# Patient Record
Sex: Male | Born: 1955 | Race: Black or African American | Hispanic: No | Marital: Single | Smoking: Current every day smoker
Health system: Southern US, Community
[De-identification: ages and names within clinical notes are randomized; demographics above are authoritative.]

## PROBLEM LIST (undated history)

## (undated) DIAGNOSIS — F39 Unspecified mood [affective] disorder: Secondary | ICD-10-CM

## (undated) DIAGNOSIS — K219 Gastro-esophageal reflux disease without esophagitis: Secondary | ICD-10-CM

## (undated) DIAGNOSIS — G039 Meningitis, unspecified: Secondary | ICD-10-CM

## (undated) DIAGNOSIS — F4321 Adjustment disorder with depressed mood: Secondary | ICD-10-CM

## (undated) DIAGNOSIS — R195 Other fecal abnormalities: Principal | ICD-10-CM

## (undated) DIAGNOSIS — F1010 Alcohol abuse, uncomplicated: Secondary | ICD-10-CM

## (undated) DIAGNOSIS — B459 Cryptococcosis, unspecified: Secondary | ICD-10-CM

## (undated) DIAGNOSIS — I10 Essential (primary) hypertension: Secondary | ICD-10-CM

## (undated) DIAGNOSIS — R64 Cachexia: Secondary | ICD-10-CM

## (undated) DIAGNOSIS — K649 Unspecified hemorrhoids: Secondary | ICD-10-CM

## (undated) DIAGNOSIS — B20 Human immunodeficiency virus [HIV] disease: Secondary | ICD-10-CM

## (undated) DIAGNOSIS — Z21 Asymptomatic human immunodeficiency virus [HIV] infection status: Secondary | ICD-10-CM

## (undated) DIAGNOSIS — R911 Solitary pulmonary nodule: Secondary | ICD-10-CM

## (undated) DIAGNOSIS — R52 Pain, unspecified: Secondary | ICD-10-CM

## (undated) DIAGNOSIS — B1920 Unspecified viral hepatitis C without hepatic coma: Secondary | ICD-10-CM

## (undated) DIAGNOSIS — K6289 Other specified diseases of anus and rectum: Secondary | ICD-10-CM

## (undated) DIAGNOSIS — F1910 Other psychoactive substance abuse, uncomplicated: Secondary | ICD-10-CM

## (undated) DIAGNOSIS — IMO0002 Reserved for concepts with insufficient information to code with codable children: Secondary | ICD-10-CM

## (undated) DIAGNOSIS — R21 Rash and other nonspecific skin eruption: Secondary | ICD-10-CM

## (undated) DIAGNOSIS — E291 Testicular hypofunction: Secondary | ICD-10-CM

## (undated) DIAGNOSIS — Z923 Personal history of irradiation: Secondary | ICD-10-CM

## (undated) DIAGNOSIS — C801 Malignant (primary) neoplasm, unspecified: Secondary | ICD-10-CM

## (undated) HISTORY — DX: Personal history of irradiation: Z92.3

## (undated) HISTORY — DX: Cryptococcosis, unspecified: B45.9

## (undated) HISTORY — DX: Unspecified viral hepatitis C without hepatic coma: B19.20

## (undated) HISTORY — DX: Unspecified hemorrhoids: K64.9

## (undated) HISTORY — DX: Other specified diseases of anus and rectum: K62.89

## (undated) HISTORY — DX: Human immunodeficiency virus (HIV) disease: B20

## (undated) HISTORY — DX: Unspecified mood (affective) disorder: F39

## (undated) HISTORY — DX: Solitary pulmonary nodule: R91.1

## (undated) HISTORY — DX: Testicular hypofunction: E29.1

## (undated) HISTORY — DX: Other psychoactive substance abuse, uncomplicated: F19.10

## (undated) HISTORY — DX: Alcohol abuse, uncomplicated: F10.10

## (undated) HISTORY — DX: Rash and other nonspecific skin eruption: R21

## (undated) HISTORY — DX: Adjustment disorder with depressed mood: F43.21

## (undated) HISTORY — DX: Meningitis, unspecified: G03.9

## (undated) HISTORY — DX: Reserved for concepts with insufficient information to code with codable children: IMO0002

## (undated) HISTORY — DX: Pain, unspecified: R52

## (undated) HISTORY — DX: Gastro-esophageal reflux disease without esophagitis: K21.9

## (undated) HISTORY — DX: Other fecal abnormalities: R19.5

## (undated) HISTORY — DX: Cachexia: R64

---

## 1999-08-24 ENCOUNTER — Other Ambulatory Visit: Admission: RE | Admit: 1999-08-24 | Discharge: 1999-08-24 | Payer: Self-pay | Admitting: *Deleted

## 2000-12-09 ENCOUNTER — Emergency Department (HOSPITAL_COMMUNITY): Admission: EM | Admit: 2000-12-09 | Discharge: 2000-12-09 | Payer: Self-pay | Admitting: Emergency Medicine

## 2000-12-09 ENCOUNTER — Encounter: Payer: Self-pay | Admitting: Emergency Medicine

## 2003-10-13 ENCOUNTER — Encounter (INDEPENDENT_AMBULATORY_CARE_PROVIDER_SITE_OTHER): Payer: Self-pay | Admitting: *Deleted

## 2003-10-13 LAB — CONVERTED CEMR LAB: CD4 Count: 210 microliters

## 2004-10-12 ENCOUNTER — Ambulatory Visit (HOSPITAL_COMMUNITY): Admission: RE | Admit: 2004-10-12 | Discharge: 2004-10-12 | Payer: Self-pay | Admitting: Infectious Diseases

## 2004-10-12 ENCOUNTER — Ambulatory Visit: Payer: Self-pay | Admitting: Infectious Diseases

## 2004-10-12 ENCOUNTER — Encounter (INDEPENDENT_AMBULATORY_CARE_PROVIDER_SITE_OTHER): Payer: Self-pay | Admitting: *Deleted

## 2004-10-12 LAB — CONVERTED CEMR LAB: HIV 1 RNA Quant: 97100 copies/mL

## 2004-10-25 ENCOUNTER — Ambulatory Visit: Payer: Self-pay | Admitting: Infectious Diseases

## 2004-11-17 ENCOUNTER — Ambulatory Visit: Payer: Self-pay | Admitting: Infectious Diseases

## 2005-04-06 ENCOUNTER — Ambulatory Visit: Payer: Self-pay | Admitting: Infectious Diseases

## 2005-04-06 ENCOUNTER — Encounter (INDEPENDENT_AMBULATORY_CARE_PROVIDER_SITE_OTHER): Payer: Self-pay | Admitting: *Deleted

## 2005-04-06 ENCOUNTER — Ambulatory Visit (HOSPITAL_COMMUNITY): Admission: RE | Admit: 2005-04-06 | Discharge: 2005-04-06 | Payer: Self-pay | Admitting: Infectious Diseases

## 2005-04-06 LAB — CONVERTED CEMR LAB
CD4 Count: 350 microliters
HIV 1 RNA Quant: 399 copies/mL

## 2005-04-07 ENCOUNTER — Encounter: Payer: Self-pay | Admitting: Infectious Diseases

## 2006-07-11 ENCOUNTER — Encounter (INDEPENDENT_AMBULATORY_CARE_PROVIDER_SITE_OTHER): Payer: Self-pay | Admitting: *Deleted

## 2006-07-11 ENCOUNTER — Ambulatory Visit: Payer: Self-pay | Admitting: Infectious Diseases

## 2006-07-11 ENCOUNTER — Encounter: Admission: RE | Admit: 2006-07-11 | Discharge: 2006-07-11 | Payer: Self-pay | Admitting: Infectious Diseases

## 2006-07-11 LAB — CONVERTED CEMR LAB
ALT: 47 units/L (ref 0–53)
AST: 76 units/L — ABNORMAL HIGH (ref 0–37)
Albumin: 3.7 g/dL (ref 3.5–5.2)
Alkaline Phosphatase: 62 units/L (ref 39–117)
BUN: 12 mg/dL (ref 6–23)
Basophils Absolute: 0 10*3/uL (ref 0.0–0.1)
Basophils Relative: 1 % (ref 0–1)
CD4 Count: 210 microliters
CO2: 26 meq/L (ref 19–32)
Calcium: 9.2 mg/dL (ref 8.4–10.5)
Chloride: 106 meq/L (ref 96–112)
Cholesterol: 133 mg/dL (ref 0–200)
Creatinine, Ser: 0.83 mg/dL (ref 0.40–1.50)
Eosinophils Relative: 2 % (ref 0–4)
Glucose, Bld: 94 mg/dL (ref 70–99)
HCT: 47.3 % (ref 41.0–49.0)
HDL: 49 mg/dL (ref >39)
HIV 1 RNA Quant: 29700 copies/mL
HIV 1 RNA Quant: 29700 copies/mL — ABNORMAL HIGH (ref <50)
HIV-1 RNA Quant, Log: 4.47 — ABNORMAL HIGH (ref <1.70)
Hemoglobin: 15.9 g/dL (ref 13.9–16.8)
LDL Cholesterol: 67 mg/dL (ref 0–99)
Lymphocytes Relative: 50 % — ABNORMAL HIGH (ref 15–43)
Lymphs Abs: 1.6 10*3/uL (ref 0.8–3.1)
MCHC: 33.6 g/dL (ref 33.1–35.4)
MCV: 87.4 fL (ref 78.8–100.0)
Monocytes Absolute: 0.5 10*3/uL (ref 0.2–0.7)
Monocytes Relative: 14 % — ABNORMAL HIGH (ref 3–11)
Neutro Abs: 1.1 10*3/uL — ABNORMAL LOW (ref 1.8–6.8)
Neutrophils Relative %: 34 % — ABNORMAL LOW (ref 47–77)
Platelets: 262 10*3/uL (ref 152–374)
Potassium: 4.4 meq/L (ref 3.5–5.3)
RBC: 5.41 M/uL (ref 4.20–5.50)
RDW: 14.4 % (ref 11.5–15.3)
Sodium: 138 meq/L (ref 135–145)
Total Bilirubin: 0.4 mg/dL (ref 0.3–1.2)
Total CHOL/HDL Ratio: 2.7
Total Protein: 8.7 g/dL — ABNORMAL HIGH (ref 6.0–8.3)
Triglycerides: 85 mg/dL (ref <150)
VLDL: 17 mg/dL (ref 0–40)
WBC: 3.3 10*3/uL — ABNORMAL LOW (ref 3.7–10.0)

## 2006-08-18 DIAGNOSIS — B20 Human immunodeficiency virus [HIV] disease: Secondary | ICD-10-CM | POA: Insufficient documentation

## 2006-08-18 DIAGNOSIS — B1710 Acute hepatitis C without hepatic coma: Secondary | ICD-10-CM | POA: Insufficient documentation

## 2006-08-18 DIAGNOSIS — F1910 Other psychoactive substance abuse, uncomplicated: Secondary | ICD-10-CM | POA: Insufficient documentation

## 2006-08-30 ENCOUNTER — Ambulatory Visit: Payer: Self-pay | Admitting: Infectious Diseases

## 2006-10-01 ENCOUNTER — Encounter (INDEPENDENT_AMBULATORY_CARE_PROVIDER_SITE_OTHER): Payer: Self-pay | Admitting: *Deleted

## 2006-10-01 LAB — CONVERTED CEMR LAB: HCV Quantitative: 4750000 intl units/mL

## 2007-08-06 ENCOUNTER — Encounter (INDEPENDENT_AMBULATORY_CARE_PROVIDER_SITE_OTHER): Payer: Self-pay | Admitting: *Deleted

## 2008-08-11 ENCOUNTER — Emergency Department (HOSPITAL_COMMUNITY): Admission: EM | Admit: 2008-08-11 | Discharge: 2008-08-11 | Payer: Self-pay | Admitting: Emergency Medicine

## 2008-08-11 IMAGING — CR DG CHEST 2V
2 series · 2 of 2 positions shown · non-contrast
Comparison: None

CLINICAL DATA: Short of breath, cough, vomiting, history hepatitis
C and H of the

CHEST - 2 VIEW

[w chest pa]
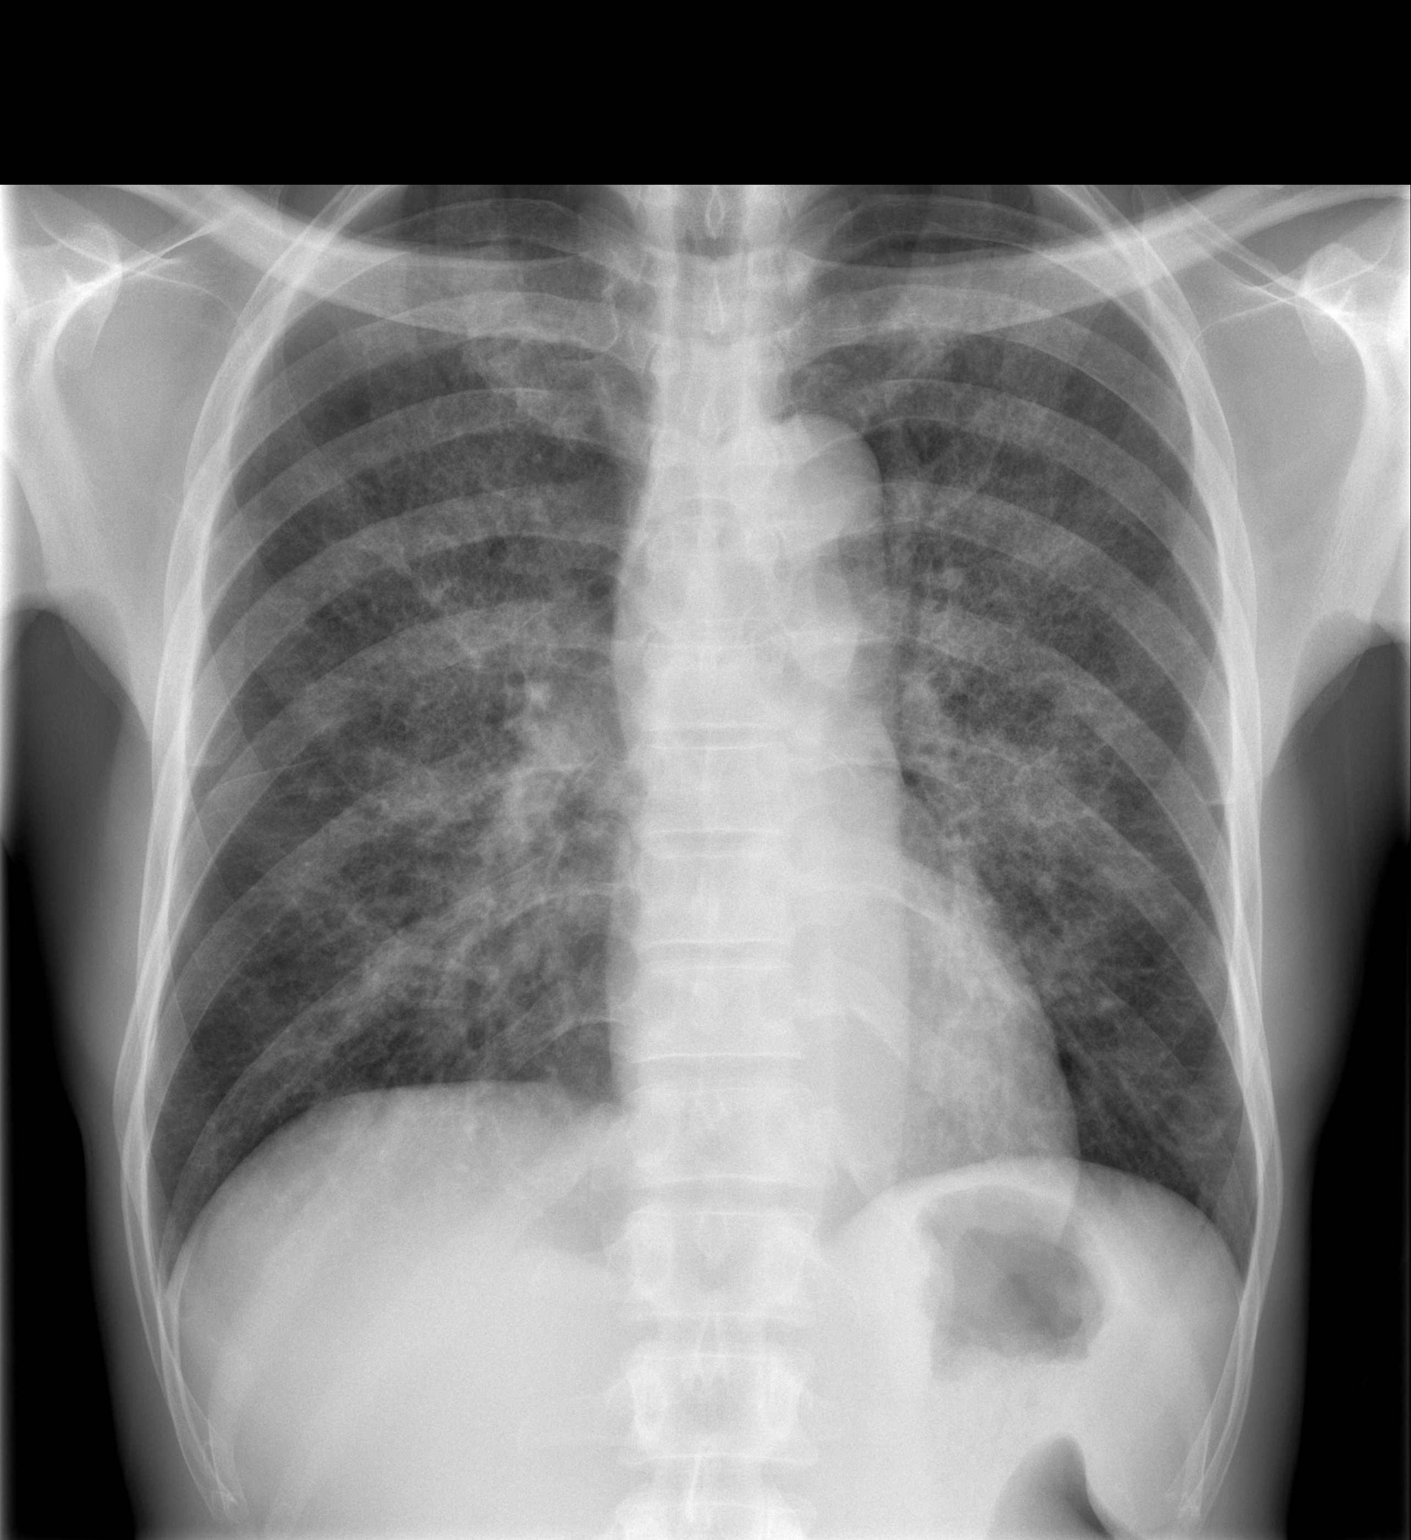

[w chest lat]
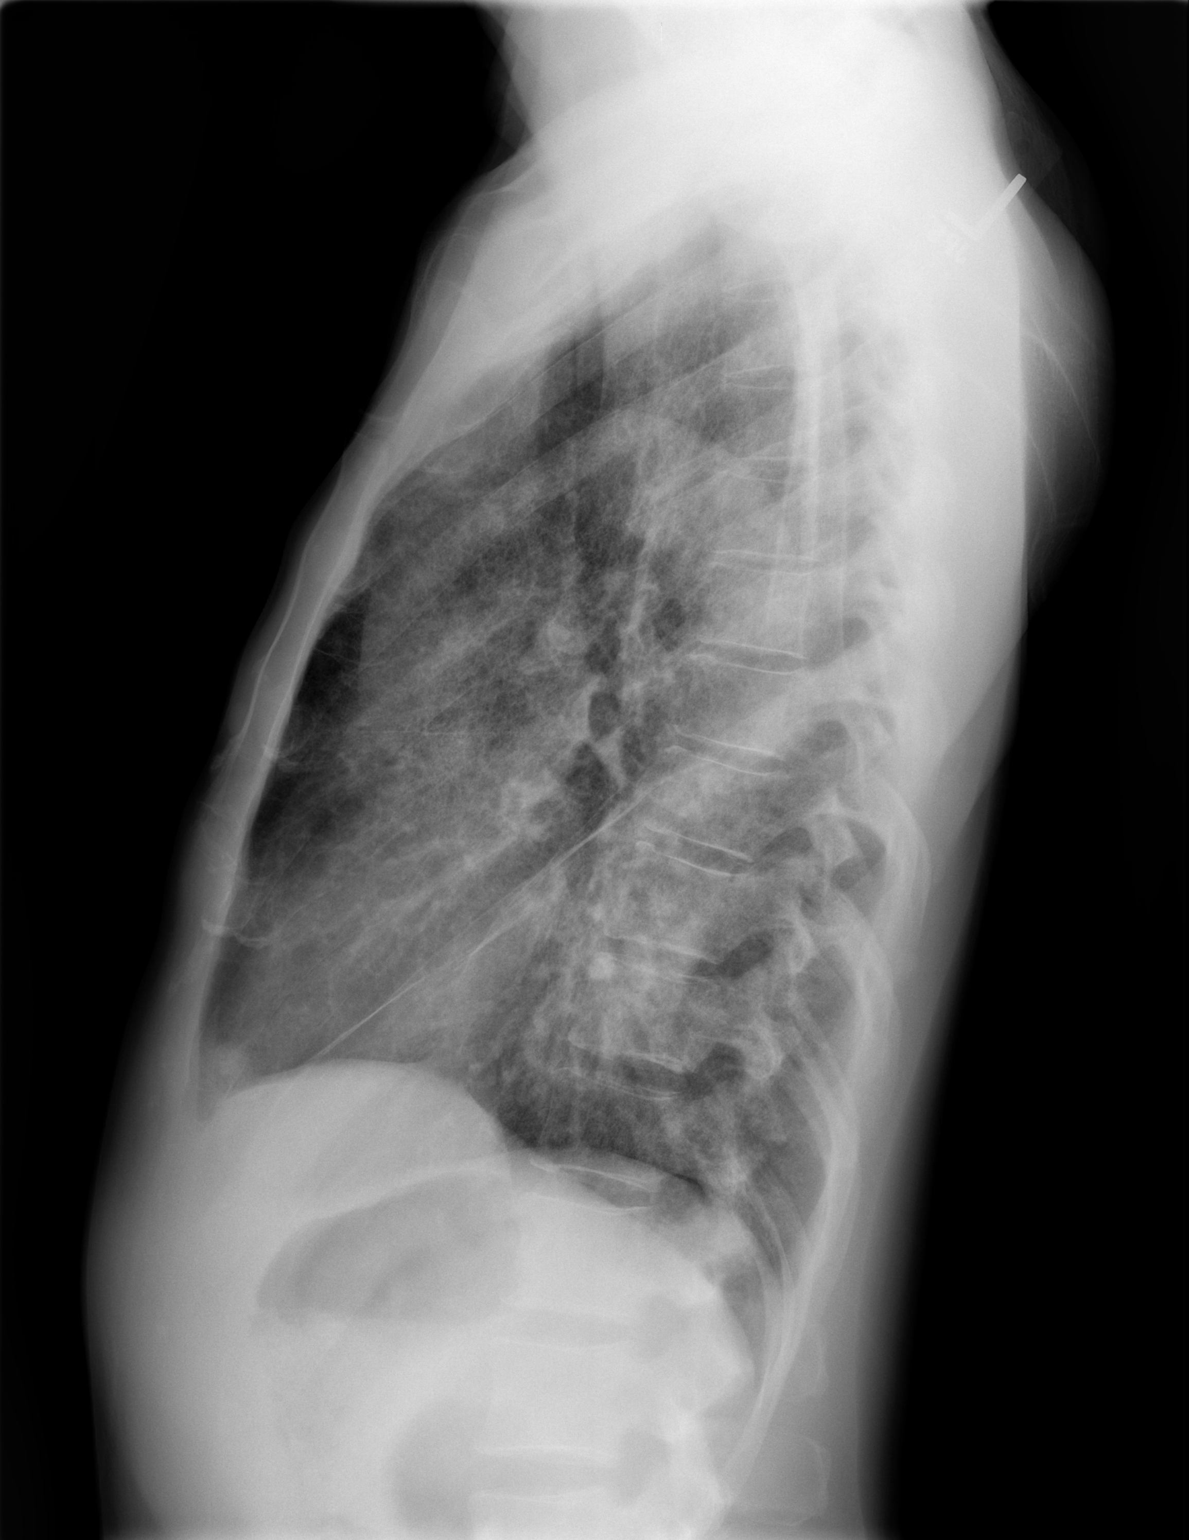

[2 of 2 positions shown; findings below may reference images not displayed]

FINDINGS: There are prominent interstitial markings primarily
perihilar in location.  This could represent atypical and possibly
opportunistic pneumonia, such as Pneumocystis carinii. No effusion
is seen.  Heart is within normal limits in size.  No bony
abnormality is noted.
IMPRESSION: Prominent primarily perihilar interstitial markings suspicious for
atypical or opportunistic pneumonia such as Pneumocystis carinii.

## 2008-08-26 ENCOUNTER — Ambulatory Visit: Payer: Self-pay | Admitting: Internal Medicine

## 2008-08-26 LAB — CONVERTED CEMR LAB
ALT: 19 units/L (ref 0–53)
AST: 28 units/L (ref 0–37)
Albumin: 3 g/dL — ABNORMAL LOW (ref 3.5–5.2)
Alkaline Phosphatase: 77 units/L (ref 39–117)
BUN: 9 mg/dL (ref 6–23)
Bacteria, UA: NONE SEEN
Basophils Absolute: 0 10*3/uL (ref 0.0–0.1)
Basophils Relative: 1 % (ref 0–1)
Bilirubin Urine: NEGATIVE
CO2: 21 meq/L (ref 19–32)
Calcium: 8.2 mg/dL — ABNORMAL LOW (ref 8.4–10.5)
Chlamydia, Swab/Urine, PCR: NEGATIVE
Chloride: 102 meq/L (ref 96–112)
Cholesterol: 106 mg/dL (ref 0–200)
Creatinine, Ser: 0.87 mg/dL (ref 0.40–1.50)
Eosinophils Absolute: 0 10*3/uL (ref 0.0–0.7)
Eosinophils Relative: 0 % (ref 0–5)
GC Probe Amp, Urine: NEGATIVE
Glucose, Bld: 101 mg/dL — ABNORMAL HIGH (ref 70–99)
HCT: 41.2 % (ref 39.0–52.0)
HDL: 40 mg/dL (ref >39)
HIV 1 RNA Quant: 200000 copies/mL — ABNORMAL HIGH (ref <48)
HIV-1 RNA Quant, Log: 5.3 — ABNORMAL HIGH (ref <1.68)
Hemoglobin, Urine: NEGATIVE
Hemoglobin: 13.5 g/dL (ref 13.0–17.0)
Ketones, ur: NEGATIVE mg/dL
LDL Cholesterol: 47 mg/dL (ref 0–99)
Leukocytes, UA: NEGATIVE
Lymphocytes Relative: 25 % (ref 12–46)
Lymphs Abs: 1 10*3/uL (ref 0.7–4.0)
MCHC: 32.8 g/dL (ref 30.0–36.0)
MCV: 86.2 fL (ref 78.0–100.0)
Monocytes Absolute: 0.9 10*3/uL (ref 0.1–1.0)
Monocytes Relative: 21 % — ABNORMAL HIGH (ref 3–12)
Neutro Abs: 2.1 10*3/uL (ref 1.7–7.7)
Neutrophils Relative %: 53 % (ref 43–77)
Nitrite: NEGATIVE
Platelets: 398 10*3/uL (ref 150–400)
Potassium: 4 meq/L (ref 3.5–5.3)
Protein, ur: 30 mg/dL — AB
RBC / HPF: NONE SEEN (ref <3)
RBC: 4.78 M/uL (ref 4.22–5.81)
RDW: 13.3 % (ref 11.5–15.5)
Sodium: 134 meq/L — ABNORMAL LOW (ref 135–145)
Specific Gravity, Urine: 1.019 (ref 1.005–1.03)
Total Bilirubin: 0.4 mg/dL (ref 0.3–1.2)
Total CHOL/HDL Ratio: 2.7
Total Protein: 8 g/dL (ref 6.0–8.3)
Triglycerides: 97 mg/dL (ref <150)
Urine Glucose: NEGATIVE mg/dL
Urobilinogen, UA: 1 (ref 0.0–1.0)
VLDL: 19 mg/dL (ref 0–40)
WBC: 4 10*3/uL (ref 4.0–10.5)
pH: 6 (ref 5.0–8.0)

## 2008-08-27 ENCOUNTER — Ambulatory Visit: Payer: Self-pay | Admitting: Cardiology

## 2008-08-27 ENCOUNTER — Encounter: Payer: Self-pay | Admitting: Emergency Medicine

## 2008-08-27 ENCOUNTER — Ambulatory Visit: Payer: Self-pay | Admitting: Infectious Disease

## 2008-08-27 ENCOUNTER — Inpatient Hospital Stay (HOSPITAL_COMMUNITY): Admission: AD | Admit: 2008-08-27 | Discharge: 2008-09-12 | Payer: Self-pay | Admitting: *Deleted

## 2008-08-27 ENCOUNTER — Ambulatory Visit: Payer: Self-pay | Admitting: *Deleted

## 2008-08-27 ENCOUNTER — Ambulatory Visit: Payer: Self-pay | Admitting: Cardiovascular Disease

## 2008-08-27 IMAGING — CR DG ABDOMEN 2V
2 series · 2 of 2 positions shown · non-contrast
Comparison: None

CLINICAL DATA: Abdominal pain, nausea, diarrhea

ABDOMEN - 2 VIEW

[w abdomen upright]
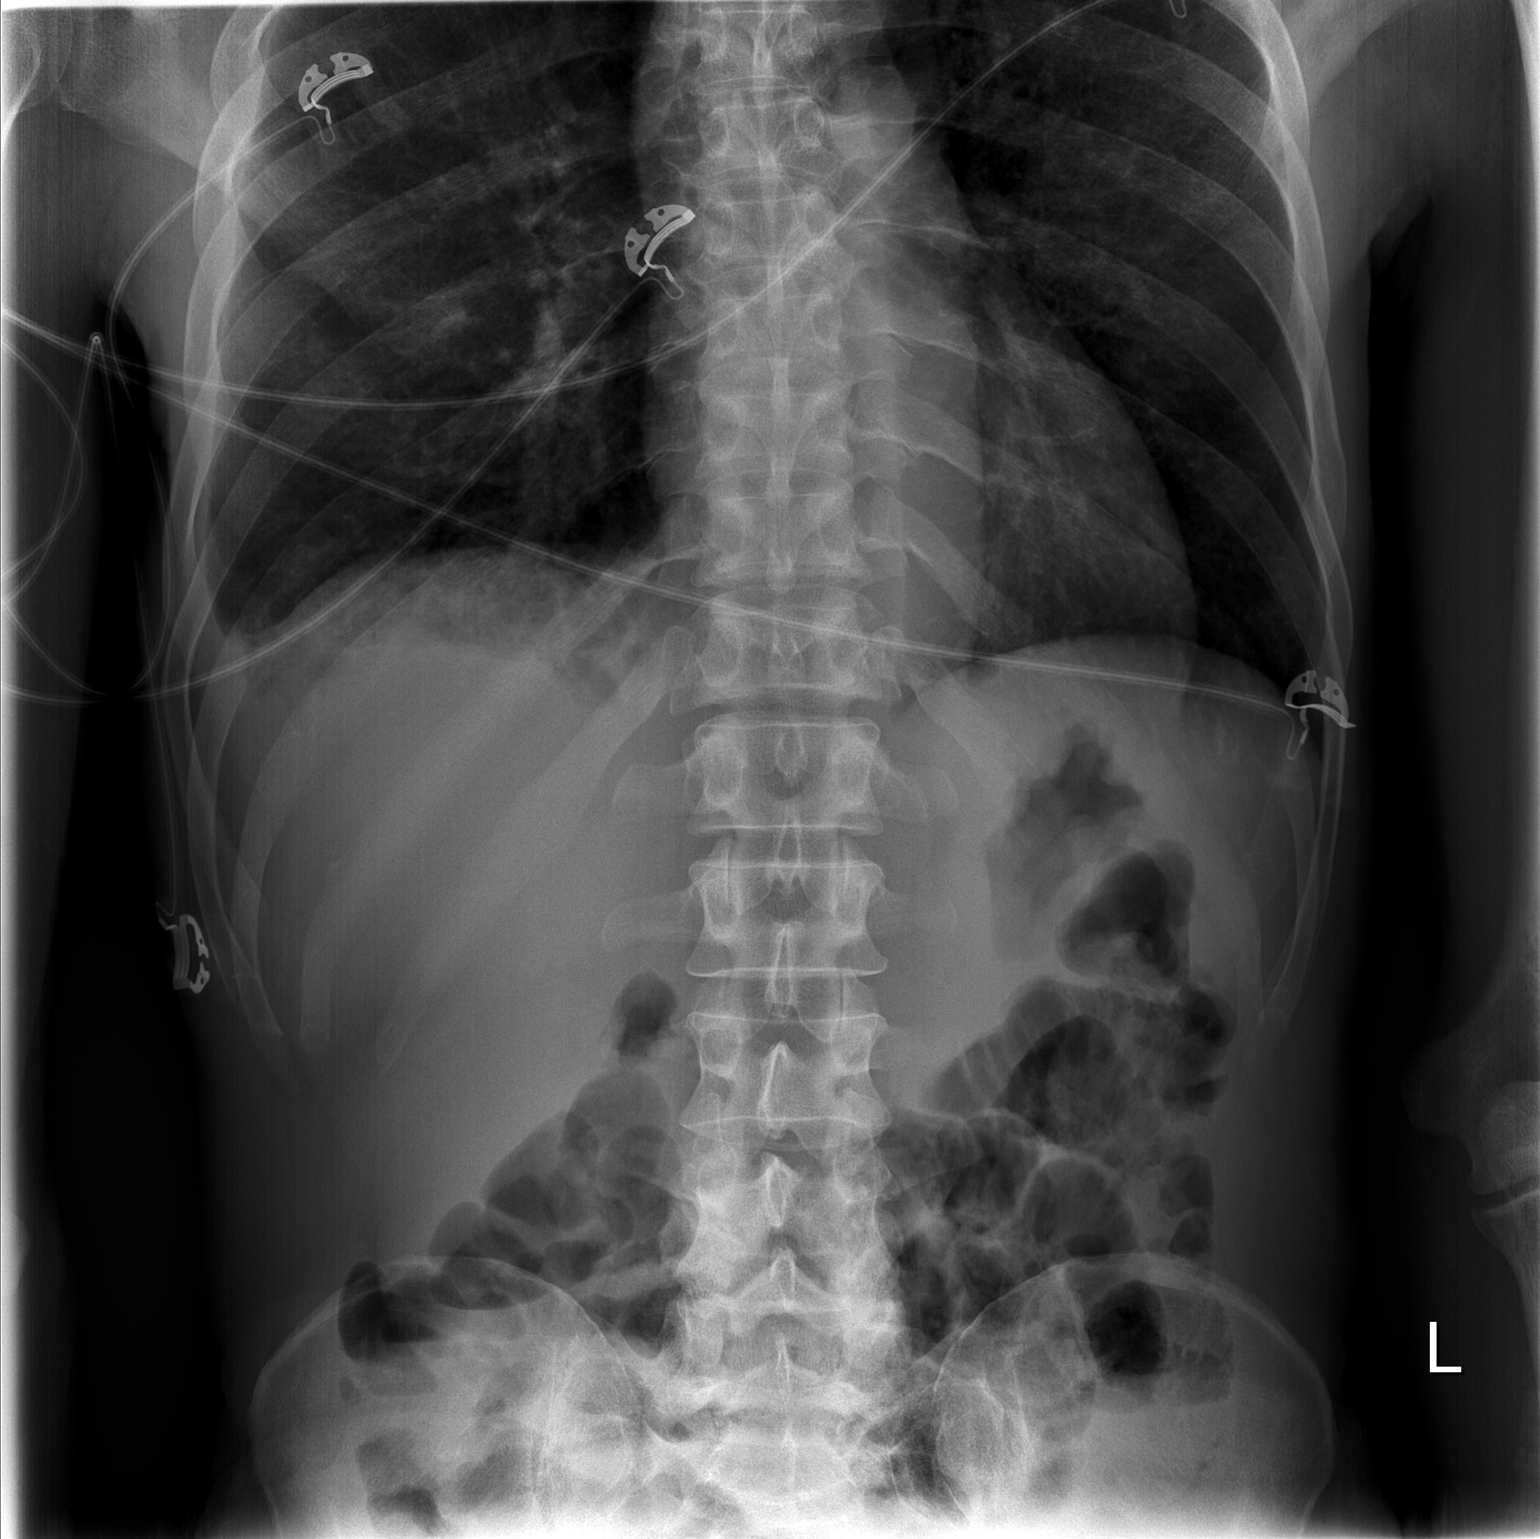

[t abdomen supine]
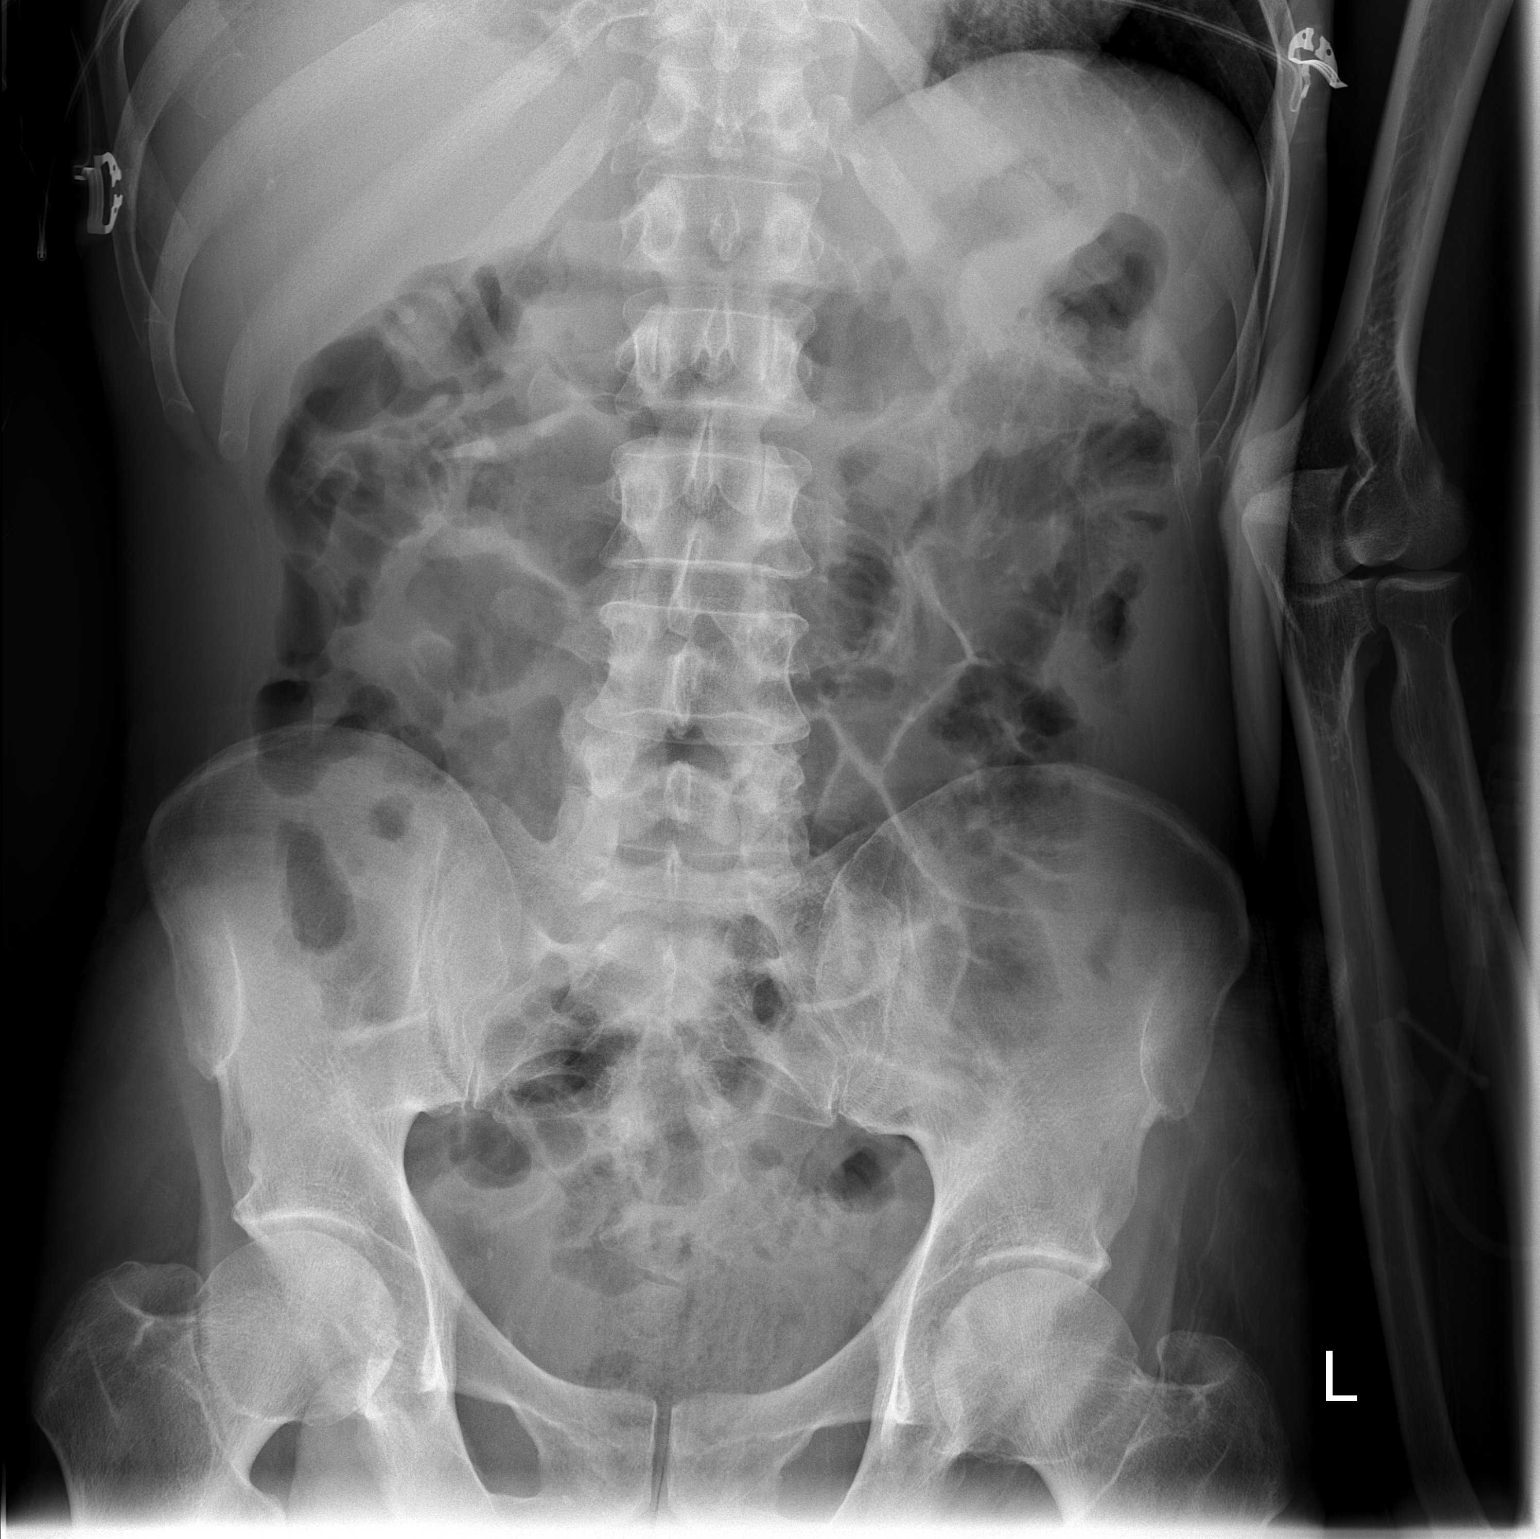

[2 of 2 positions shown; findings below may reference images not displayed]

FINDINGS: Air filled loops of nondistended large and small bowel throughout
abdomen.
No bowel dilatation, bowel wall thickening, or evidence of
obstruction.
Lung bases clear.
No free air.
Bones unremarkable.
No urinary tract calcification.
IMPRESSION: Nonspecific bowel gas pattern.

## 2008-08-27 IMAGING — CR DG CHEST 2V
2 series · 2 of 2 positions shown · non-contrast
Comparison: Chest radiograph from [DATE]

CLINICAL DATA: 52-year-old with cough and pneumonia

CHEST - 2 VIEW

[w chest pa]
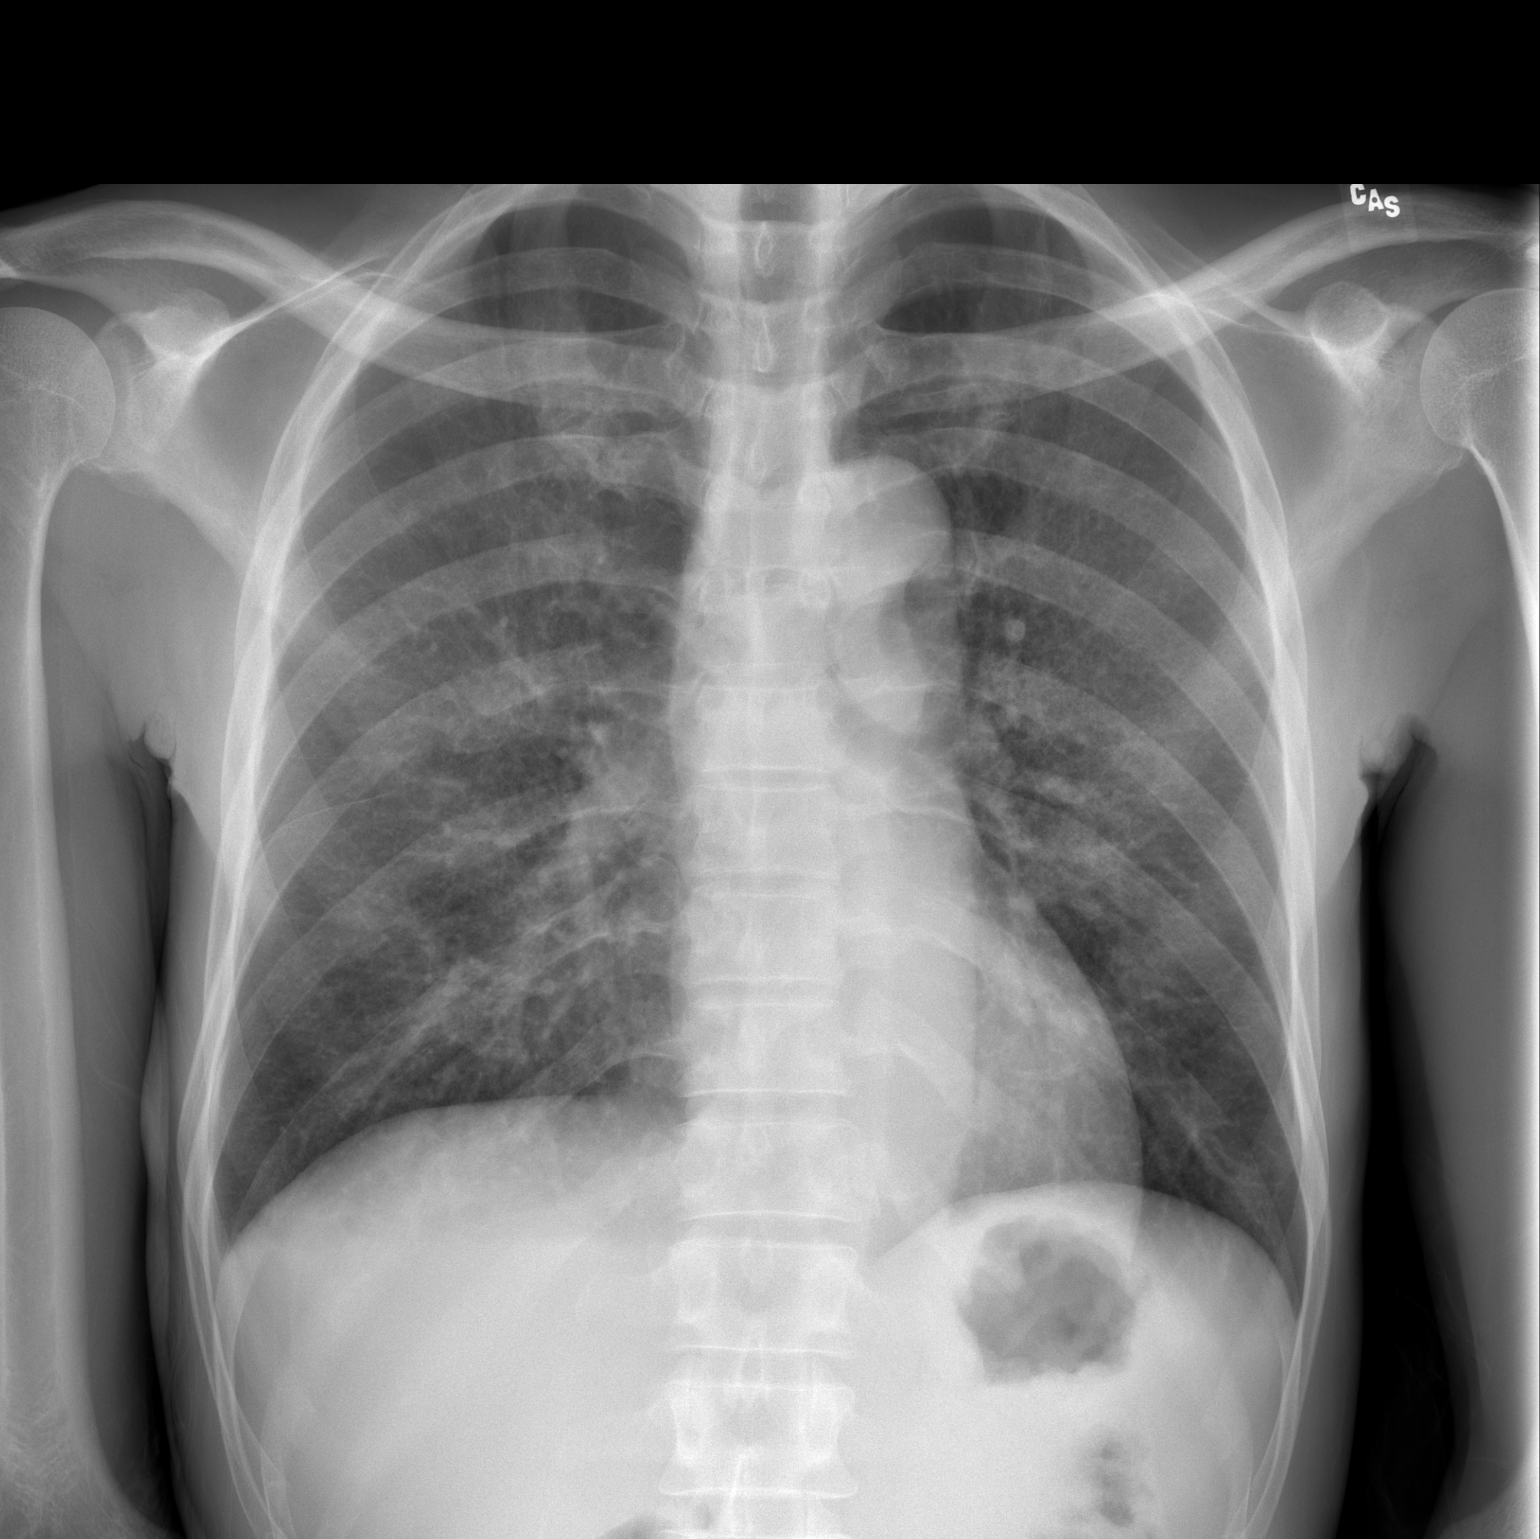

[w chest lat]
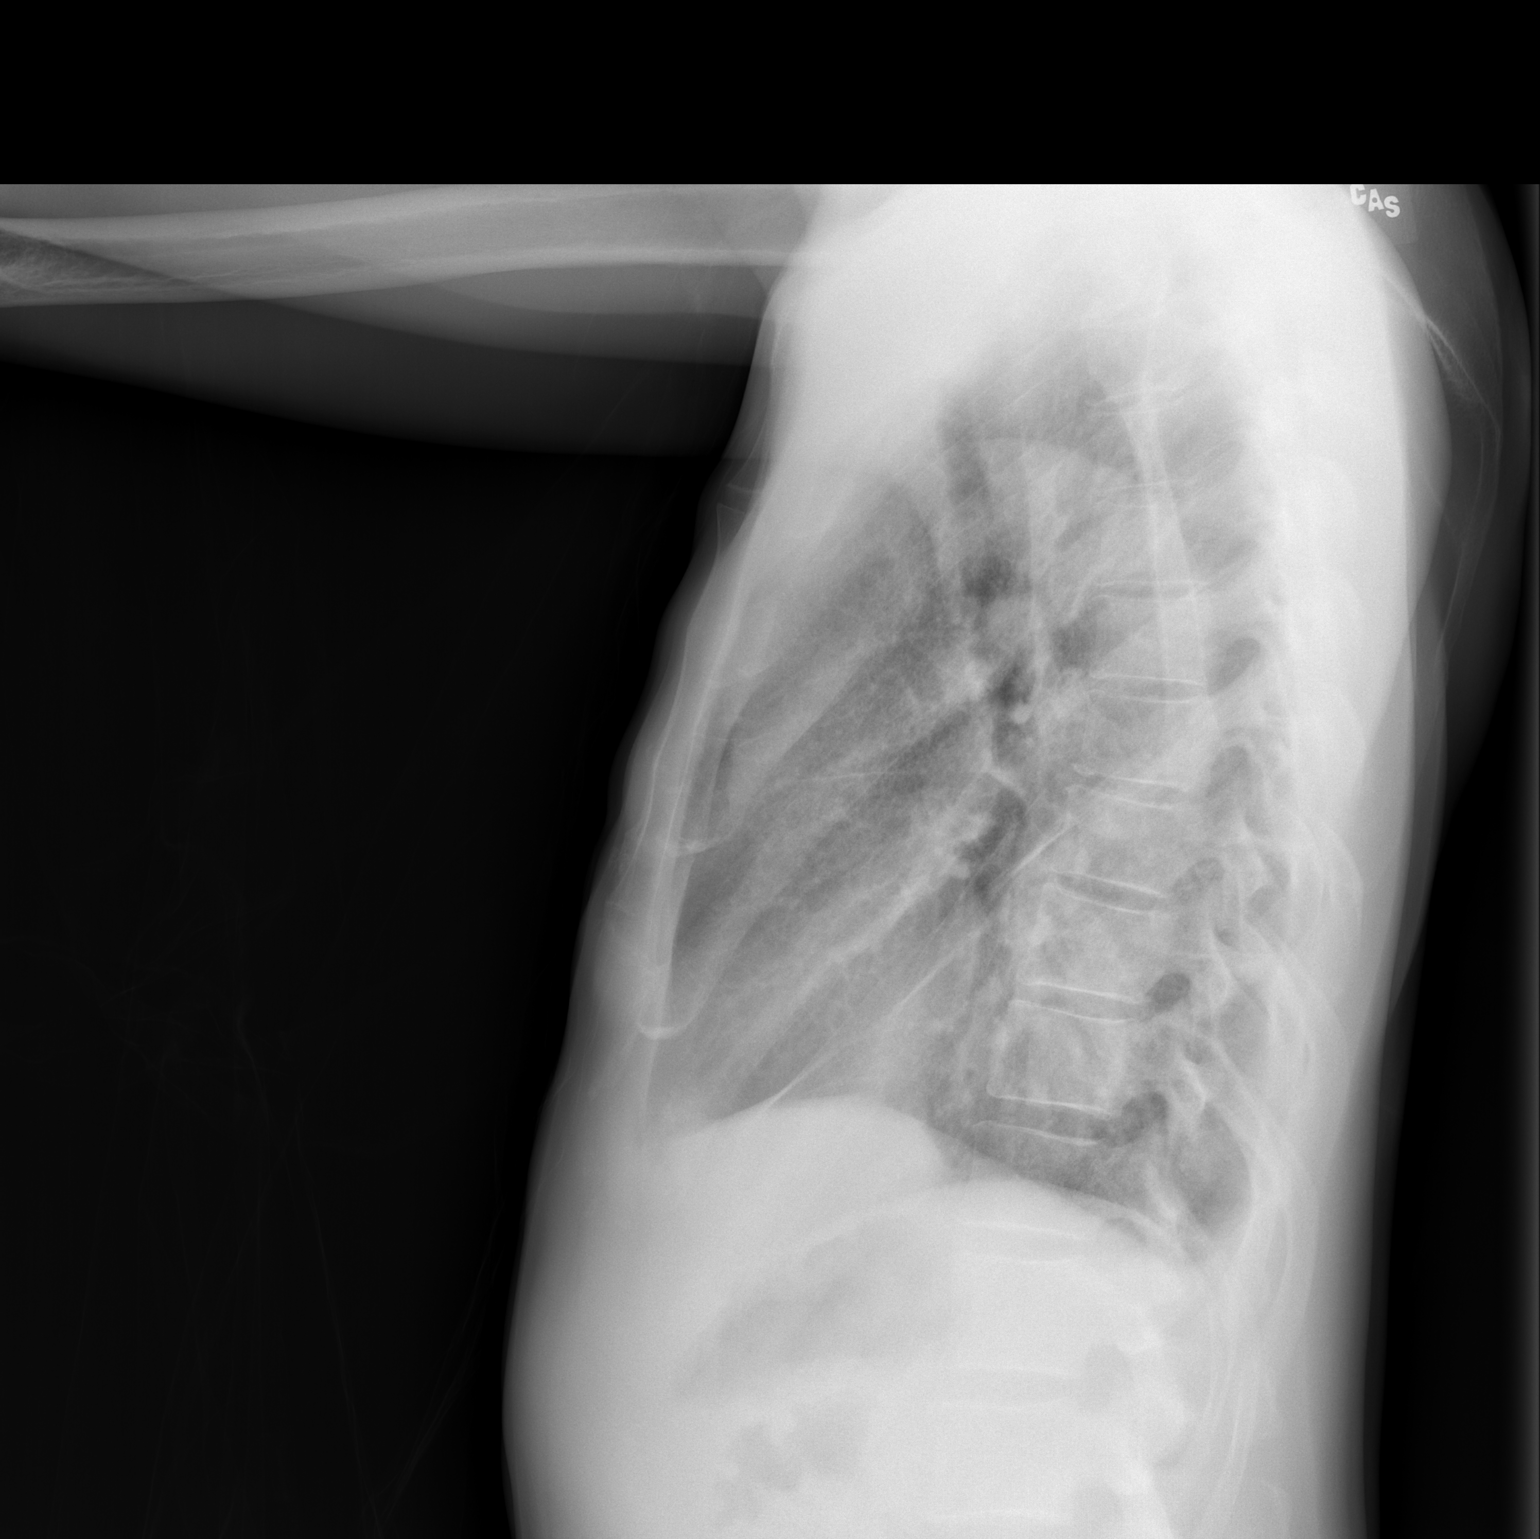

[2 of 2 positions shown; findings below may reference images not displayed]

FINDINGS: Two views of the chest were obtained.  The patient
continues to have a hazy interstitial lung pattern.  There is
slightly improved aeration of the lungs compared to the prior
examination.  There is no focal area of consolidation.  The heart
and mediastinum are stable.  No evidence to suggest pleural
effusions.
IMPRESSION: Persistent interstitial opacities concerning for atypical
infection. There is slightly improved aeration of the lungs
compared to [DATE].

## 2008-08-28 ENCOUNTER — Telehealth: Payer: Self-pay | Admitting: Internal Medicine

## 2008-08-28 ENCOUNTER — Encounter: Payer: Self-pay | Admitting: Internal Medicine

## 2008-08-28 ENCOUNTER — Encounter (INDEPENDENT_AMBULATORY_CARE_PROVIDER_SITE_OTHER): Payer: Self-pay | Admitting: *Deleted

## 2008-09-04 IMAGING — CT CT HEAD W/O CM
1 of 2 series · 13 of 30 positions shown, 17 images · non-contrast
Comparison: None

CLINICAL DATA: HIV positive.  Possible meningitis.  Evaluate for
intracranial mass or hydrocephalus prior to lumbar puncture.

CT HEAD WITHOUT CONTRAST
TECHNIQUE: Contiguous axial images were obtained from the base of
the skull through the vertex without contrast.

[Series 2: brain · axial · 0.47mm/px · z∈[+148,+278]mm · 13 of 32 slices shown, 17 images]
[im 3/32  brain]
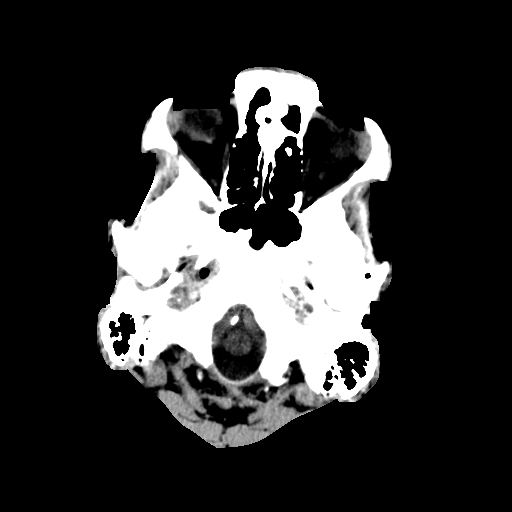
[im 3/32  bone]
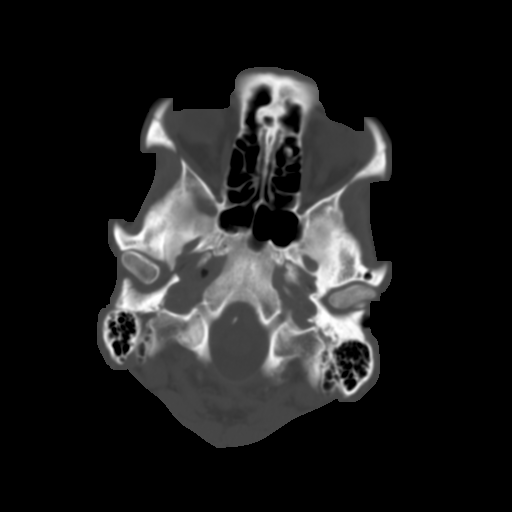
[im 5/32  brain]
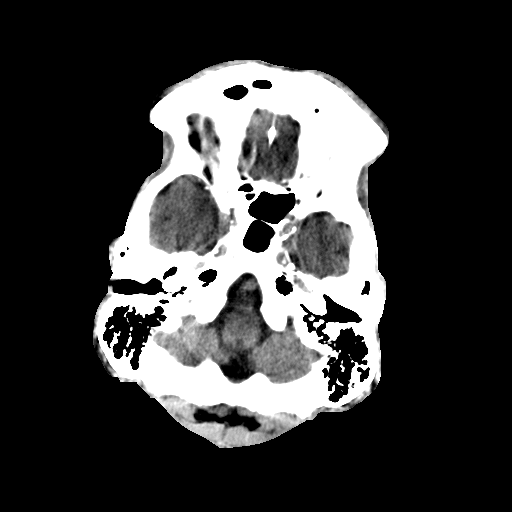
[im 7/32  brain]
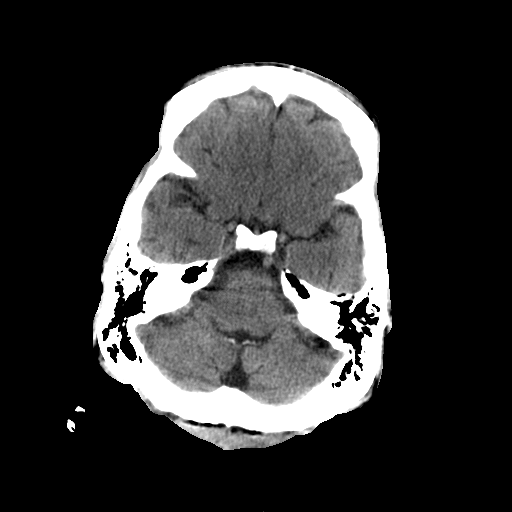
[im 9/32  brain]
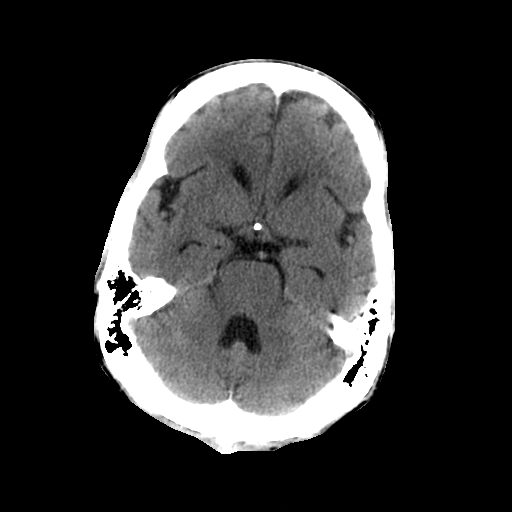
[im 12/32  brain]
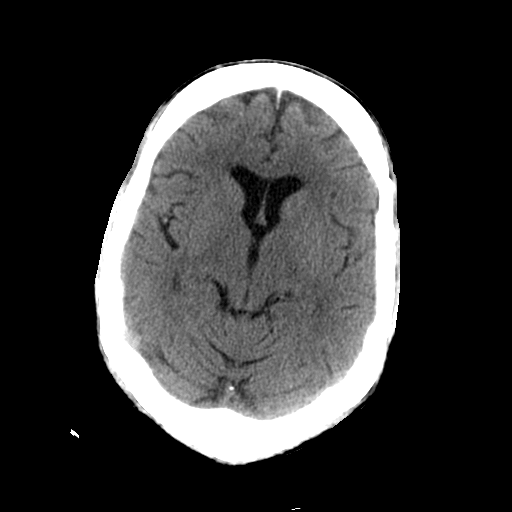
[im 12/32  bone]
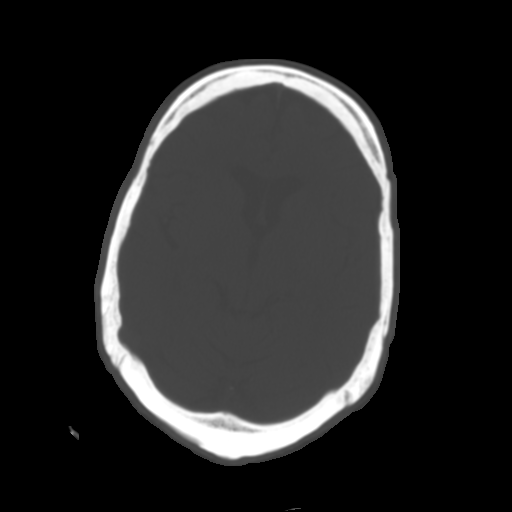
[im 14/32  brain]
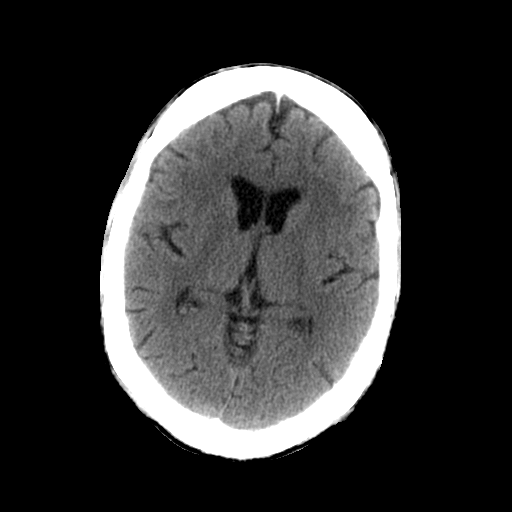
[im 16/32  brain]
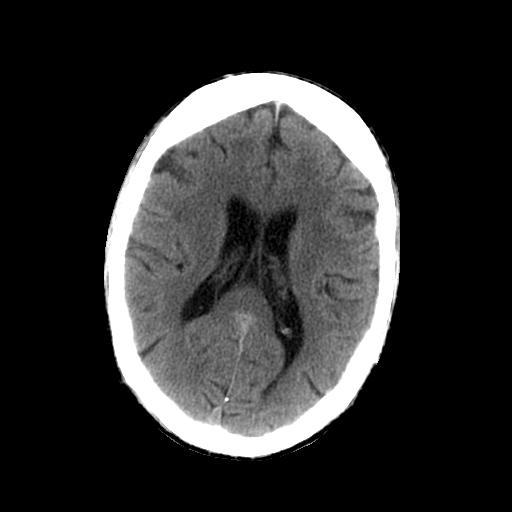
[im 18/32  brain]
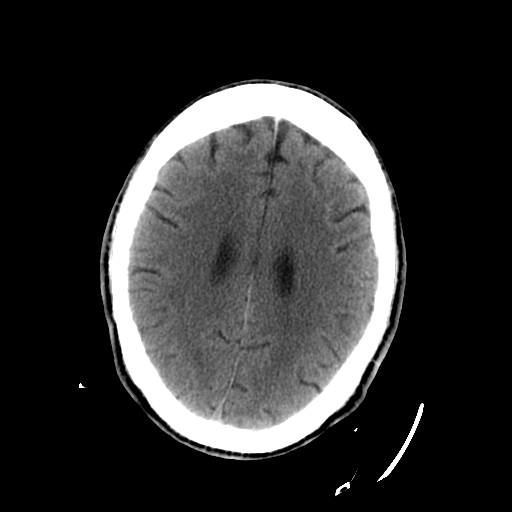
[im 20/32  brain]
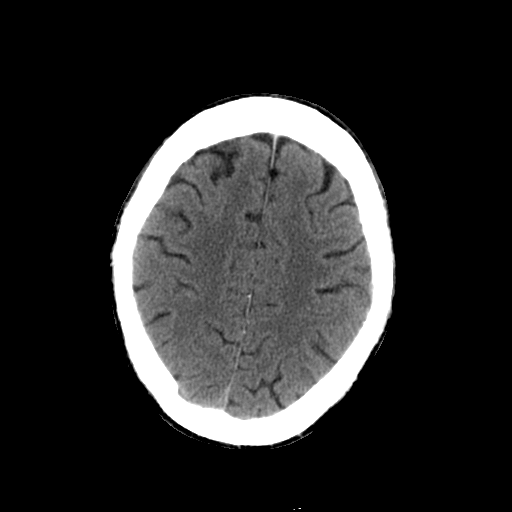
[im 20/32  bone]
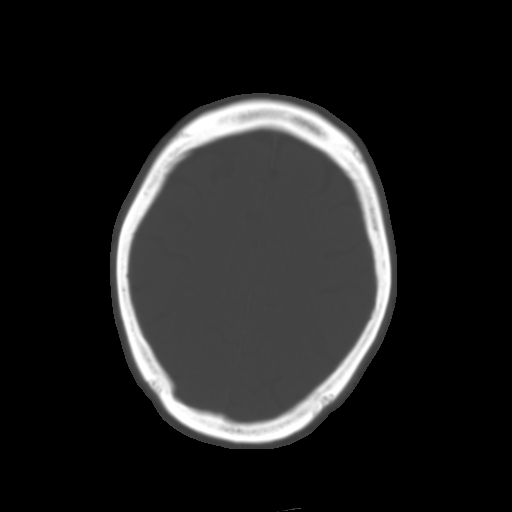
[im 23/32  brain]
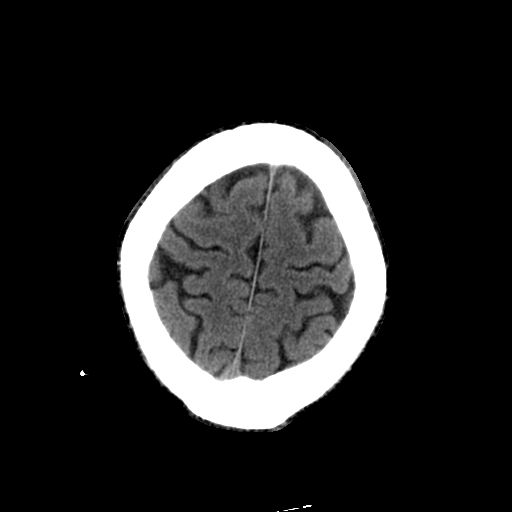
[im 25/32  brain]
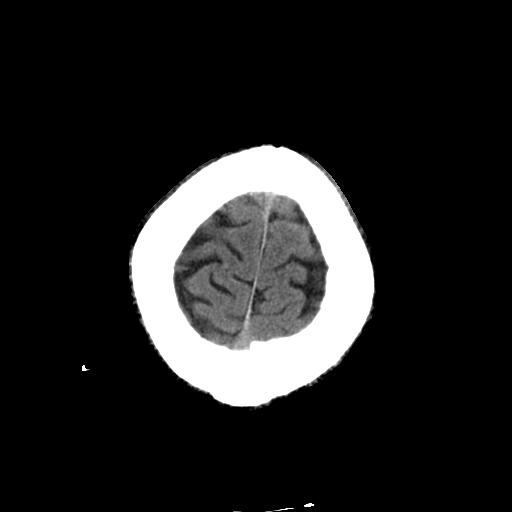
[im 27/32  brain]
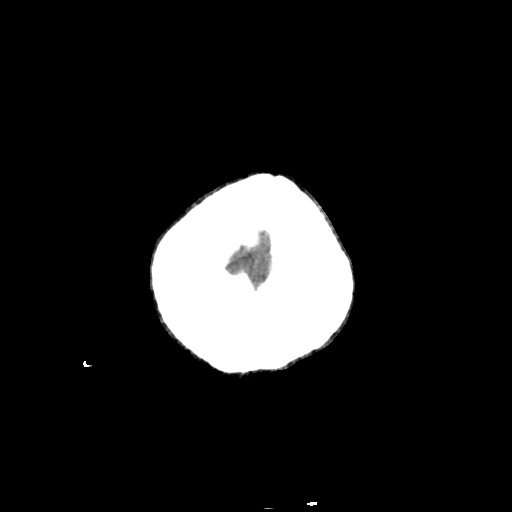
[im 29/32  brain]
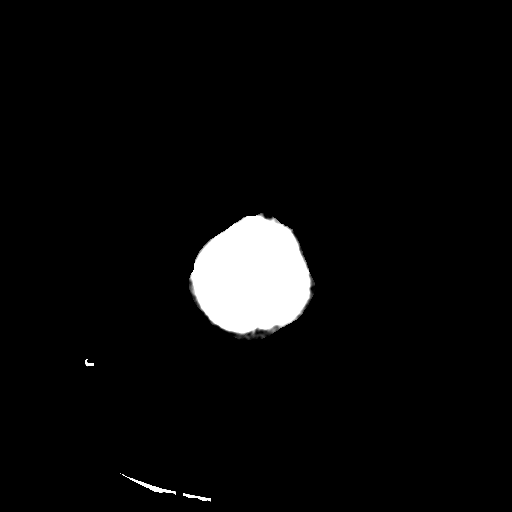
[im 29/32  bone]
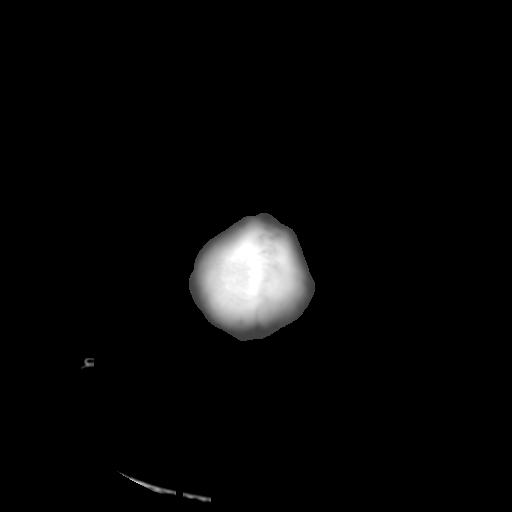

[13 of 30 positions shown; findings below may reference images not displayed]

FINDINGS: There is no evidence of intracranial hemorrhage, brain
edema, other signs of acute infarct.  There is no evidence of
intracranial mass lesions or mass effect.  No abnormal extra-axial
fluid collections are seen.  There is no evidence of hydrocephalus.
No skull abnormality identified.
IMPRESSION: No acute intracranial findings.  No evidence of hydrocephalus or
intracranial mass.

## 2008-09-05 ENCOUNTER — Other Ambulatory Visit: Payer: Self-pay | Admitting: Internal Medicine

## 2008-09-05 IMAGING — RF DG FLUORO GUIDE NDL PLC/BX
1 series · 1 of 1 positions shown · non-contrast
Comparison: none

CLINICAL DATA: Cryptococcal antigen is positive

[Series 1: run · 1 of 1 slices shown]
[im 1/1]
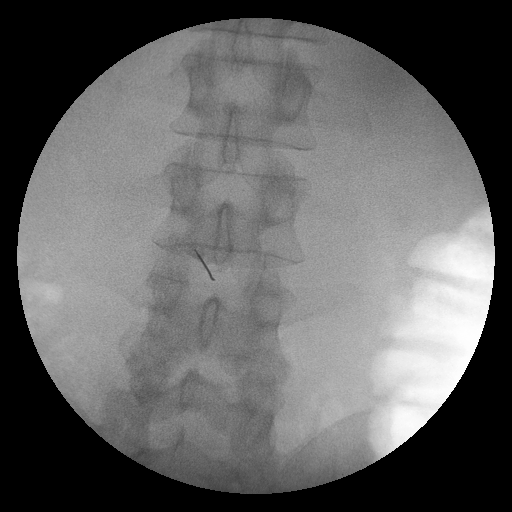

[1 of 1 positions shown; findings below may reference images not displayed]

FLUORO GUIDED NEEDLE PLACEMENT

Procedure: The low back was prepped and draped in a sterile
fashion.  Lidocaine was utilized for local anesthesia.  Under
fluoroscopic guidance, a 20 gauge spinal needle was inserted into
the CSF space via left L3-4 approach.  Opening pressure is measured
at 12 cm water.  6 ml clear CSF was obtained.
FINDINGS: Needle placement in the CSF space on the left at L3-4 is
documented.
IMPRESSION: Successful lumbar puncture as described.

## 2008-09-06 ENCOUNTER — Ambulatory Visit: Payer: Self-pay | Admitting: Infectious Diseases

## 2008-09-07 ENCOUNTER — Other Ambulatory Visit: Payer: Self-pay | Admitting: Internal Medicine

## 2008-09-09 ENCOUNTER — Telehealth: Payer: Self-pay | Admitting: Infectious Disease

## 2008-09-09 IMAGING — CR DG CHEST 1V PORT
1 series · 1 of 1 positions shown · non-contrast
Comparison: [DATE]

CLINICAL DATA: PICC line repositioned.

PORTABLE CHEST - 1 VIEW

[view not recorded]
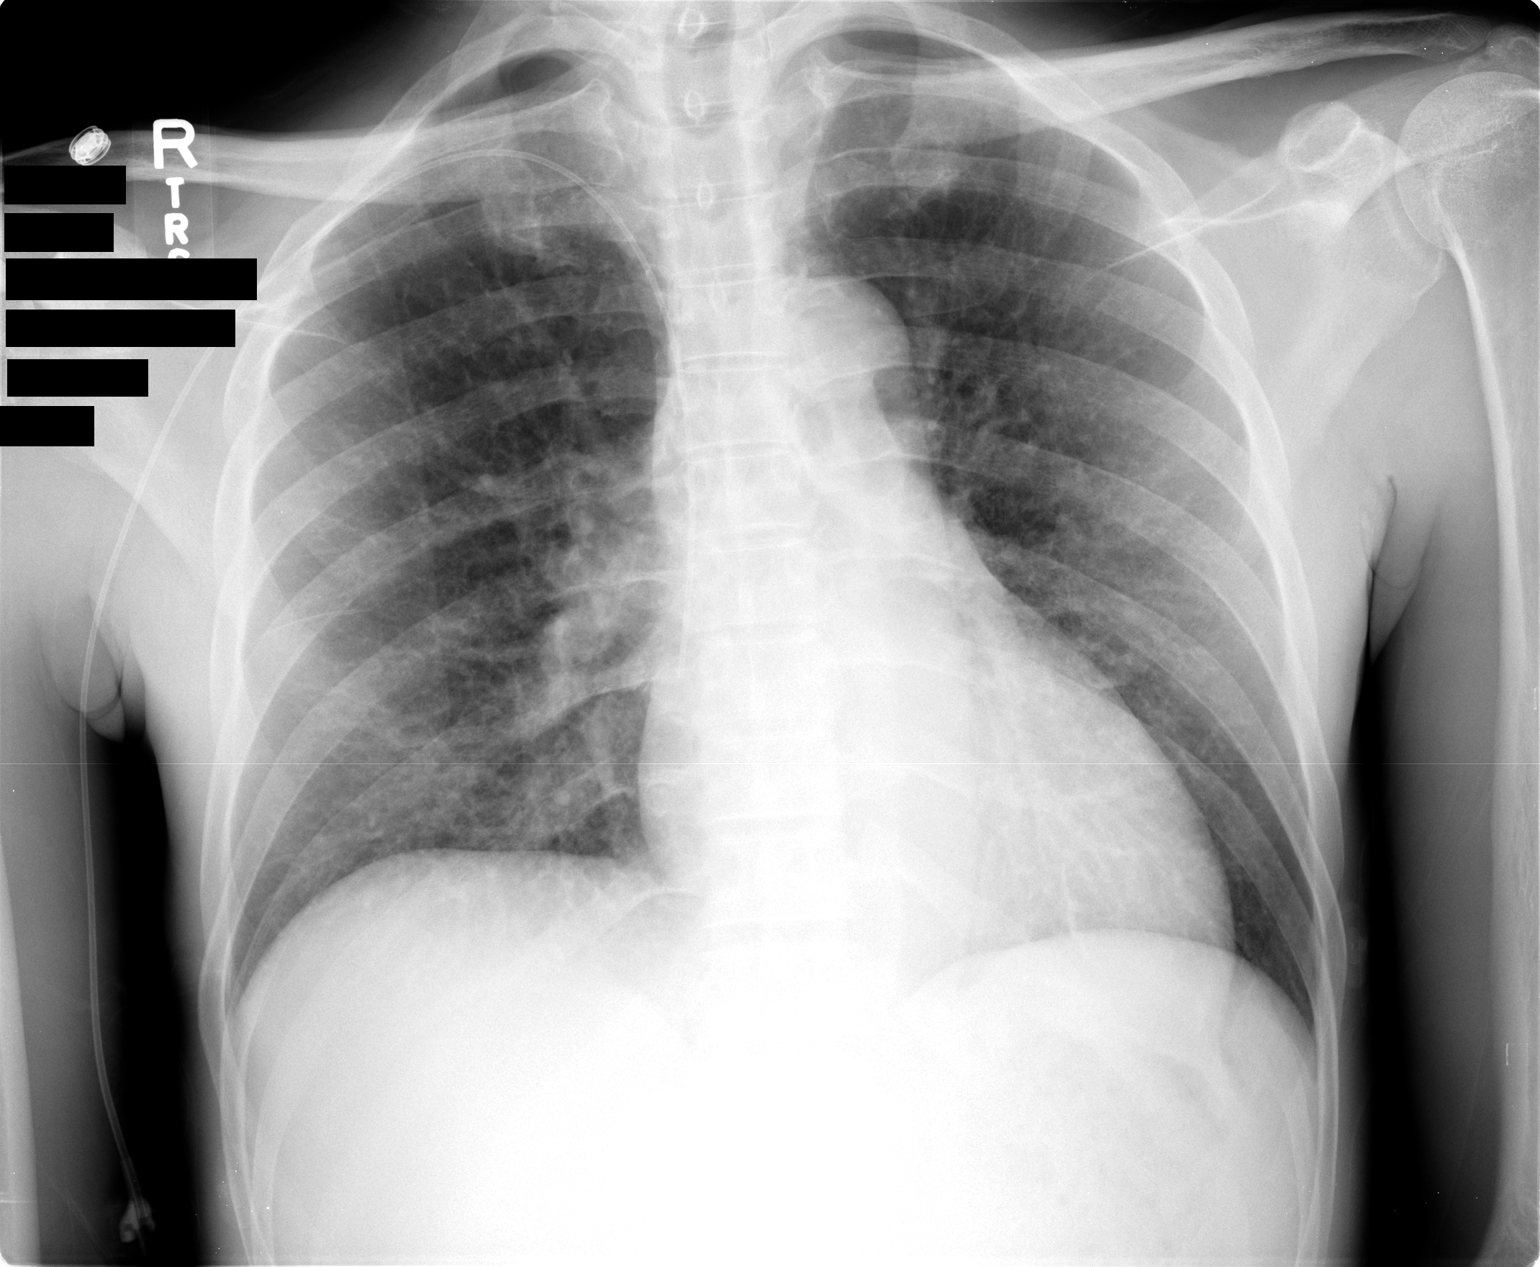

[1 of 1 positions shown; findings below may reference images not displayed]

FINDINGS: Right PICC line is in place.  The tip is at the
cavoatrial junction.  Otherwise no significant change since prior
study.
IMPRESSION: Right PICC line tip cavoatrial junction.

REF:W2 DICTATED: [DATE] [DATE]

## 2008-09-09 IMAGING — CR DG CHEST 1V PORT
1 series · 1 of 1 positions shown · non-contrast
Comparison: [DATE]

CLINICAL DATA: PICC line

PORTABLE CHEST - 1 VIEW

[view not recorded]
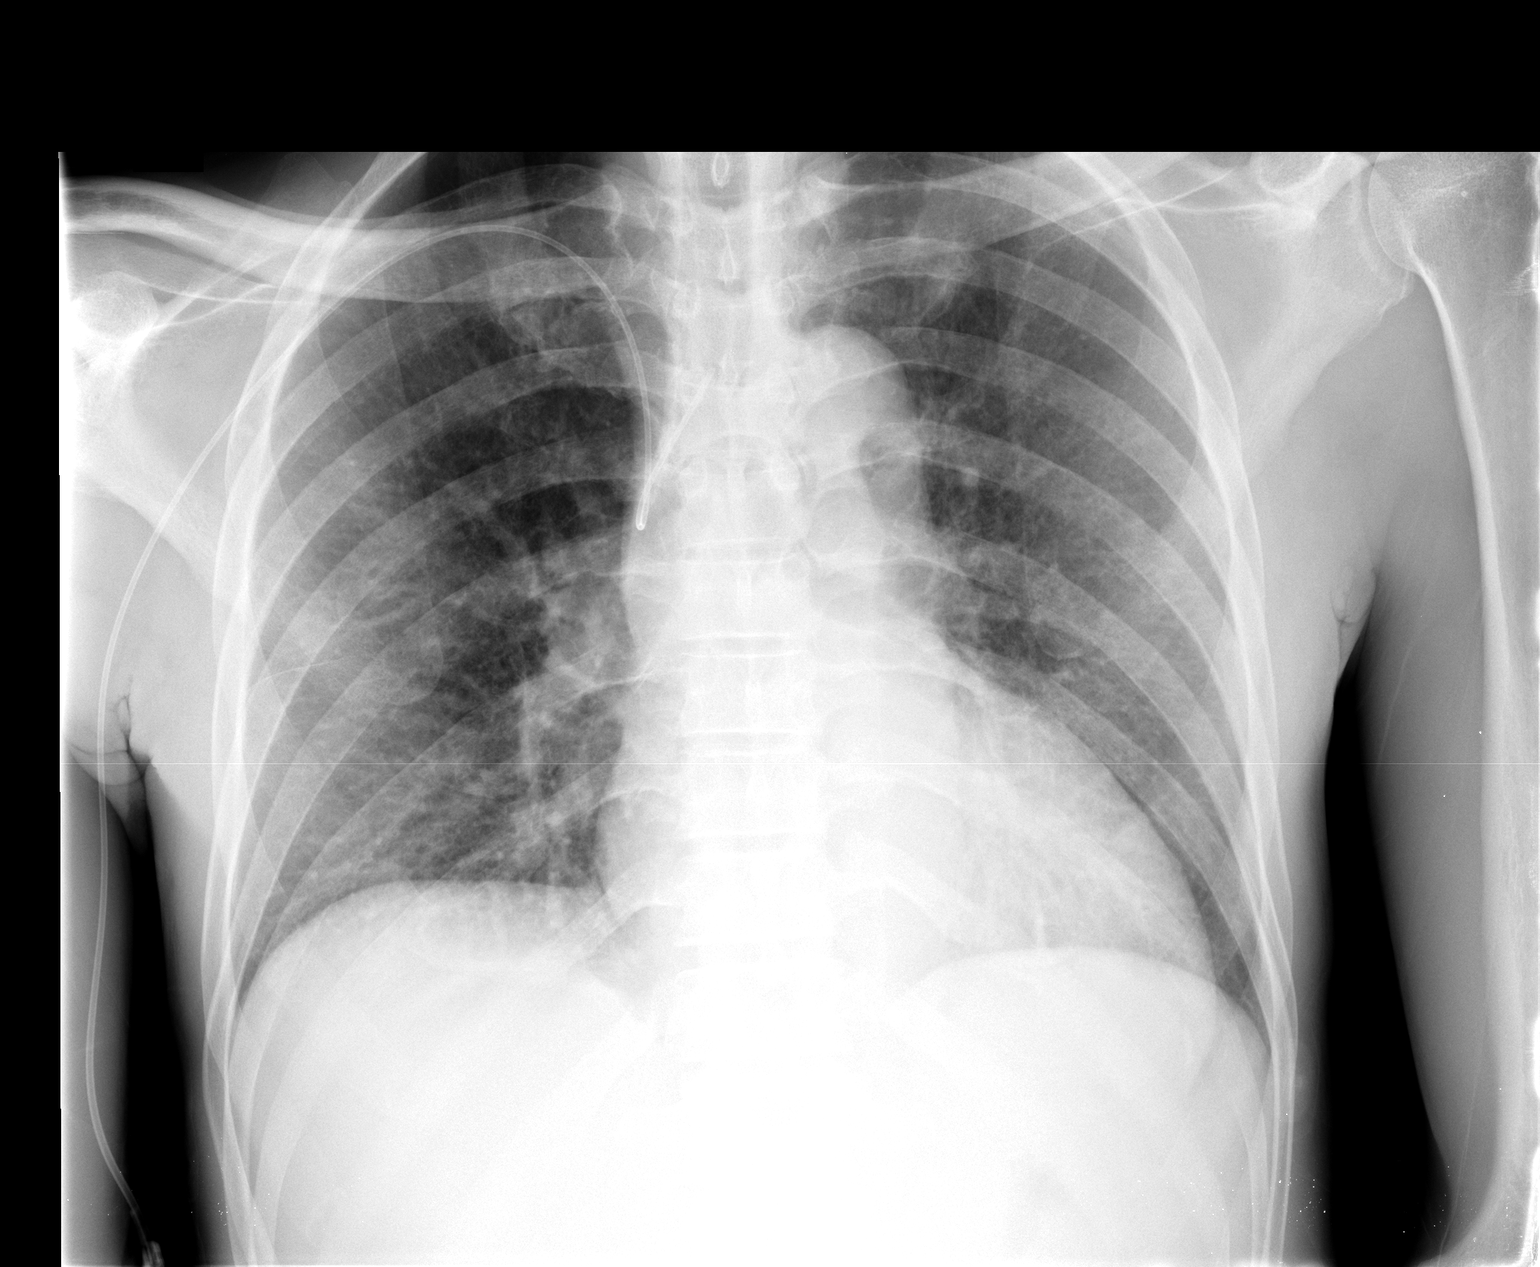

[1 of 1 positions shown; findings below may reference images not displayed]

FINDINGS: A right upper extremity PICC has been placed.  It
traverses from the right upper extremity into the left innominate
vein, then the tip is either in the left innominate vein or azygos
vein. Lungs are stable in appearance.  Interstitial prominence
worrisome for edema is noted.
IMPRESSION: The tip of the right upper extremity PICC is either in the left
innominate vein or azygos vein.

REF:W2 DICTATED: [DATE] [DATE]

## 2008-09-11 ENCOUNTER — Telehealth: Payer: Self-pay | Admitting: Infectious Disease

## 2008-09-12 ENCOUNTER — Encounter (INDEPENDENT_AMBULATORY_CARE_PROVIDER_SITE_OTHER): Payer: Self-pay | Admitting: Internal Medicine

## 2008-09-14 ENCOUNTER — Encounter: Payer: Self-pay | Admitting: Infectious Disease

## 2008-09-17 IMAGING — CR DG CHEST 2V
2 series · 2 of 2 positions shown · non-contrast
Comparison: Chest x-ray of [DATE]

CLINICAL DATA: Headache, nausea

CHEST - 2 VIEW

[w chest pa]
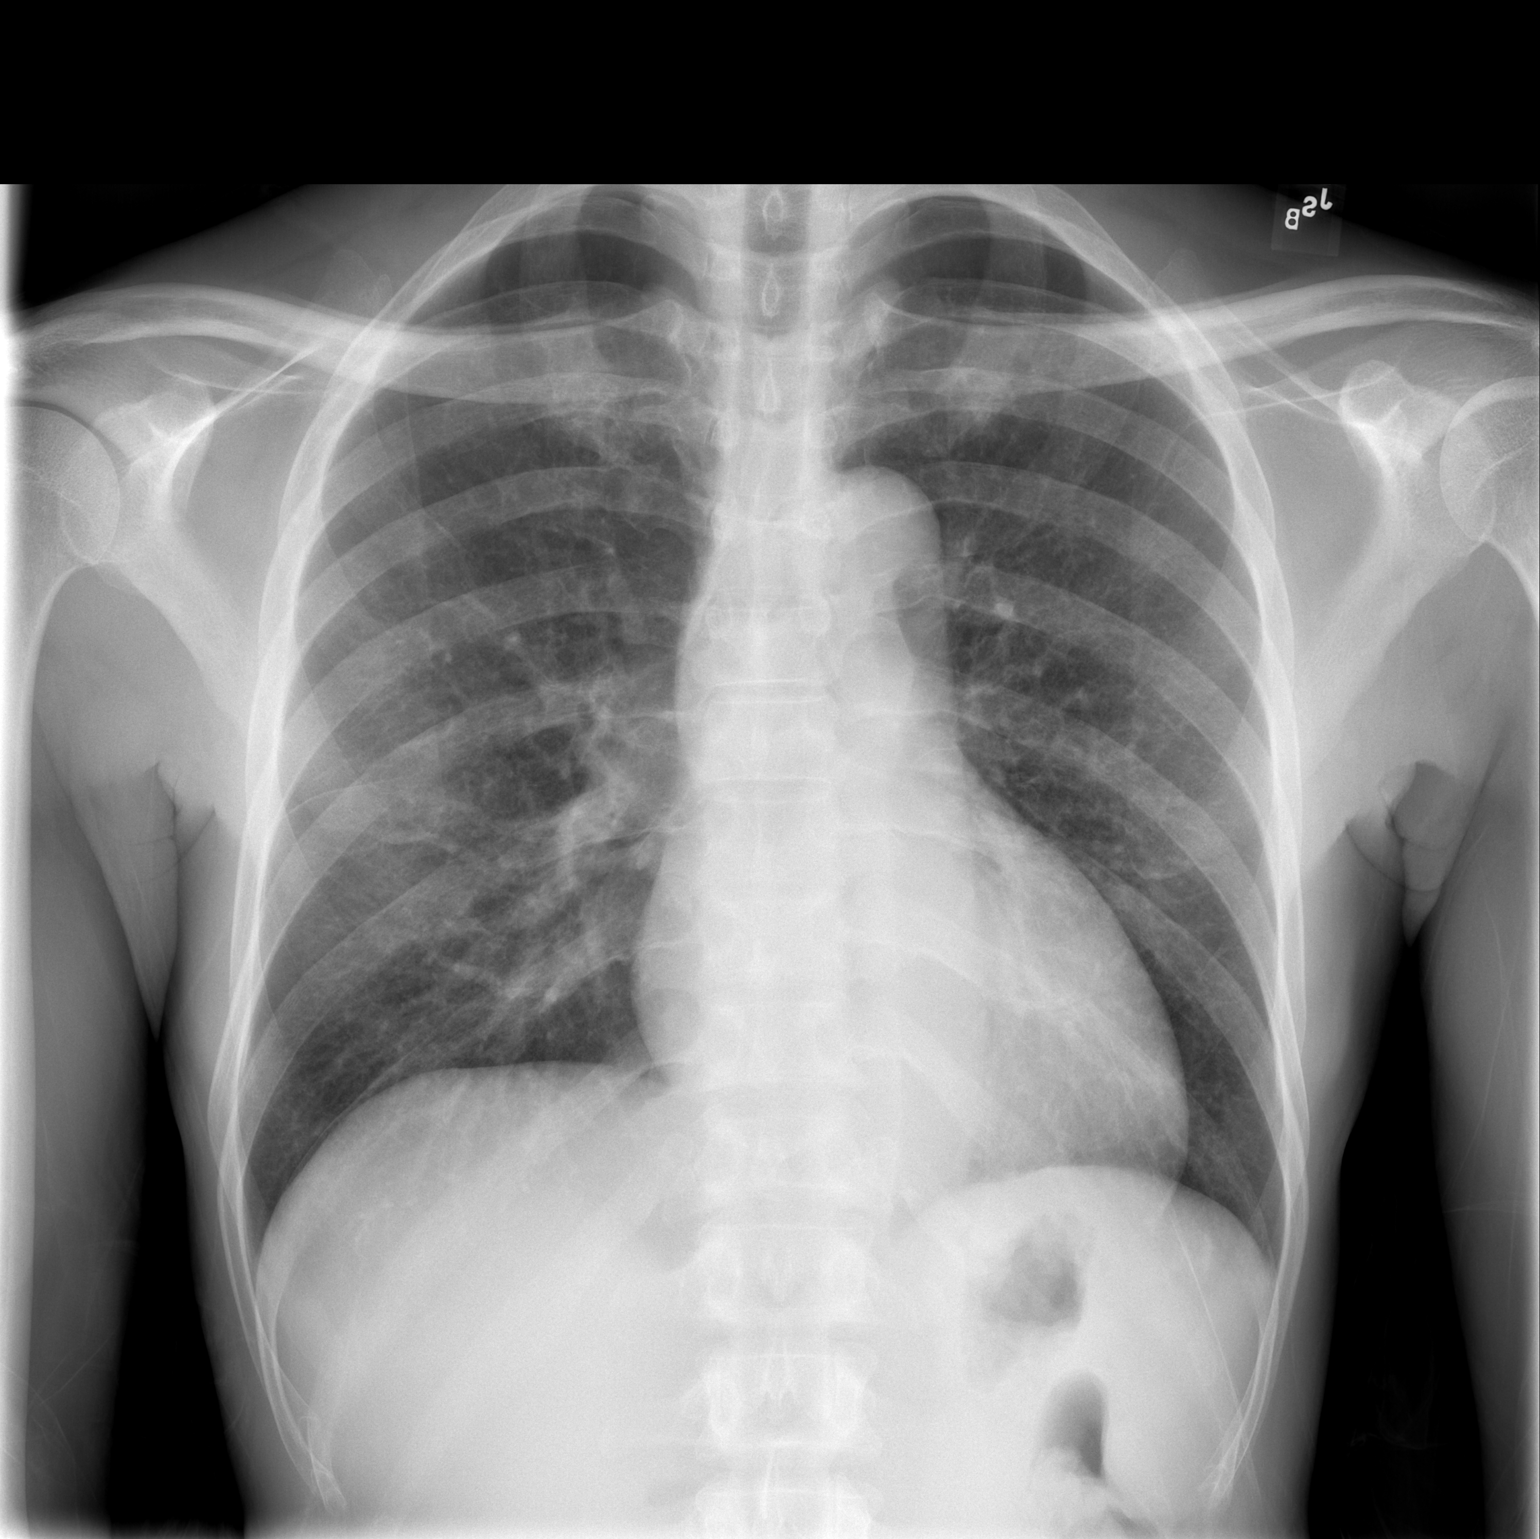

[w chest lat]
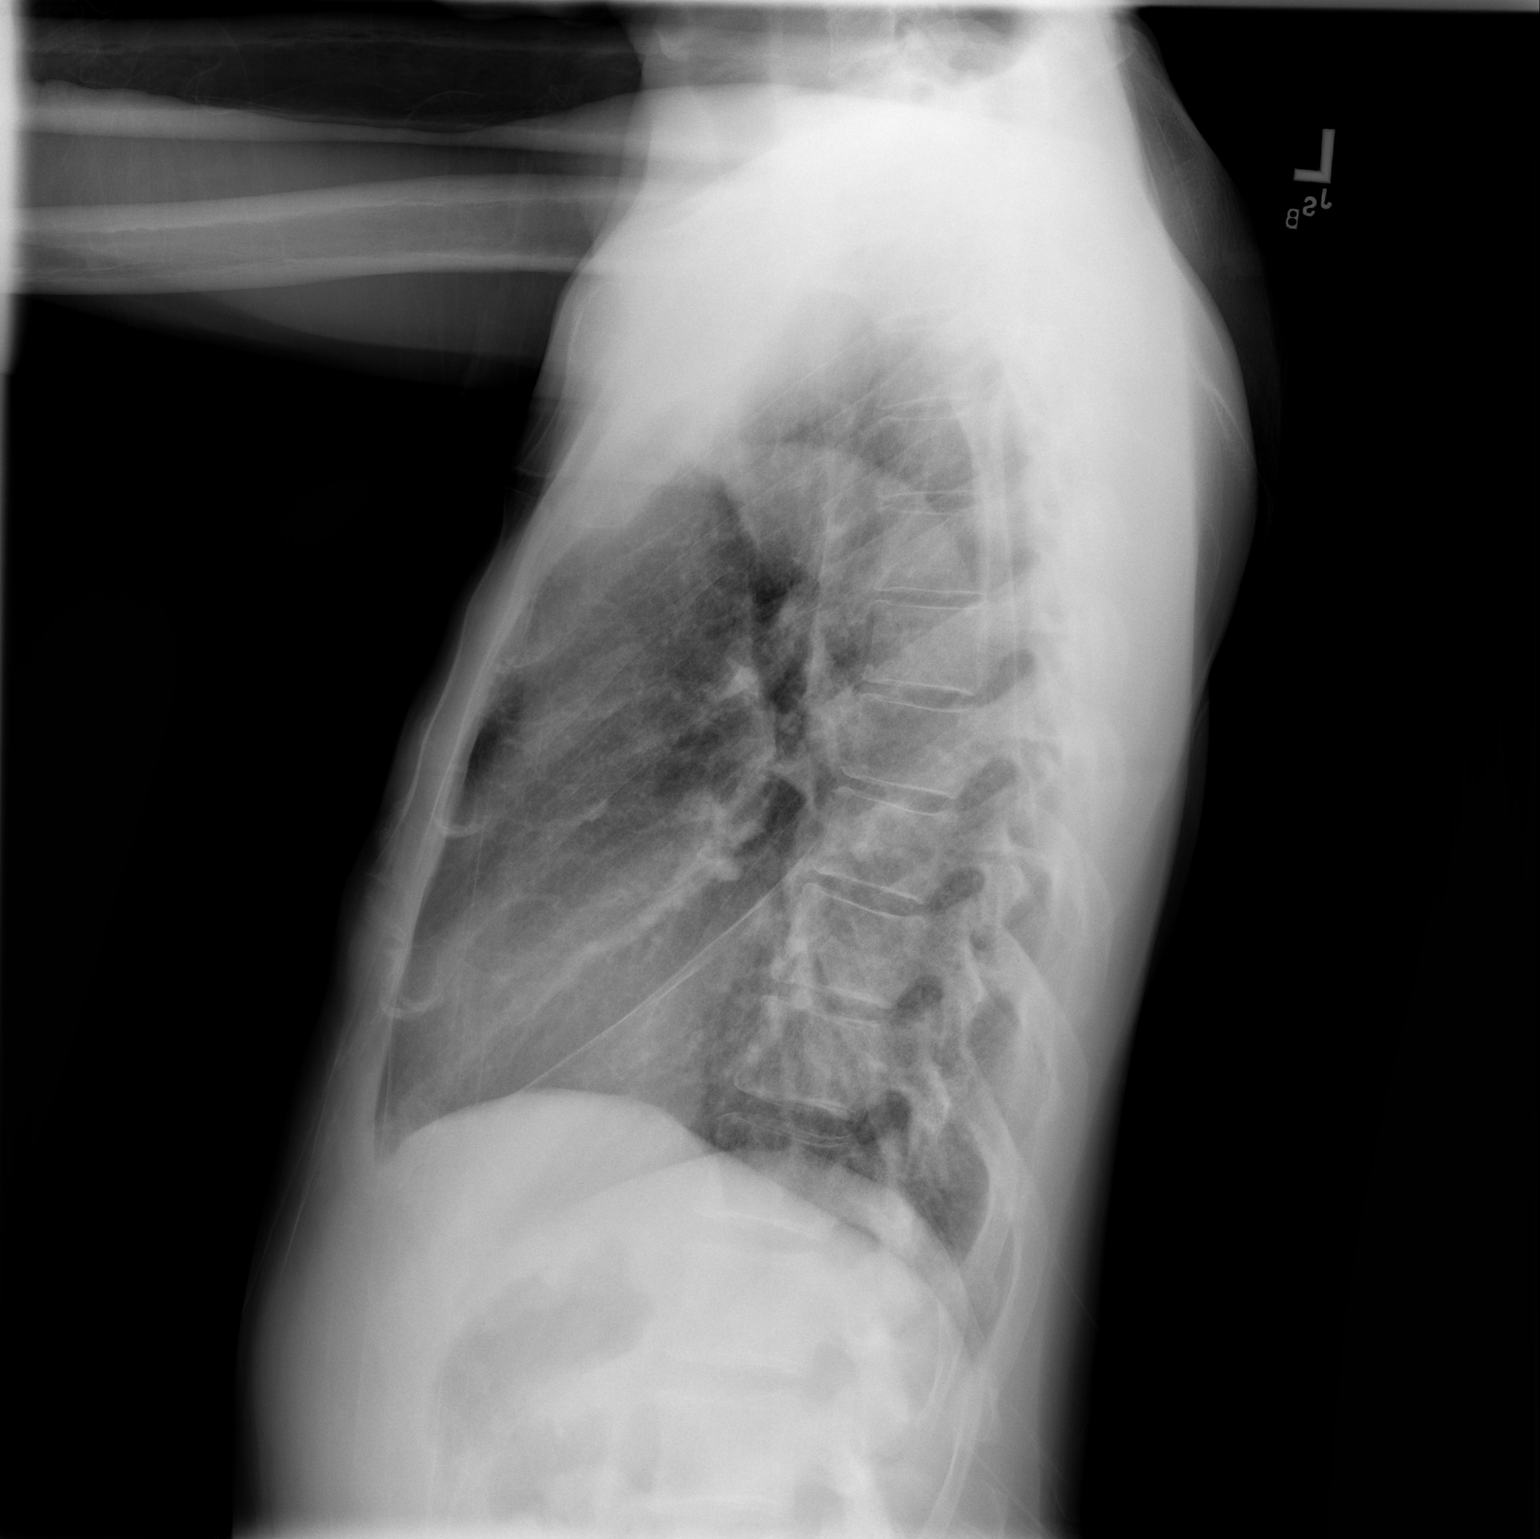

[2 of 2 positions shown; findings below may reference images not displayed]

FINDINGS: The lungs are clear.  The heart is within upper limits of
normal.  No bony abnormality is seen.
IMPRESSION: No active lung disease.

REF:G1 DICTATED: [DATE] [DATE]

## 2008-09-18 ENCOUNTER — Ambulatory Visit: Payer: Self-pay | Admitting: Infectious Disease

## 2008-09-18 ENCOUNTER — Inpatient Hospital Stay (HOSPITAL_COMMUNITY): Admission: EM | Admit: 2008-09-18 | Discharge: 2008-09-25 | Payer: Self-pay | Admitting: Emergency Medicine

## 2008-09-18 IMAGING — CR DG ABDOMEN ACUTE W/ 1V CHEST
3 series · 3 of 3 positions shown · non-contrast
Comparison: [DATE]

CLINICAL DATA: Abdominal pain, headache

ACUTE ABDOMEN SERIES (ABDOMEN 2 VIEW & CHEST 1 VIEW)

[w chest pa]
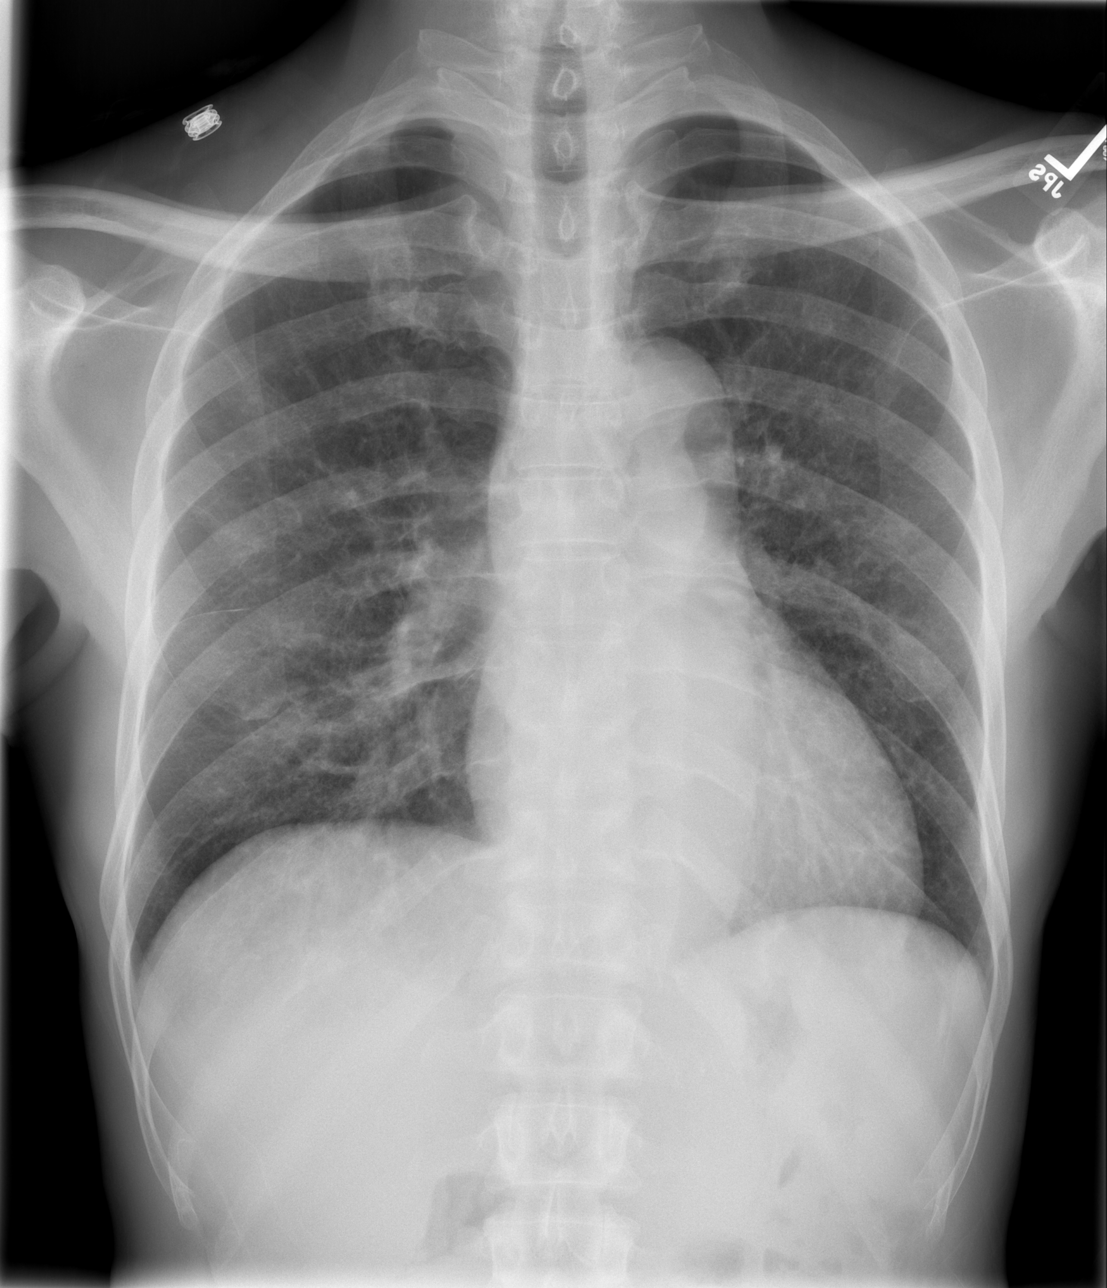

[w abdomen upright]
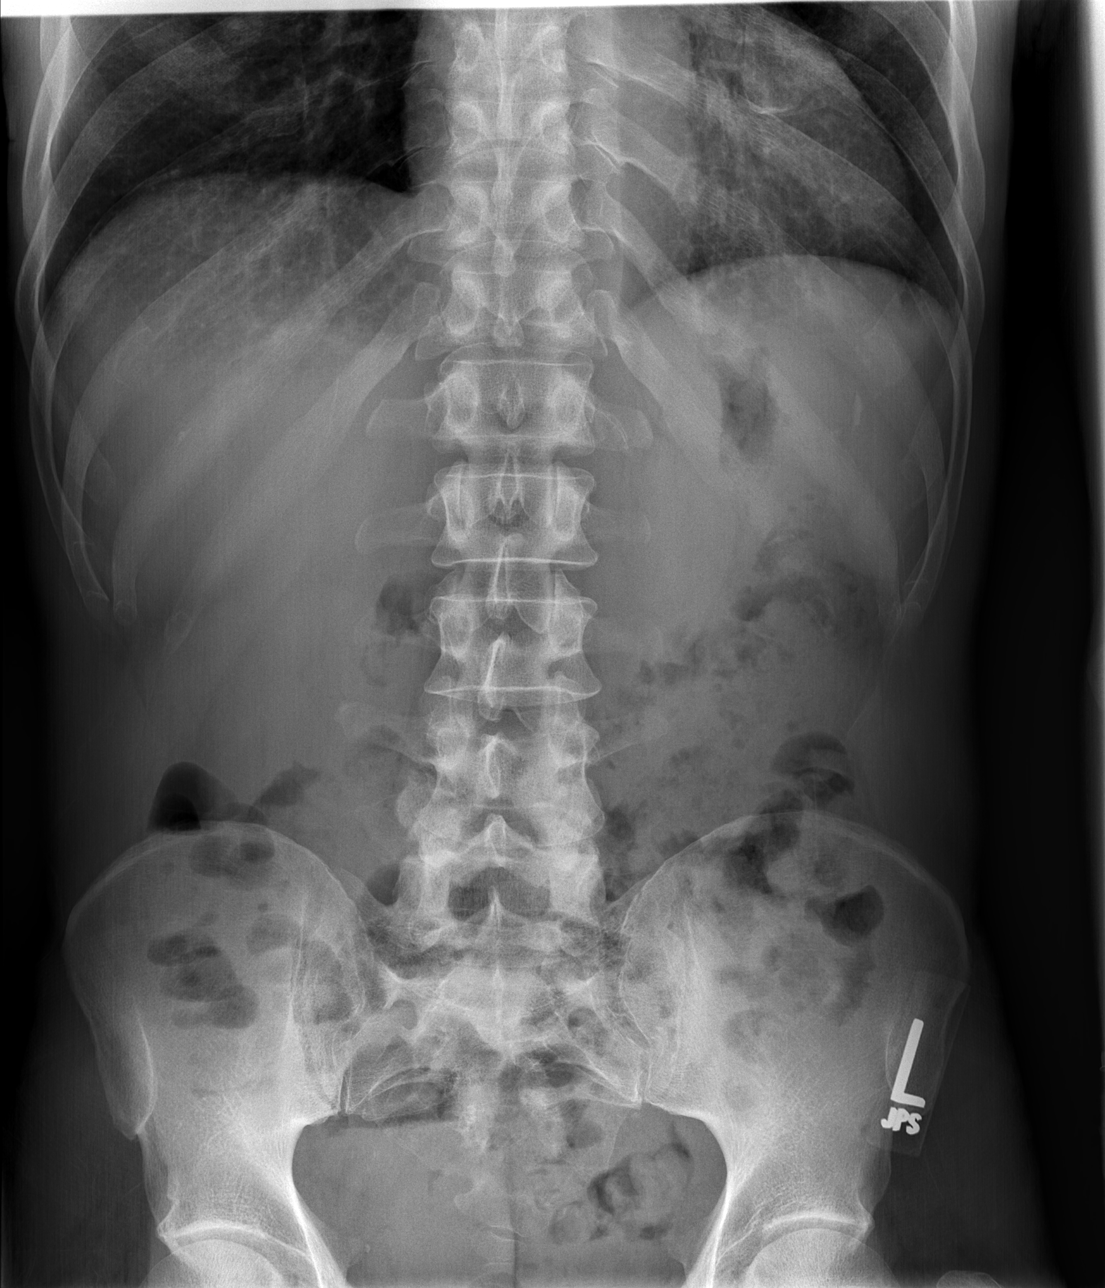

[t abdomen supine]
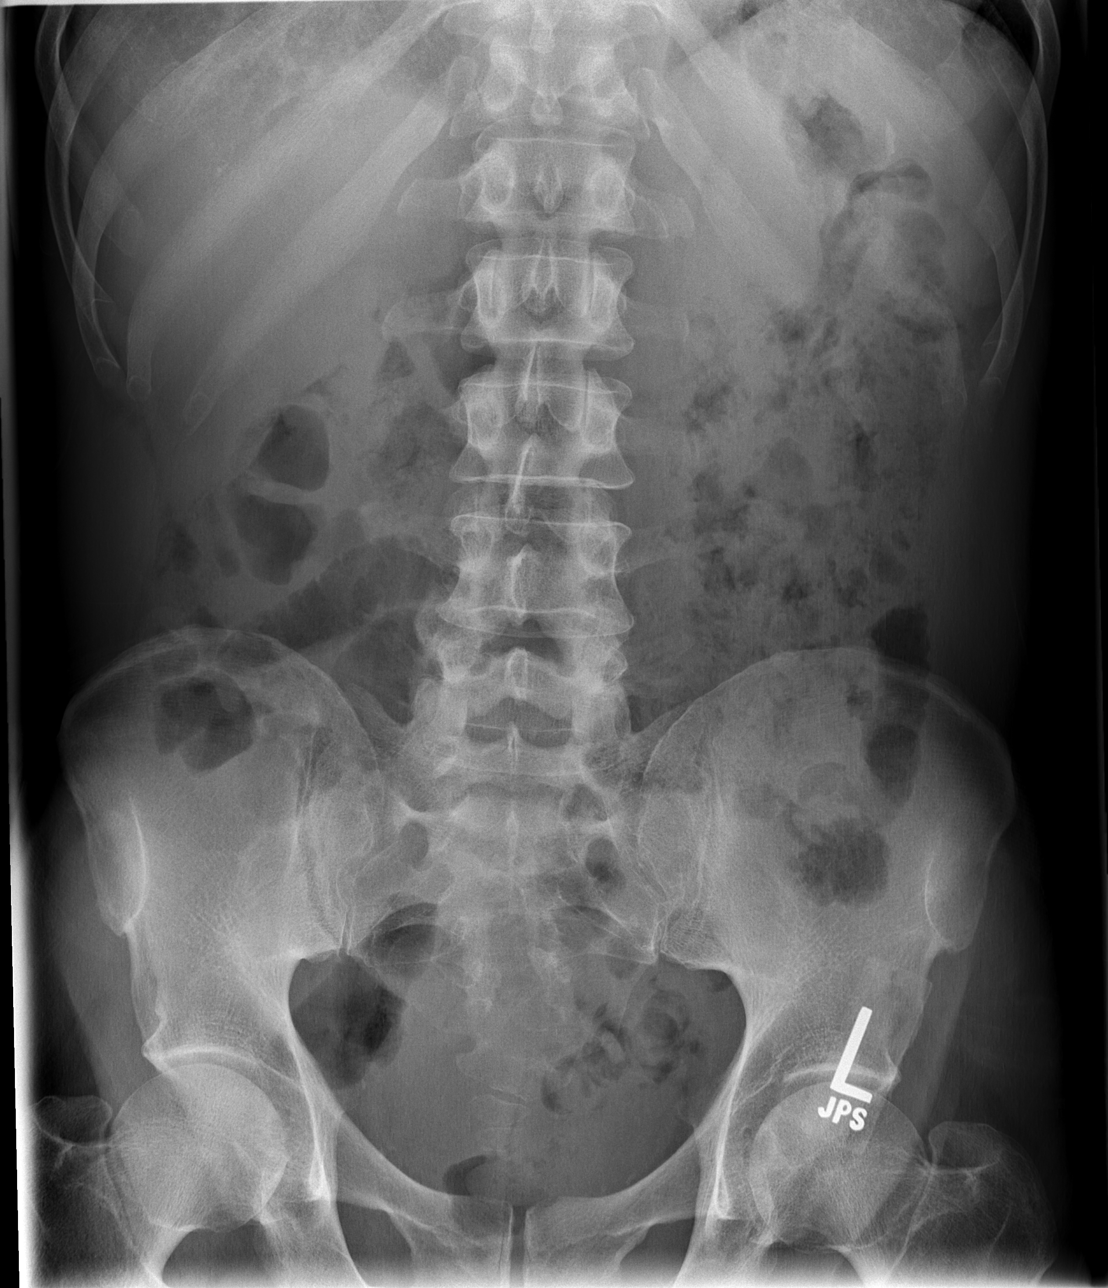

[3 of 3 positions shown; findings below may reference images not displayed]

FINDINGS: Coarse perihilar and bibasilar interstitial markings as
before.  No confluent airspace infiltrates or overt edema.  Heart
size upper limits normal.  No free air on the erect film.  Normal
bowel gas pattern.  No abnormal abdominal calcifications.
Visualized bones unremarkable.
IMPRESSION: 1.  Normal bowel gas pattern.
2.  No free air.
3.  No acute cardiopulmonary disease.

REF:W2 DICTATED: [DATE] [DATE]

## 2008-09-18 IMAGING — CT CT HEAD W/O CM
1 of 2 series · 13 of 30 positions shown, 17 images · non-contrast
Comparison: CT brain scan of [DATE]

CLINICAL DATA: Headaches

CT HEAD WITHOUT CONTRAST
TECHNIQUE: Contiguous axial images were obtained from the base of
the skull through the vertex without contrast.

[Series 2: brain · axial · 0.47mm/px · z∈[+138,+258]mm · 13 of 28 slices shown, 17 images]
[im 2/28  brain]
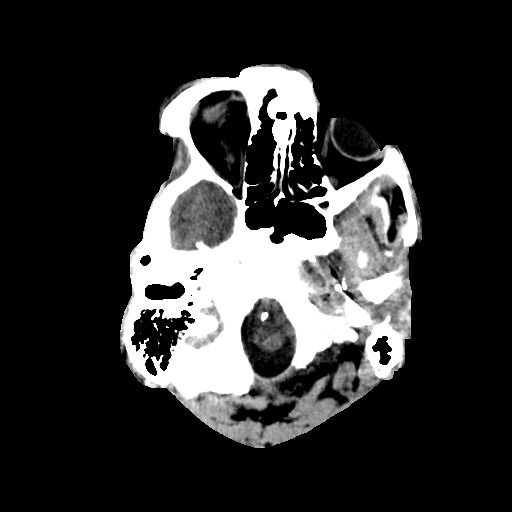
[im 2/28  bone]
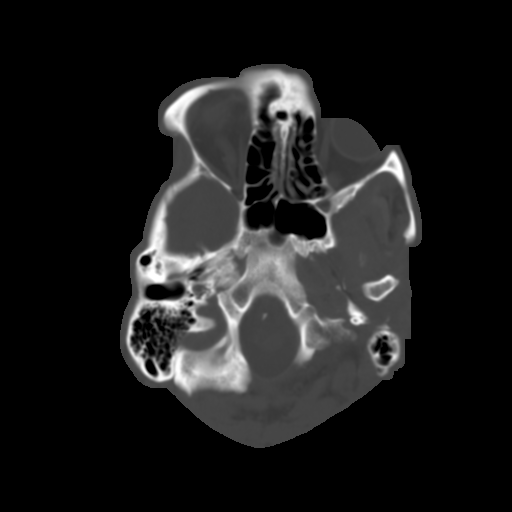
[im 4/28  brain]
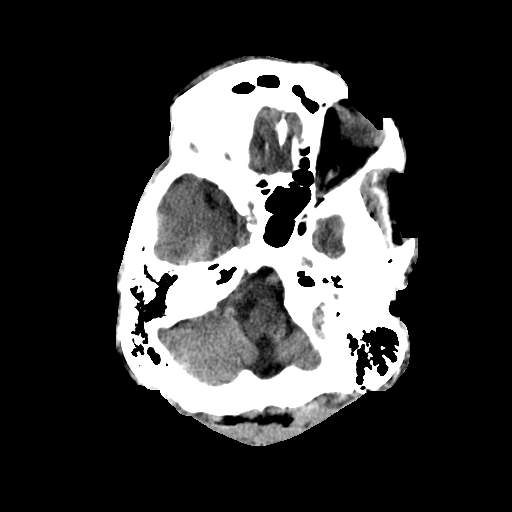
[im 6/28  brain]
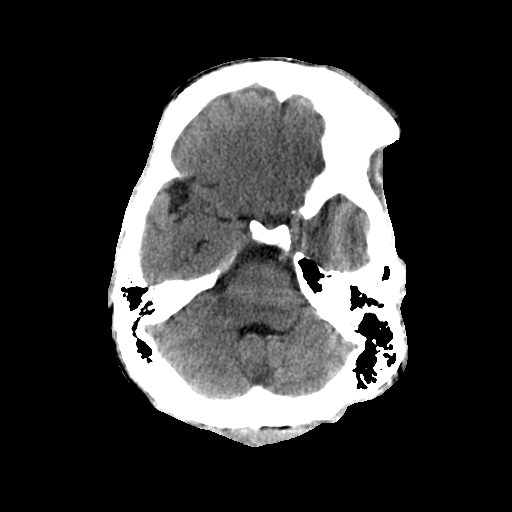
[im 8/28  brain]
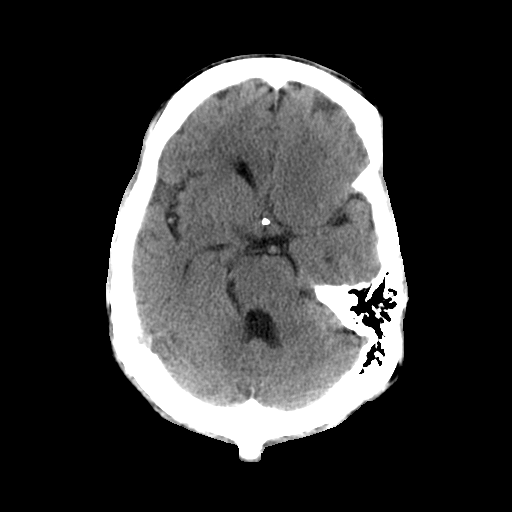
[im 10/28  brain]
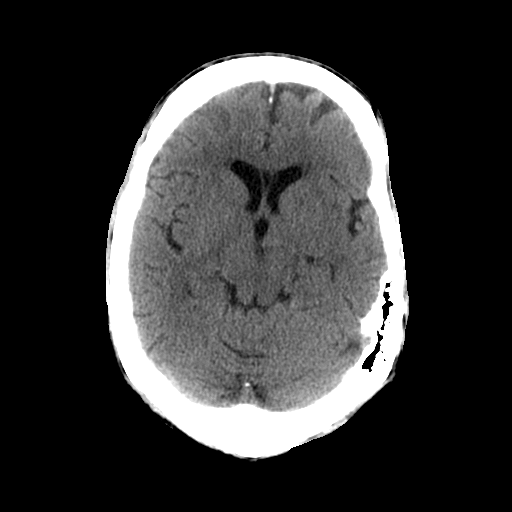
[im 10/28  bone]
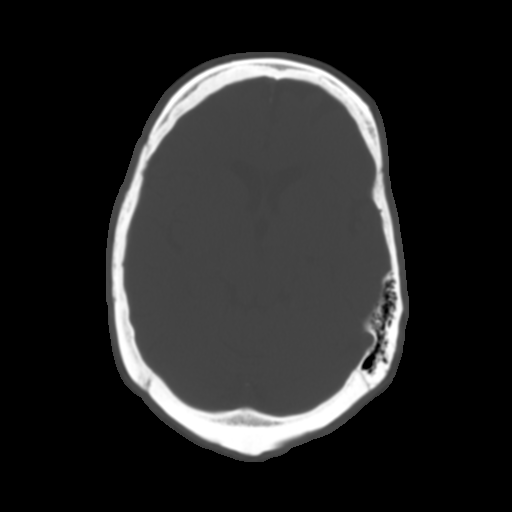
[im 12/28  brain]
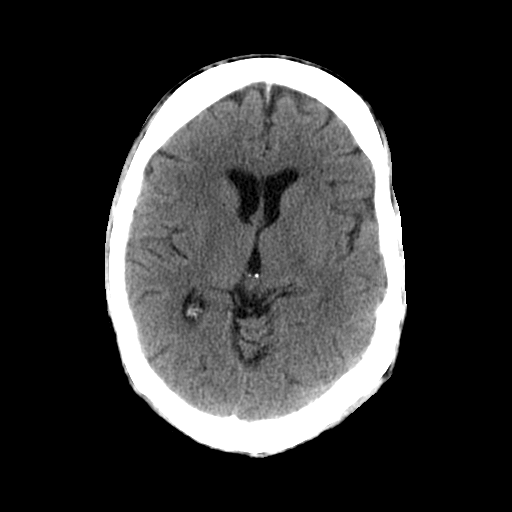
[im 14/28  brain]
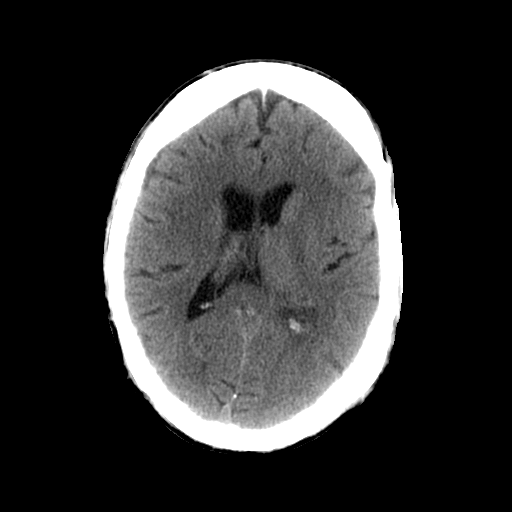
[im 16/28  brain]
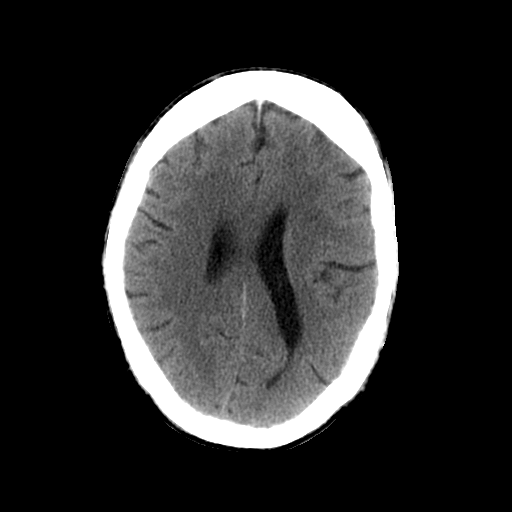
[im 18/28  brain]
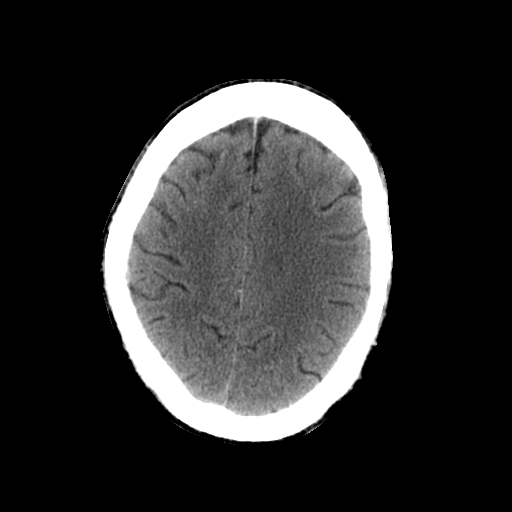
[im 18/28  bone]
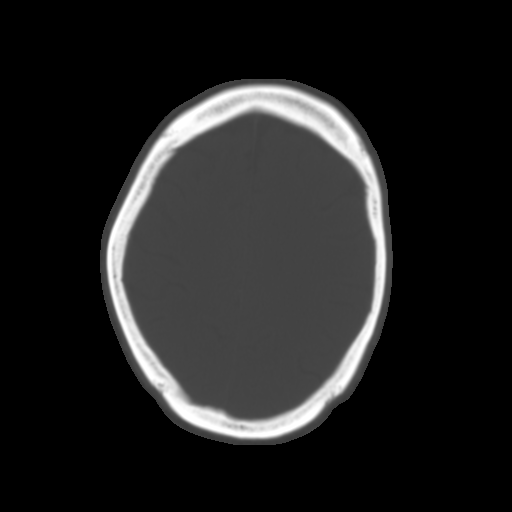
[im 20/28  brain]
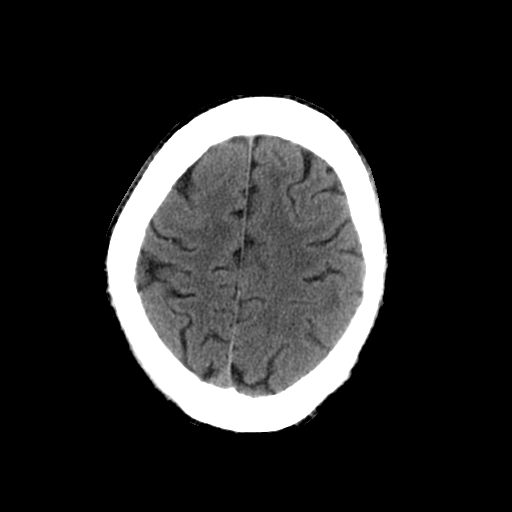
[im 22/28  brain]
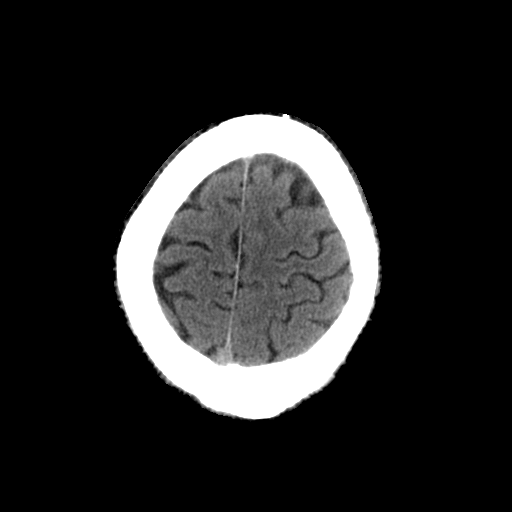
[im 24/28  brain]
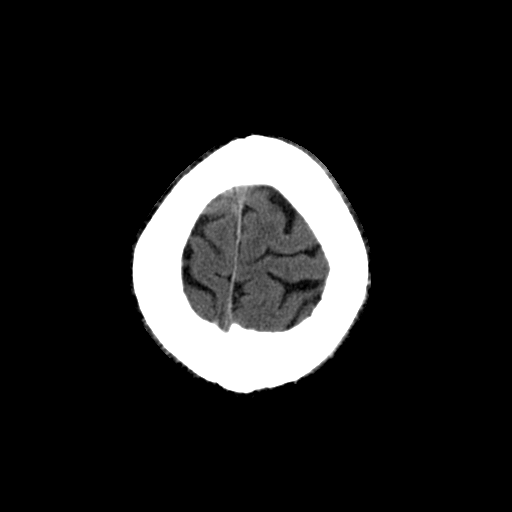
[im 26/28  brain]
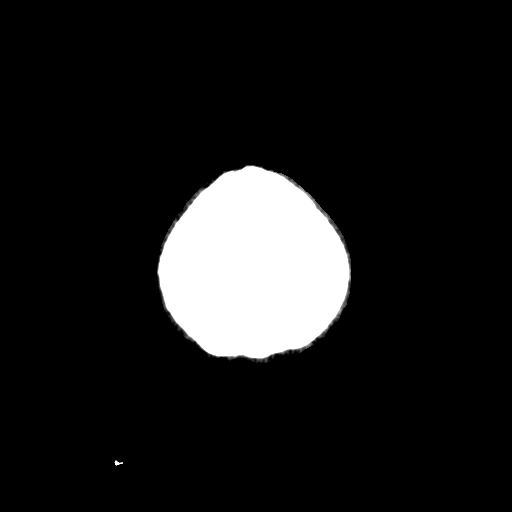
[im 26/28  bone]
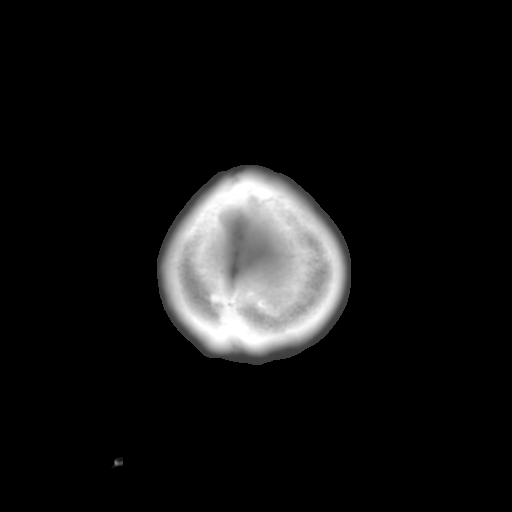

[13 of 30 positions shown; findings below may reference images not displayed]

FINDINGS: The ventricular system is stable in size and
configuration, and the septum is in a normal midline position.  The
fourth ventricle and basilar cisterns appear normal.  No blood,
edema, or mass effect is seen.  No bony abnormality is noted.
IMPRESSION: No acute intracranial abnormality.

## 2008-09-19 IMAGING — CT CT HEAD W/ CM
1 of 4 series · 9 of 30 positions shown, 12 images · IV contrast (agent unspecified)
Comparison: [DATE]

CLINICAL DATA: Altered mental status.

CT HEAD WITH CONTRAST
TECHNIQUE: Contiguous axial images were obtained from the base of
the skull through the vertex with intravenous contrast

[Series 2: brain · axial · 0.49mm/px · z∈[+147,+257]mm · 9 of 28 slices shown, 12 images]
[im 3/28  brain]
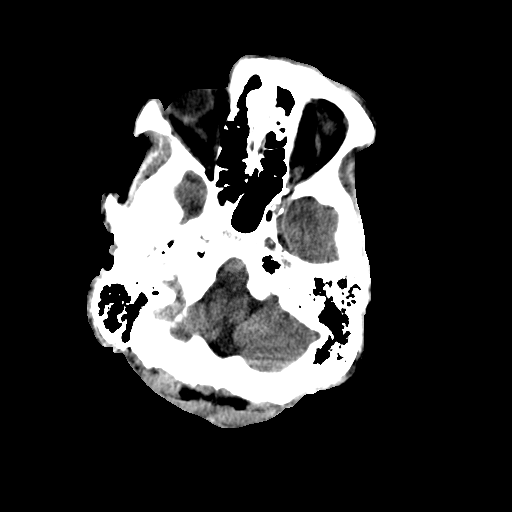
[im 3/28  bone]
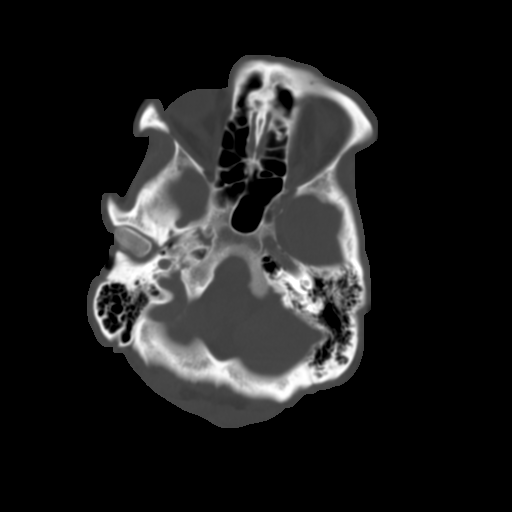
[im 6/28  brain]
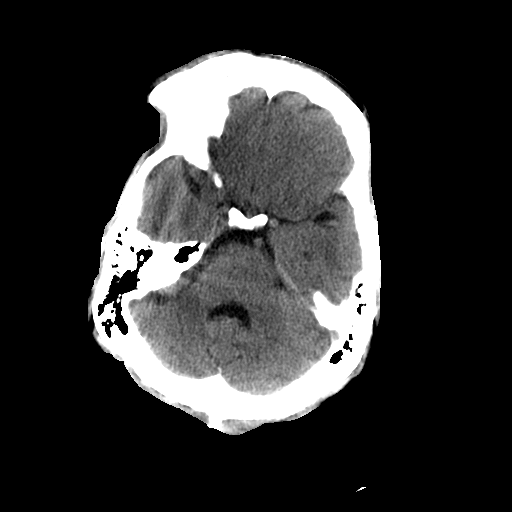
[im 9/28  brain]
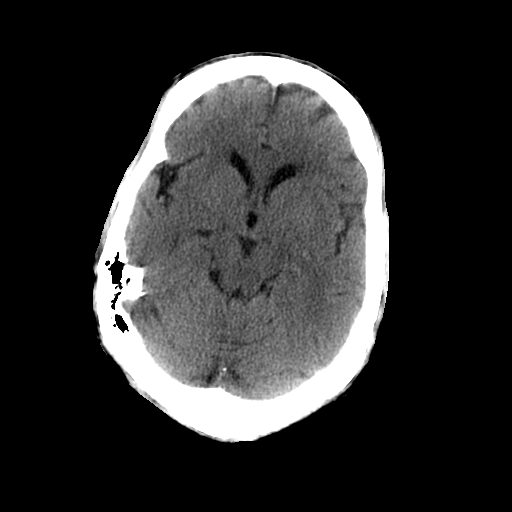
[im 11/28  brain]
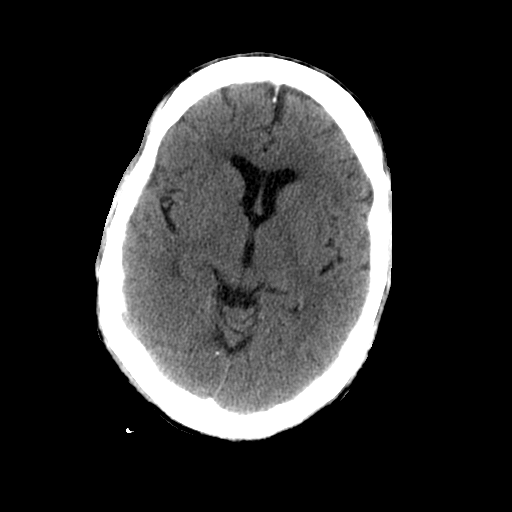
[im 14/28  brain]
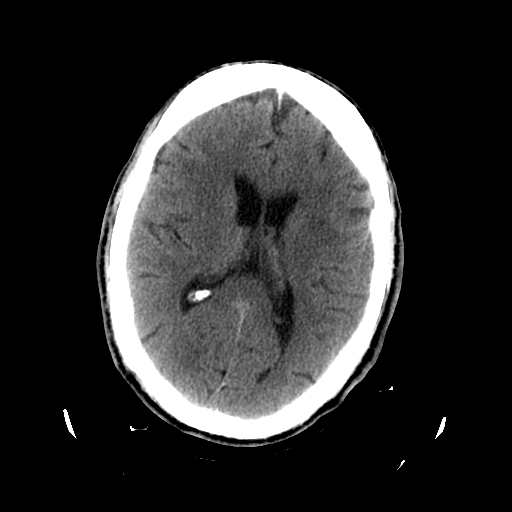
[im 14/28  bone]
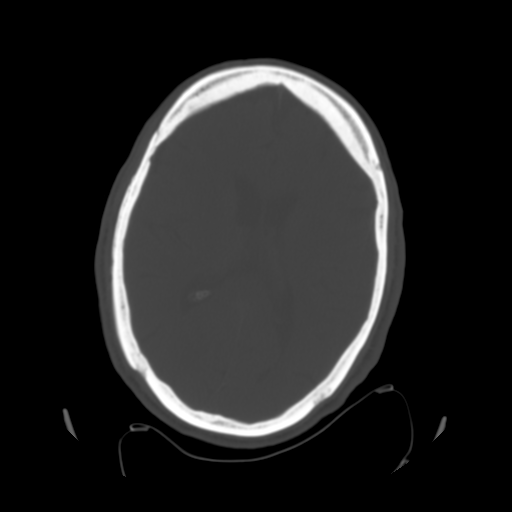
[im 17/28  brain]
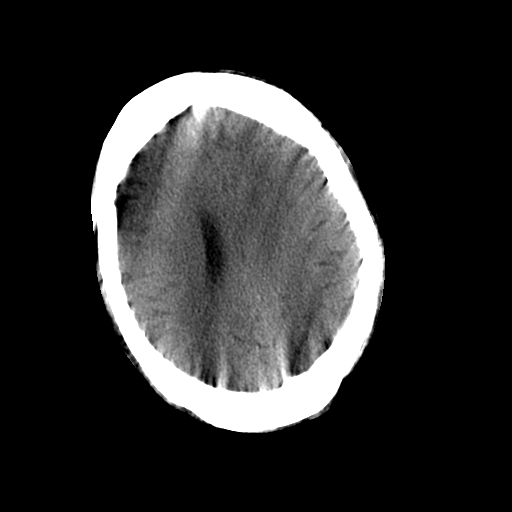
[im 19/28  brain]
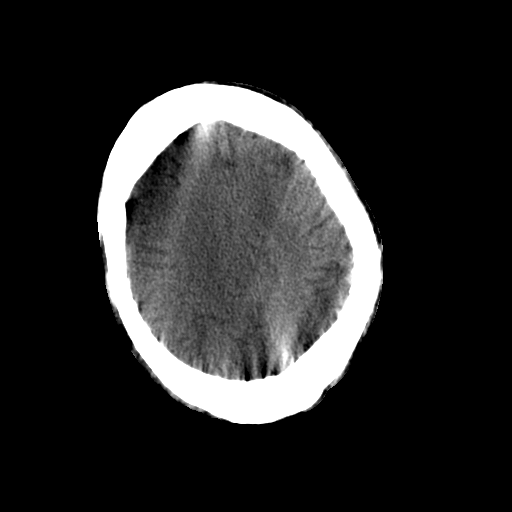
[im 22/28  brain]
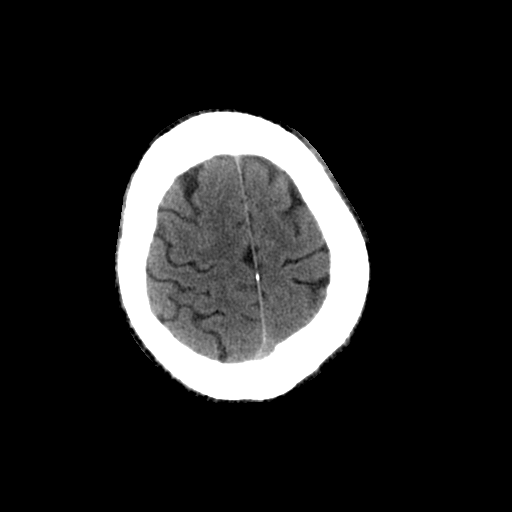
[im 25/28  brain]
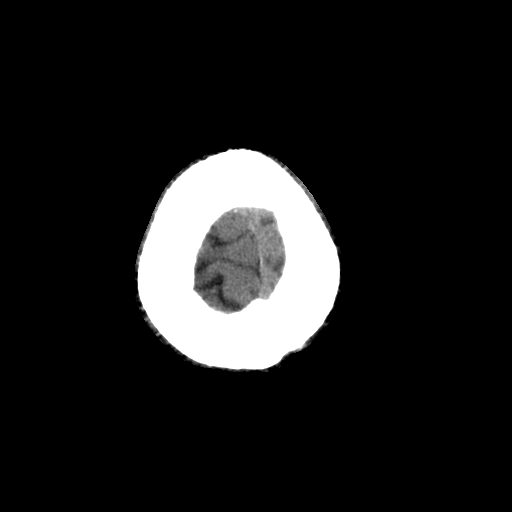
[im 25/28  bone]
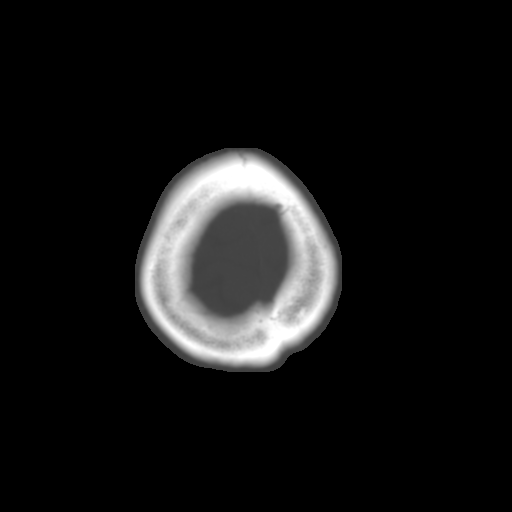

[9 of 30 positions shown; findings below may reference images not displayed]

FINDINGS: No acute intracranial abnormality.  Specifically, no
hemorrhage, hydrocephalus, mass lesion, acute infarction, or
significant intracranial injury.  No acute calvarial abnormality.
IMPRESSION: No acute intracranial abnormality.

## 2008-09-22 IMAGING — RF DG FLUORO GUIDE NDL PLC/BX
1 series · 1 of 1 positions shown · non-contrast
Comparison: none

CLINICAL DATA: 52-year-old male with HIV, suspected cryptococcal
meningitis, and history of hepatitis C.

DIAGNOSTIC LUMBAR PUNCTURE UNDER FLUOROSCOPIC GUIDANCE
TECHNIQUE: Informed consent was obtained from the patient prior to
the procedure, including potential complications of headache,
allergy, and pain.   With the patient prone, the lower back was
prepped and draped in the usual sterile fashion with Betadine.  1%
Lidocaine was used for local anesthesia.  Using fluoroscopic
guidance, lumbar puncture was performed at the L2-L3 level using
right sublaminar technique.  A 20 gauge spinal needle was advanced
into the thecal sac with return of yellow tinged, slightly cloudy
CSF.   Opening pressure was measured to be 19 cm of water.  Closing
pressure was measured and found to be 13 cm of water.   14 mL of
CSF were collected into 4 tubes for laboratory studies.  The
patient tolerated the procedure well and there were no apparent
complications.  The patient was given appropriate post procedural
instructions and returned to the [facility] for further care.

[Series 1: run · 1 of 1 slices shown]
[im 1/1]
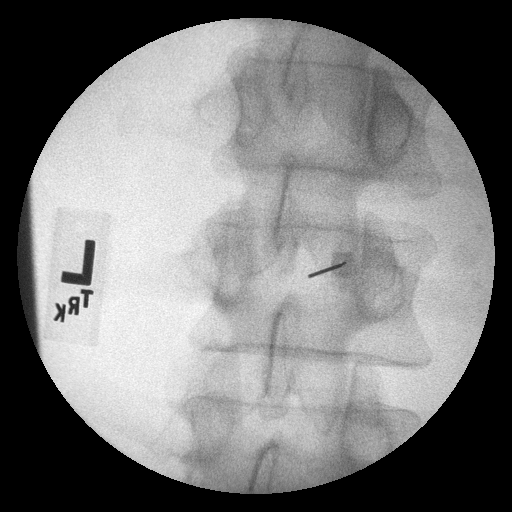

[1 of 1 positions shown; findings below may reference images not displayed]

IMPRESSION: Fluoroscopic-guided lumbar puncture at L2-L3 with collection of CSF
(yellow tinged and slightly cloudy) which was transported to the
lab for analysis.
Opening pressure was measured to be 19 cm of water.  Closing
pressure was measured and found to be 13 cm of water.

## 2008-09-25 ENCOUNTER — Telehealth (INDEPENDENT_AMBULATORY_CARE_PROVIDER_SITE_OTHER): Payer: Self-pay | Admitting: *Deleted

## 2008-10-16 ENCOUNTER — Telehealth (INDEPENDENT_AMBULATORY_CARE_PROVIDER_SITE_OTHER): Payer: Self-pay | Admitting: *Deleted

## 2008-10-19 ENCOUNTER — Encounter: Payer: Self-pay | Admitting: Internal Medicine

## 2008-10-19 IMAGING — CT CT HEAD WO/W CM
3 of 4 series · 17 of 30 positions shown, 19 images · IV contrast (100 ML OMNI 300)
Comparison: [DATE]

CLINICAL DATA: Headache, weakness and bilateral leg pain.

CT HEAD WITHOUT AND WITH CONTRAST
TECHNIQUE: Contiguous axial images were obtained from the base of
the skull through the vertex without and with intravenous contrast.
Contrast: 100 ml [ZH]

[Series 2: brain · axial · 0.47mm/px · z∈[+165,+255]mm · 5 of 28 slices shown]
[im 5/28  brain]
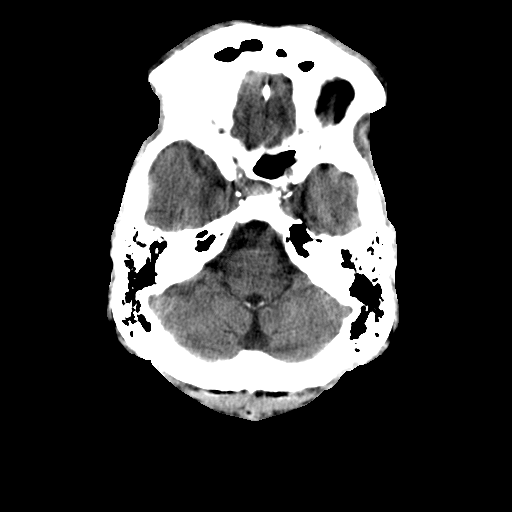
[im 10/28  brain]
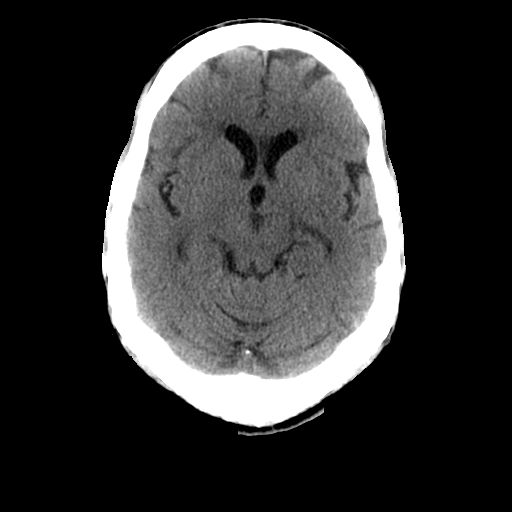
[im 14/28  brain]
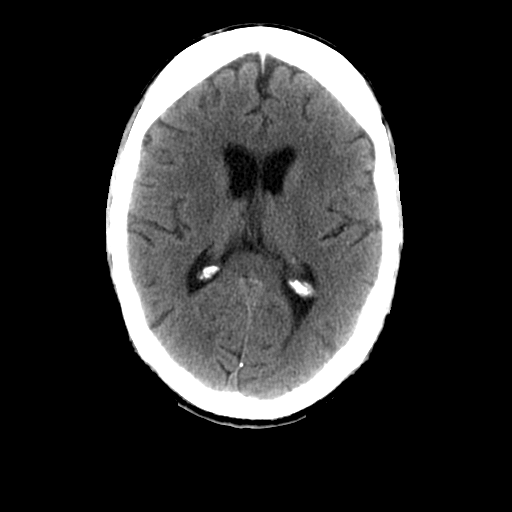
[im 19/28  brain]
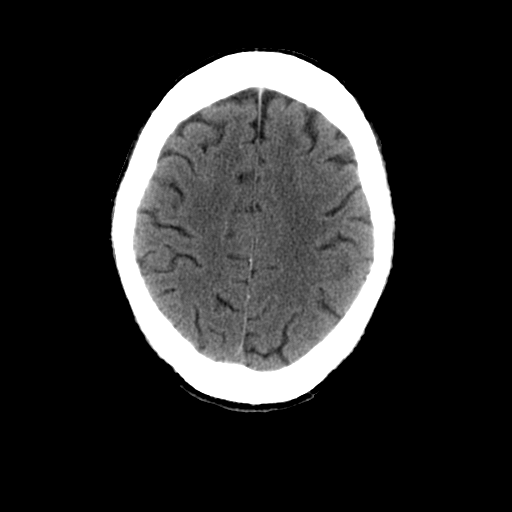
[im 23/28  brain]
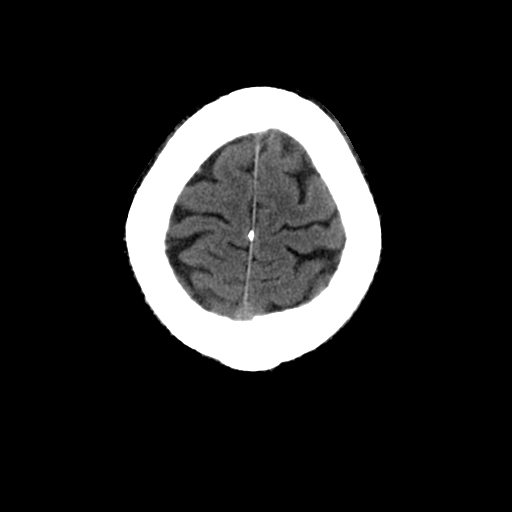

[Series 4: — · axial · 0.47mm/px · z∈[+160,+260]mm · 6 of 28 slices shown, 8 images]
[im 4/28  brain]
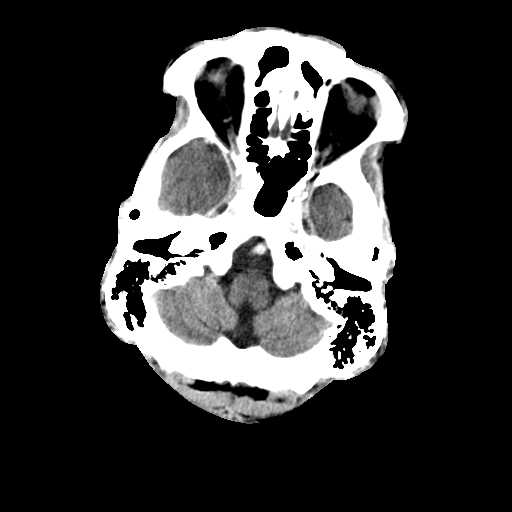
[im 4/28  bone]
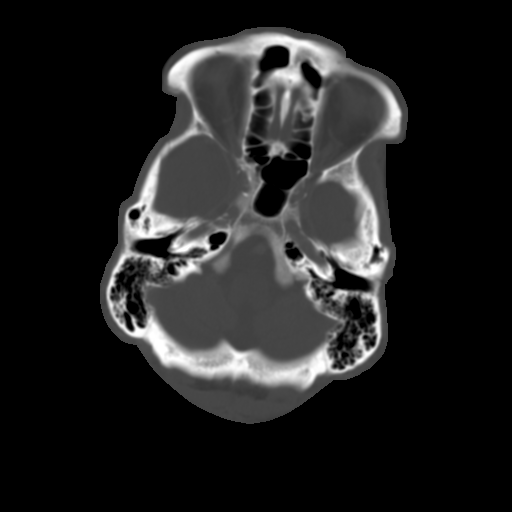
[im 8/28  brain]
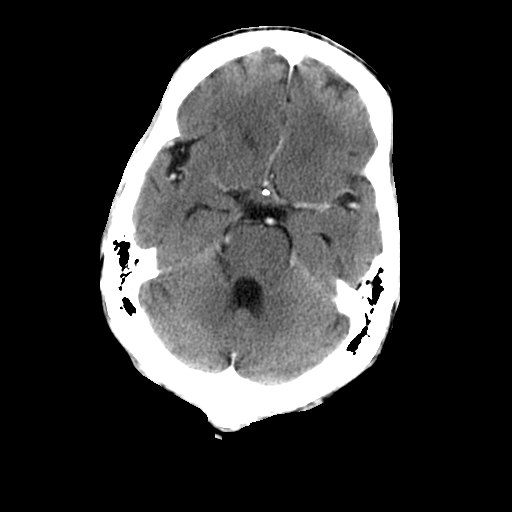
[im 12/28  brain]
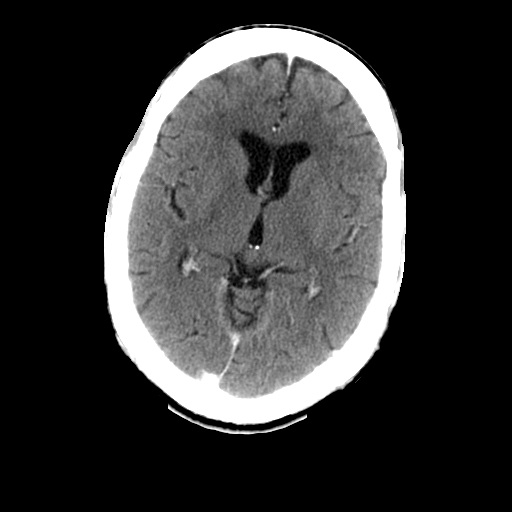
[im 16/28  brain]
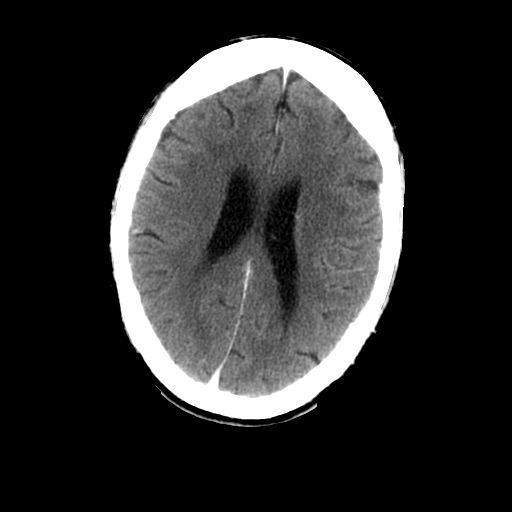
[im 20/28  brain]
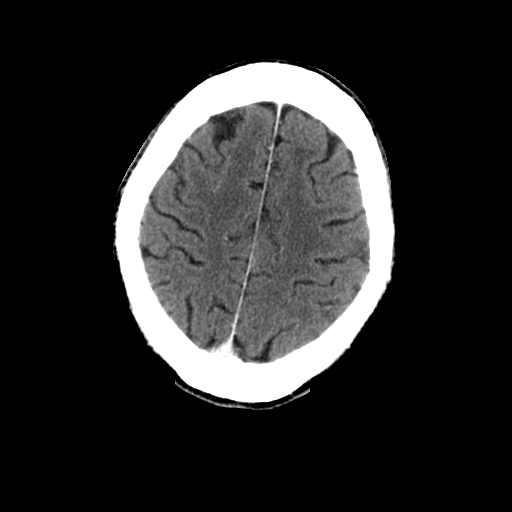
[im 20/28  bone]
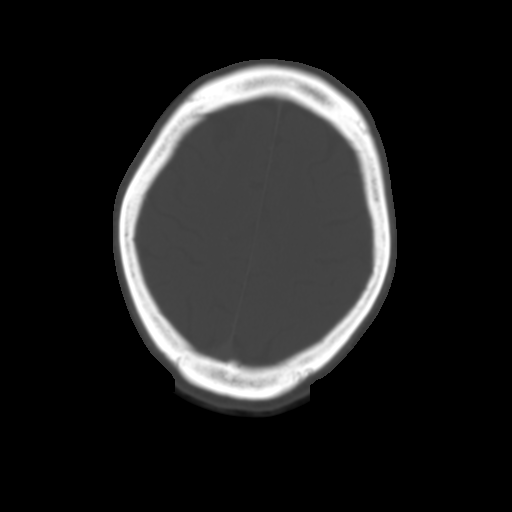
[im 24/28  brain]
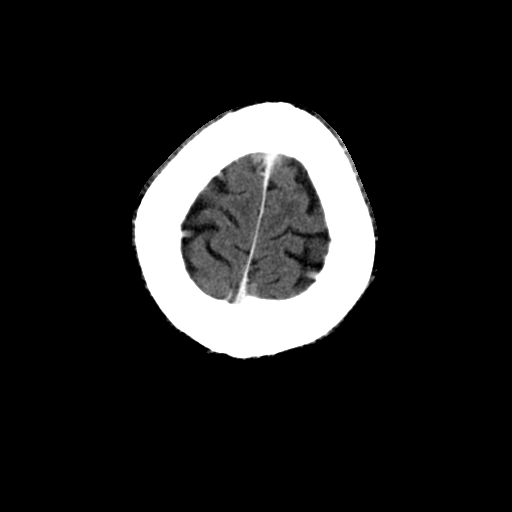

[Series 5: recon 2: · axial · 0.47mm/px · z∈[+160,+260]mm · 6 of 28 slices shown]
[im 4/28  brain]
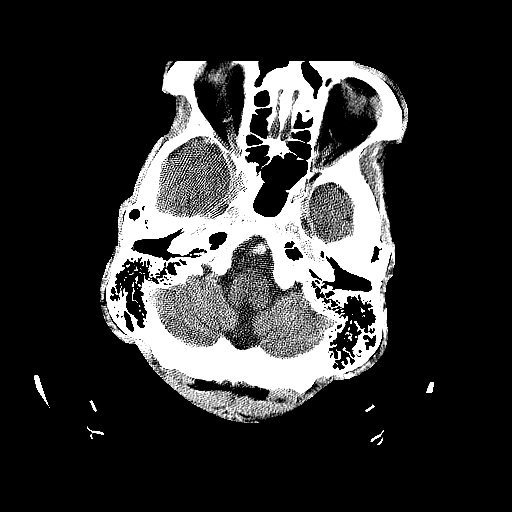
[im 8/28  brain]
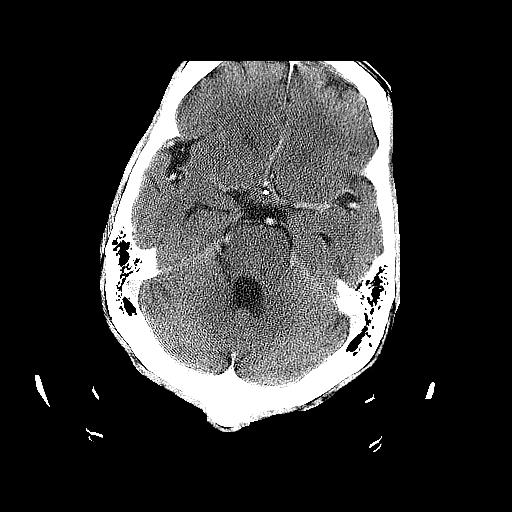
[im 12/28  brain]
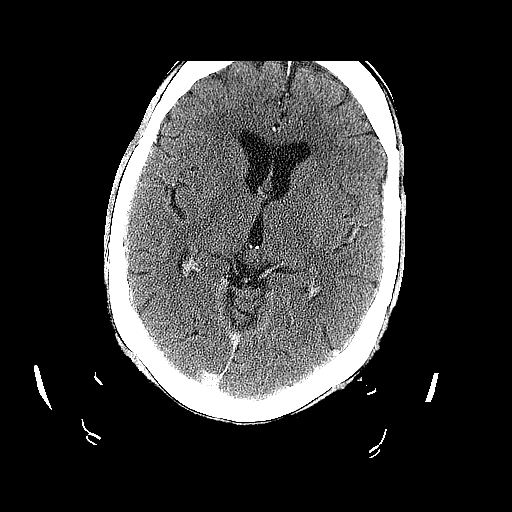
[im 16/28  brain]
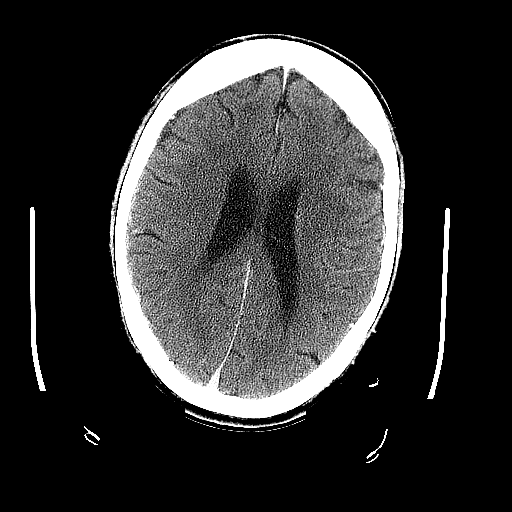
[im 20/28  brain]
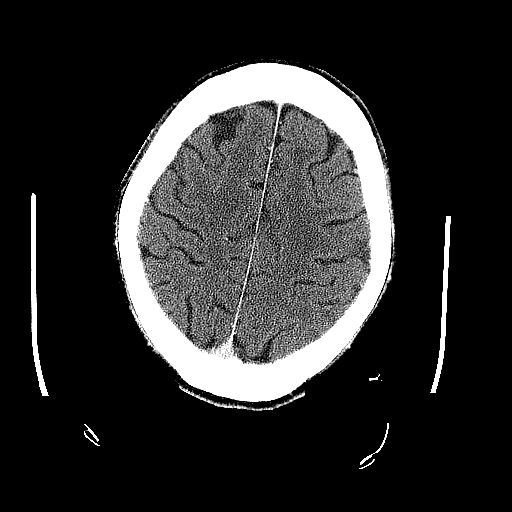
[im 24/28  brain]
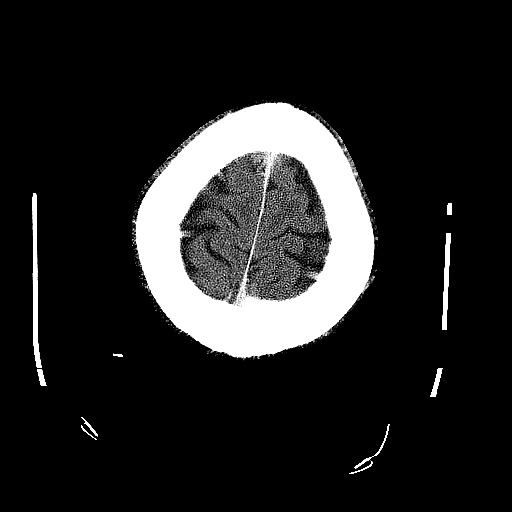

[17 of 30 positions shown; findings below may reference images not displayed]

FINDINGS: The ventricles are normal and stable.  No extra-axial
fluid collections are seen.  Stable benign appearing basal ganglia
calcifications on the left side.   The brainstem and cerebellum
appear normal and stable.  No CT findings for hemispheric
infarction, intracranial mass lesion or hemorrhage. No abnormal
areas of contrast enhancement are seen.  The major vascular
structures appear normal including the dural venous sinuses.

The bony calvarium is intact.  The visualized paranasal sinuses and
mastoid air cells are clear.  The globes are intact.
IMPRESSION: No acute intracranial findings.  No change since recent prior head
CT.

## 2008-10-19 IMAGING — RF DG FLUORO GUIDE NDL PLC/BX
1 series · 1 of 1 positions shown · non-contrast
Comparison: None

CLINICAL DATA: Headache, fever and elevated white count.

FLUORO GUIDED NEEDLE PLACEMENT

[Series 1: run · 1 of 1 slices shown]
[im 1/1]
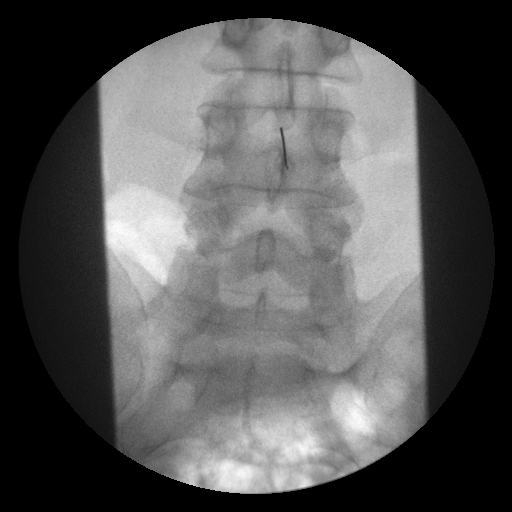

[1 of 1 positions shown; findings below may reference images not displayed]

FINDINGS: Written informed consent was obtained.  I explained
procedure to the patient in detail.  Prior attempts were made of a
lumbar puncture in the emergency department.

An appropriate site for lumbar puncture was marked on the patient's
skin at L4.  The patient was prepped and draped in the usual
sterile fashion and local anesthesia was achieved with 1%
Xylocaine.  A 20 gauge spinal needle was then inserted down into
the thecal sac under fluoroscopic guidance with subsequent free
flow of clear CSF.  Approximately 9 ml was obtained for appropriate
laboratory evaluation.

The the patient tolerated the procedure well without immediate
complications.
IMPRESSION: Fluoroscopic guided lumbar puncture with 9 ml of clear CSF obtained
for appropriate laboratory evaluation.

## 2008-10-20 ENCOUNTER — Ambulatory Visit: Payer: Self-pay | Admitting: Internal Medicine

## 2008-10-20 ENCOUNTER — Inpatient Hospital Stay (HOSPITAL_COMMUNITY): Admission: EM | Admit: 2008-10-20 | Discharge: 2008-10-25 | Payer: Self-pay | Admitting: Emergency Medicine

## 2008-10-20 IMAGING — CR DG CHEST 1V PORT
1 series · 1 of 1 positions shown · non-contrast
Comparison: [DATE] study

CLINICAL DATA: History given of PICC placement with history of
pneumonia.

PORTABLE CHEST - 1 VIEW

[view not recorded]
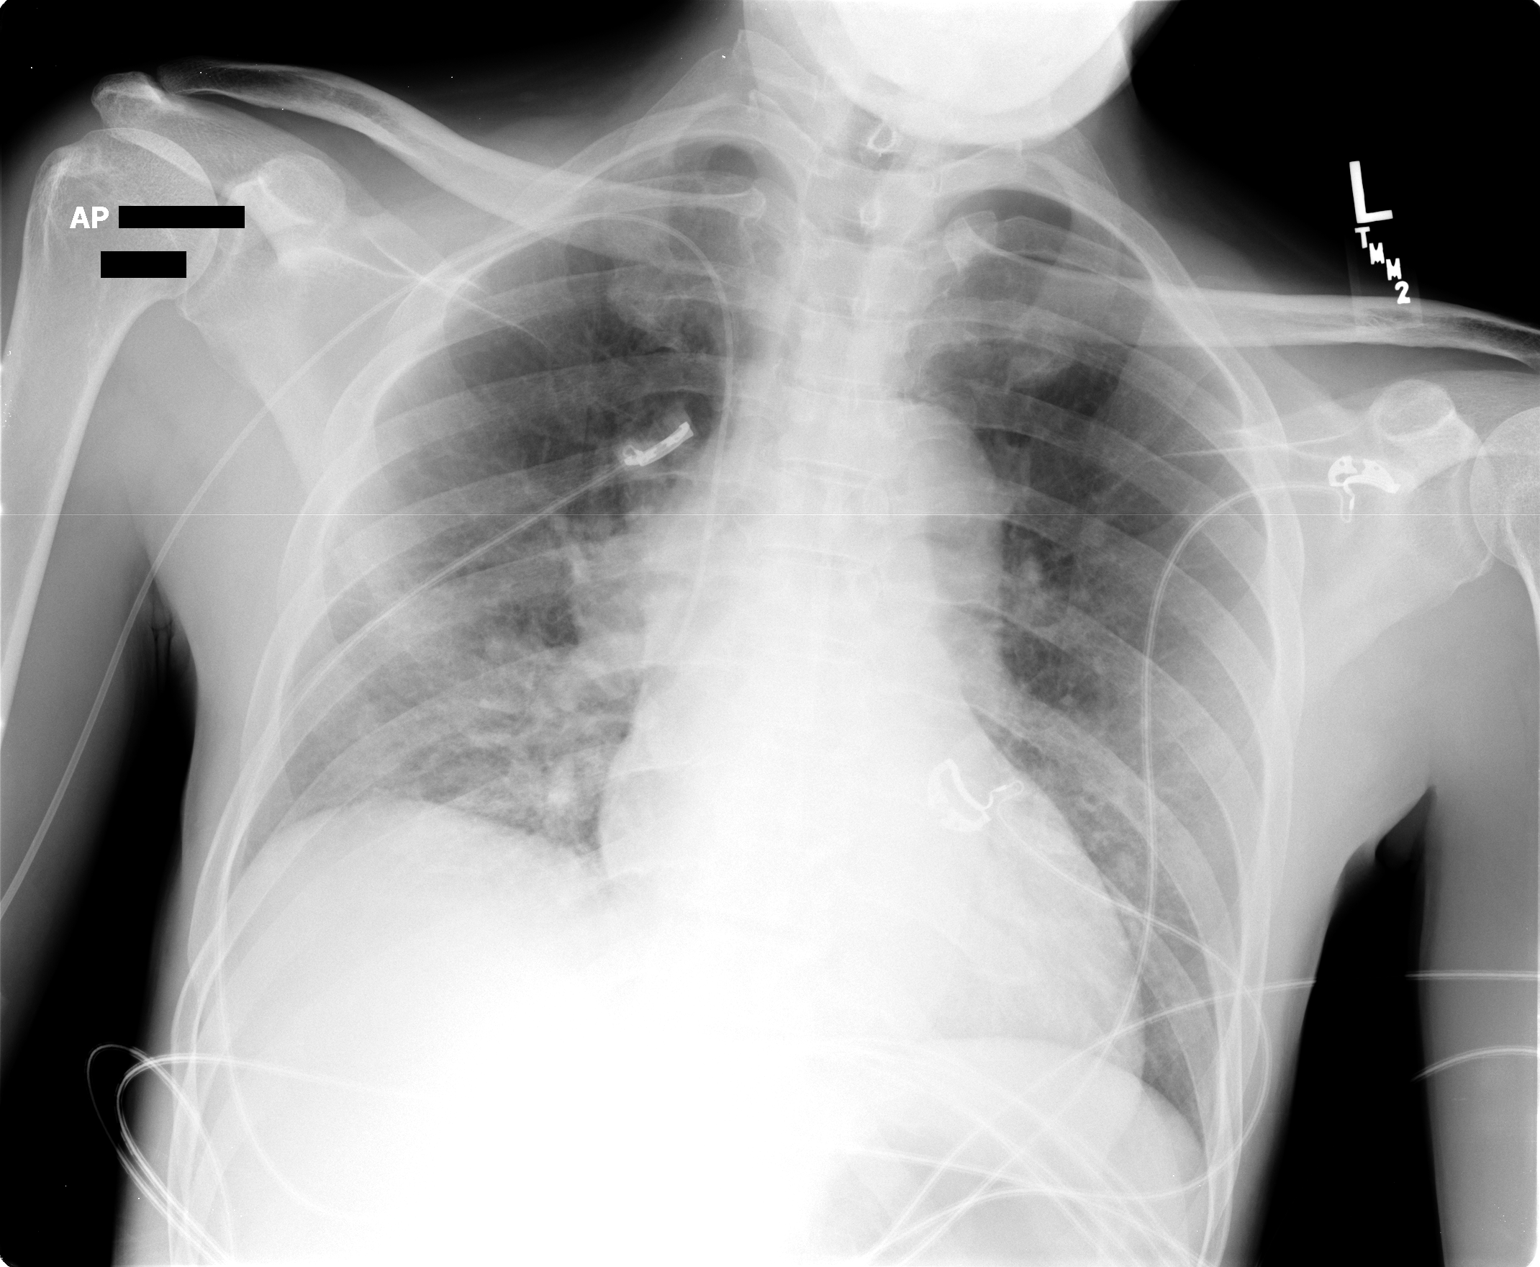

[1 of 1 positions shown; findings below may reference images not displayed]

FINDINGS: PICC has been inserted from the right.  The tip is in the
proximal superior vena cava.  No pneumothorax is evident.  Cardiac
silhouette is upper normal in size.  There is elevation of the
right hemidiaphragm with atelectasis and patchy infiltrative
density within the right
lower lung.  Left lung is free of infiltrates.  No pleural
effusions are seen.
IMPRESSION: PICC has been inserted from the right.  The tip is in the proximal
superior vena cava.  No pneumothorax is evident.  There is
elevation of the right hemidiaphragm with atelectasis and patchy
infiltrative density seen within the right lower lung.

## 2008-10-21 ENCOUNTER — Encounter: Payer: Self-pay | Admitting: Infectious Disease

## 2008-10-22 IMAGING — CR DG CHEST 2V
2 series · 2 of 2 positions shown · non-contrast
Comparison: [DATE]

CLINICAL DATA: PCP pneumonia

CHEST - 2 VIEW

[w chest pa]
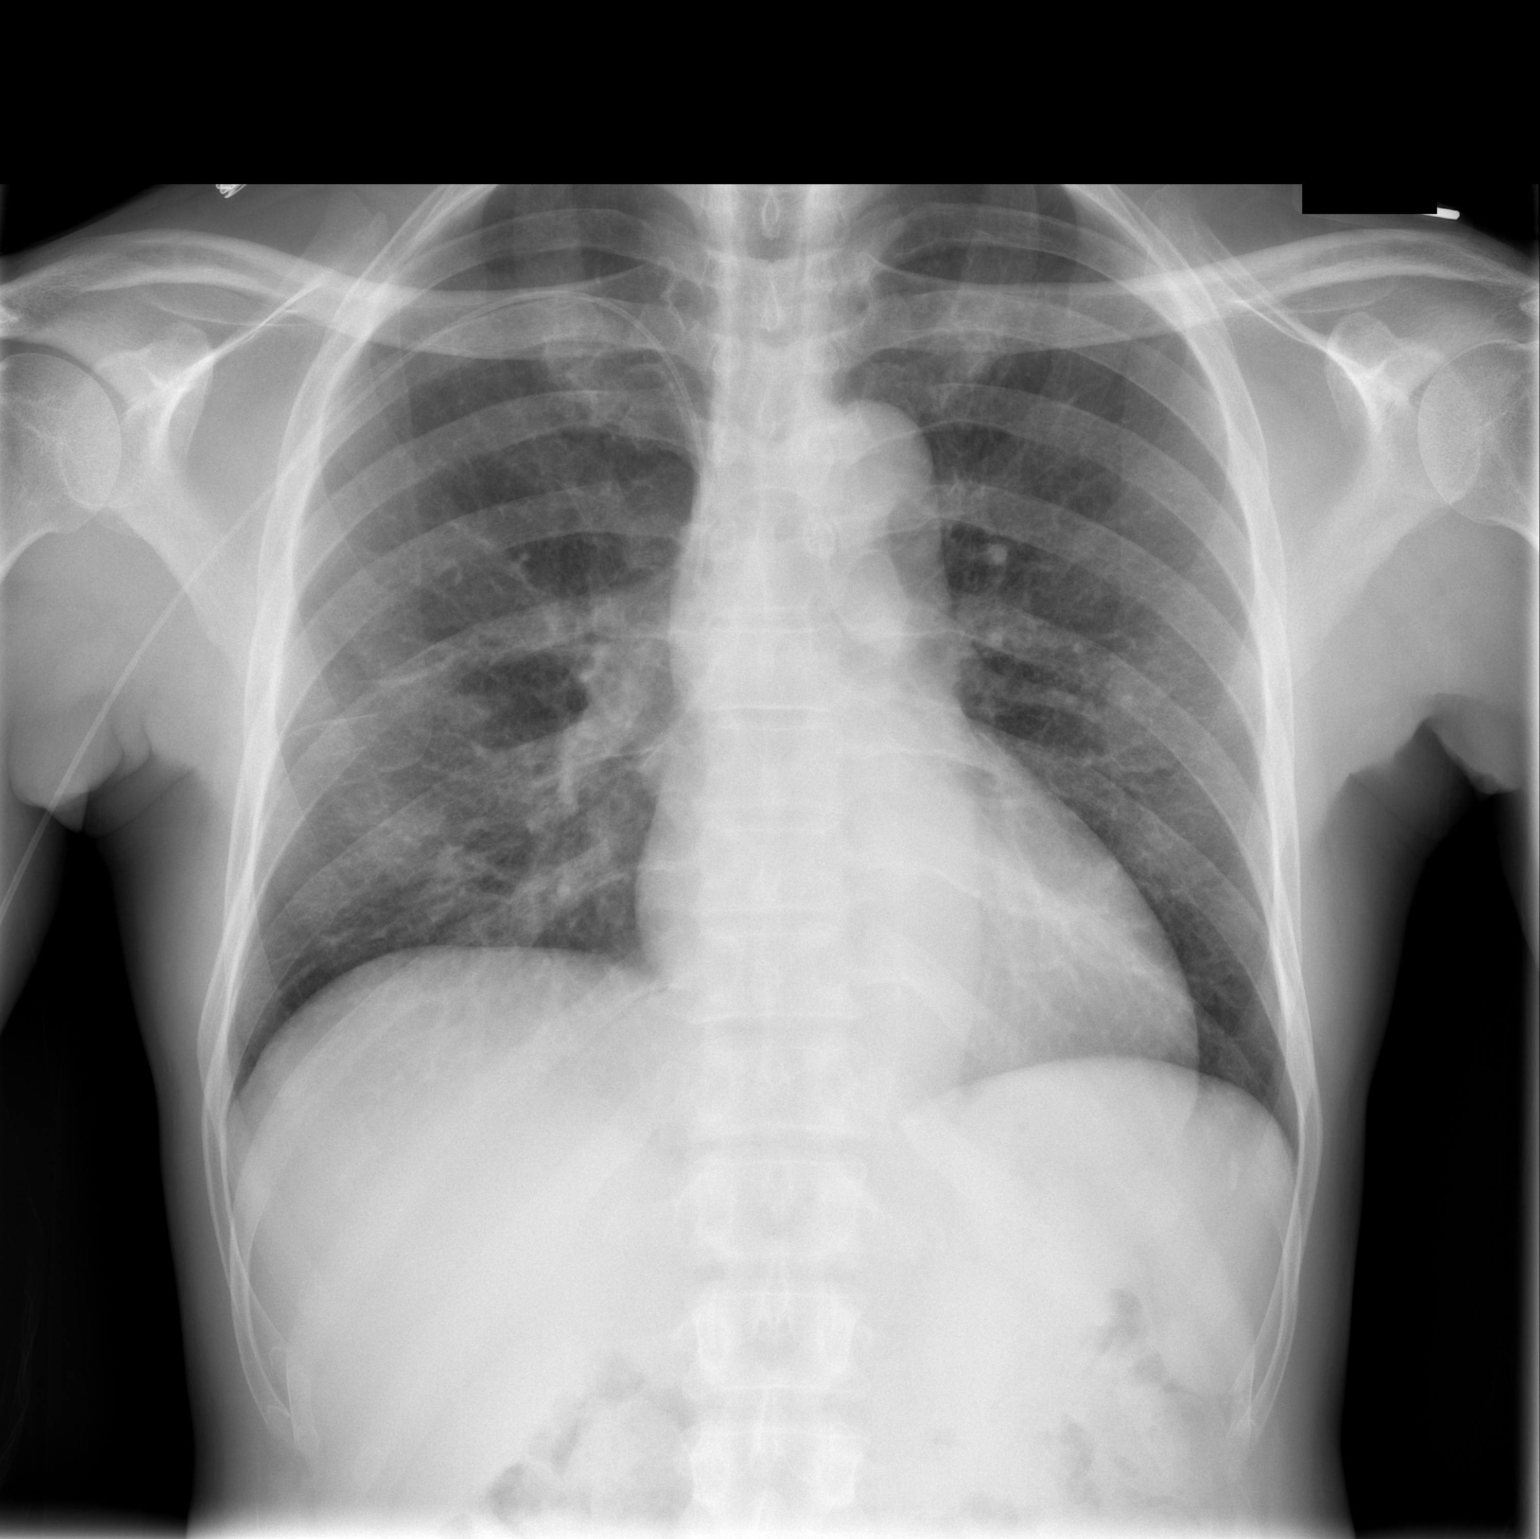

[w chest lat]
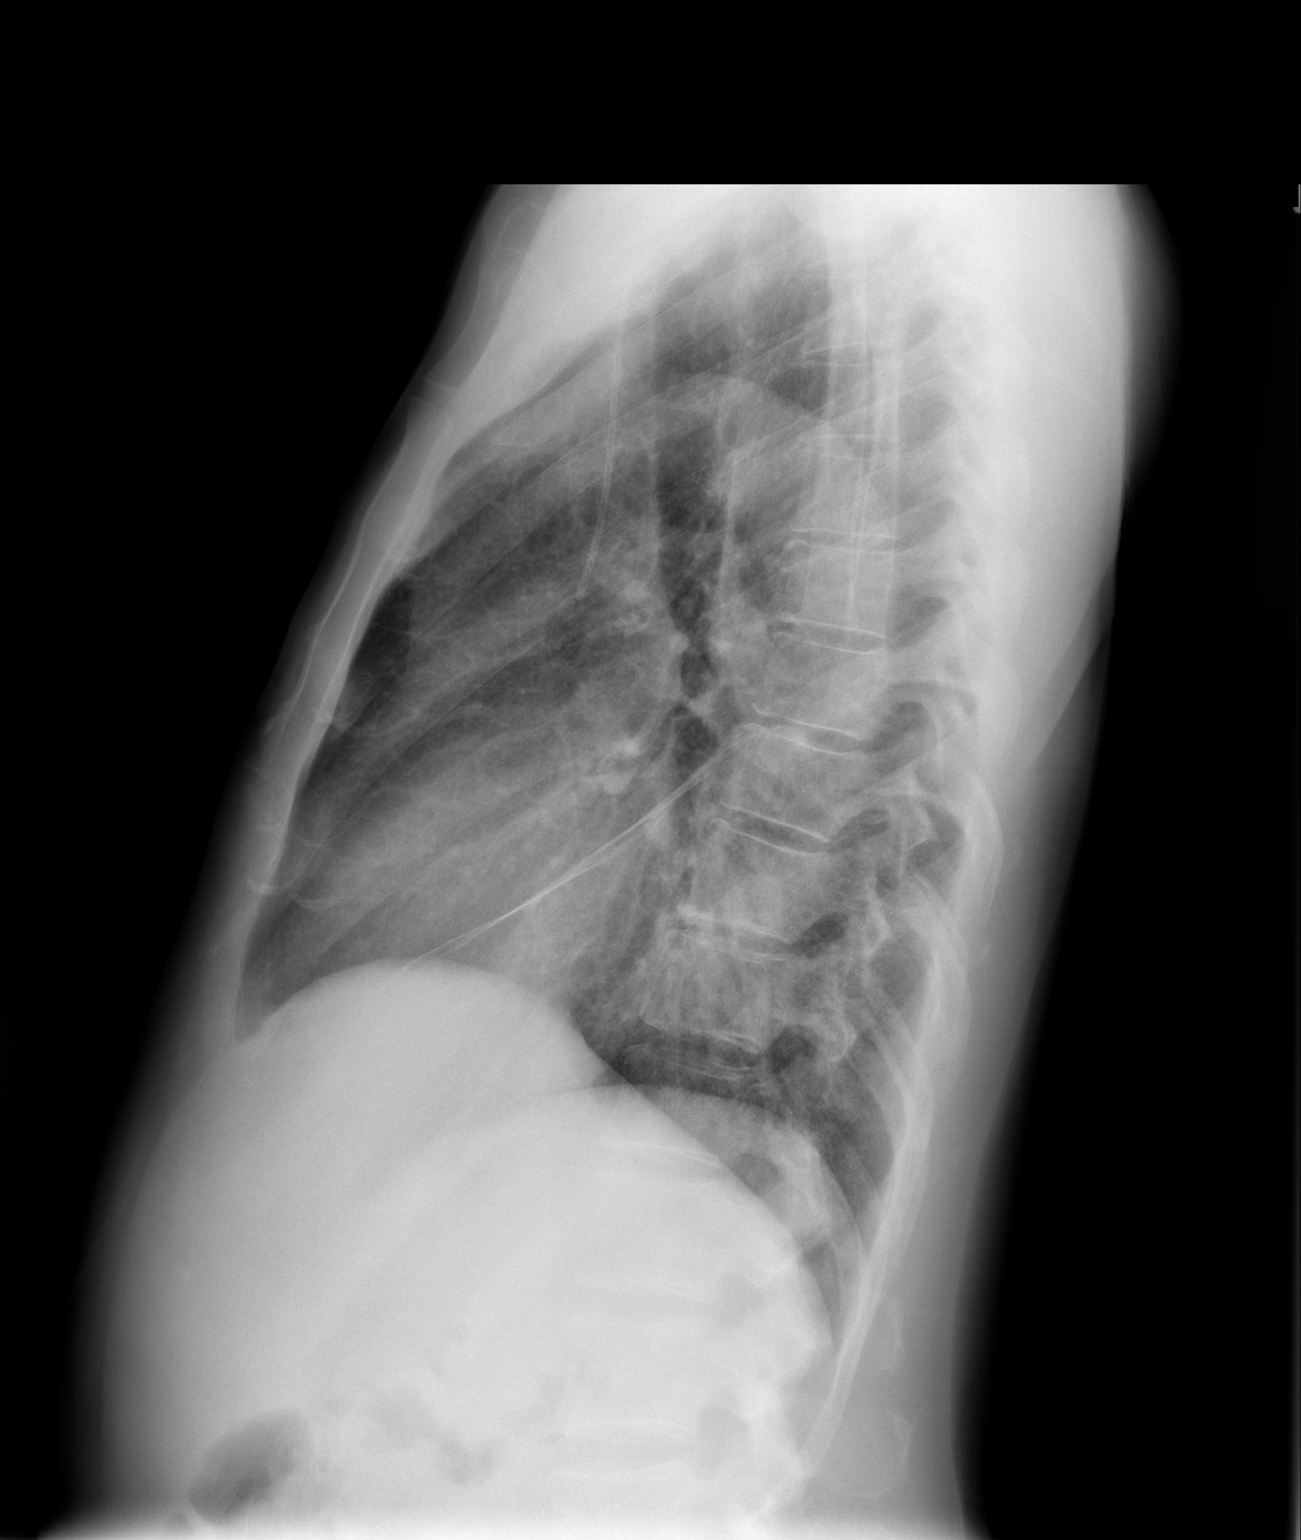

[2 of 2 positions shown; findings below may reference images not displayed]

FINDINGS: There is a prominent markings in the right lower lobe but
no distinct infiltrate.  This may represent residua of prior active
disease.  PICC line stable in position.
IMPRESSION: Prominent right lower lobe markings without distinct infiltrate.

## 2008-10-25 ENCOUNTER — Encounter (INDEPENDENT_AMBULATORY_CARE_PROVIDER_SITE_OTHER): Payer: Self-pay | Admitting: Internal Medicine

## 2008-11-09 ENCOUNTER — Encounter: Payer: Self-pay | Admitting: Internal Medicine

## 2008-11-20 ENCOUNTER — Telehealth (INDEPENDENT_AMBULATORY_CARE_PROVIDER_SITE_OTHER): Payer: Self-pay | Admitting: *Deleted

## 2008-12-09 ENCOUNTER — Encounter: Payer: Self-pay | Admitting: Infectious Disease

## 2008-12-10 ENCOUNTER — Encounter: Payer: Self-pay | Admitting: Infectious Disease

## 2008-12-15 ENCOUNTER — Telehealth (INDEPENDENT_AMBULATORY_CARE_PROVIDER_SITE_OTHER): Payer: Self-pay | Admitting: *Deleted

## 2008-12-17 ENCOUNTER — Ambulatory Visit: Payer: Self-pay | Admitting: Infectious Disease

## 2008-12-17 ENCOUNTER — Ambulatory Visit (HOSPITAL_COMMUNITY): Admission: RE | Admit: 2008-12-17 | Discharge: 2008-12-17 | Payer: Self-pay | Admitting: Infectious Disease

## 2008-12-17 DIAGNOSIS — B459 Cryptococcosis, unspecified: Secondary | ICD-10-CM | POA: Insufficient documentation

## 2008-12-17 DIAGNOSIS — R059 Cough, unspecified: Secondary | ICD-10-CM | POA: Insufficient documentation

## 2008-12-17 DIAGNOSIS — R05 Cough: Secondary | ICD-10-CM

## 2008-12-17 DIAGNOSIS — R64 Cachexia: Secondary | ICD-10-CM | POA: Insufficient documentation

## 2008-12-17 DIAGNOSIS — B59 Pneumocystosis: Secondary | ICD-10-CM | POA: Insufficient documentation

## 2008-12-17 LAB — CONVERTED CEMR LAB
ALT: 33 units/L (ref 0–53)
AST: 54 units/L — ABNORMAL HIGH (ref 0–37)
Albumin: 3.7 g/dL (ref 3.5–5.2)
Alkaline Phosphatase: 117 units/L (ref 39–117)
BUN: 7 mg/dL (ref 6–23)
Basophils Absolute: 0 10*3/uL (ref 0.0–0.1)
Basophils Relative: 0 % (ref 0–1)
CO2: 23 meq/L (ref 19–32)
Calcium: 9.4 mg/dL (ref 8.4–10.5)
Chloride: 102 meq/L (ref 96–112)
Creatinine, Ser: 0.91 mg/dL (ref 0.40–1.50)
Eosinophils Absolute: 0.1 10*3/uL (ref 0.0–0.7)
Eosinophils Relative: 1 % (ref 0–5)
GFR calc Af Amer: >60 mL/min (ref >60)
GFR calc non Af Amer: >60 mL/min (ref >60)
Glucose, Bld: 90 mg/dL (ref 70–99)
HCT: 50.8 % (ref 39.0–52.0)
HIV 1 RNA Quant: 7710 copies/mL — ABNORMAL HIGH (ref <48)
HIV-1 RNA Quant, Log: 3.89 — ABNORMAL HIGH (ref <1.68)
Hemoglobin: 16.7 g/dL (ref 13.0–17.0)
Lymphocytes Relative: 38 % (ref 12–46)
Lymphs Abs: 2.1 10*3/uL (ref 0.7–4.0)
MCHC: 32.9 g/dL (ref 30.0–36.0)
MCV: 93.6 fL (ref 78.0–100.0)
Monocytes Absolute: 0.9 10*3/uL (ref 0.1–1.0)
Monocytes Relative: 17 % — ABNORMAL HIGH (ref 3–12)
Neutro Abs: 2.4 10*3/uL (ref 1.7–7.7)
Neutrophils Relative %: 44 % (ref 43–77)
Platelets: 285 10*3/uL (ref 150–400)
Potassium: 4.2 meq/L (ref 3.5–5.3)
RBC: 5.43 M/uL (ref 4.22–5.81)
RDW: 14.7 % (ref 11.5–15.5)
Sodium: 138 meq/L (ref 135–145)
Total Bilirubin: 0.5 mg/dL (ref 0.3–1.2)
Total Protein: 8.5 g/dL — ABNORMAL HIGH (ref 6.0–8.3)
WBC: 5.4 10*3/uL (ref 4.0–10.5)

## 2008-12-17 IMAGING — CR DG CHEST 2V
2 series · 2 of 2 positions shown · non-contrast
Comparison: Chest x-ray of [DATE]

CLINICAL DATA: Cough, fever for a month history

CHEST - 2 VIEW

[w chest pa]
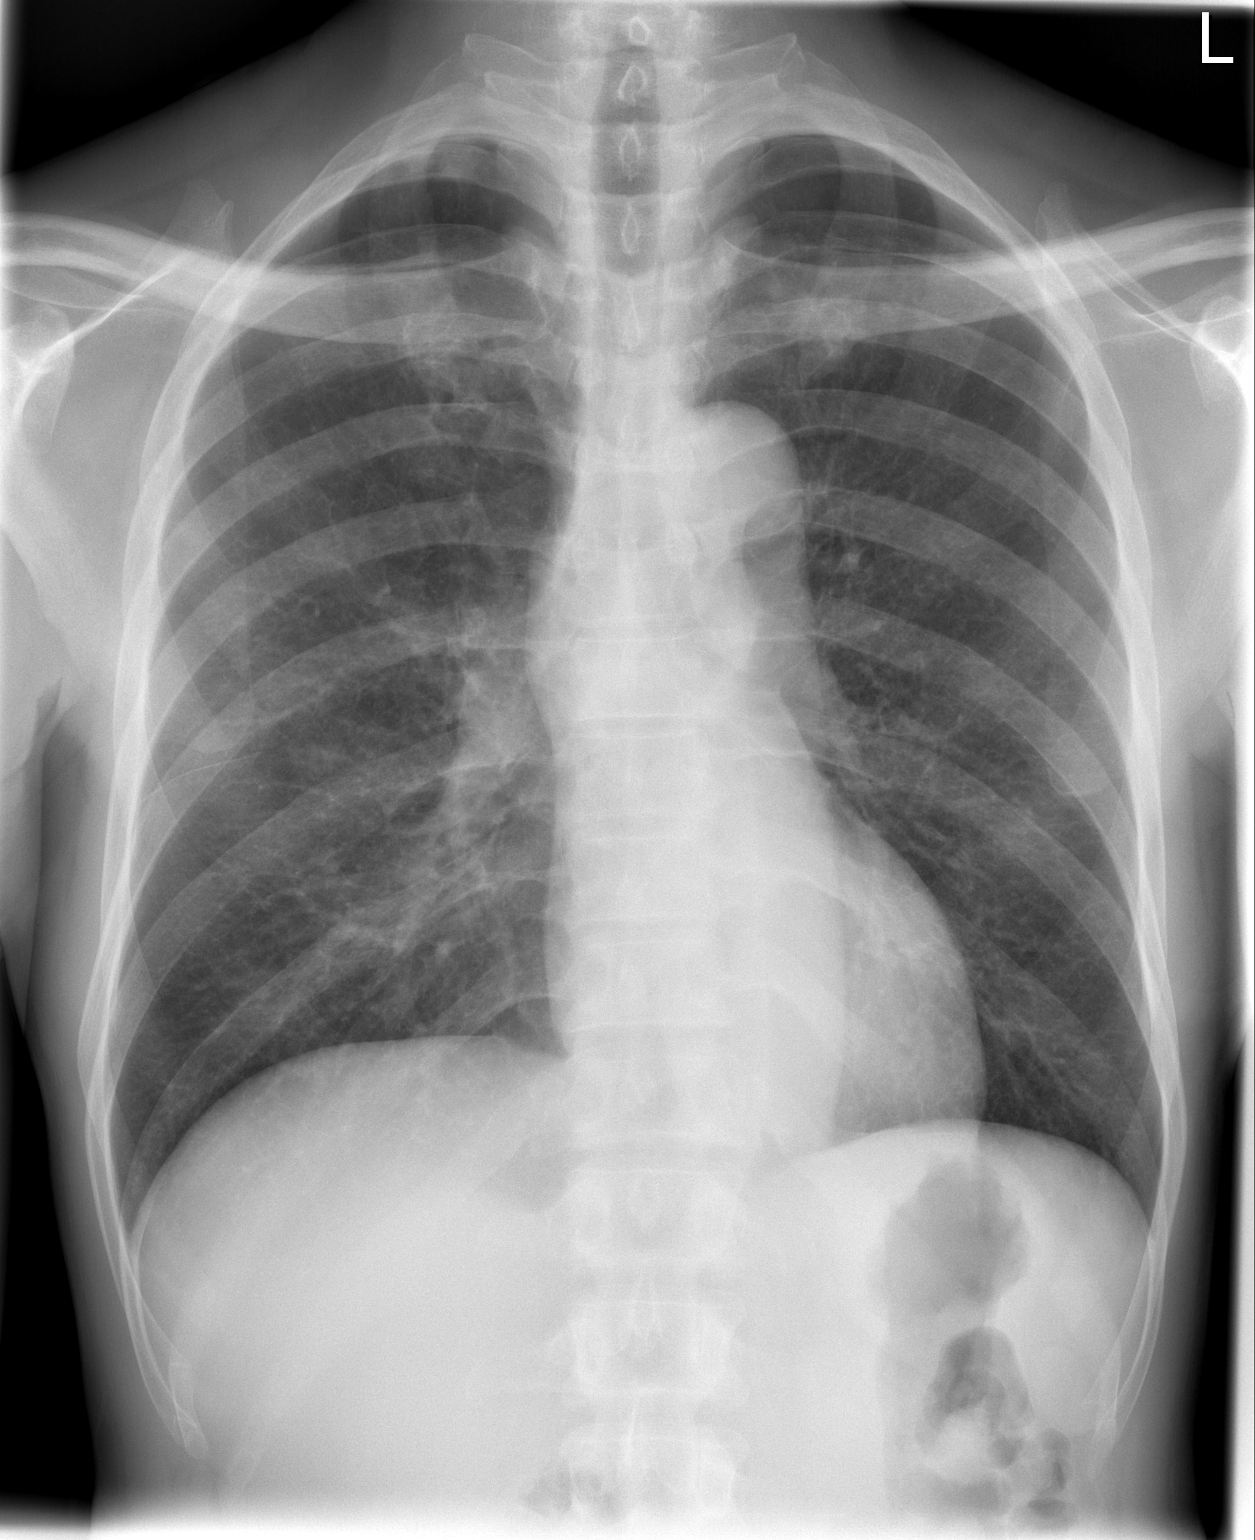

[w chest lat]
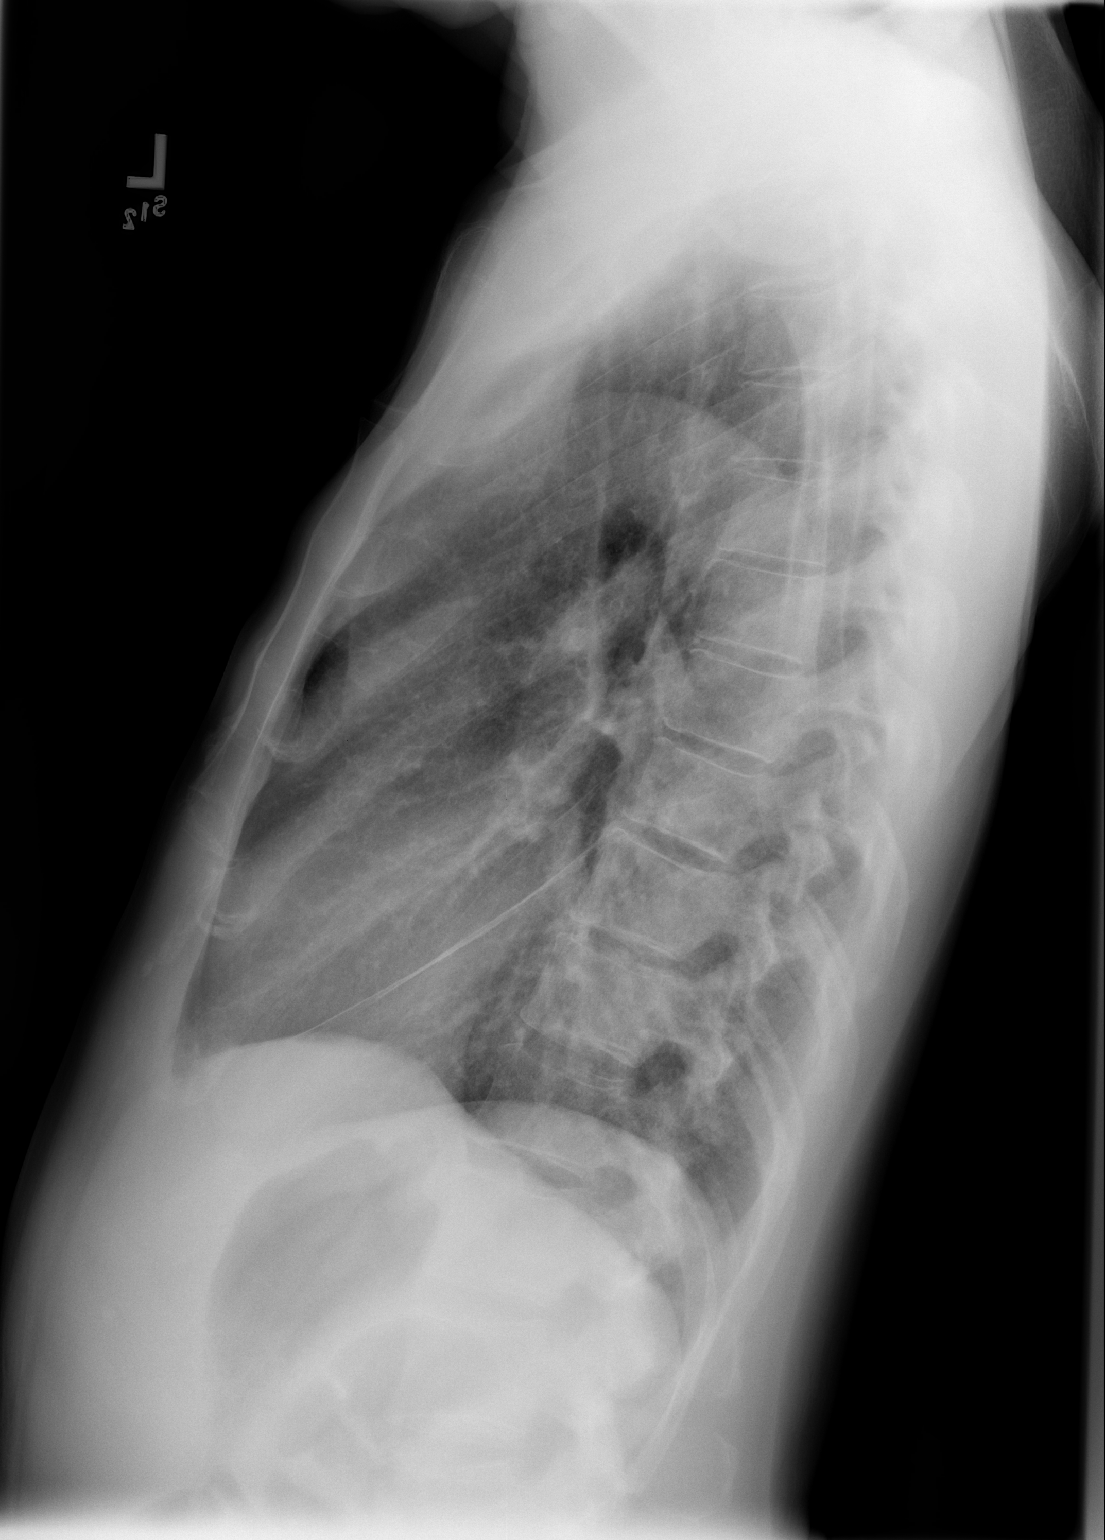

[2 of 2 positions shown; findings below may reference images not displayed]

FINDINGS: No focal infiltrate or effusion is seen.  There are
slightly prominent interstitial markings throughout the lungs and
an early pneumonia, viral or possibly PCP, cannot be excluded.  No
effusion is seen. The heart is within normal limits in size. No
bony abnormality is seen.
IMPRESSION: No definite pneumonia but there are slightly prominent interstitial
markings as - early pneumonia such as viral or possibly PCP cannot
be excluded.  Recommend follow-up.

## 2008-12-18 ENCOUNTER — Telehealth (INDEPENDENT_AMBULATORY_CARE_PROVIDER_SITE_OTHER): Payer: Self-pay | Admitting: *Deleted

## 2009-01-18 ENCOUNTER — Ambulatory Visit: Payer: Self-pay | Admitting: Infectious Disease

## 2009-01-18 ENCOUNTER — Ambulatory Visit (HOSPITAL_COMMUNITY): Admission: RE | Admit: 2009-01-18 | Discharge: 2009-01-18 | Payer: Self-pay | Admitting: Infectious Disease

## 2009-01-18 DIAGNOSIS — E291 Testicular hypofunction: Secondary | ICD-10-CM | POA: Insufficient documentation

## 2009-01-18 LAB — CONVERTED CEMR LAB
ALT: 32 units/L (ref 0–53)
AST: 46 units/L — ABNORMAL HIGH (ref 0–37)
Albumin: 3.7 g/dL (ref 3.5–5.2)
Alkaline Phosphatase: 103 units/L (ref 39–117)
BUN: 10 mg/dL (ref 6–23)
Basophils Absolute: 0 10*3/uL (ref 0.0–0.1)
Basophils Relative: 1 % (ref 0–1)
CO2: 24 meq/L (ref 19–32)
Calcium: 10.1 mg/dL (ref 8.4–10.5)
Chloride: 97 meq/L (ref 96–112)
Cholesterol: 109 mg/dL (ref 0–200)
Creatinine, Ser: 0.93 mg/dL (ref 0.40–1.50)
Eosinophils Absolute: 0.2 10*3/uL (ref 0.0–0.7)
Eosinophils Relative: 2 % (ref 0–5)
GFR calc Af Amer: >60 mL/min (ref >60)
GFR calc non Af Amer: >60 mL/min (ref >60)
Glucose, Bld: 97 mg/dL (ref 70–99)
HCT: 49 % (ref 39.0–52.0)
HDL: 46 mg/dL (ref >39)
HIV 1 RNA Quant: 75 copies/mL — ABNORMAL HIGH (ref <48)
HIV-1 RNA Quant, Log: 1.88 — ABNORMAL HIGH (ref <1.68)
Hemoglobin: 16.7 g/dL (ref 13.0–17.0)
LDL Cholesterol: 37 mg/dL (ref 0–99)
Lymphocytes Relative: 23 % (ref 12–46)
Lymphs Abs: 1.8 10*3/uL (ref 0.7–4.0)
MCHC: 34.1 g/dL (ref 30.0–36.0)
MCV: 87.7 fL (ref 78.0–100.0)
Monocytes Absolute: 1.1 10*3/uL — ABNORMAL HIGH (ref 0.1–1.0)
Monocytes Relative: 14 % — ABNORMAL HIGH (ref 3–12)
Neutro Abs: 4.8 10*3/uL (ref 1.7–7.7)
Neutrophils Relative %: 61 % (ref 43–77)
Platelets: 319 10*3/uL (ref 150–400)
Potassium: 4.2 meq/L (ref 3.5–5.3)
RBC: 5.59 M/uL (ref 4.22–5.81)
RDW: 13.7 % (ref 11.5–15.5)
Sodium: 135 meq/L (ref 135–145)
Total Bilirubin: 0.7 mg/dL (ref 0.3–1.2)
Total CHOL/HDL Ratio: 2.4
Total Protein: 8.6 g/dL — ABNORMAL HIGH (ref 6.0–8.3)
Triglycerides: 130 mg/dL (ref <150)
VLDL: 26 mg/dL (ref 0–40)
WBC: 7.9 10*3/uL (ref 4.0–10.5)

## 2009-01-18 IMAGING — CR DG CHEST 2V
2 series · 2 of 2 positions shown · non-contrast
Comparison: Chest [DATE].

CLINICAL DATA: Cough.  Shortness of breath.

CHEST - 2 VIEW

[w chest pa]
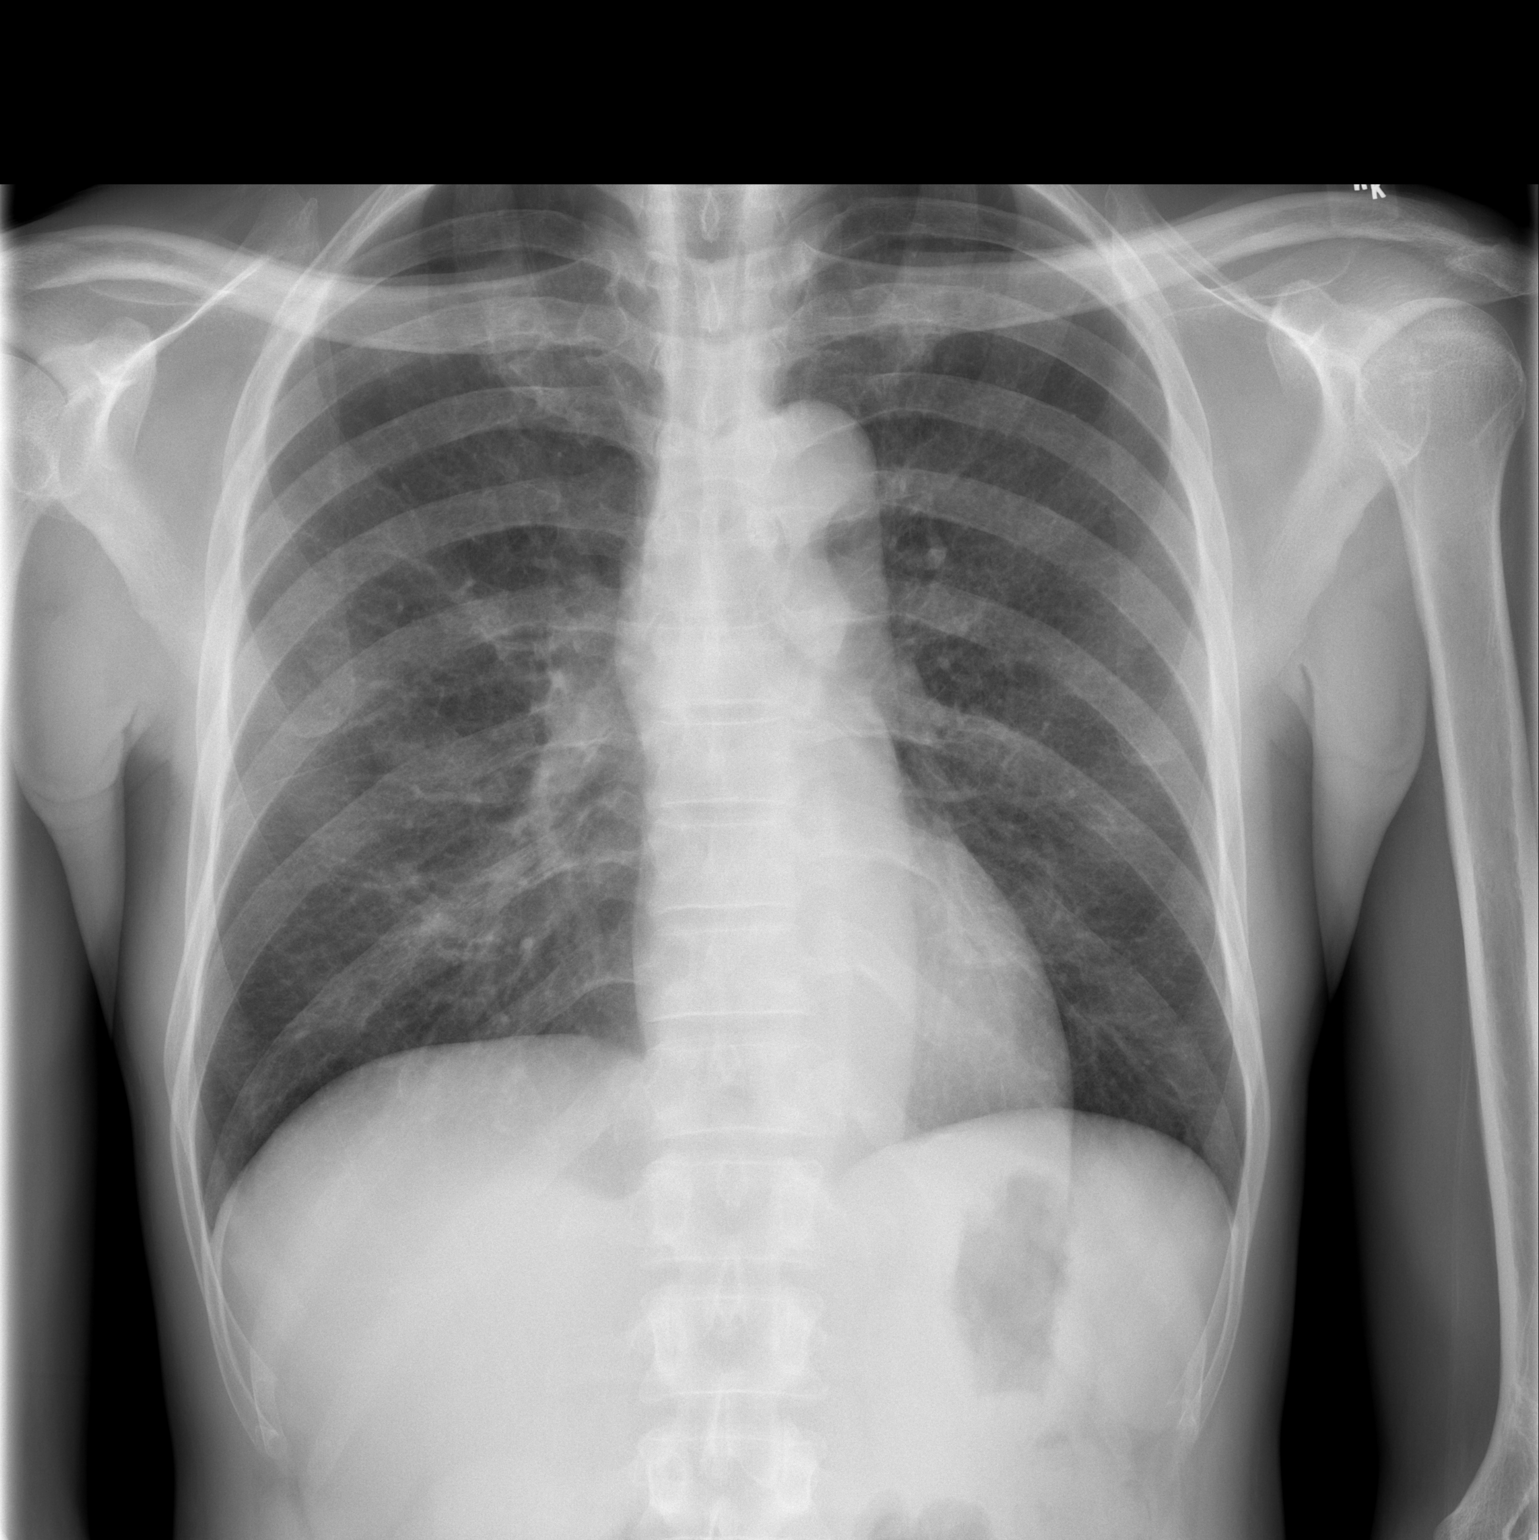

[w chest lat]
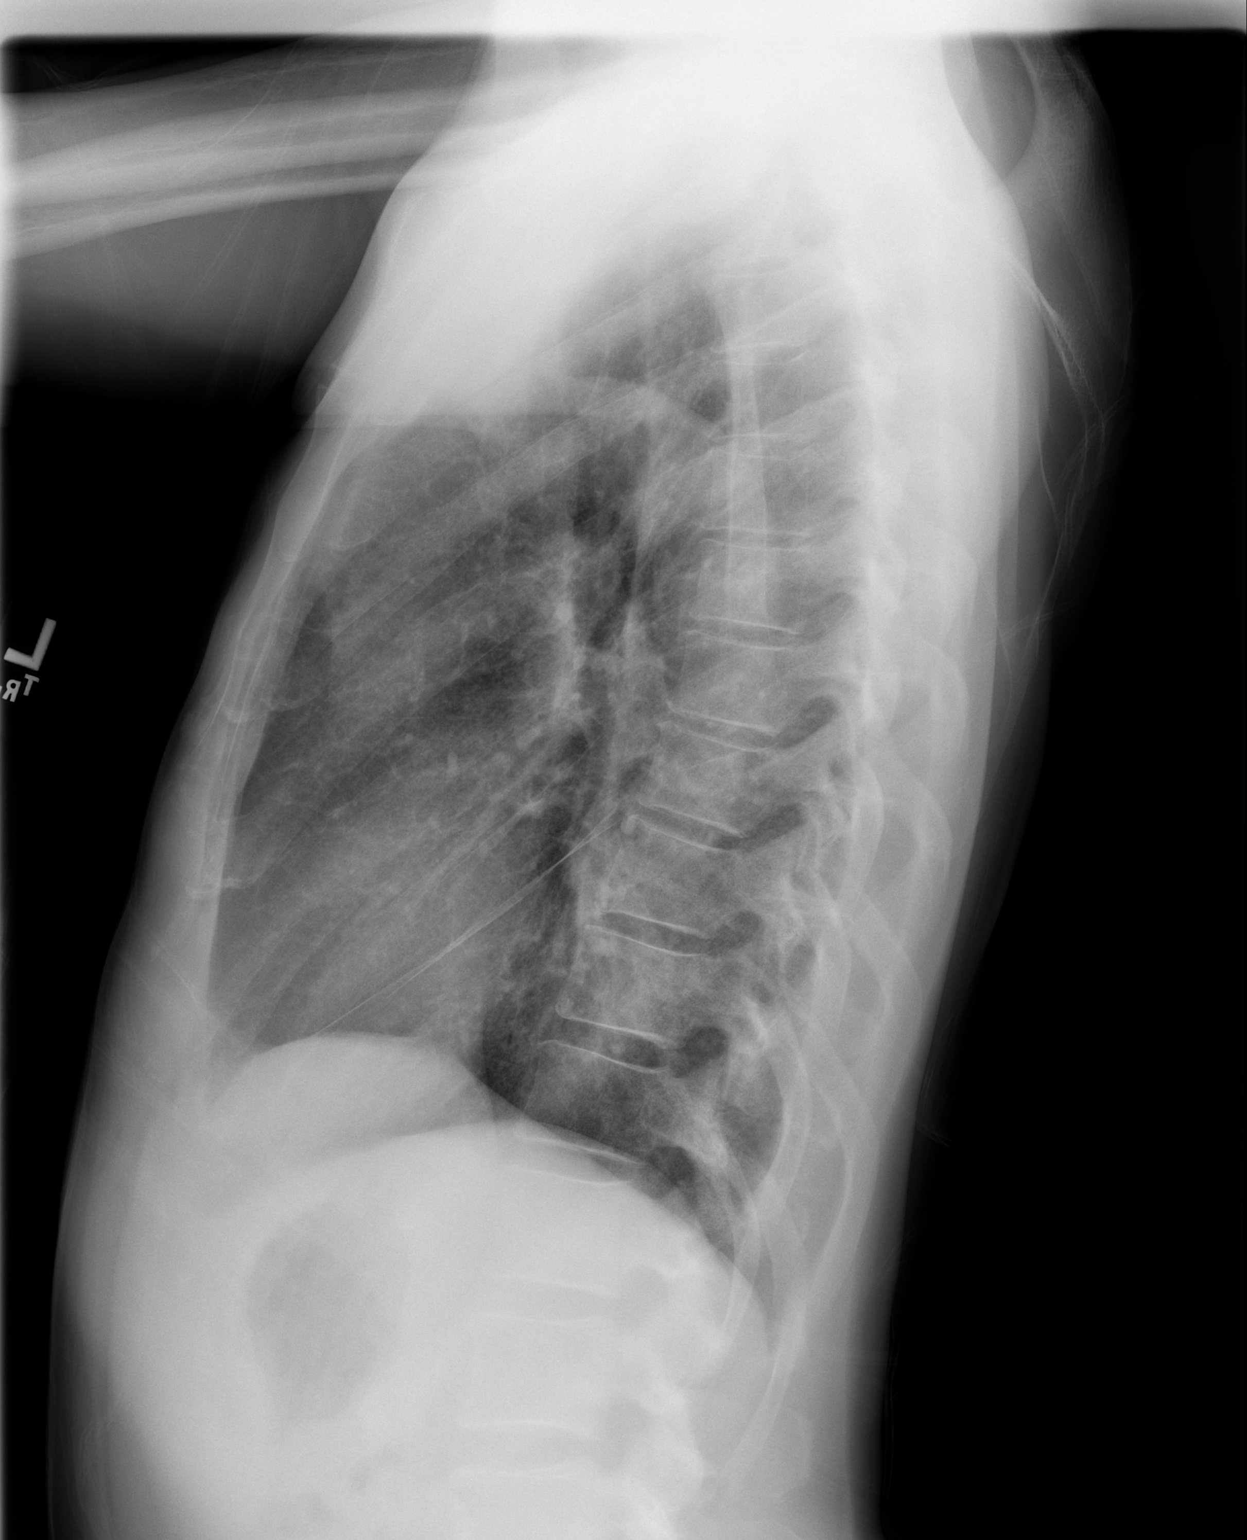

[2 of 2 positions shown; findings below may reference images not displayed]

FINDINGS: Mild prominence of the pulmonary interstitium appears
unchanged.  There is no focal airspace disease or effusion.  Heart
size normal.  No focal bony abnormality.
IMPRESSION: Unchanged mild prominence the pulmonary interstitium.  No focal
process.

## 2009-01-21 ENCOUNTER — Telehealth: Payer: Self-pay | Admitting: Infectious Disease

## 2009-01-27 ENCOUNTER — Telehealth: Payer: Self-pay | Admitting: Infectious Disease

## 2009-02-18 ENCOUNTER — Ambulatory Visit: Payer: Self-pay | Admitting: Infectious Disease

## 2009-02-18 LAB — CONVERTED CEMR LAB
ALT: 24 units/L (ref 0–53)
AST: 36 units/L (ref 0–37)
Albumin: 3.5 g/dL (ref 3.5–5.2)
Alkaline Phosphatase: 94 units/L (ref 39–117)
BUN: 8 mg/dL (ref 6–23)
Basophils Absolute: 0 10*3/uL (ref 0.0–0.1)
Basophils Relative: 1 % (ref 0–1)
CO2: 25 meq/L (ref 19–32)
Calcium: 9.3 mg/dL (ref 8.4–10.5)
Chloride: 99 meq/L (ref 96–112)
Creatinine, Ser: 1.13 mg/dL (ref 0.40–1.50)
Eosinophils Absolute: 0.1 10*3/uL (ref 0.0–0.7)
Eosinophils Relative: 3 % (ref 0–5)
Glucose, Bld: 83 mg/dL (ref 70–99)
HCT: 46.6 % (ref 39.0–52.0)
HIV 1 RNA Quant: <48 copies/mL (ref <48)
HIV-1 RNA Quant, Log: <1.68 (ref <1.68)
Hemoglobin: 15.4 g/dL (ref 13.0–17.0)
Lymphocytes Relative: 29 % (ref 12–46)
Lymphs Abs: 1.2 10*3/uL (ref 0.7–4.0)
MCHC: 33 g/dL (ref 30.0–36.0)
MCV: 82.6 fL (ref 78.0–100.0)
Monocytes Absolute: 1 10*3/uL (ref 0.1–1.0)
Monocytes Relative: 23 % — ABNORMAL HIGH (ref 3–12)
Neutro Abs: 1.9 10*3/uL (ref 1.7–7.7)
Neutrophils Relative %: 45 % (ref 43–77)
Platelets: 309 10*3/uL (ref 150–400)
Potassium: 4.3 meq/L (ref 3.5–5.3)
RBC: 5.64 M/uL (ref 4.22–5.81)
RDW: 13.8 % (ref 11.5–15.5)
Sodium: 135 meq/L (ref 135–145)
Total Bilirubin: 0.7 mg/dL (ref 0.3–1.2)
Total Protein: 7.7 g/dL (ref 6.0–8.3)
WBC: 4.3 10*3/uL (ref 4.0–10.5)

## 2009-03-02 ENCOUNTER — Telehealth: Payer: Self-pay | Admitting: Infectious Disease

## 2009-03-08 ENCOUNTER — Ambulatory Visit: Payer: Self-pay | Admitting: Infectious Disease

## 2009-03-08 DIAGNOSIS — R197 Diarrhea, unspecified: Secondary | ICD-10-CM | POA: Insufficient documentation

## 2009-03-08 LAB — CONVERTED CEMR LAB
BUN: 4 mg/dL — ABNORMAL LOW (ref 6–23)
CO2: 28 meq/L (ref 19–32)
Calcium: 9.5 mg/dL (ref 8.4–10.5)
Chloride: 101 meq/L (ref 96–112)
Creatinine, Ser: 0.9 mg/dL (ref 0.40–1.50)
Glucose, Bld: 52 mg/dL — ABNORMAL LOW (ref 70–99)
HCT: 49.6 % (ref 39.0–52.0)
Hemoglobin: 16.5 g/dL (ref 13.0–17.0)
MCHC: 33.3 g/dL (ref 30.0–36.0)
MCV: 85 fL (ref >=78.0)
Platelets: 331 10*3/uL (ref 150–400)
Potassium: 4.4 meq/L (ref 3.5–5.3)
RBC Folate: 470 ng/mL (ref 180–600)
RBC: 5.84 M/uL — ABNORMAL HIGH (ref 4.22–5.81)
RDW: 14.8 % (ref 11.5–15.5)
Sodium: 135 meq/L (ref 135–145)
Vitamin B-12: 981 pg/mL — ABNORMAL HIGH (ref 211–911)
WBC: 4.1 10*3/uL (ref 4.0–10.5)

## 2009-03-15 ENCOUNTER — Encounter: Payer: Self-pay | Admitting: Infectious Disease

## 2009-03-16 ENCOUNTER — Telehealth: Payer: Self-pay | Admitting: Infectious Disease

## 2009-03-16 ENCOUNTER — Encounter: Payer: Self-pay | Admitting: Internal Medicine

## 2009-04-05 ENCOUNTER — Encounter: Payer: Self-pay | Admitting: Infectious Disease

## 2009-04-20 ENCOUNTER — Telehealth: Payer: Self-pay | Admitting: Infectious Disease

## 2009-04-20 ENCOUNTER — Encounter: Payer: Self-pay | Admitting: Infectious Disease

## 2009-04-21 ENCOUNTER — Ambulatory Visit: Payer: Self-pay | Admitting: Infectious Disease

## 2009-04-21 LAB — CONVERTED CEMR LAB
ALT: 39 units/L (ref 0–53)
AST: 41 units/L — ABNORMAL HIGH (ref 0–37)
Albumin: 3.7 g/dL (ref 3.5–5.2)
Alkaline Phosphatase: 97 units/L (ref 39–117)
BUN: 13 mg/dL (ref 6–23)
CO2: 19 meq/L (ref 19–32)
Calcium: 9.1 mg/dL (ref 8.4–10.5)
Chloride: 104 meq/L (ref 96–112)
Cholesterol: 102 mg/dL (ref 0–200)
Creatinine, Ser: 0.85 mg/dL (ref 0.40–1.50)
Glucose, Bld: 93 mg/dL (ref 70–99)
HDL: 43 mg/dL (ref >39)
HIV 1 RNA Quant: 159 copies/mL — ABNORMAL HIGH (ref <48)
HIV-1 RNA Quant, Log: 2.2 — ABNORMAL HIGH (ref <1.68)
LDL Cholesterol: 39 mg/dL (ref 0–99)
Potassium: 3.8 meq/L (ref 3.5–5.3)
Sodium: 137 meq/L (ref 135–145)
Total Bilirubin: 0.7 mg/dL (ref 0.3–1.2)
Total CHOL/HDL Ratio: 2.4
Total Protein: 8.1 g/dL (ref 6.0–8.3)
Triglycerides: 99 mg/dL (ref <150)
VLDL: 20 mg/dL (ref 0–40)

## 2009-05-03 ENCOUNTER — Encounter: Payer: Self-pay | Admitting: Infectious Disease

## 2009-05-05 ENCOUNTER — Encounter: Payer: Self-pay | Admitting: Infectious Disease

## 2009-05-05 ENCOUNTER — Telehealth: Payer: Self-pay | Admitting: Infectious Disease

## 2009-05-10 ENCOUNTER — Ambulatory Visit: Payer: Self-pay | Admitting: Infectious Disease

## 2009-05-10 DIAGNOSIS — R000 Tachycardia, unspecified: Secondary | ICD-10-CM | POA: Insufficient documentation

## 2009-05-10 DIAGNOSIS — R0789 Other chest pain: Secondary | ICD-10-CM | POA: Insufficient documentation

## 2009-05-13 ENCOUNTER — Encounter: Payer: Self-pay | Admitting: Infectious Disease

## 2009-05-14 ENCOUNTER — Ambulatory Visit (HOSPITAL_COMMUNITY): Admission: RE | Admit: 2009-05-14 | Discharge: 2009-05-14 | Payer: Self-pay | Admitting: Infectious Disease

## 2009-05-14 IMAGING — CT CT CHEST W/ CM
2 of 3 series · 15 of 36 positions shown, 18 images · IV contrast (agent unspecified)
Comparison: None

CLINICAL DATA: Chronic cough.  Smoker.

CT CHEST WITH CONTRAST
TECHNIQUE: Multidetector CT imaging of the chest was performed
following the standard protocol during bolus administration of
intravenous contrast.
Contrast: 100 ml of omni 300

[Series 3: routine chest 5.0 st · axial · 0.62mm/px · z∈[+1222,+1506]mm · 12 of 67 slices shown, 15 images]
[im 5/67  mediastinal]
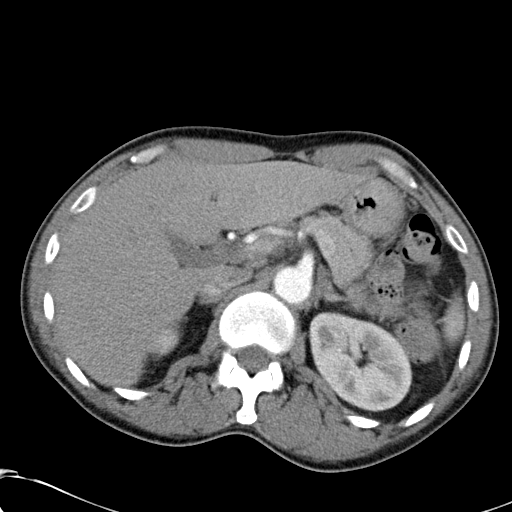
[im 5/67  lung]
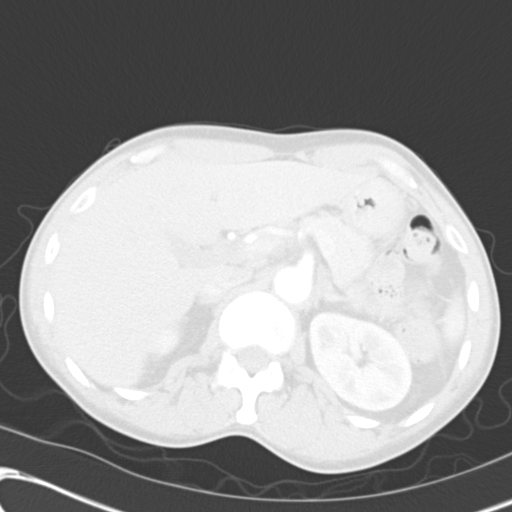
[im 10/67  lung]
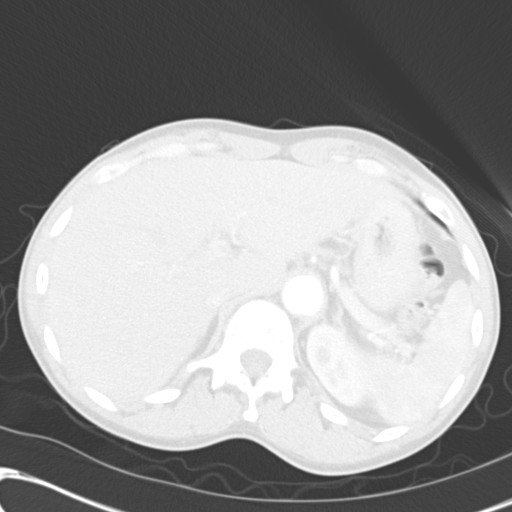
[im 15/67  lung]
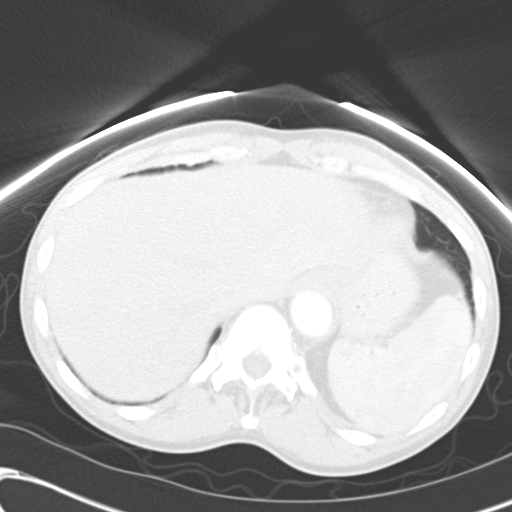
[im 20/67  lung]
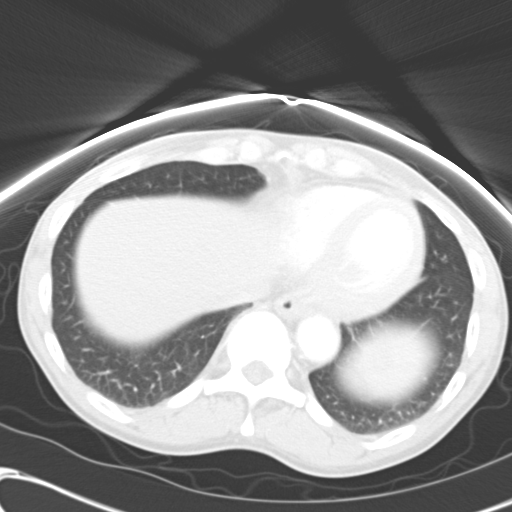
[im 25/67  mediastinal]
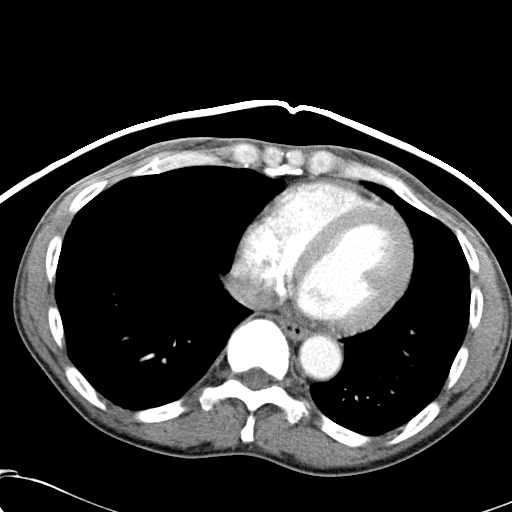
[im 25/67  lung]
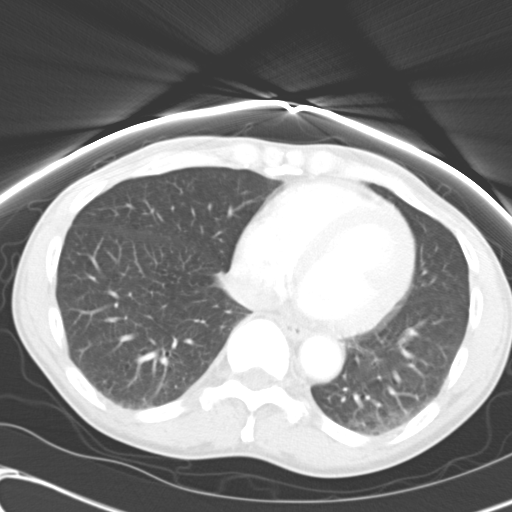
[im 30/67  lung]
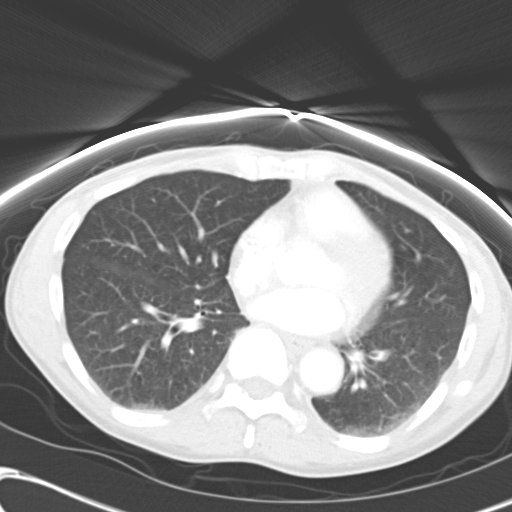
[im 37/67  lung]
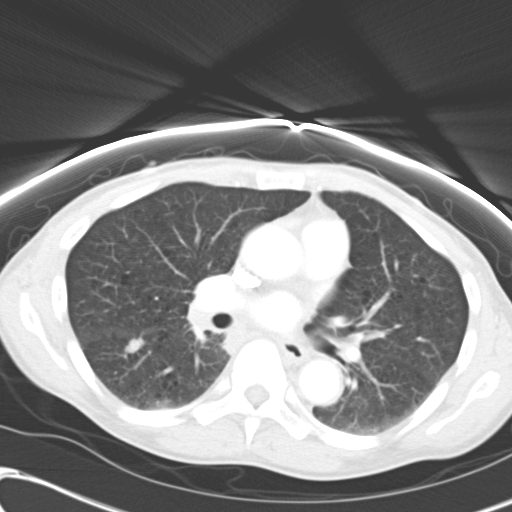
[im 42/67  lung]
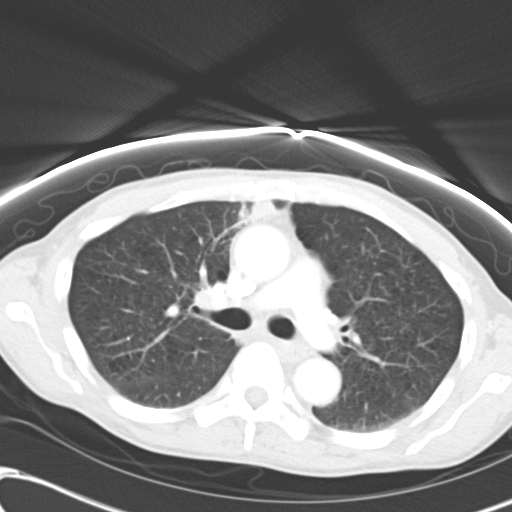
[im 47/67  mediastinal]
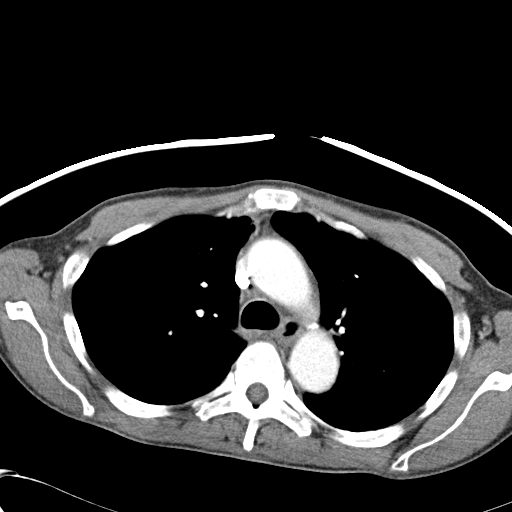
[im 47/67  lung]
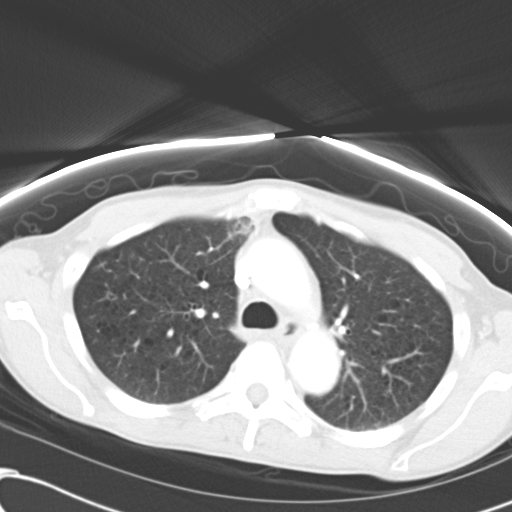
[im 52/67  lung]
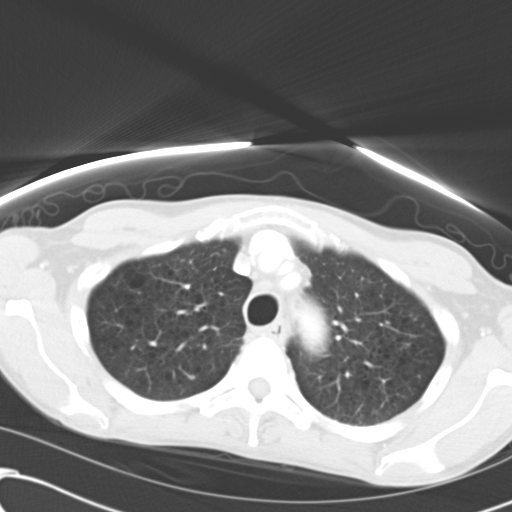
[im 57/67  lung]
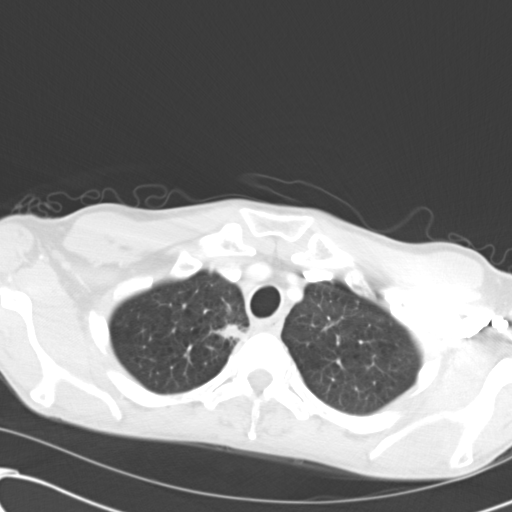
[im 62/67  lung]
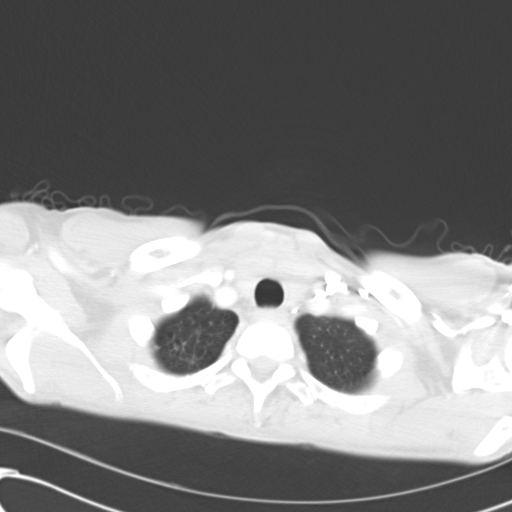

[Series 602: cor chest · coronal · 0.65mm/px · 3 of 90 slices shown]
[im 18/90  lung]
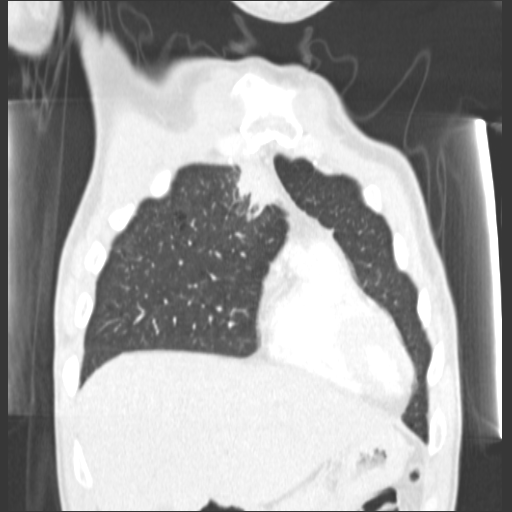
[im 36/90  lung]
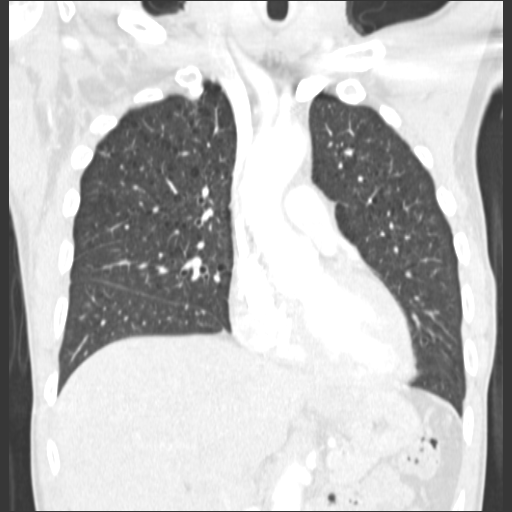
[im 54/90  lung]
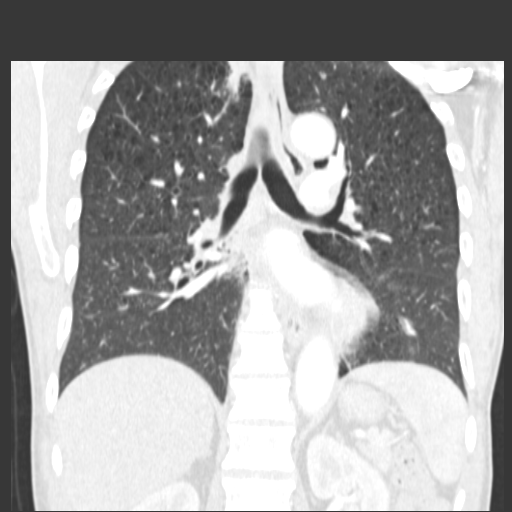

[15 of 36 positions shown; findings below may reference images not displayed]

FINDINGS: There are no enlarged supraclavicular or axillary lymph
nodes.

Enlarged right hilar lymph nodes are identified.  On image 29 lymph
node measures 2.4 x 1.4 cm.  Right infrahilar lymph node measures
1.38 x 1.7 cm.  Lymph node posterior and medial to the right
mainstem bronchus measures 1.2 cm, image 31.

There is no pericardial or pleural effusion.

The lungs are diffusely emphysematous.

Right upper lobe spiculated subpleural mass measures 1.2 x 1.5 cm,
image 9.

Within the medial aspect of the right upper lobe there is a soft
tissue attenuating density measuring 1.7 x 1.5 cm.

Within the superior segment of the right lower lobe there is a
nodule which measures 0.7 x 1.5 cm.

Review of the visualized osseous structures is negative for a
suspicious lytic or sclerotic lesion.
IMPRESSION: 1.  Pulmonary parenchymal densities within the right lung are
suspicious for multifocal areas of bronchogenic carcinoma.  PET CT
and biopsy is recommended for more definitive evaluation.

2.  Suspect right lymph node metastasis.

## 2009-05-16 DIAGNOSIS — R930 Abnormal findings on diagnostic imaging of skull and head, not elsewhere classified: Secondary | ICD-10-CM | POA: Insufficient documentation

## 2009-05-19 ENCOUNTER — Telehealth: Payer: Self-pay | Admitting: Infectious Disease

## 2009-05-26 ENCOUNTER — Telehealth (INDEPENDENT_AMBULATORY_CARE_PROVIDER_SITE_OTHER): Payer: Self-pay | Admitting: Licensed Clinical Social Worker

## 2009-05-26 ENCOUNTER — Encounter: Payer: Self-pay | Admitting: Infectious Disease

## 2009-05-27 ENCOUNTER — Ambulatory Visit: Payer: Self-pay | Admitting: Pulmonary Disease

## 2009-06-01 ENCOUNTER — Ambulatory Visit (HOSPITAL_COMMUNITY): Admission: RE | Admit: 2009-06-01 | Discharge: 2009-06-01 | Payer: Self-pay | Admitting: Infectious Disease

## 2009-06-01 IMAGING — PT NM PET TUM IMG INITIAL (PI) SKULL BASE T - THIGH
1 of 6 series · 1 of 25 positions shown · non-contrast
Comparison: [DATE]

CLINICAL DATA: Initial treatment strategy for lung cancer.

NUCLEAR MEDICINE PET CT INITIAL (PI) SKULL BASE TO THIGH
TECHNIQUE: 15.9. mCi F-18 FDG was injected intravenously via the
right antecubital fossa.  Full-ring PET imaging was performed from
the skull base through the mid-thighs 60  minutes after injection.
CT data was obtained and used for attenuation correction and
anatomic localization only.  (This was not acquired as a diagnostic
CT examination.)
Fasting Blood Glucose:  83

[Series 1: pet ac · axial · 3.3mm · 4.69mm/px · 1 of 267 slices shown]
[im 134/267]
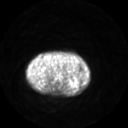

[1 of 25 positions shown; findings below may reference images not displayed]

FINDINGS: There are no hypermetabolic lymph nodes within the soft
tissues of the neck.

There is no hypermetabolic supraclavicular or axillary lymph nodes.

Within the right upper lobe there is a subpleural nodule which
measures 1.4 cm, image 51.  This has an SUV max equal to 7.4.

Within the superior segment of the right lower lobe there is a
pulmonary nodule which measures 8 mm.  This has an SUV max equal to
1.3.

There is a subpleural nodule within the paramediastinal right lower
lobe.  This is directly posterior to the left atrium.  This
measures 1.2 cm and has an SUV max equal to 8.4.

There is abnormal FDG uptake within the right hilar region.  This
has an SUV max equal to 3.7.  Within the anterior mediastinum there
is a lymph node/mass which measures 1.1 cm and has an SUV max equal
to 11.0.

There is no abnormal FDG uptake identified within the liver or
spleen.

The spleen is negative.

The adrenal glands are negative.

The pancreas appears normal.

There is no abnormal FDG uptake within the retroperitoneal or small
bowel mesenteric region.

No hypermetabolic inguinal or pelvic lymph nodes.

Review of the visualized osseous structures shows no specific
features to suggest bone metastases.
IMPRESSION: 1.  There are three foci of abnormal FDG uptake within the right
lung which are worrisome for pulmonary malignancy.
2.  Hypermetabolic right hilar and anterior mediastinal lymph node
metastasis.
3.  No evidence for metastatic disease within the abdomen or
pelvis.

## 2009-06-02 ENCOUNTER — Telehealth: Payer: Self-pay | Admitting: Pulmonary Disease

## 2009-06-03 ENCOUNTER — Telehealth: Payer: Self-pay | Admitting: Pulmonary Disease

## 2009-06-04 ENCOUNTER — Telehealth: Payer: Self-pay | Admitting: Pulmonary Disease

## 2009-06-08 ENCOUNTER — Telehealth: Payer: Self-pay | Admitting: Pulmonary Disease

## 2009-06-09 ENCOUNTER — Encounter: Payer: Self-pay | Admitting: Infectious Disease

## 2009-06-09 ENCOUNTER — Encounter: Payer: Self-pay | Admitting: Pulmonary Disease

## 2009-06-14 ENCOUNTER — Ambulatory Visit: Payer: Self-pay | Admitting: Pulmonary Disease

## 2009-06-15 ENCOUNTER — Ambulatory Visit: Payer: Self-pay | Admitting: Infectious Disease

## 2009-06-15 LAB — CONVERTED CEMR LAB
ALT: 32 units/L (ref 0–53)
AST: 42 units/L — ABNORMAL HIGH (ref 0–37)
Albumin: 4 g/dL (ref 3.5–5.2)
Alkaline Phosphatase: 77 units/L (ref 39–117)
BUN: 11 mg/dL (ref 6–23)
Basophils Absolute: 0.1 10*3/uL (ref 0.0–0.1)
Basophils Relative: 1 % (ref 0–1)
CO2: 22 meq/L (ref 19–32)
Calcium: 9.7 mg/dL (ref 8.4–10.5)
Chloride: 104 meq/L (ref 96–112)
Creatinine, Ser: 0.99 mg/dL (ref 0.40–1.50)
Eosinophils Absolute: 0.2 10*3/uL (ref 0.0–0.7)
Eosinophils Relative: 3 % (ref 0–5)
Glucose, Bld: 88 mg/dL (ref 70–99)
HCT: 42.2 % (ref 39.0–52.0)
HIV 1 RNA Quant: 292 copies/mL — ABNORMAL HIGH (ref <48)
HIV-1 RNA Quant, Log: 2.47 — ABNORMAL HIGH (ref <1.68)
Hemoglobin: 13.6 g/dL (ref 13.0–17.0)
Lymphocytes Relative: 24 % (ref 12–46)
Lymphs Abs: 1.5 10*3/uL (ref 0.7–4.0)
MCHC: 32.2 g/dL (ref 30.0–36.0)
MCV: 90.4 fL (ref >=78.0)
Monocytes Absolute: 0.7 10*3/uL (ref 0.1–1.0)
Monocytes Relative: 12 % (ref 3–12)
Neutro Abs: 3.8 10*3/uL (ref 1.7–7.7)
Neutrophils Relative %: 61 % (ref 43–77)
Platelets: 365 10*3/uL (ref 150–400)
Potassium: 4.5 meq/L (ref 3.5–5.3)
RBC: 4.67 M/uL (ref 4.22–5.81)
RDW: 15.3 % (ref 11.5–15.5)
Sodium: 137 meq/L (ref 135–145)
Total Bilirubin: 0.6 mg/dL (ref 0.3–1.2)
Total Protein: 8 g/dL (ref 6.0–8.3)
WBC: 6.2 10*3/uL (ref 4.0–10.5)

## 2009-06-21 ENCOUNTER — Encounter (INDEPENDENT_AMBULATORY_CARE_PROVIDER_SITE_OTHER): Payer: Self-pay | Admitting: Interventional Radiology

## 2009-06-21 ENCOUNTER — Encounter: Payer: Self-pay | Admitting: Pulmonary Disease

## 2009-06-21 ENCOUNTER — Ambulatory Visit (HOSPITAL_COMMUNITY): Admission: RE | Admit: 2009-06-21 | Discharge: 2009-06-21 | Payer: Self-pay | Admitting: Pulmonary Disease

## 2009-06-21 IMAGING — CT CT BIOPSY
1 series · 15 of 33 positions shown, 19 images · non-contrast
Comparison: none

CLINICAL DATA: Right upper lobe parasternal lung nodule, PET
positive activity worsened for malignancy.  History of HIV.

[Series 2: routine chest · axial · 0.77mm/px · z∈[-119,-9]mm · 15 of 96 slices shown, 19 images]
[im 8/96  mediastinal]
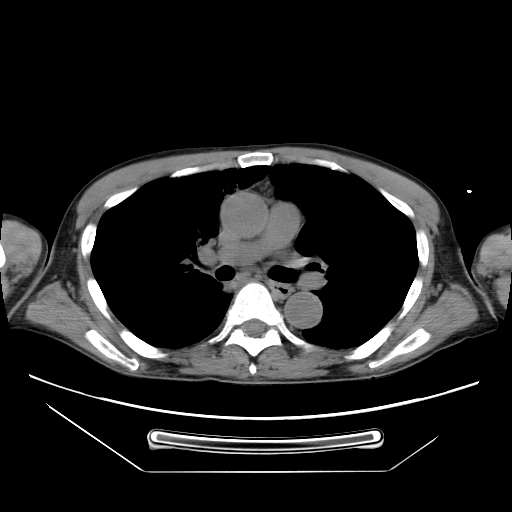
[im 8/96  lung]
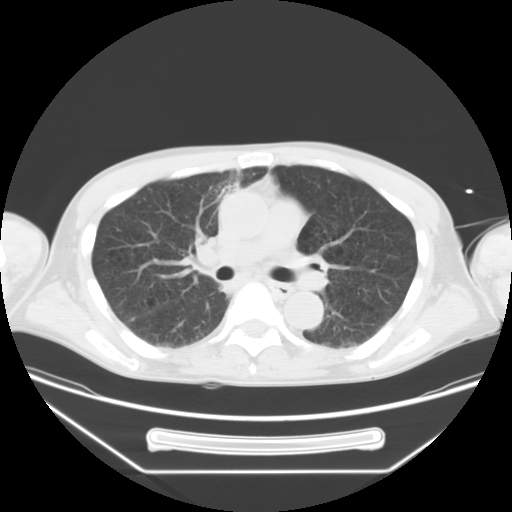
[im 15/96  lung]
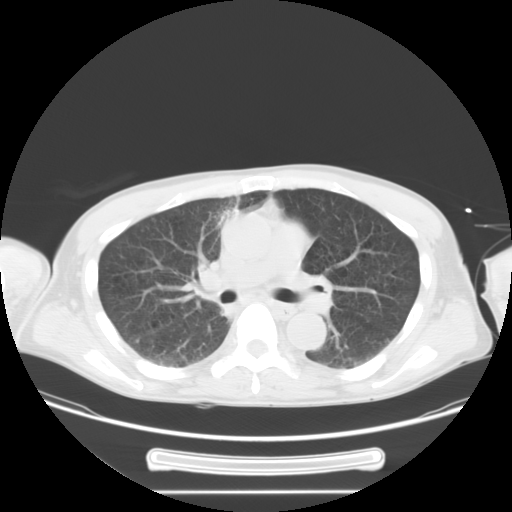
[im 20/96  lung]
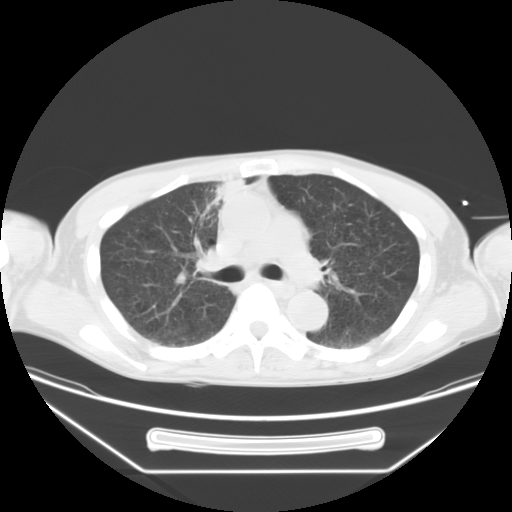
[im 25/96  lung]
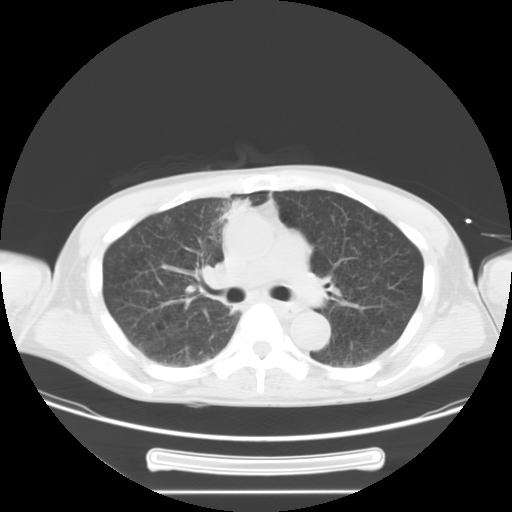
[im 32/96  mediastinal]
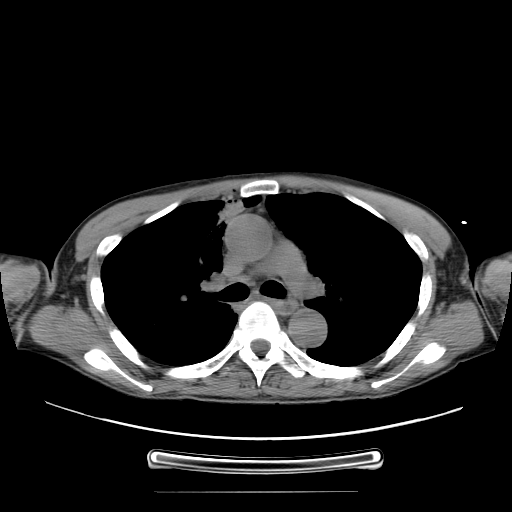
[im 32/96  lung]
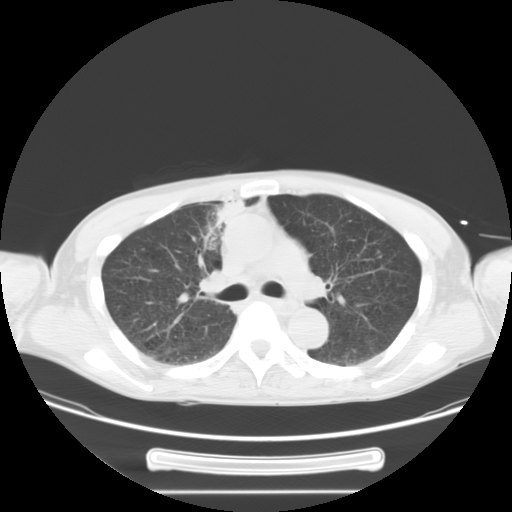
[im 39/96  lung]
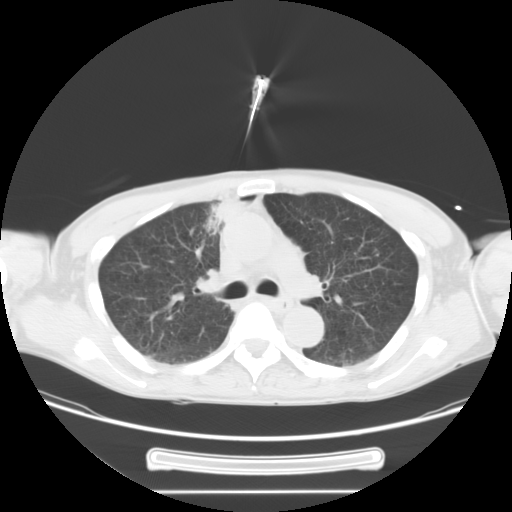
[im 46/96  lung]
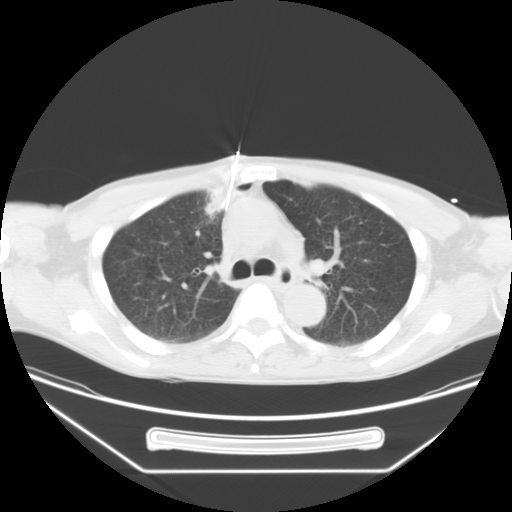
[im 53/96  lung]
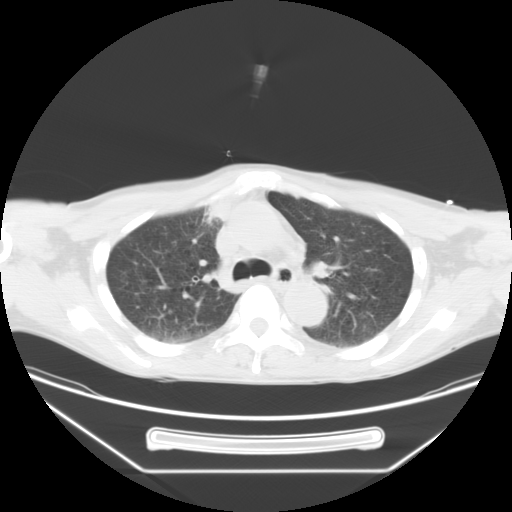
[im 58/96  mediastinal]
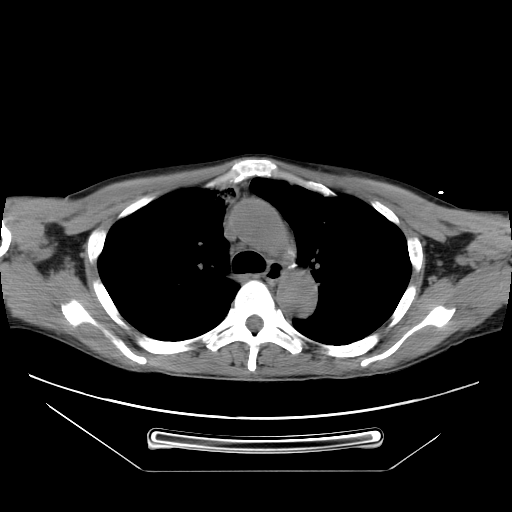
[im 58/96  lung]
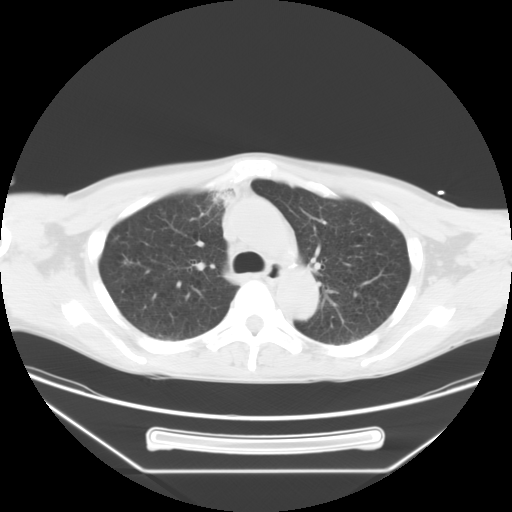
[im 64/96  lung]
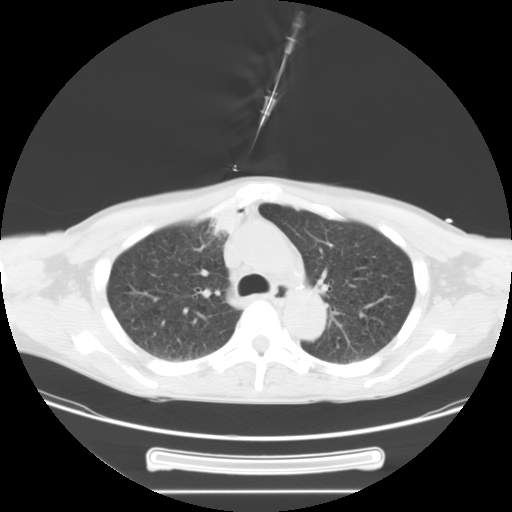
[im 71/96  lung]
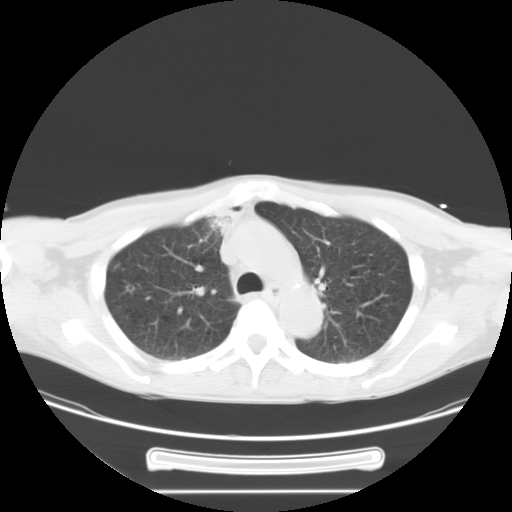
[im 76/96  lung]
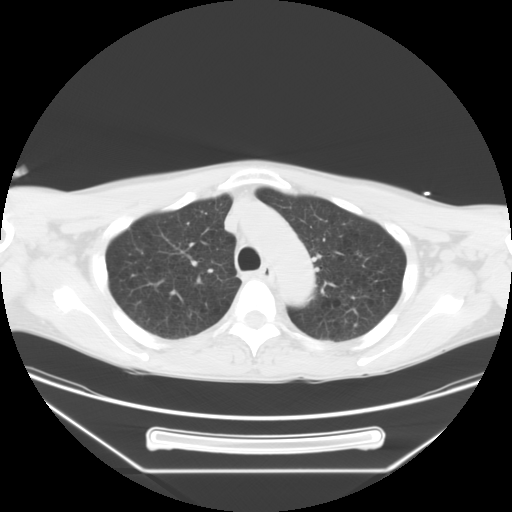
[im 78/96  mediastinal]
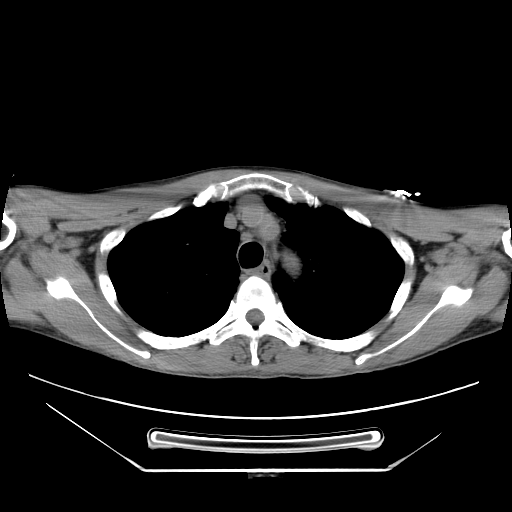
[im 78/96  lung]
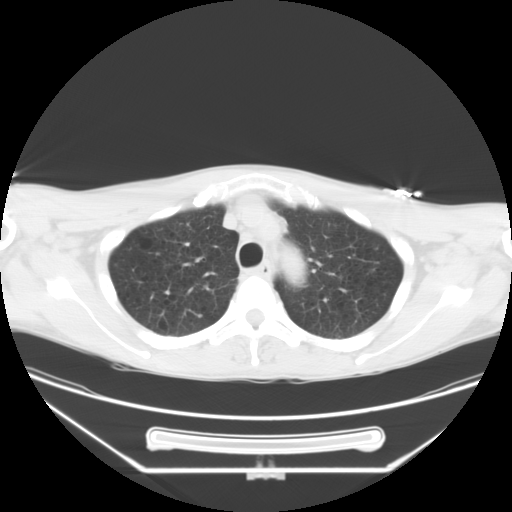
[im 81/96  lung]
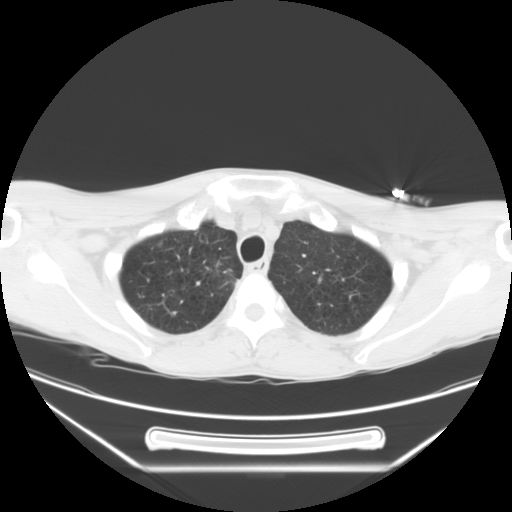
[im 88/96  lung]
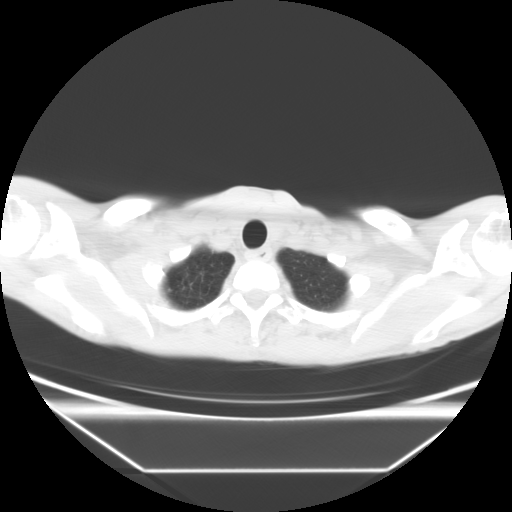

[15 of 33 positions shown; findings below may reference images not displayed]

CT RIGHT UPPER LOBE PARASTERNAL NODULE FNA BIOPSY AND CULTURE.

Date:  [DATE] [DATE]

Radiologist:  MOATSHE, M.D.

Medications:  4 mg Versed, 200 mcg Fentanyl

Guidance:  CT

Fluoroscopy time:  None.

Sedation time:  30 minutes

Contrast volume:  None.

Complications:  No immediate

PROCEDURE/FINDINGS:

Informed consent was obtained from the patient following
explanation of the procedure, risks, benefits and alternatives.
The patient understands, agrees and consents for the procedure.
All questions were addressed.  A time out was performed.

Maximal barrier sterile technique utilized including caps, mask,
sterile gowns, sterile gloves, large sterile drape, hand hygiene,
and betadine

Previous PET CT was reviewed.  The right upper lobe anterior
parasternal nodular abnormality was localized.  The patient was
positioned supine.  Noncontrast localization CT was performed.
Under sterile conditions and local anesthesia, a 17 gauge 6.8 cm
access needle was advanced from a right anterior parasternal
approach.  Needle position was confirmed with CT.  Through the
access, a 20 gauge MOATSHE needle was advanced into the nodule.
Needle position was reconfirmed by CT.  FNA biopsy was performed
three times.  Third sample sent for Gram stain, aerobe, anaerobe,
AFB and fungal cultures.  Final pathology and cultures pending.  No
immediate complication.  The patient tolerated procedure well.
Needles were removed.  Post procedure imaging demonstrates a small
adjacent pneumothorax.  No pleural fluid.
IMPRESSION: CT guided right upper lobe parasternal nodule FNA biopsy and
culture.

## 2009-06-21 IMAGING — CR DG CHEST 1V PORT
1 series · 1 of 1 positions shown · non-contrast
Comparison: [DATE]

CLINICAL DATA: Status post parasternal lung nodule biopsy

PORTABLE CHEST - 1 VIEW

[view not recorded]
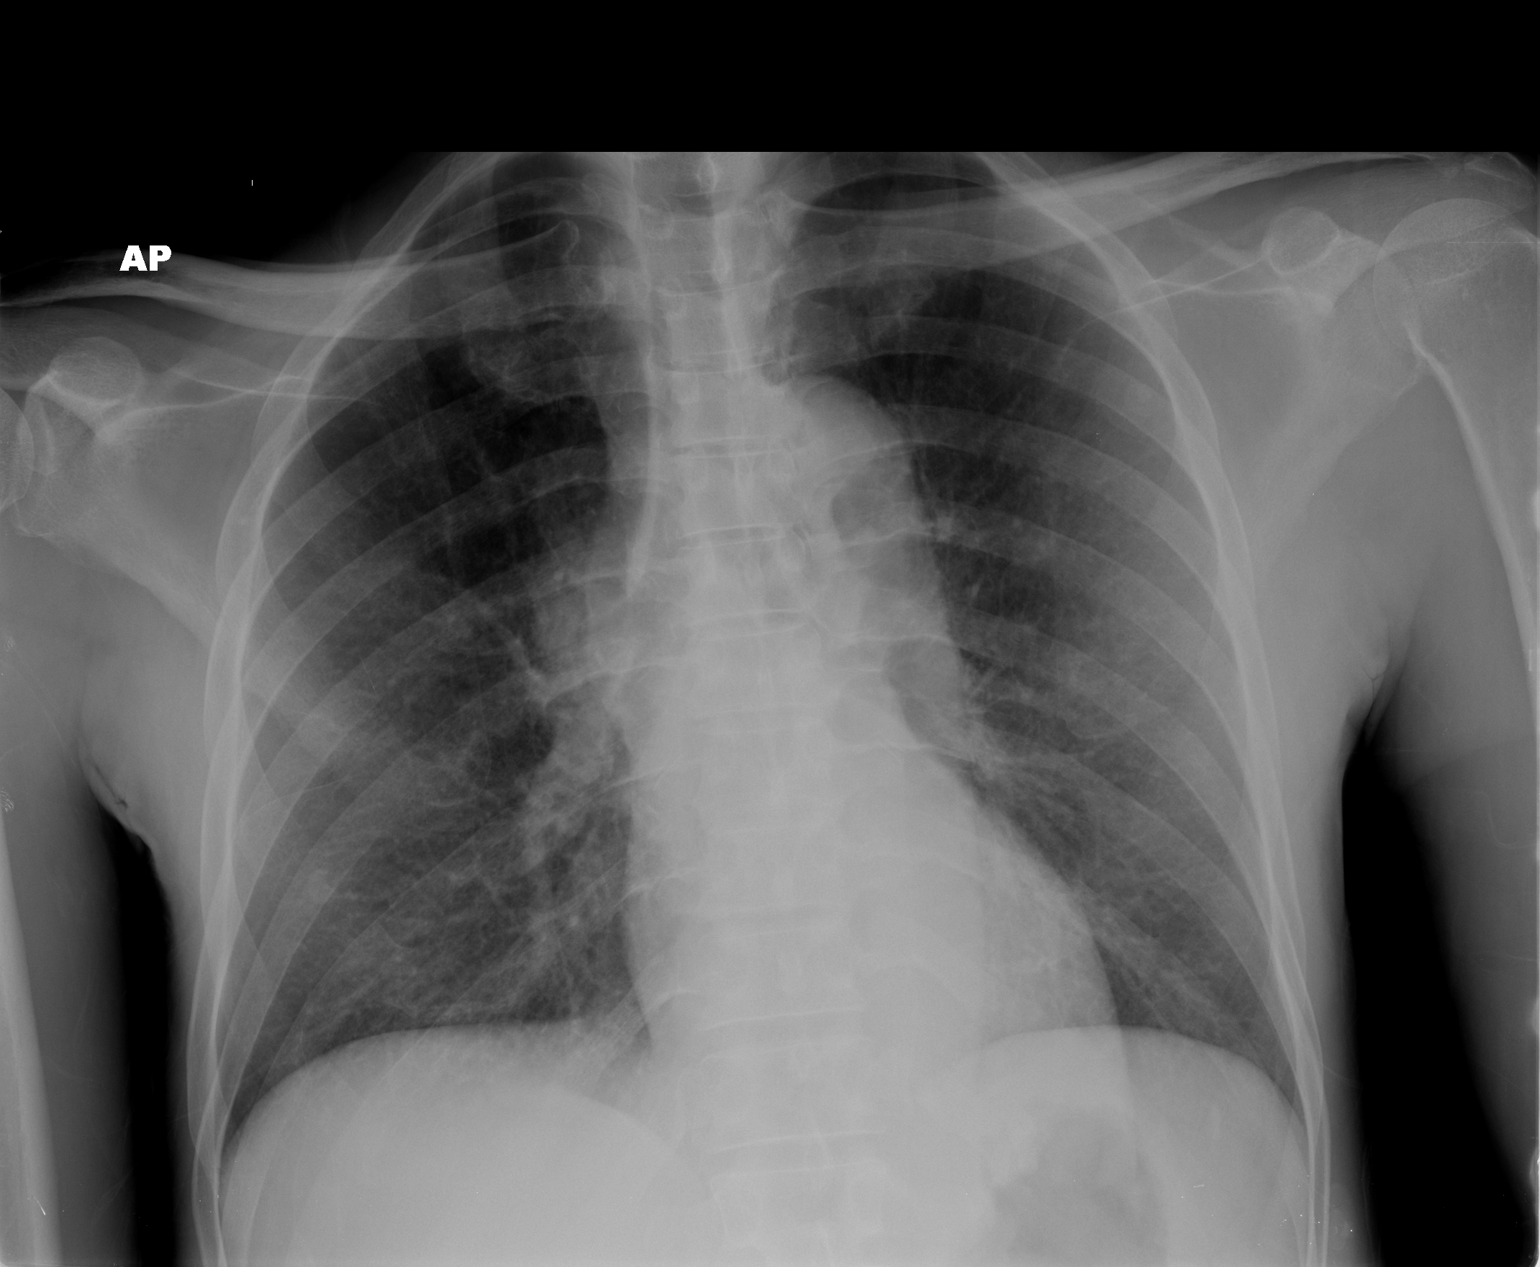

[1 of 1 positions shown; findings below may reference images not displayed]

FINDINGS: Cardiomediastinal silhouette is stable.  No acute
infiltrate or pleural effusion.  No pulmonary edema.  No
pneumothorax.
IMPRESSION: No active disease.  No pneumothorax.

## 2009-06-23 ENCOUNTER — Telehealth: Payer: Self-pay | Admitting: Pulmonary Disease

## 2009-06-28 ENCOUNTER — Ambulatory Visit: Payer: Self-pay | Admitting: Infectious Disease

## 2009-06-28 DIAGNOSIS — A4902 Methicillin resistant Staphylococcus aureus infection, unspecified site: Secondary | ICD-10-CM | POA: Insufficient documentation

## 2009-06-28 DIAGNOSIS — F0630 Mood disorder due to known physiological condition, unspecified: Secondary | ICD-10-CM | POA: Insufficient documentation

## 2009-06-28 DIAGNOSIS — J984 Other disorders of lung: Secondary | ICD-10-CM | POA: Insufficient documentation

## 2009-06-28 LAB — CONVERTED CEMR LAB: Testosterone: 233.95 ng/dL — ABNORMAL LOW (ref 350–890)

## 2009-07-05 ENCOUNTER — Ambulatory Visit: Payer: Self-pay | Admitting: Pulmonary Disease

## 2009-07-14 ENCOUNTER — Encounter: Payer: Self-pay | Admitting: Infectious Disease

## 2009-07-15 ENCOUNTER — Encounter: Payer: Self-pay | Admitting: Infectious Disease

## 2009-08-07 ENCOUNTER — Ambulatory Visit: Payer: Self-pay | Admitting: Infectious Disease

## 2009-09-09 ENCOUNTER — Ambulatory Visit: Payer: Self-pay | Admitting: Infectious Disease

## 2009-09-09 DIAGNOSIS — L84 Corns and callosities: Secondary | ICD-10-CM | POA: Insufficient documentation

## 2009-09-09 LAB — CONVERTED CEMR LAB
ALT: 35 units/L (ref 0–53)
AST: 44 units/L — ABNORMAL HIGH (ref 0–37)
Albumin: 4.2 g/dL (ref 3.5–5.2)
Alkaline Phosphatase: 123 units/L — ABNORMAL HIGH (ref 39–117)
BUN: 11 mg/dL (ref 6–23)
Basophils Absolute: 0 10*3/uL (ref 0.0–0.1)
Basophils Relative: 1 % (ref 0–1)
CO2: 24 meq/L (ref 19–32)
Calcium: 9.3 mg/dL (ref 8.4–10.5)
Chlamydia, Swab/Urine, PCR: NEGATIVE
Chloride: 105 meq/L (ref 96–112)
Creatinine, Ser: 1.03 mg/dL (ref 0.40–1.50)
Eosinophils Absolute: 0.2 10*3/uL (ref 0.0–0.7)
Eosinophils Relative: 3 % (ref 0–5)
GC Probe Amp, Urine: NEGATIVE
Glucose, Bld: 74 mg/dL (ref 70–99)
HCT: 42.1 % (ref 39.0–52.0)
HIV 1 RNA Quant: <48 copies/mL (ref <48)
HIV-1 RNA Quant, Log: <1.68 (ref <1.68)
Hemoglobin: 13.9 g/dL (ref 13.0–17.0)
Lymphocytes Relative: 29 % (ref 12–46)
Lymphs Abs: 1.3 10*3/uL (ref 0.7–4.0)
MCHC: 33 g/dL (ref 30.0–36.0)
MCV: 90.3 fL (ref >=78.0)
Monocytes Absolute: 0.6 10*3/uL (ref 0.1–1.0)
Monocytes Relative: 13 % — ABNORMAL HIGH (ref 3–12)
Neutro Abs: 2.4 10*3/uL (ref 1.7–7.7)
Neutrophils Relative %: 54 % (ref 43–77)
Platelets: 297 10*3/uL (ref 150–400)
Potassium: 4.2 meq/L (ref 3.5–5.3)
RBC: 4.66 M/uL (ref 4.22–5.81)
RDW: 13.4 % (ref 11.5–15.5)
Sodium: 139 meq/L (ref 135–145)
Total Bilirubin: 0.3 mg/dL (ref 0.3–1.2)
Total Protein: 8.9 g/dL — ABNORMAL HIGH (ref 6.0–8.3)
WBC: 4.4 10*3/uL (ref 4.0–10.5)

## 2009-09-16 ENCOUNTER — Telehealth (INDEPENDENT_AMBULATORY_CARE_PROVIDER_SITE_OTHER): Payer: Self-pay | Admitting: *Deleted

## 2009-09-28 ENCOUNTER — Encounter: Payer: Self-pay | Admitting: Infectious Disease

## 2009-09-29 ENCOUNTER — Telehealth: Payer: Self-pay | Admitting: Infectious Disease

## 2009-09-30 ENCOUNTER — Ambulatory Visit: Payer: Self-pay | Admitting: Infectious Disease

## 2009-09-30 LAB — CONVERTED CEMR LAB
HIV 1 RNA Quant: <48 copies/mL (ref <48)
HIV-1 RNA Quant, Log: <1.68 (ref <1.68)

## 2009-10-20 ENCOUNTER — Encounter: Payer: Self-pay | Admitting: Infectious Disease

## 2009-10-20 ENCOUNTER — Telehealth: Payer: Self-pay | Admitting: Infectious Disease

## 2009-12-06 ENCOUNTER — Ambulatory Visit: Payer: Self-pay | Admitting: Infectious Disease

## 2009-12-06 LAB — CONVERTED CEMR LAB
ALT: 31 units/L (ref 0–53)
AST: 52 units/L — ABNORMAL HIGH (ref 0–37)
Albumin: 4 g/dL (ref 3.5–5.2)
Alkaline Phosphatase: 104 units/L (ref 39–117)
BUN: 13 mg/dL (ref 6–23)
Basophils Absolute: 0 10*3/uL (ref 0.0–0.1)
Basophils Relative: 1 % (ref 0–1)
CO2: 22 meq/L (ref 19–32)
Calcium: 9.4 mg/dL (ref 8.4–10.5)
Chloride: 101 meq/L (ref 96–112)
Cholesterol: 102 mg/dL (ref 0–200)
Creatinine, Ser: 1.01 mg/dL (ref 0.40–1.50)
Eosinophils Absolute: 0.2 10*3/uL (ref 0.0–0.7)
Eosinophils Relative: 3 % (ref 0–5)
Glucose, Bld: 94 mg/dL (ref 70–99)
HCT: 45.5 % (ref 39.0–52.0)
HDL: 42 mg/dL (ref >39)
HIV 1 RNA Quant: <48 copies/mL (ref <48)
HIV-1 RNA Quant, Log: <1.68 (ref <1.68)
Hemoglobin: 14.5 g/dL (ref 13.0–17.0)
LDL Cholesterol: 22 mg/dL (ref 0–99)
Lymphocytes Relative: 27 % (ref 12–46)
Lymphs Abs: 1.7 10*3/uL (ref 0.7–4.0)
MCHC: 31.9 g/dL (ref 30.0–36.0)
MCV: 95.4 fL (ref 78.0–100.0)
Monocytes Absolute: 1 10*3/uL (ref 0.1–1.0)
Monocytes Relative: 16 % — ABNORMAL HIGH (ref 3–12)
Neutro Abs: 3.3 10*3/uL (ref 1.7–7.7)
Neutrophils Relative %: 53 % (ref 43–77)
Platelets: 303 10*3/uL (ref 150–400)
Potassium: 4.3 meq/L (ref 3.5–5.3)
RBC: 4.77 M/uL (ref 4.22–5.81)
RDW: 13.6 % (ref 11.5–15.5)
Sodium: 135 meq/L (ref 135–145)
Total Bilirubin: 0.5 mg/dL (ref 0.3–1.2)
Total CHOL/HDL Ratio: 2.4
Total Protein: 8.1 g/dL (ref 6.0–8.3)
Triglycerides: 191 mg/dL — ABNORMAL HIGH (ref <150)
VLDL: 38 mg/dL (ref 0–40)
WBC: 6.1 10*3/uL (ref 4.0–10.5)

## 2009-12-13 ENCOUNTER — Ambulatory Visit: Payer: Self-pay | Admitting: Infectious Disease

## 2009-12-13 DIAGNOSIS — I10 Essential (primary) hypertension: Secondary | ICD-10-CM | POA: Insufficient documentation

## 2009-12-13 DIAGNOSIS — R252 Cramp and spasm: Secondary | ICD-10-CM | POA: Insufficient documentation

## 2010-01-13 ENCOUNTER — Ambulatory Visit: Payer: Self-pay | Admitting: Infectious Disease

## 2010-01-13 LAB — CONVERTED CEMR LAB
ALT: 34 U/L (ref 0–53)
AST: 44 U/L — ABNORMAL HIGH (ref 0–37)
Albumin: 4.2 g/dL (ref 3.5–5.2)
Alkaline Phosphatase: 98 U/L (ref 39–117)
BUN: 8 mg/dL (ref 6–23)
Basophils Absolute: 0 K/uL (ref 0.0–0.1)
Basophils Relative: 1 % (ref 0–1)
CO2: 18 meq/L — ABNORMAL LOW (ref 19–32)
Calcium: 9.5 mg/dL (ref 8.4–10.5)
Chloride: 106 meq/L (ref 96–112)
Creatinine, Ser: 1.08 mg/dL (ref 0.40–1.50)
Eosinophils Absolute: 0.2 K/uL (ref 0.0–0.7)
Eosinophils Relative: 4 % (ref 0–5)
Glucose, Bld: 90 mg/dL (ref 70–99)
HCT: 44.2 % (ref 39.0–52.0)
HIV 1 RNA Quant: <48 copies/mL (ref <48)
HIV-1 RNA Quant, Log: <1.68 (ref <1.68)
Hemoglobin: 14.5 g/dL (ref 13.0–17.0)
Lymphocytes Relative: 49 % — ABNORMAL HIGH (ref 12–46)
Lymphs Abs: 2.5 K/uL (ref 0.7–4.0)
MCHC: 32.8 g/dL (ref 30.0–36.0)
MCV: 93.2 fL (ref 78.0–100.0)
Monocytes Absolute: 0.6 K/uL (ref 0.1–1.0)
Monocytes Relative: 12 % (ref 3–12)
Neutro Abs: 1.7 K/uL (ref 1.7–7.7)
Neutrophils Relative %: 34 % — ABNORMAL LOW (ref 43–77)
Platelets: 318 K/uL (ref 150–400)
Potassium: 4.2 meq/L (ref 3.5–5.3)
RBC: 4.74 M/uL (ref 4.22–5.81)
RDW: 13.5 % (ref 11.5–15.5)
Sodium: 138 meq/L (ref 135–145)
Total Bilirubin: 0.5 mg/dL (ref 0.3–1.2)
Total Protein: 8.4 g/dL — ABNORMAL HIGH (ref 6.0–8.3)
WBC: 5.1 10*3/microliter (ref 4.0–10.5)

## 2010-01-31 ENCOUNTER — Encounter: Payer: Self-pay | Admitting: Infectious Disease

## 2010-02-17 ENCOUNTER — Telehealth (INDEPENDENT_AMBULATORY_CARE_PROVIDER_SITE_OTHER): Payer: Self-pay | Admitting: *Deleted

## 2010-02-18 ENCOUNTER — Encounter (INDEPENDENT_AMBULATORY_CARE_PROVIDER_SITE_OTHER): Payer: Self-pay | Admitting: *Deleted

## 2010-02-21 ENCOUNTER — Encounter: Payer: Self-pay | Admitting: Infectious Disease

## 2010-02-21 ENCOUNTER — Ambulatory Visit: Payer: Self-pay | Admitting: Internal Medicine

## 2010-02-21 DIAGNOSIS — R109 Unspecified abdominal pain: Secondary | ICD-10-CM | POA: Insufficient documentation

## 2010-02-21 LAB — CONVERTED CEMR LAB
Bacteria, UA: NONE SEEN
Blood in Urine, dipstick: NEGATIVE
Crystals: NONE SEEN
Glucose, Urine, Semiquant: NEGATIVE
Hemoglobin, Urine: NEGATIVE
Ketones, ur: NEGATIVE mg/dL
Ketones, urine, test strip: NEGATIVE
Leukocytes, UA: NEGATIVE
Nitrite: NEGATIVE
Nitrite: NEGATIVE
Protein, U semiquant: 100
Protein, ur: 30 mg/dL — AB
RBC / HPF: NONE SEEN (ref <3)
Specific Gravity, Urine: >=1.03
Specific Gravity, Urine: 1.026 (ref 1.005–1.030)
Squamous Epithelial / LPF: NONE SEEN /lpf
Urine Glucose: NEGATIVE mg/dL
Urobilinogen, UA: 0.2
Urobilinogen, UA: 0.2 (ref 0.0–1.0)
WBC Urine, dipstick: NEGATIVE
WBC, UA: NONE SEEN cells/hpf (ref <3)
pH: 5.5
pH: 5.5 (ref 5.0–8.0)

## 2010-02-23 ENCOUNTER — Encounter: Payer: Self-pay | Admitting: Infectious Disease

## 2010-02-28 ENCOUNTER — Ambulatory Visit (HOSPITAL_COMMUNITY): Admission: RE | Admit: 2010-02-28 | Discharge: 2010-02-28 | Payer: Self-pay | Admitting: Internal Medicine

## 2010-02-28 IMAGING — CT CT ABDOMEN WO/W CM
2 of 9 series · 14 of 46 positions shown, 19 images · IV contrast (APPLIED)
Comparison: [HOSPITAL] acute abdominal radiographs
[DATE] and [DATE].

CLINICAL DATA: Left flank pain 3 weeks. Productive cough,
hypertension

CT ABDOMEN WITHOUT AND WITH CONTRAST
TECHNIQUE: Multidetector CT imaging of the abdomen was performed
following the standard protocol before and during bolus
administration of intravenous contrast.
Contrast: Pre and post intravenous 100 ml [S5].

[Series 2: renal w/o 2.0 b31f st · axial · non-contrast · 0.65mm/px · z∈[-308,-101]mm · 12 of 233 slices shown, 16 images]
[im 13/233  soft-tissue]
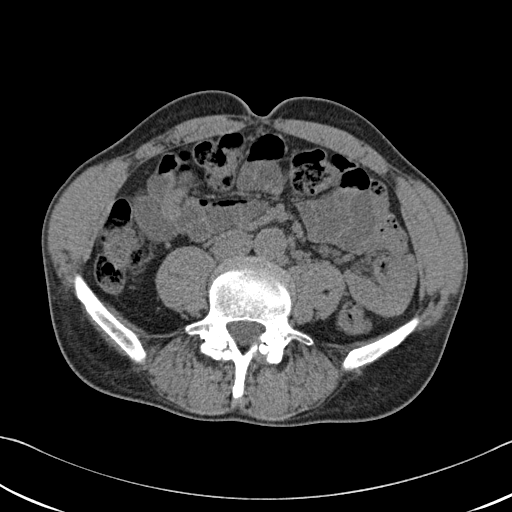
[im 13/233  bone]
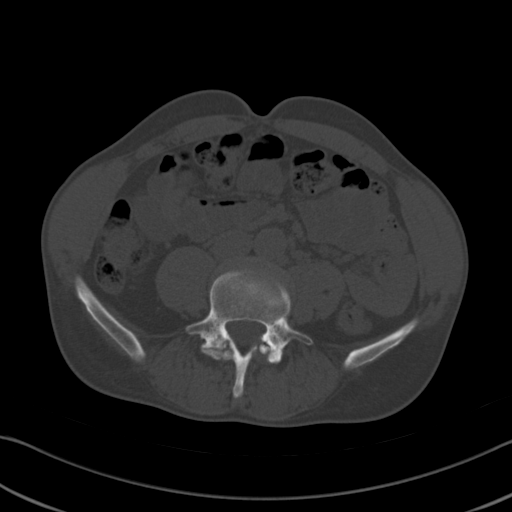
[im 37/233  soft-tissue]
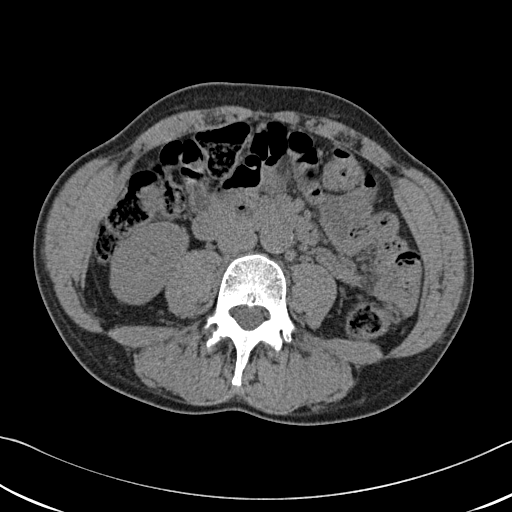
[im 62/233  soft-tissue]
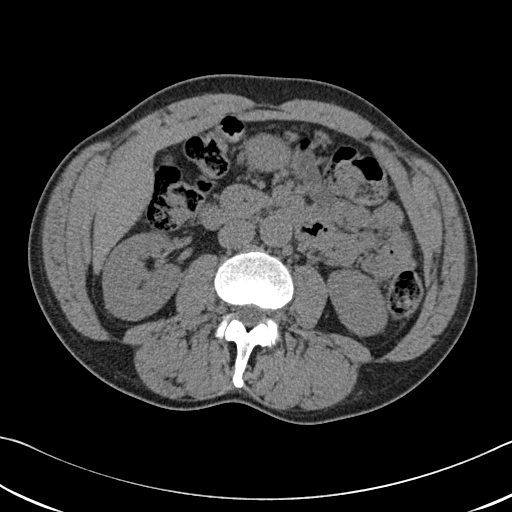
[im 86/233  soft-tissue]
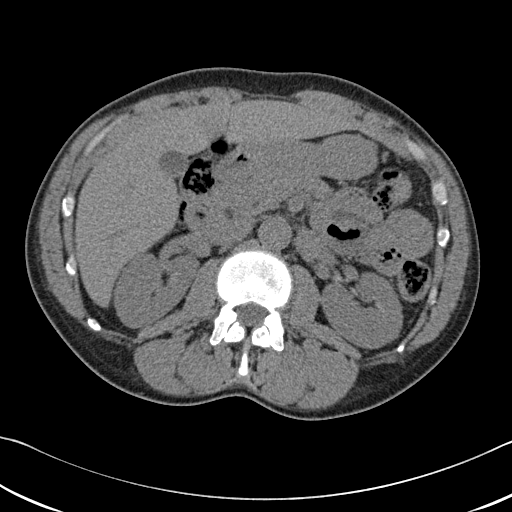
[im 110/233  soft-tissue]
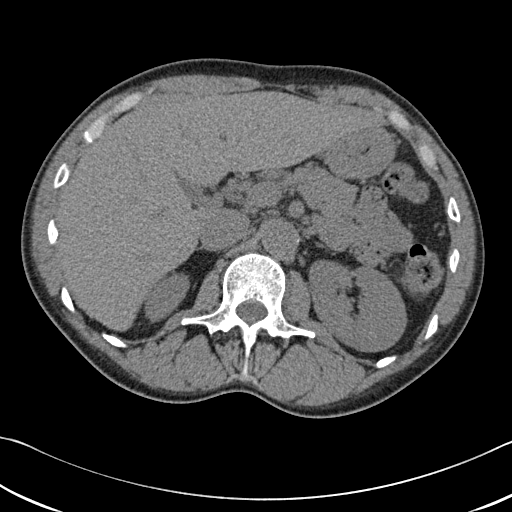
[im 123/233  soft-tissue]
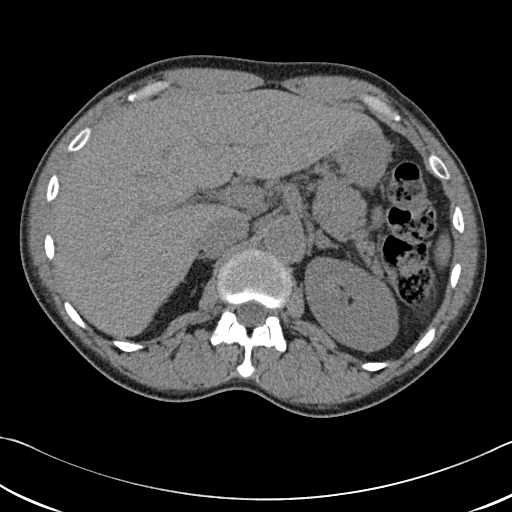
[im 147/233  soft-tissue]
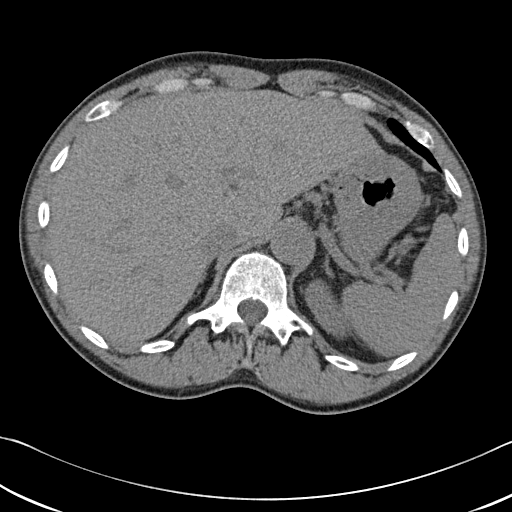
[im 171/233  soft-tissue]
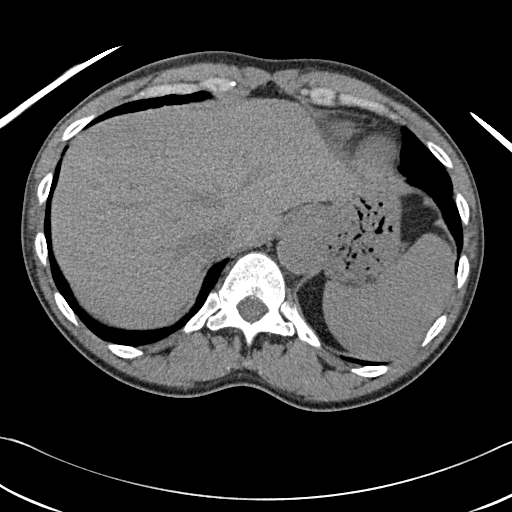
[im 184/233  lung]
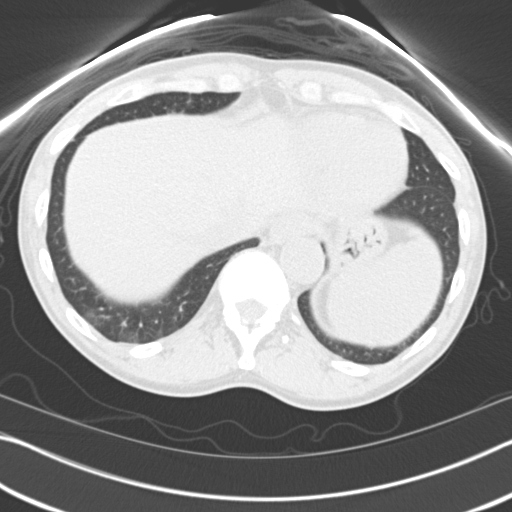
[im 196/233  soft-tissue]
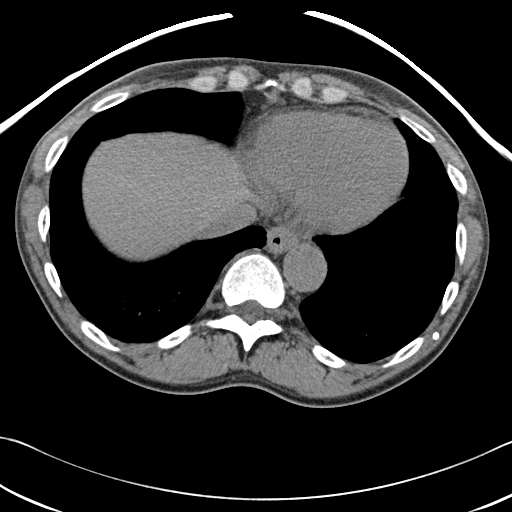
[im 196/233  lung]
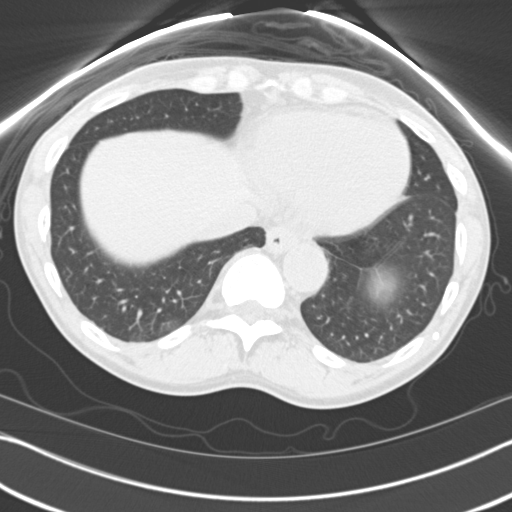
[im 196/233  bone]
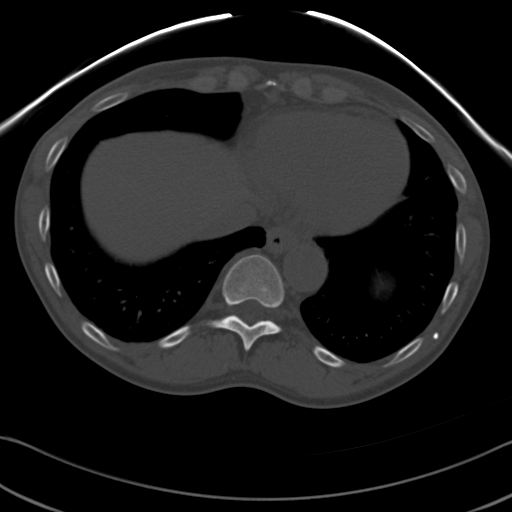
[im 208/233  lung]
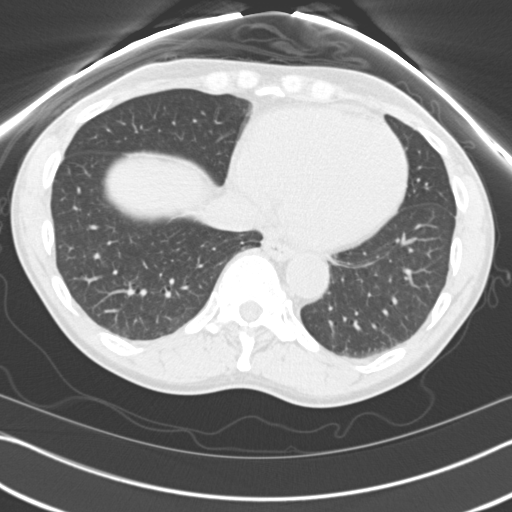
[im 220/233  soft-tissue]
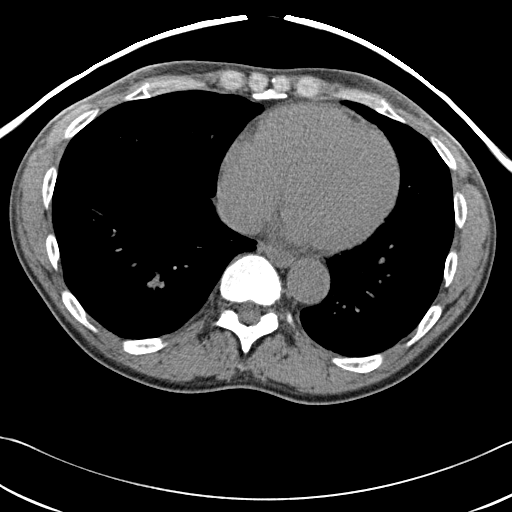
[im 220/233  lung]
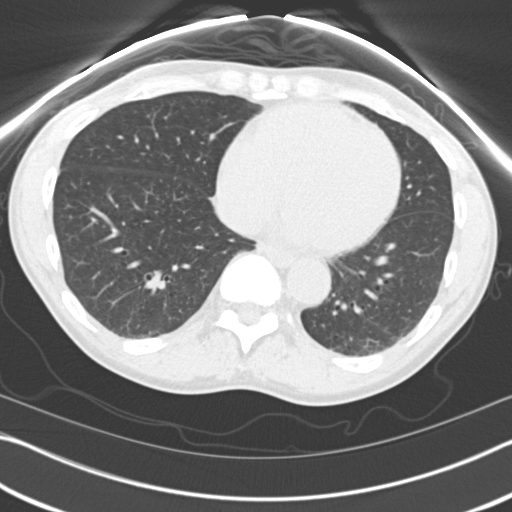

[Series 602: w/o coronals · coronal · non-contrast · 0.65mm/px · 2 of 105 slices shown, 3 images]
[im 35/105  soft-tissue]
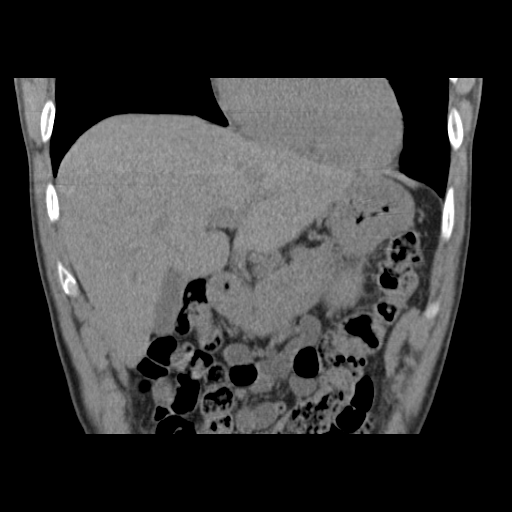
[im 35/105  bone]
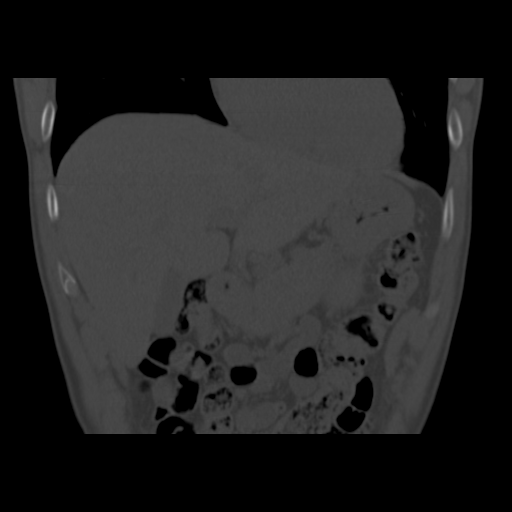
[im 70/105  soft-tissue]
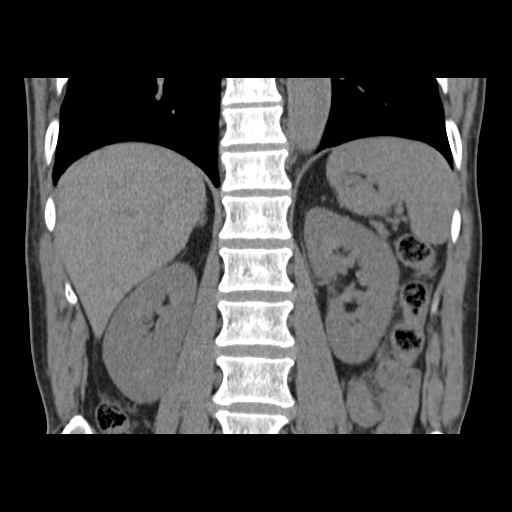

[14 of 46 positions shown; findings below may reference images not displayed]

FINDINGS: Slight amorphous tiny hyperdensities seen at upper to mid
right and throughout left kidney medullary region of consistent
with tiny nonobstructing renal calculi or medullary sponge kidney.
Tiny 4 mm anterolateral lower pole left renal cyst seen.  Upper
urinary tracts otherwise normal appearing.  Bases of the lungs are
clear.  Remaining abdominal organs appear normal without
inflammation, free fluid or adenopathy.  Left flank musculature
appears normal without abdominal hernia.  (Right greater than left)
moderately severe L4-5 facet degenerative joint disease noted.  No
other significant osseous lesions seen.
IMPRESSION: 1.  Slight bilateral amorphous tiny hyperdensities at the medullary
portion of the kidneys consistent with tiny nonobstructing renal
calculi and/or medullary sponge kidney.
2.  Tiny 4 mm anterolateral lower pole left renal cyst - otherwise
normal-appearing upper urinary tract.
3.  (Right greater left) moderately severe L4-5 facet degenerative
joint disease.
4.  Otherwise, normal.

## 2010-03-29 ENCOUNTER — Encounter: Payer: Self-pay | Admitting: Infectious Disease

## 2010-04-25 ENCOUNTER — Encounter: Payer: Self-pay | Admitting: Infectious Disease

## 2010-04-26 ENCOUNTER — Encounter: Payer: Self-pay | Admitting: Infectious Disease

## 2010-05-16 ENCOUNTER — Ambulatory Visit: Payer: Self-pay | Admitting: Infectious Disease

## 2010-05-16 DIAGNOSIS — R109 Unspecified abdominal pain: Secondary | ICD-10-CM | POA: Insufficient documentation

## 2010-05-16 DIAGNOSIS — Z9189 Other specified personal risk factors, not elsewhere classified: Secondary | ICD-10-CM | POA: Insufficient documentation

## 2010-05-16 LAB — CONVERTED CEMR LAB
ALT: 27 units/L (ref 0–53)
AST: 50 units/L — ABNORMAL HIGH (ref 0–37)
Albumin: 4.2 g/dL (ref 3.5–5.2)
Alkaline Phosphatase: 92 units/L (ref 39–117)
BUN: 13 mg/dL (ref 6–23)
Basophils Absolute: 0.1 10*3/uL (ref 0.0–0.1)
Basophils Relative: 1 % (ref 0–1)
Blood in Urine, dipstick: NEGATIVE
CO2: 25 meq/L (ref 19–32)
Calcium: 9.6 mg/dL (ref 8.4–10.5)
Chloride: 103 meq/L (ref 96–112)
Creatinine, Ser: 1.02 mg/dL (ref 0.40–1.50)
Eosinophils Absolute: 0.3 10*3/uL (ref 0.0–0.7)
Eosinophils Relative: 5 % (ref 0–5)
Glucose, Bld: 85 mg/dL (ref 70–99)
Glucose, Urine, Semiquant: NEGATIVE
HCT: 44.6 % (ref 39.0–52.0)
HIV 1 RNA Quant: <20 copies/mL (ref <20)
HIV-1 RNA Quant, Log: <1.3 (ref <1.30)
Hemoglobin: 14.7 g/dL (ref 13.0–17.0)
Ketones, urine, test strip: NEGATIVE
Lymphocytes Relative: 32 % (ref 12–46)
Lymphs Abs: 1.8 10*3/uL (ref 0.7–4.0)
MCHC: 33 g/dL (ref 30.0–36.0)
MCV: 94.1 fL (ref 78.0–100.0)
Monocytes Absolute: 0.7 10*3/uL (ref 0.1–1.0)
Monocytes Relative: 13 % — ABNORMAL HIGH (ref 3–12)
Neutro Abs: 2.8 10*3/uL (ref 1.7–7.7)
Neutrophils Relative %: 50 % (ref 43–77)
Nitrite: NEGATIVE
Platelets: 318 10*3/uL (ref 150–400)
Potassium: 4.2 meq/L (ref 3.5–5.3)
Protein, U semiquant: 100
RBC: 4.74 M/uL (ref 4.22–5.81)
RDW: 13.6 % (ref 11.5–15.5)
Sodium: 139 meq/L (ref 135–145)
Specific Gravity, Urine: >=1.03
Total Bilirubin: 0.5 mg/dL (ref 0.3–1.2)
Total Protein: 8 g/dL (ref 6.0–8.3)
Urobilinogen, UA: 1
WBC Urine, dipstick: NEGATIVE
WBC: 5.7 10*3/uL (ref 4.0–10.5)
pH: 6

## 2010-06-06 ENCOUNTER — Ambulatory Visit: Payer: Self-pay | Admitting: Infectious Disease

## 2010-06-07 ENCOUNTER — Encounter: Payer: Self-pay | Admitting: Infectious Disease

## 2010-06-22 ENCOUNTER — Encounter: Payer: Self-pay | Admitting: Infectious Disease

## 2010-07-05 ENCOUNTER — Encounter (INDEPENDENT_AMBULATORY_CARE_PROVIDER_SITE_OTHER): Payer: Self-pay | Admitting: *Deleted

## 2010-08-27 ENCOUNTER — Encounter: Payer: Self-pay | Admitting: Infectious Disease

## 2010-08-28 ENCOUNTER — Encounter: Payer: Self-pay | Admitting: Infectious Disease

## 2010-09-06 NOTE — Miscellaneous (Signed)
Summary: [facility]   Imported By: Florinda Marker 09/14/2008 14:05:15  _____________________________________________________________________  External Attachment:    Type:   Image     Comment:   External Document

## 2010-09-06 NOTE — Letter (Signed)
Summary: [facility] : Old Visit Note  [facility] : Old Visit Note   Imported By: Florinda Marker 04/20/2008 14:23:01  _____________________________________________________________________  External Attachment:    Type:   Image     Comment:   External Document

## 2010-09-06 NOTE — Assessment & Plan Note (Signed)
Summary: F/U/PER DR Zenaida Niece [facility]/VS   CC:  dry cough and non productive.  History of Present Illness: 55 year old man with HIV, AIDS, hep C, cocaine abuse whom I met in February while he was being treated for crytpococcal meningitis. He was placed on prezista, norvir, truvad with dramatic fall in his VLoad and readmission for ssx consistent with meningitis--but with sterile CSF and picture c/w IRIS to crypto and started on prednisone. He has been to clinic intermittently since then.  He claims he has been "taing all of my medicines." HOwever he could not describe the names of any. In talking to him it appears that he has been without the "blue one"--truvada for sometime but appears to have been taking prezista and norvir. He states that the nagging cough and postnasal drip he had last month have never gone. HE denies fevers chills or facial pain. He is living in a house with a friend.  Preventive Screening-Counseling & Management  Alcohol-Tobacco     Alcohol drinks/day: 2     Alcohol type: beer     Smoking Status: current     Packs/Day: 0.75      Sexual History:  none.        Drug Use:  crack cocaine.        Blood Transfusions:  no.        Travel History:  no.     Current Allergies (reviewed today): No known allergies  Past History:  Family History: Last updated: 2008/10/20 Mother passed away in her 9's "internal bleeding" Father is in his 30s, healthy  Social History: Last updated: 12/17/2008 Lives with a friend in house. Drinks 2 beers on average One pack of cigarette lasts a week Past history of cocaine abuse Used to be a cook.   Risk Factors: Alcohol Use: 2 (01/18/2009) Caffeine Use: 3 (12/17/2008)  Risk Factors: Smoking Status: current (01/18/2009) Packs/Day: 0.75 (01/18/2009)  Past Medical History: Reviewed history from 12/17/2008 and no changes required. 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  The patient denies fever, vision loss, decreased hearing, chest pain, syncope, peripheral edema, headaches, hemoptysis, abdominal pain, melena, hematochezia, severe indigestion/heartburn, and hematuria.    Vital Signs:  Patient profile:   55 year old male Height:      57 inches (144.78 cm) Weight:      131 pounds (59.55 kg) BMI:     28.45 Temp:     98.9 degrees F (37.17 degrees C) oral Pulse rate:   87 / minute BP sitting:   119 / 85  (left arm)  Vitals Entered By: Starleen Arms CMA (January 18, 2009 10:49 AM) CC: dry cough, non productive Is Patient Diabetic? No Pain Assessment Patient in pain? no      Nutritional Status BMI of 25 - 29 = overweight Nutritional Status Detail off/on  Have you ever been in a relationship where you felt threatened, hurt or afraid?No   Does patient need assistance? Functional Status Self care Ambulation Normal   Physical Exam  General:  alert and cachetic.   Head:  wasting of facial muscles Eyes:  vision grossly intact, pupils equal, pupils round, and pupils reactive to light.   Ears:  no  external deformities.   Nose:  no external deformity.   Mouth:  no erythema and no exudates.  no thrush Lungs:  normal respiratory effort, no crackles, and no wheezes.   Heart:  normal rate, regular rhythm, no murmur, no gallop, and no rub.   Abdomen:  soft, non-tender, normal bowel sounds, no distention, no masses, and no guarding.   Msk:  normal ROM.   Neurologic:  alert & oriented X3.          Medication Adherence: 01/19/2009   Adherence to medications reviewed with patient. Counseling to provide adequate adherence provided    Prevention For Positives: 01/19/2009   Safe sex practices discussed with  patient. Condoms offered.                            Impression & Recommendations:  Problem # 1:  HIV DISEASE (ICD-042) Assessment Comment Only Hopefully mising truvada will have not resulted in significant PI resistance. Difficult to assess reliability but at least he is making it to [facility] two months in a row. His updated medication list for this problem includes:    Zithromax 600 Mg Tabs (Azithromycin) .Marland Kitchen... Take two tablets weekly    Fluconazole 200 Mg Tabs (Fluconazole) .Marland Kitchen... Take two pills  a day until 5/21st then one pill a day    Bactrim Ds 800-160 Mg Tabs (Sulfamethoxazole-trimethoprim) .Marland Kitchen... Take 1 tablet by mouth once a day    Augmentin 875-125 Mg Tabs (Amoxicillin-pot clavulanate) .Marland Kitchen... Take 1 tablet by mouth two times a day for 10 days  Orders: T-CBC w/Diff ([id number])  Problem # 2:  COUGH (ICD-786.2) I checked CXR--no pna. and will give him a coruse of augmentin His updated medication list for this problem includes:    Zithromax 600 Mg Tabs (Azithromycin) .Marland Kitchen... Take two tablets weekly    Protonix 40 Mg Pack (Pantoprazole sodium) .Marland Kitchen... Take 1 pill by mouth daily.    Bactrim Ds 800-160 Mg Tabs (Sulfamethoxazole-trimethoprim) .Marland Kitchen... Take 1 tablet by mouth once a day    Augmentin 875-125 Mg Tabs (Amoxicillin-pot clavulanate) .Marland Kitchen... Take 1 tablet by mouth two times a day for 10 days  Orders: Est. 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 he had no thruh so maybe sign he is actually taking this Orders: Est. Patient Level IV (14782)  Problem # 4:  CACHEXIA (ICD-799.4) Assessment: Comment Only He has known hypogonadism-low testosteron but refused replacment before--could benefit from this but one thin ga t a time.  Problem # 5:  TESTICULAR HYPOFUNCTION (ICD-257.2) Assessment: Comment Only see aboe discussion re  cachexia  Medications Added to Medication List This Visit: 1)  Augmentin 875-125 Mg Tabs (Amoxicillin-pot clavulanate) .... Take 1 tablet by mouth two times a day for 10 days  Other Orders: T-CD4 (95621-30865) T-HIV Viral Load [phone]) T-Comprehensive Metabolic Panel ([phone]) T-HIV Genotype (27253-66440) T-Lipid Profile (34742-59563) Future Orders: T-CD4 (87564-33295) ... 02/15/2009 T-HIV Viral Load ([id number]) ... 02/15/2009 T-CBC w/Diff (01601-09323) ... 02/15/2009 T-Comprehensive Metabolic Panel [phone]) ... 02/15/2009  Patient Instructions: 1)  Please schedule a follow-up appointment in 1 month 2)  We will check chest x ray please stay until we have a read on it 3)  Bring meds with you to next visit  Prescriptions: VICODIN 5-500 MG TABS (HYDROCODONE-ACETAMINOPHEN) take one to two tablets at bedntime for cough as needed  #30 x 2  Entered and Authorized by:   Acey Lav MD   Signed by:   Paulette Blanch Dam MD on 01/18/2009   Method used:   Print then Give to Patient   RxID:   [id number] AUGMENTIN 875-125 MG TABS (AMOXICILLIN-POT CLAVULANATE) Take 1 tablet by mouth two times a day for 10 days  #20 x 0   Entered and Authorized by:   Acey Lav MD   Signed by:   Paulette Blanch Dam MD on 01/18/2009   Method used:   Print then Give to Patient   RxID:   [id number] VICODIN 5-500 MG TABS (HYDROCODONE-ACETAMINOPHEN) take one to two tablets at bedntime for cough as needed  #30 x 2   Entered and Authorized by:   Acey Lav MD   Signed by:   Paulette Blanch Dam MD on 01/18/2009   Method used:   Print then Give to Patient   RxID:   [id number] PROTONIX 40 MG PACK (PANTOPRAZOLE SODIUM) take 1 pill by mouth daily.  #30 x 4   Entered and Authorized by:   Acey Lav MD   Signed by:   Paulette Blanch Dam MD on 01/18/2009   Method used:   Print then Give to Patient   RxID:   [id number] FLUCONAZOLE 200 MG TABS (FLUCONAZOLE) take  two pills  a day until 5/21st then one pill a day  #48 x 12   Entered and Authorized by:   Acey Lav MD   Signed by:   Paulette Blanch Dam MD on 01/18/2009   Method used:   Print then Give to Patient   RxID:   [id number] ZITHROMAX 600 MG TABS (AZITHROMYCIN) take two tablets weekly  #180 x 4   Entered and Authorized by:   Acey Lav MD   Signed by:   Paulette Blanch Dam MD on 01/18/2009   Method used:   Print then Give to Patient   RxID:   [id number] TRUVADA 200-300 MG TABS (EMTRICITABINE-TENOFOVIR) take one tablet with two tablerts of prezista and one capsule of norvir daily  #90 x 4   Entered and Authorized by:   Acey Lav MD   Signed by:   Paulette Blanch Dam MD on 01/18/2009   Method used:   Print then Give to Patient   RxID:   [id number] NORVIR 100 MG CAPS (RITONAVIR) take one capsule daily with two prezista tablets and one truvada tablet  #90 x 4   Entered and Authorized by:   Acey Lav MD   Signed by:   Paulette Blanch Dam MD on 01/18/2009   Method used:   Print then Give to Patient   RxID:   [id number] PREZISTA 400 MG TABS (DARUNAVIR ETHANOLATE) Take two tablets once daily with norvir and truvada  #180 x 4   Entered and Authorized by:   Acey Lav MD   Signed by:   Paulette Blanch Dam MD on 01/18/2009   Method used:   Print then Give to Patient   RxID:   [id number]

## 2010-09-06 NOTE — Progress Notes (Signed)
Summary: Patient assistance med arrived (3 mos supply)  Phone Note Refill Request        Prescriptions: PREZISTA 400 MG TABS (DARUNAVIR ETHANOLATE) Take two tablets once daily with norvir and truvada  #180 x 0   Entered by:   Paulo Fruit  BS,CPht II,MPH   Authorized by:   Acey Lav MD   Signed by:   Paulo Fruit  BS,CPht II,MPH on 12/18/2008   Method used:   Samples Given   RxID:   [id number]   Patient Assist Medication Verification: Medication: Prezista 400mg  Lot#[id number] Exp Date:12 2011 Tech approval:MLD Patient's [employer], Latina, was notified that all of his medication is ready for pick up. She stated she will pick it up on Monday. Paulo Fruit  BS,CPht II,MPH  Dec 18, 2008 2:39 PM

## 2010-09-06 NOTE — Miscellaneous (Signed)
Summary: [facility]  [facility]   Imported By: Florinda Marker 10/30/2008 11:16:49  _____________________________________________________________________  External Attachment:    Type:   Image     Comment:   External Document

## 2010-09-06 NOTE — Progress Notes (Signed)
Summary: Care Plan Oversight  Phone Note Outgoing Call   Call placed by: Acey Lav MD,  September 29, 2009 12:11 PM Details for Reason: Care Plan Oversight Summary of Call: [phone] (30 or more mins)  I have supervised home care and/or infusion therapy for this pt, including providing orders for care, review of labs and/or home health care plans, communicating with the home health care professionals and/or patient/caregivers to integrate current information into the medical treatment plan and/or adjust the medical therapy. This supervision has been provided for _32_minutes during the calendar month. Dates for this oversight 09/07/09 thru 10/06/09.   Initial call taken by: Acey Lav MD,  September 29, 2009 12:12 PM

## 2010-09-06 NOTE — Assessment & Plan Note (Signed)
Summary: left flank pain   Referring Provider:  Paulette Blanch Dam Primary Provider:  Paulette Blanch Dam MD  CC:  pt. c/o burning on urination and lower back pain x 3 weeks.  History of Present Illness: Pt c/o left flank pain for about 3 weeks.  Hurts mostly when he lays down.  It radiates into his left leg. The pain keeps him up at night.  His urine has had an odor and was dark.  He did not see any blood. No fever. he thinks he might have a kidney stone.  Preventive Screening-Counseling & Management  Alcohol-Tobacco     Alcohol drinks/day: 2 per week     Alcohol type: beer     Smoking Status: current     Smoking Cessation Counseling: 04/06/2005     Packs/Day: <0.25  Caffeine-Diet-Exercise     Caffeine use/day: 3     Does Patient Exercise: yes     Type of exercise: walking  Hep-HIV-STD-Contraception     HIV Risk Counseling: 04/06/2005  Safety-Violence-Falls     Seat Belt Use: yes      Sexual History:  none.        Drug Use:  crack cocaine.        Blood Transfusions:  no.        Travel History:  no.    Comments: pt. declined condoms   Updated Prior Medication List: PREZISTA 400 MG TABS (DARUNAVIR ETHANOLATE) Take two tablets once daily with norvir and truvada NORVIR 100 MG CAPS (RITONAVIR) take one capsule daily with two prezista tablets and one truvada tablet TRUVADA 200-300 MG TABS (EMTRICITABINE-TENOFOVIR) take one tablet with two tablerts of prezista and one capsule of norvir daily FLUCONAZOLE 200 MG TABS (FLUCONAZOLE) take two pills  a day until 5/21st then one pill a day BACTRIM DS 800-160 MG TABS (SULFAMETHOXAZOLE-TRIMETHOPRIM) Take 1 tablet by mouth once a day LOMOTIL 2.5-0.025 MG TABS (DIPHENOXYLATE-ATROPINE) Take 2 now and 1-2 every 6 hours as needed diarrhea OMEPRAZOLE 40 MG CPDR (OMEPRAZOLE) Take 1 tablet by mouth once a day CHERATUSSIN AC 100-10 MG/5ML SYRP (GUAIFENESIN-CODEINE) 5 to 10ml three times a day as needed cough, caution for sedating effect ENSURE  HIGH PROTEIN  LIQD (NUTRITIONAL SUPPLEMENTS) three times a day can BENADRYL 25 MG CAPS (DIPHENHYDRAMINE HCL) Take 1 tablet by mouth once a day HIBICLENS 4 % LIQD (CHLORHEXIDINE GLUCONATE) wash twice a day with this soap for 7 days, dispense one bottle BACTROBAN 2 % CREA (MUPIROCIN CALCIUM) apply to inside of nostrils twice a day for 7 days ANDRODERM 2.5 MG/24HR PT24 (TESTOSTERONE) apply to dry skin daily,dispense one month supply DILTIAZEM HCL CR 60 MG XR12H-CAP (DILTIAZEM HCL) e  Current Allergies (reviewed today): No known allergies  Past History:  Past Medical History: Last updated: 06/28/2009 Hepatitis C HIV disease Substance abuse Tobacco abuse 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 crypto + IRIS?  Review of Systems  The patient denies anorexia, fever, weight loss, and abdominal pain.    Vital Signs:  Patient profile:   55 year old male Height:      65 inches (165.10 cm) Weight:      144.4 pounds (65.64 kg) BMI:     24.12 Temp:     98.1 degrees F (36.72 degrees C) oral Pulse rate:   76 / minute BP sitting:   120 / 80  (right arm)  Vitals Entered By: Wendall Mola CMA Duncan Dull) (February 21, 2010 10:46 AM) CC: pt. c/o burning on urination and lower back pain x 3 weeks Is Patient Diabetic? No Pain Assessment Patient in pain? yes     Location: lower back Intensity: 10 Type: aching Onset of pain  Constant Nutritional Status BMI of 19 -24 = normal Nutritional Status Detail appetite "so-so"  Does patient need assistance? Functional Status Self care Ambulation Normal Comments no missed doses of HAART meds per patient   Physical Exam  General:  alert, well-developed, well-nourished, and well-hydrated.   Head:  normocephalic and atraumatic.   Mouth:  pharynx pink and moist.   Lungs:  normal breath sounds.   Additional Exam:  CVA pain on left  with palpation   Impression & Recommendations:  Problem # 1:  FLANK PAIN, LEFT (ICD-789.09) Urine dip negative ? muscular vs. mass on kidney - doubt kidney stone without hematuria CT of abdomen trat for pain His updated medication list for this problem includes:    Vicodin 5-500 Mg Tabs (Hydrocodone-acetaminophen) .Marland Kitchen... Take 1 tablet by mouth every 8 hours prn  Orders: T-Urinalysis Dipstick only (30160FU) Est. Patient Level III (93235) T-Urinalysis (57322-02542) T-Urine Culture (Spectrum Order) [id number]) CT with & without Contrast (CT w&w/o Contrast) CT without Contrast (CT w/o contrast)  Medications Added to Medication List This Visit: 1)  Vicodin 5-500 Mg Tabs (Hydrocodone-acetaminophen) .... Take 1 tablet by mouth every 8 hours prn Prescriptions: VICODIN 5-500 MG TABS (HYDROCODONE-ACETAMINOPHEN) Take 1 tablet by mouth every 8 hours prn  #30 x 0   Entered and Authorized by:   Yisroel Ramming MD   Signed by:   Yisroel Ramming MD on 02/21/2010   Method used:   Print then Give to Patient   RxID:   [id number]   Laboratory Results   Urine Tests  Date/Time Received: Mariea Clonts  February 21, 2010 12:14 PM  Date/Time Reported: Mariea Clonts  February 21, 2010 12:14 PM   Routine Urinalysis   Color: orange Appearance: Cloudy Glucose: negative   (Normal Range: Negative) Bilirubin: small   (Normal Range: Negative) Ketone: negative   (Normal Range: Negative) Spec. Gravity: >=1.030   (Normal Range: 1.003-1.035) Blood: negative   (Normal Range: Negative) pH: 5.5   (Normal Range: 5.0-8.0) Protein: 100   (Normal Range: Negative) Urobilinogen: 0.2   (Normal Range: 0-1) Nitrite: negative   (Normal Range: Negative) Leukocyte Esterace: negative   (Normal Range: Negative)

## 2010-09-06 NOTE — Miscellaneous (Signed)
Summary: [facility]: Home Health Cert. & Plan Of Care  [facility]: Home Health Cert. & Plan Of Care   Imported By: Florinda Marker 07/27/2009 12:02:10  _____________________________________________________________________  External Attachment:    Type:   Image     Comment:   External Document

## 2010-09-06 NOTE — Progress Notes (Signed)
Summary: phone note/ "boils"  Phone Note Other Incoming   Caller: Cherlyn Labella Summary of Call: patient c/o "boils" between buttocks that ruptures and reappear. Could you prescibe doxycycline per Rosanne Ashing? Also can you add Ensure to his medication list so we can fax it to [facility]? Initial call taken by: Starleen Arms CMA,  May 19, 2009 12:04 PM  Follow-up for Phone Call        I will add doxycycline. He needs also to apply warm compresses and if these do not go down he needs to come to cl[facility] vs ED for I and D Follow-up by: Acey Lav MD,  May 20, 2009 9:18 AM    New/Updated Medications: DOXYCYCLINE HYCLATE 100 MG CAPS (DOXYCYCLINE HYCLATE) Take 1 tablet by mouth two times a day for 14 days ENSURE HIGH PROTEIN  LIQD (NUTRITIONAL SUPPLEMENTS) three times a day can Prescriptions: ENSURE HIGH PROTEIN  LIQD (NUTRITIONAL SUPPLEMENTS) three times a day can  #90 x 12   Entered and Authorized by:   Acey Lav MD   Signed by:   Paulette Blanch Dam MD on 05/20/2009   Method used:   Print then Give to Patient   RxID:   [id number] DOXYCYCLINE HYCLATE 100 MG CAPS (DOXYCYCLINE HYCLATE) Take 1 tablet by mouth two times a day for 14 days  #28 x 4   Entered and Authorized by:   Acey Lav MD   Signed by:   Paulette Blanch Dam MD on 05/20/2009   Method used:   Print then Give to Patient   RxID:   [id number]

## 2010-09-06 NOTE — Miscellaneous (Signed)
Summary: [facility]  [facility]   Imported By: Florinda Marker 04/05/2009 16:34:16  _____________________________________________________________________  External Attachment:    Type:   Image     Comment:   External Document

## 2010-09-06 NOTE — Progress Notes (Signed)
Summary: Samples given to patient in house  Phone Note Refill Request    Follow-up for Phone Call        [name] to see [name] in house.  We completed applictions for all of his medication from the Pharmaceutical Patient Assistance Program and they will be signed by Dr. Daiva Eves on [place], weather permitting.  His samples will be located in the main pharmacy and patient is aware that he must pick them up from there before leaving. Follow-up by: Paulo Fruit  BS,CPht II,MPH,  September 11, 2008 12:46 PM      Prescriptions: ZITHROMAX 600 MG TABS (AZITHROMYCIN) take two tablets weekly  #10 x 0   Entered by:   Paulo Fruit  BS,CPht II,MPH   Authorized by:   Acey Lav MD   Signed by:   Paulo Fruit  BS,CPht II,MPH on 09/11/2008   Method used:   Samples Given   RxID:   [id number] BACTRIM DS 800-160 MG TABS (SULFAMETHOXAZOLE-TRIMETHOPRIM) Take 1 tablet by mouth once a day  #30 x 0   Entered by:   Paulo Fruit  BS,CPht II,MPH   Authorized by:   Acey Lav MD   Signed by:   Paulo Fruit  BS,CPht II,MPH on 09/11/2008   Method used:   Samples Given   RxID:   [id number] TRUVADA 200-300 MG TABS (EMTRICITABINE-TENOFOVIR) take one tablet with two tablerts of prezista and one capsule of norvir daily  #30 x 0   Entered by:   Paulo Fruit  BS,CPht II,MPH   Authorized by:   Acey Lav MD   Signed by:   Paulo Fruit  BS,CPht II,MPH on 09/11/2008   Method used:   Samples Given   RxID:   [id number] NORVIR 100 MG CAPS (RITONAVIR) take one capsule daily with two prezista tablets and one truvada tablet  #30 x 0   Entered by:   Paulo Fruit  BS,CPht II,MPH   Authorized by:   Acey Lav MD   Signed by:   Paulo Fruit  BS,CPht II,MPH on 09/11/2008   Method used:   Samples Given   RxID:   [id number] PREZISTA 400 MG TABS (DARUNAVIR ETHANOLATE) Take two tablets once daily with norvir and truvada  #60 x 0   Entered by:   Paulo Fruit  BS,CPht II,MPH   Authorized  by:   Acey Lav MD   Signed by:   Paulo Fruit  BS,CPht II,MPH on 09/11/2008   Method used:   Samples Given   RxID:   [id number]  Sample Given, Lot #:Prezista 400mg  [id number] Expiration Date:10 2011 Patient has been instructed regarding the correct time, dose and frequency of taking this med, including desired effects and most common side effects.  Sample Given, Lot #: Norvir 100mg  [id number] Expiration Date:22 Jun 2010 Patient has been instructed regarding the correct time, dose and frequency of taking this med, including desired effects and most common side effects.  Sample Given, Lot #:Truvada [id number] Expiration Date:06 2013 Patient has been instructed regarding the correct time, dose and frequency of taking this med, including desired effects and most common side effects.  Sample Given, Lot #: Azithromycin 600mg  [id number] Expiration Date:09/11/2009 Patient has been instructed regarding the correct time, dose and frequency of taking this med, including desired effects and most common side effects.  Sample Given, Lot #: SMZ-TMP DS 800/160mg  [id number] Expiration Date:09/11/09 Patient has been instructed regarding the correct time, dose and frequency of taking this med, including desired  effects and most common side effects.   **Dr. Marlana Salvage (Medical Student) who is working with Dr. Daiva Eves on this patient requested samples for patient.  Patient has asked to leave against medical advice.  His doctors who like for him to have medication since he has no other means of purchasing them.  Dr. Janee Morn also asked for Fluconazole 200mg  in which I do not have samples to give.  All other medications were given to patient for a 1 month supply.  He will not qualify for [place] at this time due to the waitlist stipilation that has been applied.  We will try to [facility] Patient Assistance Program. Paulo Fruit  BS,CPht II,MPH  September 11, 2008 12:02 PM

## 2010-09-06 NOTE — Letter (Signed)
Summary: [employer]: Pre-Approval  [employer]: Pre-Approval   Imported By: Florinda Marker 05/14/2009 16:16:59  _____________________________________________________________________  External Attachment:    Type:   Image     Comment:   External Document

## 2010-09-06 NOTE — Progress Notes (Signed)
Summary: contact # for pt  Phone Note From Other Clinic   Caller: jim mcgowan- pt's caseworker Call For: clance Summary of Call: called on behalf of pt to cancel appt for noon today. however, caseworker will see pt tomorrow and get pt to rsc this. also wanted to leave a ph # for pt. [phone] Initial call taken by: Tivis Ringer,  June 08, 2009 9:04 AM  Follow-up for Phone Call        FYI here is another phone number for this pt. Pt cancelled appt to see you today. Carron Curie CMA  June 08, 2009 10:21 AM   Additional Follow-up for Phone Call Additional follow up Details #1::        called and got message..."verizon customer is not available".  could not leave message.  Please send certified letter to pt's address stating he needs to make an office visit apptm with me . Additional Follow-up by: Barbaraann Share MD,  June 09, 2009 9:25 AM    Additional Follow-up for Phone Call Additional follow up Details #2::    letter sent. Carron Curie CMA  June 09, 2009 9:29 AM

## 2010-09-06 NOTE — Letter (Signed)
Summary: Generic Letter  [facility]  [address]   [place], [place] [address]   Phone: [phone]  Fax: [phone]    06/09/2009  Dear [name], [name]  Mr. Mccord is a patient of mine who has several chronic medical conditions that have severely incapicating him. He requires transportation to both my clinic at to Dr. Angelita Ingles in [facility]. He is not fit to ride in a public bus and would benefit from your providing him with transportation. If you have further questions please call my clinic or office at [phone] or [phone]           Sincerely,   Acey Lav MD

## 2010-09-06 NOTE — Progress Notes (Signed)
Summary: speak to nurse  Phone Note From Other Clinic   Caller: Cleophus Molt- nurse case manager Call For: clance Summary of Call: caller wants to know if a f/u appt needs to be made for pt or what needs to be done now that pt had CT. [phone] Initial call taken by: Tivis Ringer,  June 23, 2009 10:36 AM  Follow-up for Phone Call        Please advise if pt needs f/u appt to review CT results or if the nurse will call the pt.Michel Bickers [facility]  June 23, 2009 10:47 AM  Additional Follow-up for Phone Call Additional follow up Details #1::        megan, please see if biopsy results are back on this patient.  check afb, routine cultures, and path. let me know Additional Follow-up by: Barbaraann Share MD,  June 23, 2009 4:10 PM    Additional Follow-up for Phone Call Additional follow up Details #2::    printed off results and put on [facility] desk.  Aundra Millet Reynolds LPN  June 23, 2009 4:26 PM   Additional Follow-up for Phone Call Additional follow up Details #3:: Details for Additional Follow-up Action Taken: let pt know that we are getting some of the preliminary results back.  So far no cancer.  I need to discuss this with Dr Synthia Innocent.  I will call him soon to discuss.  called pt and got verizon message he was not available.  Also called number left and got case worker.  would like to speak with patient!!  He has an apptm with Dr Ainsley Spinner next week to go over biopsy results and further planning. Additional Follow-up by: Barbaraann Share MD,  June 23, 2009 5:14 PM  I spoke with Cleophus Molt and he is aware prelim results show no cancer. He wants KC to know the pt is scheduled to see Dr. Synthia Innocent on Monday, 06/28/09. Michel Bickers CMA  June 23, 2009 5:22 PM

## 2010-09-06 NOTE — Progress Notes (Signed)
Summary: phone note- trying to locate patient  Phone Note Outgoing Call Call back at [phone]   Call placed by: Starleen Arms CMA,  August 28, 2008 2:00 PM Call placed to: Elder Bair(patients brother)  Summary of Call: Patient needs to reenroll in ADAP, but there is no phone number I retrieved an alternate number from [place] [facility] and I will try to get a message to the patient. He needs to start bactrim and azithromycin asap. I left message with the Mr. Mont Jagoda the patients brother. Initial call taken by: Starleen Arms CMA,  August 28, 2008 2:00 PM  Follow-up for Phone Call        Patient is in the hospital [phone]

## 2010-09-06 NOTE — Progress Notes (Signed)
Summary: refill/mld  Phone Note Refill Request      Prescriptions: BACTRIM DS 800-160 MG TABS (SULFAMETHOXAZOLE-TRIMETHOPRIM) Take 1 tablet by mouth once a day  #30 x 6   Entered by:   Paulo Fruit  BS,CPht II,MPH   Authorized by:   Acey Lav MD   Signed by:   Paulo Fruit  BS,CPht II,MPH on 02/17/2010   Method used:   Telephoned to ...       [facility] (retail)       [address]       [place], [place]  [address]       Ph: [phone]       Fax: [phone]   RxID:   [id number] FLUCONAZOLE 200 MG TABS (FLUCONAZOLE) take two pills  a day until 5/21st then one pill a day  #30 x 6   Entered by:   Paulo Fruit  BS,CPht II,MPH   Authorized by:   Acey Lav MD   Signed by:   Paulo Fruit  BS,CPht II,MPH on 02/17/2010   Method used:   Telephoned to ...       [facility] (retail)       [address]       [place], [place]  [address]       Ph: [phone]       Fax: [phone]   RxID:   [id number]  Paulo Fruit  BS,CPht II,MPH  February 17, 2010 12:41 PM

## 2010-09-06 NOTE — Miscellaneous (Signed)
Summary: Orders Update - labs  Clinical Lists Changes  Orders: Added new Test order of T-CBC w/Diff [phone]) - Signed Added new Test order of T-CD4SP [facility]) (CD4SP) - Signed Added new Test order of T-Comprehensive Metabolic Panel [phone]) - Signed Added new Test order of T-HIV Viral Load [phone]) - Signed     Process Orders Check Orders Results:     [facility]: ABN not required for this insurance Order queued for requisitioning for [facility]: April 25, 2010 4:51 PM  Tests Sent for requisitioning (April 25, 2010 4:51 PM):     05/02/2010: [facility] -- T-CBC w/Diff [[id number]] (signed)     05/02/2010: [facility] -- T-Comprehensive Metabolic Panel [[id number]] (signed)     05/02/2010: [facility] -- T-HIV Viral Load [phone] (signed)

## 2010-09-06 NOTE — Assessment & Plan Note (Signed)
Summary: 10/19/2008 Hospital Admission  [facility] ADMISSION H&P  FIRST CONTACT: DR. YANG [phone] SECOND CONTACT: DR. Polly Cobia [phone] (WEEKENDS, OFF-HOURS: [phone], [phone])    History of Present Illness: 55 year old man with HIV disease and Hep C, cocaine abuse. Last CD4 count 40, viral load 200000. 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 Patient is admitted via ED with complaints of fever, headache, shortness of breath of two weeks duration.  Headache is bitemporal, off and on, lasting up to 10 minutes each time. He doesn't know of anything that started / triggered it, denies any trauma, fall or seizures. Goes away on its own, only to recur again. Has nausea but no vomiting. Denies any rash or photophobia. Headache feels similar to the ones he had on earlier admission. Denies any visual, speech, hearing, swallowing problem. Has generalized weakness but no focal weakness. Feels numb on left lower leg. Denies any loss of consciousness.   Shortness of breath is worse for the same duration. Esp with exertion, but he feels SOB even while speaking at rest. Mobility has greatly decreased becaues of this. Eversince going home from last month he has been using cane. Has dry cough and pleuritic chest pain on the anterior chest associated with it. Denies any hemoptysis or orthopnea, leg swelling, weight gain.   Patient also mentions of feeling feverish all the time, although he did not check his temperature. His appetite has gone down and feels he has lost some weight too. Denies any bowel or bladder complaints.   Medications Prior to Update: 1)  Prezista 400 Mg Tabs (Darunavir Ethanolate) .... Take Two Tablets Once Daily With Norvir and Truvada 2)  Norvir 100 Mg Caps (Ritonavir) .... Take One Capsule Daily With Two Prezista Tablets and One Truvada Tablet 3)  Truvada 200-300 Mg Tabs  (Emtricitabine-Tenofovir) .... Take One Tablet With Two Tablerts of Prezista and One Capsule of Norvir Daily 4)  Bactrim Ds 800-160 Mg Tabs (Sulfamethoxazole-Trimethoprim) .... Take 1 Tablet By Mouth Once A Day 5)  Zithromax 600 Mg Tabs (Azithromycin) .... Take Two Tablets Weekly 6)  Fluconazole 200 Mg Tabs (Fluconazole) .... Take 4 Pills Daily For 1 Wk, Then 2 Pills Daily For 2 Months and Then 1 Pill Daily Until Further Recommendation. 7)  Protonix 40 Mg Pack (Pantoprazole Sodium) .... Take 1 Pill By Mouth Daily. 8)  Prednisone 20 Mg Tabs (Prednisone) .... Take Three Tablets A Day Until Instructed By Dr. Daiva Eves To Reduce The Dose  Allergies: No Known Drug Allergies  Past History:  Risk Factors:    Alcohol Use: N/A    >5 drinks/d w/in last 3 months: N/A    Caffeine Use: N/A    Diet: N/A    Exercise: N/A  Risk Factors:    Smoking Status: CURRENT (12/05/2006)    Packs/Day: N/A    Cigars/wk: N/A    Pipe Use/wk: N/A    Cans of tobacco/wk: N/A    Passive Smoke Exposure: N/A  Past Medical History:    Hepatitis C    HIV disease    Substance abuse    Tobacco abuse  Family History:    Mother passed away in her 104's "internal bleeding"    Father is in his 38s, healthy  Social History:    Lives with a friend    Drinks 2 beers on average    One pack of cigarette lasts a week  Past history of cocaine abuse    Used to be a cook.   Review of Systems       Negative except per HPI.    VITAL SIGNS:  Patient Profile:   55 Years Old Male BP supine:   108 / 72 Temp:     100.7 degrees F. Pulse rate:   105 Resp:     22 per minute Sats: 99%/RA  Physical Exam  General:  alert and underweight appearing, temporal wasting, short of breath on speaking.  Head:  normocephalic and atraumatic.   Eyes:  vision grossly intact, pupils equal, pupils round, and pupils reactive to light.   Ears:  no external deformities.   Nose:  no external deformity.   Mouth:  pharynx pink and moist,  no erythema, and no exudates.  poor dentition.  Neck:  supple.   Lungs:  bilateral good air entry, few crackles on right base.  Heart:  normal rate, no murmur, no gallop, and no rub.   Abdomen:  soft and non-tender.   Pulses:  normal peripheral pulses Extremities:  no pedal edema, cyanosis or clubbing Neurologic:  alert & oriented X3 and cranial nerves II-XII intact. generalized reduced strength 4+/5. Diminished sensation on left lower extremity.   Cervical Nodes:  No lymphadenopathy.  Psych:  Normally interactive     Assessment & Plan  This is a 55 year old man with HIV/AIDS.  1. Headache: Concerning for CNS infection, bacterial infection versus, viral encephalitis and fungal eg. cryptococcal meningitis. Doesn't have signs of meningeal irritation but he may not be able to mount any reaction to the infections. CT head negative for any mass lesions ruling out toxo, primary CNS lymphoma. Will get LP to check for meningitis, viral encephalitis and fungal infection, will initiate treatment based on the findings.   2. SOB: Likely related to suspicious pna seen on CXR. Possibilities are broad: ranging from typical and atypical infections of the chest to opportunistic ones incl PCP. Given recent hospitalization he will require broad coverage to treat possible HAP.   3. HIV/AIDS: Will hold off on HAART pending evaluation of current deterioration. Will touch base with ID before reinstituting therapy.   4. Hep C: Will need to check HCV genotype, viral load +/- liver biopsy. However, this can be done as outpatient.   5. Substance abuse: Counselling 6. Tobacco abuse: Counselling plus nicotine patch as needed.

## 2010-09-06 NOTE — Progress Notes (Signed)
Summary: Patien's ARV regimen and other HIV related meds  Phone Note Outgoing Call   Call placed by: Acey Lav MD,  September 09, 2008 3:46 PM Details for Reason: Patient with AIDS, admitted with cryptococcal meningitis, starting him on DRV, RTV and truvada Summary of Call: Patient was admitted with cryptococcemia and meningitis. He is to finish 2 weeks of IV amphotericin and flucytosine followed by consolidation with fluconazole. I have started him on darunavir, ritonavir and truvada. He is at risk for IRIS. He will also be on bactrim and azithromycin for prophylaxis vs PCP and  MAI. He may have been on ADAP in past but did not renew his application. My understanding is that he will be assisted by pharmaceutical company assistance until ADAP is unfrozen to "new" patients. Initial call taken by: Acey Lav MD,  September 09, 2008 3:48 PM    New/Updated Medications: PREZISTA 400 MG TABS (DARUNAVIR ETHANOLATE) Take two tablets once daily with norvir and truvada NORVIR 100 MG CAPS (RITONAVIR) take one capsule daily with two prezista tablets and one truvada tablet TRUVADA 200-300 MG TABS (EMTRICITABINE-TENOFOVIR) take one tablet with two tablerts of prezista and one capsule of norvir daily BACTRIM DS 800-160 MG TABS (SULFAMETHOXAZOLE-TRIMETHOPRIM) Take 1 tablet by mouth once a day ZITHROMAX 600 MG TABS (AZITHROMYCIN) take two tablets weekly FLUCONAZOLE 200 MG TABS (FLUCONAZOLE) take two tablets once daily for 2 months, then one tablet daily until directed to stop by Dr. Daiva Eves   Prescriptions: FLUCONAZOLE 200 MG TABS (FLUCONAZOLE) take two tablets once daily for 2 months, then one tablet daily until directed to stop by Dr. Daiva Eves  #60 x 12   Entered and Authorized by:   Acey Lav MD   Signed by:   Paulette Blanch Dam MD on 09/09/2008   Method used:   Print then Give to Patient   RxID:   [id number] ZITHROMAX 600 MG TABS (AZITHROMYCIN) take two tablets weekly  #8 x  12   Entered and Authorized by:   Acey Lav MD   Signed by:   Paulette Blanch Dam MD on 09/09/2008   Method used:   Print then Give to Patient   RxID:   [id number] BACTRIM DS 800-160 MG TABS (SULFAMETHOXAZOLE-TRIMETHOPRIM) Take 1 tablet by mouth once a day  #31 x 12   Entered and Authorized by:   Acey Lav MD   Signed by:   Paulette Blanch Dam MD on 09/09/2008   Method used:   Print then Give to Patient   RxID:   [id number] TRUVADA 200-300 MG TABS (EMTRICITABINE-TENOFOVIR) take one tablet with two tablerts of prezista and one capsule of norvir daily  #31 x 12   Entered and Authorized by:   Acey Lav MD   Signed by:   Paulette Blanch Dam MD on 09/09/2008   Method used:   Print then Give to Patient   RxID:   [id number] NORVIR 100 MG CAPS (RITONAVIR) take one capsule daily with two prezista tablets and one truvada tablet  #31 x 12   Entered and Authorized by:   Acey Lav MD   Signed by:   Paulette Blanch Dam MD on 09/09/2008   Method used:   Print then Give to Patient   RxID:   [id number] PREZISTA 400 MG TABS (DARUNAVIR ETHANOLATE) Take two tablets once daily with norvir and truvada  #62 x 12   Entered and Authorized by:   Acey Lav MD  Signed by:   Acey Lav MD on 09/09/2008   Method used:   Print then Give to Patient   RxID:   [id number]

## 2010-09-06 NOTE — Miscellaneous (Signed)
Summary: [facility]: Cert.  [facility]: Cert.   Imported By: Florinda Marker 03/03/2010 15:49:15  _____________________________________________________________________  External Attachment:    Type:   Image     Comment:   External Document

## 2010-09-06 NOTE — Assessment & Plan Note (Signed)
Summary: consult for abnormal chest ct   Copy to:  Paulette Blanch Dam Primary Provider/Referring Provider:  Paulette Blanch Dam MD  CC:  Pulmonary Consult.  History of Present Illness: The pt is a 55y/o male who is referred for evaluation of abnormal chest ct.  His ct showed multiple densities in right lung with mild right hilar LN.  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  He gives a history of dry hacking cough for the last 4 mos that is worsening, and has noted a rare streak of blood at times.  He has seen a mild increase in doe, but not a significant difference.  He does have a long h/o tobacco abuse, and continues to smoke.  His appetite and weight have improved a little most recently.  Current Medications (verified): 1)  Prezista 400 Mg Tabs (Darunavir Ethanolate) .... Take Two Tablets Once Daily With Norvir and Truvada 2)  Norvir 100 Mg Caps (Ritonavir) .... Take One Capsule Daily With Two Prezista Tablets and One Truvada Tablet 3)  Truvada 200-300 Mg Tabs (Emtricitabine-Tenofovir) .... Take One Tablet With Two Tablerts of Prezista and One Capsule of Norvir Daily 4)  Zithromax 600 Mg Tabs (Azithromycin) .... Take Two Tablets Weekly 5)  Fluconazole 200 Mg Tabs (Fluconazole) .... Take Two Pills  A Day Until 5/21st Then One Pill A Day 6)  Vicodin 5-500 Mg Tabs (Hydrocodone-Acetaminophen) .... Take One To Two Tablets At Bedntime For Cough As Needed 7)  Bactrim Ds 800-160 Mg Tabs (Sulfamethoxazole-Trimethoprim) .... Take 1 Tablet By Mouth Once A Day 8)  Lomotil 2.5-0.025 Mg Tabs (Diphenoxylate-Atropine) .... Take 2 Now and 1-2 Every 6 Hours As Needed Diarrhea 9)  Omeprazole 40 Mg Cpdr (Omeprazole) .... Take 1 Tablet By Mouth Once A Day 10)  Cheratussin Ac 100-10 Mg/58ml Syrp (Guaifenesin-Codeine) .... 5 To 10ml Three Times A Day As Needed Cough, Caution For Sedating Effect 11)  Doxycycline Hyclate 100 Mg Caps (Doxycycline Hyclate) .... Take 1  Tablet By Mouth Two Times A Day For 14 Days 12)  Ensure High Protein  Liqd (Nutritional Supplements) .... Three Times A Day Can 13)  Benadryl 25 Mg Caps (Diphenhydramine Hcl) .... Take 1 Tablet By Mouth Once A Day  Allergies (verified): No Known Drug Allergies  Past History:  Past Medical History: Reviewed history from 03/08/2009 and no changes required. 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 Mother passed away in her 93's "internal bleeding" Father is in his 54s, healthy siblings are healthy  Social History: Reviewed history from 12/17/2008 and no changes required. Lives with a friend in house. Drinks 2 beers on average pt is a current smoker. 1 to 2 cigs per week.  started at age 68. Past history of cocaine abuse Used to be a cook. pt currently  unemployed. pt is single. pt does not have any children.  Review of Systems       The patient complains of shortness of breath with activity, shortness of breath at rest, non-productive cough, coughing up blood, chest pain, irregular heartbeats, acid heartburn, indigestion, loss of appetite, weight change, abdominal pain, difficulty swallowing, tooth/dental problems, headaches, nasal congestion/difficulty breathing through nose, itching, hand/feet swelling, joint stiffness or pain, and fever.  The patient denies productive cough, sore throat, sneezing, ear ache, anxiety, depression, rash, and change  in color of mucus.    Vital Signs:  Patient profile:   55 year old male Height:      67 inches Weight:      138 pounds O2 Sat:      95 % on Room air Temp:     98.6 degrees F oral Pulse rate:   110 / minute BP sitting:   108 / 80  (left arm) Cuff size:   regular  Vitals Entered By: Arman Filter LPN (May 27, 2009  11:02 AM)  O2 Flow:  Room air CC: Pulmonary Consult Comments Medications reviewed with patient Arman Filter LPN  May 27, 2009 11:16 AM    Physical Exam  General:  thin male in nad Eyes:  PERRLA and EOMI.   Nose:  patent without discharge Mouth:  clear Neck:  tender to palpation, no significant LN, jvd, tmg Lungs:  clear to auscultation except for a few mild rhonchi no wheezing Heart:  rrr, no mrg Abdomen:  soft and nontender, bs+ Extremities:  no edema currently, pulses intact distally Neurologic:  alert and oriented, moves all 4.   Impression & Recommendations:  Problem # 1:  CT, CHEST, ABNORMAL (ICD-793.1) The pt has mild mediastinal and right hilar LN with 2-3 abnormal densities in the right lung.  It is unclear whether these are inflammatory, due to ?opportunistic infection, or due to possible bronchogenic cancer related to his smoking.  I agree with checking a PET scan, but I have told the pt that inflammation and infection can also have a positive PET signal.  Possible interventions can include TTNA or EBUS for LN biopsy with BAL.  Medications Added to Medication List This Visit: 1)  Benadryl 25 Mg Caps (Diphenhydramine hcl) .... Take 1 tablet by mouth once a day  Other Orders: Consultation Level IV (14782)  Patient Instructions: 1)  will review PET scan once results are available.  Will arrange for f/u or discuss with you by phone depending upon results.

## 2010-09-06 NOTE — Miscellaneous (Signed)
Summary: Orders Update - referral for Medication Compliance  Clinical Lists Changes  Orders: Added new Referral order of Misc. Referral (Misc. Ref) - Signed

## 2010-09-06 NOTE — Progress Notes (Signed)
Summary: Need med change/Protonix not covered  Phone Note Other Incoming   Caller: J[facility] nurse Summary of Call: Rosanne Ashing spoke to [facility] and was told that Medicaid will not cover Protonix until patient tries and fails Omeprazole for at least 2 months. I was looking in patient's chart and did not see where he has tried Omeprazole. Can you change in the chart so I can notify [facility]?  Thanks Initial call taken by: Paulo Fruit  BS,CPht II,MPH,  April 20, 2009 10:52 AM  Follow-up for Phone Call        I changed the script to read omeprazole. I think he got protonix because he was inpatient and that is whats on our formulary Follow-up by: Acey Lav MD,  April 20, 2009 10:57 AM    New/Updated Medications: OMEPRAZOLE 40 MG CPDR (OMEPRAZOLE) Take 1 tablet by mouth once a day Prescriptions: OMEPRAZOLE 40 MG CPDR (OMEPRAZOLE) Take 1 tablet by mouth once a day  #31 x 5   Entered and Authorized by:   Acey Lav MD   Signed by:   Paulette Blanch Dam MD on 04/20/2009   Method used:   Print then Give to Patient   RxID:   [id number]   Appended Document: Need med change/Protonix not covered pharmacy notified as well as Cleophus Molt, Homehealthcare Nurse

## 2010-09-06 NOTE — Progress Notes (Signed)
Summary: Pt. Assist meds arrived (3 months supply)  Phone Note Refill Request        Prescriptions: NORVIR 100 MG CAPS (RITONAVIR) take one capsule daily with two prezista tablets and one truvada tablet  #90 x 0   Entered by:   Paulo Fruit  BS,CPht II,MPH   Authorized by:   Acey Lav MD   Signed by:   Paulo Fruit  BS,CPht II,MPH on 09/25/2008   Method used:   Samples Given   RxID:   [id number] PREZISTA 400 MG TABS (DARUNAVIR ETHANOLATE) Take two tablets once daily with norvir and truvada  #180 x 0   Entered by:   Paulo Fruit  BS,CPht II,MPH   Authorized by:   Acey Lav MD   Signed by:   Paulo Fruit  BS,CPht II,MPH on 09/25/2008   Method used:   Samples Given   RxID:   [id number]   Patient Assist Medication Verification: Medication: Norvir 100mg  GUY#[id number] Exp Date:19 Jun 2010 Tech approval:MLD                Patient Assist Medication Verification: Medication:Prezista 400mg  Lot# [id number] Exp Date:11 2011 Tech approval:MLD  Medications will be taken to patient in house. Paulo Fruit  BS,CPht II,MPH  September 25, 2008 11:37 AM

## 2010-09-06 NOTE — Progress Notes (Signed)
Summary: Referred patient to [facility]  Phone Note Other Incoming   Caller: Cleophus Molt [facility] nurse Details for Reason: Obtain info Summary of Call: Cleophus Molt called while with Jaclynn Guarneri to see if we could enroll him into the [facility].  Patient is having trouble affording his $3 copays for his many prescriptions.  Patient has all of his medication except the Truvada and is unable to utilize the Truvada card now from the patient assistance program because he now has Medicaid.    I sent a fax referral to [facility] and Orvis Brill our representative for this area will go out to meet with Artice this afternoon to get him enrolled.  Rosanne Ashing is aware and will still meet with patient. Initial call taken by: Paulo Fruit  BS,CPht II,MPH,  January 21, 2009 11:16 AM  Follow-up for Phone Call        Thanks Byrd Hesselbach, fortunately he has very well suppressed his virus for now while laking the truvada--He definitely needs it asap.. Follow-up by: Acey Lav MD,  January 22, 2009 4:23 PM      Appended Document: Referred patient to [facility] has taken care of everything. They have met with the patient.  We are waiting on the SRO to be faxed for everything to be complete.  PPA picked up his meds from [facility]

## 2010-09-06 NOTE — Medication Information (Signed)
Summary: Pharmacy Doctor: RX  Pharmacy Doctor: RX   Imported By: Florinda Marker 07/20/2009 14:28:45  _____________________________________________________________________  External Attachment:    Type:   Image     Comment:   External Document

## 2010-09-06 NOTE — Assessment & Plan Note (Signed)
Summary: 6 WEEKS RECHECK/CH   Referring Provider:  Paulette Blanch Garrison Primary Provider:  Paulette Blanch Dam MD   History of Present Illness: 55 year old man with HIV, AIDS, hep C, cocaine abuse whom I met when he was dx with crytpococcal meningiti in [place] of 2010.[name] He was placed on prezista, norvir, truvad with dramatic fall in his VLoad and readmission with picture c/w IRIS to crypto and started on prednisone. He was then  readmitted with actual crytpo meningitis later that Spring. He was having problems with persistent cough and I ordered a CT scan which showed multiple nodules that were also suspicious on PET scan for possible pulmonary malignancy. THe pt was evaluated by [name] with [facility] and he obtained a CT guided biopsy of lung nodule. Bx result came back negative for malignancy showing granulomatous inflammation. AFB and fungal cultures are still incubating. The patient continues to suffer from a cough, though he has cut down on his cigarette use. He is still using cheratussin at times but he using it more sparingly becasue he has to pay out of pocket for this cough suppressant. He states that he will occasional not take his ARVs if he has severr nausea but this happens only about once a month. Otherwise he has no specific complaints today. We reviewed his viral load, cd4 counts, medications. We discussed testosterone replacment and he is now open to idea of replacing it (it was 16 when checked in February). His boils that he had on his buttocks have completely resoved with doxycycline treatment and his is willing to try decontamination regimen.   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 Take Two Tablets Once Daily With Norvir and Truvada 2)  Norvir 100 Mg Caps (Ritonavir) .... Take One Capsule Daily With Two Prezista Tablets and One Truvada Tablet 3)  Truvada 200-300 Mg Tabs (Emtricitabine-Tenofovir) .... Take One Tablet With Two Tablerts of Prezista and One Capsule of Norvir Daily 4)  Zithromax 600 Mg Tabs (Azithromycin) .... Take Two Tablets Weekly 5)  Fluconazole 200 Mg Tabs (Fluconazole) .... Take Two Pills  A Day Until 5/21st Then One Pill A Day 6)  Bactrim Ds 800-160 Mg Tabs (Sulfamethoxazole-Trimethoprim) .... Take 1 Tablet By Mouth Once A Day 7)  Lomotil 2.5-0.025 Mg Tabs (Diphenoxylate-Atropine) .... Take 2 Now and 1-2 Every 6 Hours As Needed Diarrhea 8)  Omeprazole 40 Mg Cpdr (Omeprazole) .... Take 1 Tablet By Mouth Once A Day 9)  Cheratussin Ac 100-10 Mg/94ml Syrp (Guaifenesin-Codeine) .... 5 To 10ml Three Times A Day As Needed Cough, Caution For Sedating Effect 10)  Doxycycline Hyclate 100 Mg Caps (Doxycycline Hyclate) .... Take 1 Tablet By Mouth Two Times A Day For 14 Days 11)  Ensure High Protein  Liqd (Nutritional Supplements) .... Three Times A Day Can 12)  Benadryl 25 Mg Caps (Diphenhydramine Hcl) .... Take 1 Tablet By Mouth Once A Day  Current Medications (  verified): 1)  Prezista 400 Mg Tabs (Darunavir Ethanolate) .... Take Two Tablets Once Daily With Norvir and Truvada 2)  Norvir 100 Mg Caps (Ritonavir) .... Take One Capsule Daily With Two Prezista Tablets and One Truvada Tablet 3)  Truvada 200-300 Mg Tabs (Emtricitabine-Tenofovir) .... Take One Tablet With Two Tablerts of Prezista and One Capsule of Norvir Daily 4)  Fluconazole 200 Mg Tabs (Fluconazole) ....  Take Two Pills  A Day Until 5/21st Then One Pill A Day 5)  Bactrim Ds 800-160 Mg Tabs (Sulfamethoxazole-Trimethoprim) .... Take 1 Tablet By Mouth Once A Day 6)  Lomotil 2.5-0.025 Mg Tabs (Diphenoxylate-Atropine) .... Take 2 Now and 1-2 Every 6 Hours As Needed Diarrhea 7)  Omeprazole 40 Mg Cpdr (Omeprazole) .... Take 1 Tablet By Mouth Once A Day 8)  Cheratussin Ac 100-10 Mg/40ml Syrp (Guaifenesin-Codeine) .... 5 To 10ml Three Times A Day As Needed Cough, Caution For Sedating Effect 9)  Ensure High Protein  Liqd (Nutritional Supplements) .... Three Times A Day Can 10)  Benadryl 25 Mg Caps (Diphenhydramine Hcl) .... Take 1 Tablet By Mouth Once A Day 11)  Hibiclens 4 % Liqd (Chlorhexidine Gluconate) .... Wash Twice A Day With This Soap For 7 Days, Dispense One Bottle 12)  Bactroban 2 % Crea (Mupirocin Calcium) .... 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 crypto + IRIS?  Past Surgical History: CT guided Lung nodule biopsy October 2010  Family History: Reviewed history from 05/27/2009 and no changes required. Mother passed away in her 69's "internal bleeding" Father is in his 25s, healthy siblings are healthy  Social History: Reviewed history from 05/27/2009 and no changes required. Lives with a friend in house. Drinks 2 beers on average pt is a current smoker. 1 to 2 cigs per week.  started at age 58. Past history of cocaine abuse Used to be a cook. pt currently  unemployed. pt is single. pt does not have any children.  Review of Systems       The patient complains of prolonged cough and depression.  The patient denies anorexia, fever, weight  loss, chest pain, syncope, dyspnea on exertion, peripheral edema, headaches, hemoptysis, abdominal pain, melena, hematochezia, severe indigestion/heartburn, hematuria, incontinence, suspicious skin lesions, and difficulty walking.    Physical Exam  General:  alert and underweight.  Head:  wasting of facial muscles, atraumatic.   Eyes:  vision grossly intact, pupils equal, pupils round, and pupils reactive to light.   Ears:  no external deformities.  ear piercing(s) noted.   Nose:  no external deformity, no external erythema, and no nasal discharge.   Mouth:  no erythema and no exudates.  no thrush Neck:  supple.  full ROM.   Chest Wall:  no deformities.   Lungs:  normal respiratory effort, no crackles, and no wheezes.   Heart:  normal rate, regular rhythm, no murmur, no gallop, and no rub.   Abdomen:  soft, non-tender, normal bowel sounds, no distention, no guarding.   Msk:  normal ROM.  no joint deformities.   Extremities:  No clubbing, cyanosis, edema, or deformity noted with normal full range of motion of all joints.   Neurologic:  alert & oriented X3.  strength normal  in all extremities and gait normal.   Skin:  no rashes and no ecchymoses.   Psych:  Oriented X3, memory intact for recent and remote, normally interactive, and dysphoric affect.     Impression & Recommendations:  Problem # 1:  HIV DISEASE (ICD-042) Assessment Comment Only  Reasonable control with prezista norvir and truvada. Recheck labs in 4 weeks, and rtc in 6 wks The following medications were removed from the medication list:    Zithromax 600 Mg Tabs (Azithromycin) .[name]... Take two tablets weekly    Doxycycline Hyclate 100 Mg Caps (Doxycycline hyclate) .[name]... Take 1 tablet by mouth two times a day for 14 days His updated medication list for this problem includes:    Fluconazole 200 Mg Tabs (Fluconazole) .[name]... Take two pills  a day until 5/21st then one pill a day    Bactrim Ds 800-160 Mg Tabs  (Sulfamethoxazole-trimethoprim) .[name]... Take 1 tablet by mouth once a day  Diagnostics Reviewed:  HIV: CDC-defined AIDS (12/17/2008)   CD4: 120 (06/16/2009)   WBC: 6.2 (06/15/2009)   Hgb: 13.6 (06/15/2009)   HCT: 42.2 (06/15/2009)   Platelets: 365 (06/15/2009) HIV-1 RNA: 292 (06/15/2009)   HBSAg: NO (10/01/2006)  Orders: Est. 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 Patient Level IV (60454)  Problem # 3:  PULMONARY NODULE (ICD-518.89)  I think that these abnormal lung nodules may represent disseminated cryptococcal infection to which he had vigorous IRIS response. We will continue the fluconazole, repeat CT in 6 months but need vigilance in this case. It is not outside realm of possibility for him to have a malignancy( that was missed on biopsy) that is coexistant with his cryptococcal infection.  Orders: Est. Patient Level IV (99214)Future Orders: CT with Contrast (CT w/ contrast) ... 10/12/2009  Problem # 4:  TESTICULAR HYPOFUNCTION (ICD-257.2) Assessment: Comment Only recheck his testosterone today and if low, will start him on injectable testosterone. May want to get Rosanne Ashing to help him with his first few doses if he is allowed to do this. Orders: T-Testosterone; Total [phone]) Est. 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 I will give him a decontamination regimen  Orders: Est. 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 He does not  want to take "another" pill to help him with depression.  Orders: Est. Patient Level IV (57846)  Medications Added to Medication List This Visit: 1)  Hibiclens 4 % Liqd (Chlorhexidine gluconate) .... Wash twice a day with this soap for 7 days, dispense one bottle 2)  Bactroban 2 % Crea (Mupirocin calcium) .... Apply to inside of nostrils twice a day for 7 days  Other Orders: Hepatitis A Vaccine (Adult Dose) [phone]) Admin 1st Vaccine (28413) Admin 1st Vaccine (24401) Flu Vaccine 52yrs + [id number]) Future Orders: T-CD4SP ([facility]) (CD4SP) ... 08/09/2009 T-HIV Viral Load [phone]) ... 08/09/2009 T-CBC w/Diff ([id number]) ... 08/09/2009 T-Comprehensive Metabolic Panel [id number]) ... 08/09/2009 T-Lipid Profile [id number]) ... 08/09/2009 T-RPR (Syphilis) [id number]) ... 08/09/2009  Patient Instructions: 1)  Make followup appt in January with Dr. Daiva Eves Prescriptions: CHERATUSSIN AC 100-10 MG/5ML SYRP (GUAIFENESIN-CODEINE) 5 to 10ml three times a day as needed cough, caution for sedating effect  #240 x 1   Entered and Authorized by:   Acey Lav MD   Signed by:   Jason Blanch Dam MD on 06/28/2009   Method used:   Printed then faxed to ...       [facility] (retail)       [address]       [place], [place]  [address]       Ph: [phone]       Fax: [phone]   RxID:   [id number] BACTROBAN 2 % CREA (MUPIROCIN CALCIUM) apply to inside of nostrils twice a day for 7 days  #30 x 1   Entered and Authorized by:   Acey Lav MD   Signed by:   Jason Blanch Dam MD on 06/28/2009   Method used:   Faxed to ...       [facility] (retail)       [address]       [place], [place]  [address]       Ph: [phone]       Fax: [phone]   RxID:   [id number] HIBICLENS 4 % LIQD (CHLORHEXIDINE GLUCONATE) wash twice a day with this soap for 7 days, dispense one bottle  #1 x 2   Entered and Authorized by:   Acey Lav MD   Signed by:   Jason Blanch Dam MD on 06/28/2009   Method used:   Faxed to ...       [facility] (retail)       [address]       [place], [place]  [address]       Ph: [phone]       Fax: [phone]   RxID:   [id number]  Process Orders Check Orders Results:     [facility]: ABN not required for this insurance Tests Sent for requisitioning (June 28, 2009 1:08 PM):     06/28/2009: [facility] -- T-Testosterone; Total [phone] (signed)     08/09/2009: [facility] -- T-HIV Viral Load [phone] (signed)     08/09/2009: [facility] -- T-CBC w/Diff [[id number]] (signed)     08/09/2009: [facility] -- T-Comprehensive Metabolic Panel [[id number]] (signed)     08/09/2009: [facility] -- T-Lipid Profile [phone] (signed)     08/09/2009: [facility] -- T-RPR (Syphilis) [phone] (signed)    Immunizations Administered:  Hepatitis A Vaccine # 2:    Vaccine Type: HepA    Site: left deltoid    Mfr: GlaxoSmithKline    Dose: 0.5 ml    Route: IM    Given by: Starleen Arms CMA    Exp. Date: 11/24/2011    Lot #: [id number]    VIS given: 10/25/04 version given June 28, 2009. Flu Vaccine Consent Questions     Do you have a history of severe allergic reactions to this vaccine? no    Any prior history of allergic reactions to egg and/or gelatin? no    Do you have a sensitivity to the preservative Thimersol? no    Do you have a past history of Guillan-Barre Syndrome? no    Do you currently have an acute febrile illness? no    Have you ever had a severe  reaction to latex? no    Vaccine information given and explained to patient? yes    Are you currently pregnant? no    Lot [id number]   Exp Date:11/04/2009   Manufacturer: [employer]    Site Given  Left Deltoid IM .mchsflu

## 2010-09-06 NOTE — Miscellaneous (Signed)
Summary: HCP Continuous Care:Home Health Cert. & Plan Of  Care  HCP Continuous Care:Home Health Cert. & Plan Of  Care   Imported By: Florinda Marker 05/10/2010 08:58:08  _____________________________________________________________________  External Attachment:    Type:   Image     Comment:   External Document

## 2010-09-06 NOTE — Progress Notes (Signed)
Summary: PET  Phone Note From Other Clinic   Caller: Cleophus Molt- nurse case manager for pt Call For: clance Summary of Call: caller requests to speak to nurse re: PET. [phone] Initial call taken by: Tivis Ringer,  June 03, 2009 9:27 AM  Follow-up for Phone Call        Dr. Shelle Iron here is a number where the patient canbe reached. Carron Curie CMA  June 03, 2009 9:52 AM   Additional Follow-up for Phone Call Additional follow up Details #1::        Victorino Dike, this is the same number I have called before and left a message.  I think this is his Child psychotherapist? I tried again today and lmom again to give [place] a cell phone or pager.  If they call back, we need to get one of those. Additional Follow-up by: Barbaraann Share MD,  June 03, 2009 5:08 PM    Additional Follow-up for Phone Call Additional follow up Details #2::    I spoke with Atilano Median, her number is in pt demographics, and advised her that we are having trouble gettin in touch with him. She did not have a working number for him, but stated she would go by his house tomorrow and give him the message to call our office and give contact number for him. Carron Curie CMA  June 03, 2009 5:22 PM

## 2010-09-06 NOTE — Progress Notes (Signed)
Summary: phone note/decreased appetite  Phone Note Other Incoming   Caller: jim mcgowan,rn Summary of Call: Rosanne Ashing was out for his visit today, and states that the patient is taking his medications when he feels like it because it decreases his appetite. Was interested in having something to increase his appetite. Initial call taken by: Starleen Arms CMA,  March 16, 2009 11:34 AM  Follow-up for Phone Call        I will start him on megace. I wish he would have accepted such an offer when he was here in clinic. I am also more than happy to give him something for nausea--which is more likely to be the problem. Megace is more for someon who always has poor appetite. Someone who has poor appetitis because of a medicine generally has a poor appetite because that medicine makes them feel nauseous. I also would like to replace his testosterone and potentially give him growth hormone replacemetn all of which might help his appetite and his cachechia, not to mention reduce chance for fracture but he was uninterested int these. I hope he goes back to taking his meds regularly he was undetectablet at last visit. Follow-up by: Acey Lav MD,  March 16, 2009 2:08 PM    New/Updated Medications: MEGESTROL ACETATE 800 MG/20ML SUSP (MEGESTROL ACETATE) take 20ml once daily for poor appetite, dispense one month supply Prescriptions: MEGESTROL ACETATE 800 MG/20ML SUSP (MEGESTROL ACETATE) take 20ml once daily for poor appetite, dispense one month supply  #30 x 0   Entered and Authorized by:   Acey Lav MD   Signed by:   Paulette Blanch Dam MD on 03/16/2009   Method used:   Print then Give to Patient   RxID:   [id number]

## 2010-09-06 NOTE — Miscellaneous (Signed)
Summary: HCP Continuous Care: Home Health Cert. & Plan Of Care  HCP Continuous Care: Home Health Cert. & Plan Of Care   Imported By: Florinda Marker 02/02/2010 16:29:51  _____________________________________________________________________  External Attachment:    Type:   Image     Comment:   External Document

## 2010-09-06 NOTE — Assessment & Plan Note (Signed)
Summary: rov for abnormal PET scan   Copy to:  Paulette Blanch Dam Primary Provider/Referring Provider:  Paulette Blanch Dam MD  CC:  Follow up to review PET Scan , pt states his cough is worse , and dry non-prod.  History of Present Illness: the pt comes in today for discussion of his PET scan results.  He is very difficult to get in touch with to arrange f/u .  His PET showed the 3 nodules in the right lung to have abnormal FDG uptake, along with the right hilar and anterior med. node.  I have gone over this with him, and answered all of his questions.  Current Medications (verified): 1)  Prezista 400 Mg Tabs (Darunavir Ethanolate) .... Take Two Tablets Once Daily With Norvir and Truvada 2)  Norvir 100 Mg Caps (Ritonavir) .... Take One Capsule Daily With Two Prezista Tablets and One Truvada Tablet 3)  Truvada 200-300 Mg Tabs (Emtricitabine-Tenofovir) .... Take One Tablet With Two Tablerts of Prezista and One Capsule of Norvir Daily 4)  Zithromax 600 Mg Tabs (Azithromycin) .... Take Two Tablets Weekly 5)  Fluconazole 200 Mg Tabs (Fluconazole) .... Take Two Pills  A Day Until 5/21st Then One Pill A Day 6)  Bactrim Ds 800-160 Mg Tabs (Sulfamethoxazole-Trimethoprim) .... Take 1 Tablet By Mouth Once A Day 7)  Lomotil 2.5-0.025 Mg Tabs (Diphenoxylate-Atropine) .... Take 2 Now and 1-2 Every 6 Hours As Needed Diarrhea 8)  Omeprazole 40 Mg Cpdr (Omeprazole) .... Take 1 Tablet By Mouth Once A Day 9)  Cheratussin Ac 100-10 Mg/58ml Syrp (Guaifenesin-Codeine) .... 5 To 10ml Three Times A Day As Needed Cough, Caution For Sedating Effect 10)  Doxycycline Hyclate 100 Mg Caps (Doxycycline Hyclate) .... Take 1 Tablet By Mouth Two Times A Day For 14 Days 11)  Ensure High Protein  Liqd (Nutritional Supplements) .... Three Times A Day Can 12)  Benadryl 25 Mg Caps (Diphenhydramine Hcl) .... Take 1 Tablet By Mouth Once A Day  Allergies (verified): No Known Drug Allergies  Vital Signs:  Patient profile:   55 year  old male Height:      65 inches Weight:      141.4 pounds O2 Sat:      92 % on Room air Temp:     98.3 degrees F oral Pulse rate:   80 / minute BP sitting:   116 / 80  (left arm) Cuff size:   regular  Vitals Entered By: Renold Genta RCP, LPN (June 14, 2009 9:34 AM)  O2 Sat at Rest %:  92 O2 Flow:  Room air CC: Follow up to review PET Scan , pt states his cough is worse , dry non-prod Comments Medications reviewed with patient Renold Genta RCP, LPN  June 14, 2009 9:39 AM    Physical Exam  General:  cachectic male in nad   Impression & Recommendations:  Problem # 1:  CT, CHEST, ABNORMAL (ICD-793.1) the pt's PET scan shows 2/3 of his right lung nodules have high FDG uptake, along with his abnormal right hilar abnormality.  This is very suspicious for bronchogenic cancer, but an infectious/inflammatory process is also possible.  It will be very difficult by bronchoscopy to biopsy the nodules in question, but we can do EBUS to aspirate his abnormal lymph nodes.  I think that TTNA will have the highest yield, and he does have one nodule that is very accessible in the anterior chest adjacent to the chest wall.  Will arrange biopsy of this  by ct guidance.  If nondiagnostic, would do EBUS.  The pt is agreeable to this approach.  Time spent with pt today was  Other Orders: Est. Patient Level III ([address]) Radiology Referral (Radiology)  Patient Instructions: 1)  will arrange for needle biopsy of one of your lung spots by radiology under ct guidance.  Will arrange followup once results are available.

## 2010-09-06 NOTE — Miscellaneous (Signed)
Summary: HCP Continuous Care: Home Health Cert. & Plan Of Care  HCP Continuous Care: Home Health Cert. & Plan Of Care   Imported By: Florinda Marker 02/04/2010 15:39:53  _____________________________________________________________________  External Attachment:    Type:   Image     Comment:   External Document

## 2010-09-06 NOTE — Miscellaneous (Signed)
Summary: RW update  Clinical Lists Changes  Observations: Added new observation of RW VITAL STA: Active (02/18/2010 14:53) Added new observation of PAYOR: Medicaid (02/18/2010 14:53)

## 2010-09-06 NOTE — Progress Notes (Signed)
Summary: Script called in  Phone Note Other Incoming   Caller: Cleophus Molt Reason for Call: Get patient information Summary of Call: Rosanne Ashing called and left message wanting to know if Androderm is covered under patient's Medicare/Medicaid.  Please call him back at [phone].  If it is not covered, wants to know if can get through PAP. Initial call taken by: Paulo Fruit  BS,CPht II,MPH,  September 16, 2009 10:49 AM  Follow-up for Phone Call        I called Rosanne Ashing back and told him that more than likely a PAP company will not pay for the Androderm because patient has Medicare/Medicaid.  I also looked it up and there was not program for Androderm, but there is one for Androgel.  Rosanne Ashing stated that he looked on the Medicare/Medicaid website and saw that Androderm is covered and he asked me to call it in to [facility] for  the patient.  Patient actually has the prescription in his possession. Follow-up by: Paulo Fruit  BS,CPht II,MPH,  September 16, 2009 10:50 AM  Additional Follow-up for Phone Call Additional follow up Details #1::         Medication was called into [facility] per Dalbert Batman request. Additional Follow-up by: Paulo Fruit  BS,CPht II,MPH,  September 16, 2009 10:51 AM    Prescriptions: Cathren Laine 2.5 MG/24HR PT24 (TESTOSTERONE) apply to dry skin daily,dispense one month supply  #30 x 0   Entered by:   Paulo Fruit  BS,CPht II,MPH   Authorized by:   Acey Lav MD   Signed by:   Paulo Fruit  BS,CPht II,MPH on 09/16/2009   Method used:   Telephoned to ...       [facility]       [address]       [place], [place]  [address]       Ph: [phone]       Fax: [phone]   RxID:   [id number]  Paulo Fruit  BS,CPht II,MPH  September 16, 2009 10:52 AM

## 2010-09-06 NOTE — Letter (Signed)
Summary: [facility]  [address]   [place], [place] [address]   Phone: [phone]  Fax: [phone]    06/09/2009  [name] [address] [place], [place]  [address]  Dear [name],      We have been attempting to contact you to schedule a follow-up appointment to discuss results. Please contact our office as soon as poosible so we can get this scheduled. Thank you.      Sincerely,   Dr. Marcelyn Bruins, MD

## 2010-09-06 NOTE — Miscellaneous (Signed)
Summary: HCP Continuous Care: Home Health Cert. & Plan Of Care  HCP Continuous Care: Home Health Cert. & Plan Of Care   Imported By: Florinda Marker 09/30/2009 15:09:59  _____________________________________________________________________  External Attachment:    Type:   Image     Comment:   External Document

## 2010-09-06 NOTE — Progress Notes (Signed)
Summary: [facility] RN eval. abnl BS, cough, pt. not going to UC   Phone Note Other Incoming   Caller: Cleophus Molt, RN, [facility] Summary of Call: RN exam of pt. today.  Breath sounds abnormal, coughing.  Last CD4 170.  RN recommending pt. to be seen by MD.  No pt. appts available this week.  Recommended that pt. go to [facility].  Pt. did not want to go to UC today.  Pt. to make his own follow-up for this problem. Wendall Mola CMA Duncan Dull)  October 20, 2009 12:19 PM    Follow-up for Phone Call        I think the 3pm pt should not be scheduled today since I just saw him on Monday saw we could see Woody in that spot. Follow-up by: Acey Lav MD,  October 20, 2009 2:10 PM     Appended Document: [facility] RN eval. abnl BS, cough, pt. not going to UC  Call to Cleophus Molt, [facility] RN.  Pt. stated that he would go to the ED if he got worse.  Please advise about any medication changes and a call will be made to [facility].

## 2010-09-06 NOTE — Consult Note (Signed)
Summary: [facility]: Referral  [facility]: Referral   Imported By: Florinda Marker 09/02/2008 10:10:10  _____________________________________________________________________  External Attachment:    Type:   Image     Comment:   External Document

## 2010-09-06 NOTE — Assessment & Plan Note (Signed)
Summary: f/u labs-TY   CC:  f/u .  History of Present Illness: 55 year old man with HIV, AIDS, hep C, cocaine abuse whom I met in February while he was being treated for crytpococcal meningitis. He was placed on prezista, norvir, truvad with dramatic fall in his VLoad and readmission for ssx consistent with meningitis--but with sterile CSF and picture c/w IRIS to crypto and started on prednisone. He has been to clinic intermittently since then.  When I last saw him and he was taking boosted prezista without his truada his vl was 75 when checked in July VL was undetectable. I treated him for sinusitis at last visit. He continues to have cough but feels it is better, largely nonproductive and without fevers. He had complained of loose stools after taking his meds and these have improved after lomitil rx which we called in. Cleophus Molt has been following him closely and has been setting up his pill box for hm. I discussed treating his low tetosteroe but he refuse. He was interested in being rx for vitamins and I offered to recheck his b12 and folate ( I believe we had also screened him for vitamin D while he was an inpt.) He refused testosterone replacemtn despite my advising him of consequeences such as osteoporosis, AVN fractures, loss of muscle mass etc.   He is living in a house with a friend. He denies beng sexaully active.   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 Mother passed away in her 72's "internal bleeding" Father is in his 64s, healthy  Social History: Reviewed history from 12/17/2008 and no changes required. Lives with a friend in house. Drinks 2 beers on average One pack of cigarette lasts a week Past  history of cocaine abuse Used to be a cook.   Vital Signs:  Patient profile:   55 year old male Height:      57 inches (144.78 cm) Weight:      125.31 pounds (56.96 kg) BMI:     27.21 Temp:     99.0 degrees F (37.22 degrees C) oral Pulse rate:   91 / minute BP sitting:   124 / 85  (left arm)  Vitals Entered By: Starleen Arms CMA (March 08, 2009 3:03 PM) CC: f/u  Is Patient Diabetic? No Pain Assessment Patient in pain? no      Nutritional Status BMI of 25 - 29 = overweight Nutritional Status Detail nl  Have you ever been in a relationship where you felt threatened, hurt or afraid?No   Does patient need assistance? Functional Status Self care Ambulation Normal   Physical Exam  General:  alert and cachetic.  dysphoric affect. He complained of my being late by 30 minutes (though he was himself late by 30 and had pt placed ahead of hm) Head:  wasting of facial muscles Eyes:  vision grossly intact, pupils equal, pupils round, and pupils reactive to light.   Ears:  no external deformities.   Nose:  no external deformity.   Mouth:  no erythema and no exudates.  no thrush Lungs:  normal respiratory effort, no crackles, and no wheezes.   Heart:  normal rate, regular rhythm, no murmur, no gallop, and no rub.   Abdomen:  soft, non-tender, normal bowel sounds, no distention, no  guarding.   Msk:  normal ROM.   Neurologic:  alert & oriented X3.          Medication Adherence: 03/08/2009   Adherence to medications reviewed with patient. Counseling to provide adequate adherence provided    Prevention For Positives: 03/08/2009   Safe sex practices discussed with patient. Condoms offered.                            Impression & Recommendations:  Problem # 1:  HIV DISEASE (ICD-042) Assessment Improved  I commended hiim on his excllent (perfect ) virological suppresson. I just hope he can keep this up consistently. He had odd disinterested affect today.  The following  medications were removed from the medication list:    Augmentin 875-125 Mg Tabs (Amoxicillin-pot clavulanate) .Marland Kitchen... Take 1 tablet by mouth two times a day for 10 days His updated medication list for this problem includes:    Zithromax 600 Mg Tabs (Azithromycin) .Marland Kitchen... Take two tablets weekly    Fluconazole 200 Mg Tabs (Fluconazole) .Marland Kitchen... Take two pills  a day until 5/21st then one pill a day    Bactrim Ds 800-160 Mg Tabs (Sulfamethoxazole-trimethoprim) .Marland Kitchen... Take 1 tablet by mouth once a day  Diagnostics Reviewed:  HIV: CDC-defined AIDS (12/17/2008)   CD4: 110 (02/19/2009)   WBC: 4.3 (02/18/2009)   Hgb: 15.4 (02/18/2009)   HCT: 46.6 (02/18/2009)   Platelets: 309 (02/18/2009) HIV-1 RNA: <48 copies/mL (02/18/2009)   HBSAg: NO (10/01/2006)  Orders: Est. Patient Level IV ([address])  Problem # 2:  TESTICULAR HYPOFUNCTION (ICD-257.2) Assessment: Deteriorated  Refused testosterone replacemetn once again  Orders: Est. Patient Level IV ([address])  Problem # 3:  CACHEXIA (ICD-799.4) Again would benefit from testoteone and likely from serostim. I will continue to try to push former as he is at risk for osteporosis and factures with his exrremly low testostereon Orders: T-Basic Metabolic Panel [phone]) T-CBC No Diff ([id number]) T-Folic Acid; RBC ([id number]) T-Vitamin B12 (28413-24401) Est. 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 Patient Level IV ([address])  Problem # 5:  HEPATITIS C (ICD-070.51) Assessment: Comment Only  Will refer to [facility] when pt able. Right now that is something that will need to wait.  Orders: Est. Patient Level IV ([address])  Problem # 6:  DIARRHEA (ICD-787.91) Assessment: Comment Only  medication related? Is getting better with lomotil> checked his labs and renal fxn fine. If this flares again bring and work u for [employer], crytposporidium, microspora, giardia  , ova and parasites The following medications were removed from the medication list:    Augmentin 875-125 Mg Tabs (Amoxicillin-pot clavulanate) .Marland Kitchen... Take 1 tablet by mouth two times a day for 10 days His updated medication list for this problem includes:    Zithromax 600 Mg Tabs (Azithromycin) .Marland Kitchen... Take two tablets weekly    Bactrim Ds 800-160 Mg Tabs (Sulfamethoxazole-trimethoprim) .Marland Kitchen... Take 1 tablet by mouth once a day    Lomotil 2.5-0.025 Mg Tabs (Diphenoxylate-atropine) .Marland Kitchen... Take 2 now and 1-2 every 6 hours as needed diarrhea  Orders: Est. Patient Level IV ([address])  Other Orders: Future Orders: T-CD4SP ([facility]) (CD4SP) ... 04/19/2009 T-HIV Viral Load [phone]) ... 04/19/2009 T-Comprehensive Metabolic Panel [id number]) ... 04/19/2009 T-Lipid Profile [phone]) ... 04/19/2009  Patient Instructions: 1)  I am checking some basic labs today. Please make follwoup appt with Dr. Daiva Eves in 2  months    Hepatitis B Vaccine # 2 (to be given today)

## 2010-09-06 NOTE — Letter (Signed)
Summary: [facility] Providers: HIV Services Referral  [facility] Providers: HIV Services Referral   Imported By: Florinda Marker 12/10/2008 14:11:22  _____________________________________________________________________  External Attachment:    Type:   Image     Comment:   External Document

## 2010-09-06 NOTE — Miscellaneous (Signed)
Summary: Immunization Entry    Immunizations Administered:  Tetanus Vaccine:    Vaccine Type: Tdap    Site: right deltoid    Mfr: GlaxoSmithKline    Dose: 0.5 ml    Route: IM    Given by: Starleen Arms CMA    Exp. Date: 10/30/2011    Lot #: [id number]    VIS given: 06/25/07 version given January 13, 2010.

## 2010-09-06 NOTE — Progress Notes (Signed)
Summary: Care Plan Oversight  Phone Note Outgoing Call   Call placed by: Acey Lav MD,  September 29, 2009 12:10 PM Details for Reason: Care Plan Oversight Summary of Call: [address] (30 or more mins)  I have supervised home care and/or infusion therapy for this pt, including providing orders for care, review of labs and/or home health care plans, communicating with the home health care professionals and/or patient/caregivers to integrate current information into the medical treatment plan and/or adjust the medical therapy. This supervision has been provided for _32_minutes during the calendar month. Dates for this oversight 08/07/09 thru 09/07/09.   Initial call taken by: Acey Lav MD,  September 29, 2009 12:11 PM

## 2010-09-06 NOTE — Letter (Signed)
Summary: Generic Letter  [facility]  [address]   [place], [place] [address]   Phone: [phone]  Fax: [phone]    05/10/2009  [name] [address] [place], [place]  [address]  To whom it may concern [name] is currently still recovering from a chronic severe illness and is currently to ill to work or classes. if you have questions please contact our clinic           Sincerely,   Acey Lav MD

## 2010-09-06 NOTE — Progress Notes (Signed)
Summary: phone note-weight loss/TY  Phone Note Other Incoming   Caller: [name] Summary of Call: Patient has lost 10lbs since last Tues, and c/o of 2-3 episodes of diarrhea after taking his HIV meds along with nausea. He also c/o of no appetite. Patient is coming in to see you on Monday. Would you like him to start on something to increase appetite? Initial call taken by: Starleen Arms CMA,  March 02, 2009 11:19 AM  Follow-up for Phone Call        Tamika if he has lost 10 pounds of weight he needs to be seen by clinic MD or ED to make sure he is OK. I dont want him to go into renal failure. Will you get [name] to get ahold of him and if needed bring him to the ED? He has been doing such a fantastic job with his HIV meds Id hate for him to lose faith at this point. Follow-up by: Acey Lav MD,  March 02, 2009 5:15 PM  Additional Follow-up for Phone Call Additional follow up Details #1::        [facility]  directions take 2 tablets now and 1-2 every 6hours. Additional Follow-up by: Starleen Arms CMA,  March 03, 2009 3:38 PM    New/Updated Medications: LOMOTIL 2.5-0.025 MG TABS (DIPHENOXYLATE-ATROPINE) Take 2 now and 1-2 every 6 hours as needed diarrhea Prescriptions: LOMOTIL 2.5-0.025 MG TABS (DIPHENOXYLATE-ATROPINE) Take 2 now and 1-2 every 6 hours as needed diarrhea  #100 x 0   Entered by:   Starleen Arms CMA   Authorized by:   Acey Lav MD   Signed by:   Starleen Arms CMA on 03/03/2009   Method used:   Telephoned to ...       [facility]       [address]       [place], [place]  [address]       Ph: [phone]       Fax: [phone]   RxID:   [id number]

## 2010-09-06 NOTE — Miscellaneous (Signed)
  Clinical Lists Changes  Orders: Added new Service order of Flu Vaccine 36yrs + [phone]) - Signed Added new Service order of Admin 1st Vaccine ([address]) - Signed Observations: Added new observation of FLU VAX#1VIS: 03/01/10 version given June 06, 2010. (06/06/2010 10:01) Added new observation of FLU VAXLOT: 11033P (06/06/2010 10:01) Added new observation of FLU VAX EXP: 11/06/2010 (06/06/2010 10:01) Added new observation of FLU VAXBY: Tamika Yarborough CMA (06/06/2010 10:01) Added new observation of FLU VAXRTE: IM (06/06/2010 10:01) Added new observation of FLU VAX DSE: 0.5 ml (06/06/2010 10:01) Added new observation of FLU VAX SITE: left deltoid (06/06/2010 10:01) Added new observation of FLU VAX: Fluvax 3+ (06/06/2010 10:01)      Immunizations Administered:  Influenza Vaccine # 1:    Vaccine Type: Fluvax 3+    Site: left deltoid    Dose: 0.5 ml    Route: IM    Given by: Starleen Arms CMA    Exp. Date: 11/06/2010    Lot #: [id number]    VIS given: 03/01/10 version given June 06, 2010.  Flu Vaccine Consent Questions:    Do you have a history of severe allergic reactions to this vaccine? no    Any prior history of allergic reactions to egg and/or gelatin? no    Do you have a sensitivity to the preservative Thimersol? no    Do you have a past history of Guillan-Barre Syndrome? no    Do you currently have an acute febrile illness? no    Have you ever had a severe reaction to latex? no    Vaccine information given and explained to patient? yes

## 2010-09-06 NOTE — Progress Notes (Signed)
Summary: Reordered Norvir refill  Reordere refill of Norvir for the next 3 months for patient.  It will arrive in clinic within 2-3 business days. Paulo Fruit  BS,CPht II,MPH  Dec 15, 2008 11:09 AM

## 2010-09-06 NOTE — Progress Notes (Signed)
Summary: Patient assistance med arrived for (3 mos-May,June, July)  Phone Note Refill Request        Prescriptions: NORVIR 100 MG CAPS (RITONAVIR) take one capsule daily with two prezista tablets and one truvada tablet  #90 x 0   Entered by:   Paulo Fruit  BS,CPht II,MPH   Authorized by:   Acey Lav MD   Signed by:   Paulo Fruit  BS,CPht II,MPH on 12/18/2008   Method used:   Samples Given   RxID:   [id number]   Patient Assist Medication Verification: Medication: Norvir 100mg  Capsule Lot# [id number] Exp Date:13 Oct 2010 Tech approval:MLD  Received a 3 month supply via PAP. Although patient has Medicaid, it only covers hospital/doctor visit.  No Meds.  The [employer] takes his medication to him. Currently waiting on patient's Prezistat to arrive from Patient Assistance.  I usually give patient one month at a time so he will not be confused on what meds he is supposed to be taking. Paulo Fruit  BS,CPht II,MPH  Dec 18, 2008 10:40 AM

## 2010-09-06 NOTE — Miscellaneous (Signed)
Summary: Yearly Financial Assessment Check  Clinical Lists Changes  Observations: Added new observation of INCOMESOURCE: NONE (08/06/2007 16:53) Added new observation of HOUSEINCOME: 0  (08/06/2007 16:53) Added new observation of LATINO/HISP: No  (08/06/2007 16:53) Added new observation of REC_MESSAGE: No  (08/06/2007 16:53) Added new observation of RECPHONECALL: No  (08/06/2007 16:53)

## 2010-09-06 NOTE — Letter (Signed)
Summary: Historic Patient File  Historic Patient File   Imported By: Florinda Marker 04/20/2008 15:50:49  _____________________________________________________________________  External Attachment:    Type:   Image     Comment:   External Document

## 2010-09-06 NOTE — Miscellaneous (Signed)
Summary: Homecare Providers  Homecare Providers   Imported By: Florinda Marker 06/23/2010 10:02:27  _____________________________________________________________________  External Attachment:    Type:   Image     Comment:   External Document

## 2010-09-06 NOTE — Letter (Signed)
Summary: Generic Letter  [facility]  [address]   [place], [place] [address]   Phone: [phone]  Fax: [phone]    03/16/2009    Transportation Department,  [name] was seen in our office on 03/08/2009 to be seen by Dr.Vollmer.  If you have questions call [phone]         Sincerely,    Starleen Arms CMA

## 2010-09-06 NOTE — Miscellaneous (Signed)
  Clinical Lists Changes  Observations: Added new observation of YEARAIDSPOS: 2000  (07/05/2010 11:41)

## 2010-09-06 NOTE — Assessment & Plan Note (Signed)
Summary: F/U OV   CC:  [facility]   2. persistent cough at night  and dry cough for long peroids of time causing choking.  History of Present Illness: 55 year old man with HIV, AIDS, hep C, cocaine abuse whom I met in February while he was being treated for crytpococcal meningitis. He was placed on prezista, norvir, truvad with dramatic fall in his VLoad and readmission for ssx consistent with meningitis--but with sterile CSF and picture c/w IRIS to crypto and started on prednisone. 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  He was also treated for possible PCP pneumonia during hospital stay. He claims to have been compliant with his medicaitons since then and knows he is out of norvir though for past week. He could identify his ARV pills, but not the other meds he is supposed to be taking. He has had persistent cough, especialy at night which has caused him to gag at times. He denies headaches, or fevers, but occ "feesl like Im burning up." He has had intermittent diarrhea. He lives with friend in house now.  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    Drinks 2 beers on average    One pack of cigarette lasts a week    Past history of cocaine abuse    Used to be a cook.   Vital Signs:  Patient  profile:   55 year old male Height:      57 inches Weight:      134.8 pounds BMI:     29.28 Temp:     98.9 degrees F oral Pulse rate:   80 / minute BP sitting:   148 / 97  (left arm)  Vitals Entered By: Tomasita Morrow RN (Dec 17, 2008 11:04 AM) CC: [facility]   2. persistent cough at night , dry cough for long peroids of time causing choking Is Patient Diabetic? No Pain Assessment Patient in pain? no      Nutritional Status BMI of 25 - 29 = overweight Nutritional Status Detail normal   Have you ever been in a relationship where you felt threatened, hurt or afraid?No  Domestic Violence Intervention none  Does patient need assistance? Functional Status Self care Ambulation Normal   Physical Exam  General:  alert and cachetic.   Head:  temporal wasting Eyes:  left pupil smaller, (chronically  so) but reactive Ears:  no external deformities.   Nose:  no external deformity.   Mouth:  no erythema and no exudates.   Neck:  supple.   Lungs:  normal respiratory effort, no crackles, and no wheezes.   Heart:  normal rate, regular rhythm,  no murmur, no gallop, and no rub.   Abdomen:  soft, non-tender, normal bowel sounds, no distention, no masses, and no guarding.   Msk:  normal ROM.   Neurologic:  alert & oriented X3.          Medication Adherence: 12/17/2008   Adherence to medications reviewed with patient. Counseling to provide adequate adherence provided    Prevention For Positives: 12/17/2008   Safe sex practices discussed with patient. Condoms offered.                            Impression & Recommendations:  Problem # 1:  HIV DISEASE (ICD-042) Assessment Comment Only  Hopefully he is actually taking his ARVs, Went over all of the meds with him and stressed adherence. He needs to see me in another month with repeat labs then as well. Need to keep in mind his prior IRIS to crypt and possible Needs OI prophylaxsis for PCP and MAI The following medications were removed  from the medication list:    Bactrim Ds 800-160 Mg Tabs (Sulfamethoxazole-trimethoprim) .Marland Kitchen... Take 3 tablet twice a day for two weeks, and then one tablet three times a week. His updated medication list for this problem includes:    Zithromax 600 Mg Tabs (Azithromycin) .Marland Kitchen... Take two tablets weekly    Fluconazole 200 Mg Tabs (Fluconazole) .Marland Kitchen... Take two pills  a day until 5/21st then one pill a day    Bactrim Ds 800-160 Mg Tabs (Sulfamethoxazole-trimethoprim) .Marland Kitchen... Take 1 tablet by mouth once a day  Diagnostics Reviewed:  HIV: CDC-defined AIDS (12/17/2008)   CD4: 40 (08/27/2008)   WBC: 4.0 (08/26/2008)   Hgb: 13.5 (08/26/2008)   HCT: 41.2 (08/26/2008)   Platelets: 398 (08/26/2008) HIV-1 RNA: 200000 (08/26/2008)   HBSAg: NO (10/01/2006)  Orders: Est. 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 The fact taht he has not shown IRIS ssx again, makes me think he hasnt been compliant again, but well see  Orders: Est. Patient Level IV (95621)  Problem # 3:  COUGH (ICD-786.2) His CXR did not show evidence of active pulmonary disease. Will give him some vicodin for cough suppression at bedtime  The following medications were removed from the medication list:    Bactrim Ds 800-160 Mg Tabs (Sulfamethoxazole-trimethoprim) .Marland Kitchen... Take 3 tablet twice a day for two weeks, and then one tablet three times a week. His updated medication list for this problem includes:    Zithromax 600 Mg Tabs (Azithromycin) .Marland Kitchen... Take two tablets weekly    Protonix 40 Mg Pack (Pantoprazole sodium) .Marland Kitchen... Take 1 pill by mouth daily.    Bactrim Ds 800-160 Mg Tabs (Sulfamethoxazole-trimethoprim) .Marland Kitchen... Take 1 tablet by mouth once a day  Orders: CXR- 2view (CXR) Est. 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 Patient Level IV (78469)  Problem # 5:  CACHEXIA (ICD-799.4)  he was also hypogonadal as I recalL (will double check his labs( would benefit from testosterone replacment and possibly growth hormone replacment but I am not going to push TOO many things on  him at the same time  Orders: Est. Patient Level IV (62952)  Medications Added to Medication List This Visit: 1)  Fluconazole 200 Mg Tabs (Fluconazole) .... Take two pills  a day until 5/21st then one pill a day 2)  Vicodin 5-500 Mg Tabs (Hydrocodone-acetaminophen) .... Take one to two tablets at bedntime for cough as needed 3)  Bactrim Ds 800-160 Mg Tabs (Sulfamethoxazole-trimethoprim) .... Take 1 tablet by mouth once a day  Other Orders: T-CD4 ([id number]) T-HIV Viral Load [phone]) T-Comprehensive Metabolic Panel [phone]) T-CBC w/Diff (57846-96295) Hepatitis A Vaccine (Adult Dose) (28413) Admin 1st Vaccine (24401) Hepatitis B Vaccine >18yrs (02725) Admin of Any Addtl Vaccine (36644) Future Orders: T-CD4 (03474-25956) ... 01/28/2009 T-HIV Viral Load [id number]) ... 01/28/2009 T-CBC w/Diff (51884-16606) ... 01/28/2009 T-Lipid Profile [phone]) ... 01/28/2009 T-Comprehensive Metabolic Panel [phone]) ... 01/28/2009  Patient Instructions: 1)  Make followup appt with Dr. Daiva Eves in one months time 2)  We will check a chest xray today, please stay until we have the results 3)  Buy otc lamisil cream and apply to toenails twice a day    Immunizations Administered:  Hepatitis A Vaccine # 1:    Vaccine Type: HepA    Site: left deltoid    Mfr: GlaxoSmithKline    Dose: 1.0 ml    Route: IM    Given by: Tomasita Morrow RN    Exp. Date: 04/23/2011    Lot #: [id number]    VIS given: 10/25/04 version given Dec 17, 2008.  Hepatitis B Vaccine # 1:    Vaccine Type: HepB Adult     Site: right deltoid    Mfr: Merck    Dose: 1.0 ml    Route: IM    Given by: Tomasita Morrow RN    Exp. Date: 03/13/2011    Lot #: [id number]    VIS given: 02/21/06 version given Dec 17, 2008. Prescriptions: BACTRIM DS 800-160 MG TABS (SULFAMETHOXAZOLE-TRIMETHOPRIM) Take 1 tablet by mouth once a day  #93 x 4   Entered and Authorized by:   Acey Lav MD   Signed by:   Paulette Blanch Dam MD on 12/17/2008   Method used:   Print then Give to Patient   RxID:   [id number] PROTONIX 40 MG PACK (PANTOPRAZOLE SODIUM) take 1 pill by mouth daily.  #30 x 4   Entered and Authorized by:   Acey Lav MD   Signed by:   Paulette Blanch Dam MD on 12/17/2008   Method used:   Print then Give to Patient   RxID:   [id number] ZITHROMAX 600 MG TABS (AZITHROMYCIN) take two tablets weekly  #180 x 4   Entered and Authorized by:   Acey Lav MD   Signed by:   Paulette Blanch Dam MD on 12/17/2008   Method used:   Print then Give to Patient   RxID:   [id number] TRUVADA 200-300 MG TABS (EMTRICITABINE-TENOFOVIR) take one tablet with two tablerts of prezista and one capsule of norvir daily  #90 x 4   Entered and Authorized by:   Acey Lav MD   Signed by:   Paulette Blanch Dam MD on 12/17/2008   Method used:   Print then Give to Patient   RxID:   [id number] NORVIR 100 MG CAPS (RITONAVIR) take one capsule daily with two prezista tablets and one truvada tablet  #90 x 4   Entered and Authorized by:   Acey Lav MD   Signed by:   Paulette Blanch Dam MD on 12/17/2008   Method used:   Print then Give  to Patient   RxID:   [id number] PREZISTA 400 MG TABS (DARUNAVIR ETHANOLATE) Take two tablets once daily with norvir and truvada  #180 x 4   Entered and Authorized by:   Acey Lav MD   Signed by:   Paulette Blanch Dam MD on 12/17/2008   Method used:   Print then Give to Patient   RxID:   [id number] VICODIN 5-500 MG TABS (HYDROCODONE-ACETAMINOPHEN) take one to two  tablets at bedntime for cough as needed  #30 x 2   Entered and Authorized by:   Acey Lav MD   Signed by:   Paulette Blanch Dam MD on 12/17/2008   Method used:   Print then Give to Patient   RxID:   [id number] FLUCONAZOLE 200 MG TABS (FLUCONAZOLE) take two pills  a day until 5/21st then one pill a day  #48 x 12   Entered and Authorized by:   Acey Lav MD   Signed by:   Paulette Blanch Dam MD on 12/17/2008   Method used:   Print then Give to Patient   RxID:   [id number]

## 2010-09-06 NOTE — Progress Notes (Signed)
Summary:  (Med change please) Prior authorization for Protonix pending   Phone Note From Pharmacy   Caller: [facility] Request: Needs authorization from insurer Summary of Call: Received a fax prior authorization from PPA in reference to patient's Protonix.  Please have MD to contact [place] Medicaid at [phone] for approval/denial for this claim.  Initial call taken by: Paulo Fruit  BS,CPht II,MPH,  January 27, 2009 10:18 AM  Follow-up for Phone Call        Reviewed patient's EMR medication list past and present.  In order for Medicaid to pay for patient medication, he must try and fail a month supply of Omeprazole before they will pay for Protonix.  Do you want to change medication to Omeprazole? If so , I need dose and instructions to give to [facility]. Thanks Follow-up by: Paulo Fruit  BS,CPht II,MPH,  January 27, 2009 10:25 AM  Additional Follow-up for Phone Call Additional follow up Details #1::        That is completely fine. I could care less if he is getting protonix (it was put on there because he was in the hospital and is on formulary Additional Follow-up by: Acey Lav MD,  January 27, 2009 4:25 PM

## 2010-09-06 NOTE — Miscellaneous (Signed)
Summary: Orders Update  Clinical Lists Changes  Orders: Added new Test order of T-CD4 [phone]) - Signed Added new Test order of T-HIV Viral Load [phone]) - Signed Added new Test order of T-Comprehensive Metabolic Panel ([id number]) - Signed Added new Test order of T-CBC w/Diff ([id number]) - Signed Added new Test order of T-Urinalysis ([id number]) - Signed Added new Test order of T-RPR (Syphilis) [phone]) - Signed Added new Test order of T-Lipid Profile ([id number]) - Signed Added new Test order of T-Chlamydia  Probe, urine [phone]) - Signed Added new Test order of T-GC Probe, urine [phone]) - Signed

## 2010-09-06 NOTE — Miscellaneous (Signed)
Summary: HCP Continuous Care: Home Health Cert. & Plan Of Care  HCP Continuous Care: Home Health Cert. & Plan Of Care   Imported By: Florinda Marker 05/26/2009 11:44:45  _____________________________________________________________________  External Attachment:    Type:   Image     Comment:   External Document

## 2010-09-06 NOTE — Progress Notes (Signed)
Summary: cough, congestion. allergies?/ty  Phone Note Call from Patient   Caller: Cleophus Molt, RN Call For: Paulette Blanch Dam MD Summary of Call: Cherlyn Labella was out to patients home and stated he had a cough, with increase nasal and sinus drainage. His cough worsens when he trys to rest. No c/o fever or pain. Can you call in something for allergy or cough relief.? Initial call taken by: Starleen Arms CMA,  May 05, 2009 2:18 PM  Follow-up for Phone Call        I doubt this is an allergy. He can have cheratussin. He may need to be seen. I am out of town tomorrow and wont be able to see him till [place] if he needs to be seen. Follow-up by: Acey Lav MD,  May 05, 2009 6:39 PM    New/Updated Medications: CHERATUSSIN AC 100-10 MG/5ML SYRP (GUAIFENESIN-CODEINE) 5 to 10ml three times a day as needed cough, caution for sedating effect Prescriptions: CHERATUSSIN AC 100-10 MG/5ML SYRP (GUAIFENESIN-CODEINE) 5 to 10ml three times a day as needed cough, caution for sedating effect  #240 x 1   Entered and Authorized by:   Acey Lav MD   Signed by:   Paulette Blanch Dam MD on 05/05/2009   Method used:   Print then Give to Patient   RxID:   [id number]

## 2010-09-06 NOTE — Discharge Summary (Signed)
Summary: Hospital D/C  Hospital Discharge  Date of admission: 10/19/2008  Date of discharge: 10/25/2008  Brief reason for admission/active problems: 1. Cryptococcal meningitis 2. PCP pneumonia 3. HIV/AIDS 4. Lesion on the floor of the mouth  Followup needed: 1. Fluconazole 400 mg continue for 10 weeks and reduce to prophylactic dose 200 mg daily.  2. After three weeks of therapy change Bactrim to thrice weekly. 3. Continue HAART, make sure patient is compliant with meds.  4. Follow the lesion on the floor of the mouth, consider biopsy if persisting.   The medication and problem lists have been updated.  Please see the dictated discharge summary for details.     Updated Prior Medication List: PREZISTA 400 MG TABS (DARUNAVIR ETHANOLATE) Take two tablets once daily with norvir and truvada NORVIR 100 MG CAPS (RITONAVIR) take one capsule daily with two prezista tablets and one truvada tablet TRUVADA 200-300 MG TABS (EMTRICITABINE-TENOFOVIR) take one tablet with two tablerts of prezista and one capsule of norvir daily BACTRIM DS 800-160 MG TABS (SULFAMETHOXAZOLE-TRIMETHOPRIM) Take 3 tablet twice a day for two weeks, and then one tablet three times a week. ZITHROMAX 600 MG TABS (AZITHROMYCIN) take two tablets weekly FLUCONAZOLE 200 MG TABS (FLUCONAZOLE) Two pills a day for two months and then one pill a day. PROTONIX 40 MG PACK (PANTOPRAZOLE SODIUM) take 1 pill by mouth daily. PREDNISONE 5 MG TABS (PREDNISONE) Continue with two pill a day for one week, then one pill a day for three days, one pill every other day for three days and stop.  Current Allergies: No known allergies   Patient Instructions: 1)  Please call the clinic for follow up appointment. Please ask for follow-up appointment within the next week.

## 2010-09-06 NOTE — Assessment & Plan Note (Signed)
Summary: F/U/VS   CC:  F/U LABS.  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 He was then  readmitted with actual crytpo meningitis later that Spring. SInce then he has actually been able to do a decent job taking his ARV's bringing his VL down at one point to undetectable levels. Cleophus Molt has been monitoring the patient and making sure he uses his pill boxes. Recent notes from Rosanne Ashing have pointed out that the patient had fallen several times and that also Rosanne Ashing had noted extra heart sounds on exam. The patient himself thinks his heart beats "fast at times." The patient denies any syncopal episodes. He complains today of chronic cough for past several months. He coughs so severely at times that he vomits. This can happen as frequently as every day. He admits to occasionally vomiting up his medication. He feels that this cough is made better by anti-histamines (benadryl) and anti-tussives (cheratussin) but no by antibiotics to date. He has noted numbness in the lateral aspec of his right leg for past several months as well. Pt c/o coughing for 3-4 months. At times he coughs to point of vomiting. 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 Take Two Tablets Once Daily With Norvir and  Truvada 2)  Norvir 100 Mg Caps (Ritonavir) .... Take One Capsule Daily With Two Prezista Tablets and One Truvada Tablet 3)  Truvada 200-300 Mg Tabs (Emtricitabine-Tenofovir) .... Take One Tablet With Two Tablerts of Prezista and One Capsule of Norvir Daily 4)  Zithromax 600 Mg Tabs (Azithromycin) .... Take Two Tablets Weekly 5)  Fluconazole 200 Mg Tabs (Fluconazole) .... Take Two Pills  A Day Until 5/21st Then One Pill A Day 6)  Vicodin 5-500 Mg Tabs (Hydrocodone-Acetaminophen) .... Take One To Two Tablets At Bedntime For Cough As Needed 7)  Bactrim Ds 800-160 Mg Tabs (Sulfamethoxazole-Trimethoprim) .... Take 1 Tablet By Mouth Once A Day 8)  Lomotil 2.5-0.025 Mg Tabs (Diphenoxylate-Atropine) .... Take 2 Now and 1-2 Every 6 Hours As Needed Diarrhea 9)  Megestrol Acetate 800 Mg/66ml Susp (Megestrol Acetate) .... Take 20ml Once Daily For Poor Appetite, Dispense One Month Supply 10)  Omeprazole 40 Mg Cpdr (Omeprazole) .... Take 1 Tablet By Mouth Once A Day 11)  Cheratussin Ac 100-10 Mg/55ml Syrp (Guaifenesin-Codeine) .... 5 To 10ml Three Times A Day As Needed Cough, Caution For Sedating Effect  Current Medications (verified): 1)  Prezista 400 Mg Tabs (Darunavir Ethanolate) .... Take Two Tablets Once Daily With Norvir and Truvada 2)  Norvir 100 Mg Caps (Ritonavir) .... Take One Capsule Daily With Two Prezista Tablets and One Truvada Tablet 3)  Truvada 200-300 Mg Tabs (Emtricitabine-Tenofovir) .... Take One Tablet With Two Tablerts of Prezista and One Capsule of Norvir Daily 4)  Zithromax 600 Mg Tabs (  Azithromycin) .... Take Two Tablets Weekly 5)  Fluconazole 200 Mg Tabs (Fluconazole) .... Take Two Pills  A Day Until 5/21st Then One Pill A Day 6)  Vicodin 5-500 Mg Tabs (Hydrocodone-Acetaminophen) .... Take One To Two Tablets At Bedntime For Cough As Needed 7)  Bactrim Ds 800-160 Mg Tabs (Sulfamethoxazole-Trimethoprim) .... Take 1 Tablet By Mouth Once A Day 8)  Lomotil 2.5-0.025 Mg Tabs  (Diphenoxylate-Atropine) .... Take 2 Now and 1-2 Every 6 Hours As Needed Diarrhea 9)  Megestrol Acetate 800 Mg/5ml Susp (Megestrol Acetate) .... Take 20ml Once Daily For Poor Appetite, Dispense One Month Supply 10)  Omeprazole 40 Mg Cpdr (Omeprazole) .... Take 1 Tablet By Mouth Once A Day 11)  Cheratussin Ac 100-10 Mg/26ml Syrp (Guaifenesin-Codeine) .... 5 To 10ml Three Times A Day As Needed Cough, Caution For Sedating Effect  Allergies (verified): No Known Drug Allergies   Preventive Screening-Counseling & Management  Alcohol-Tobacco     Alcohol drinks/day: 2     Alcohol type: beer     Smoking Status: current     Smoking Cessation Counseling: 04/06/2005     Packs/Day: 0.75  Caffeine-Diet-Exercise     Caffeine use/day: 3  Hep-HIV-STD-Contraception     HIV Risk Counseling: 04/06/2005      Sexual History:  none.        Drug Use:  crack cocaine.        Blood Transfusions:  no.        Travel History:  no.     Current Allergies (reviewed today): No known allergies  Family History: Reviewed history from 10/19/2008 and no changes required. Mother passed away in her 75's "internal bleeding" Father is in his 33s, healthy  Social History: Reviewed history from 12/17/2008 and no changes required. Lives with a friend in house. Drinks 2 beers on average One pack of cigarette lasts a week Past history of cocaine abuse Used to be a cook.   Review of Systems       The patient complains of chest pain and prolonged cough.  The patient denies fever, weight loss, hoarseness, syncope, peripheral edema, headaches, hemoptysis, abdominal pain, melena, hematochezia, muscle weakness, suspicious skin lesions, and abnormal bleeding.         he complains of chest pain with coughing but not at other times.  Vital Signs:  Patient profile:   55 year old male Height:      57 inches (144.78 cm) Weight:      135 pounds (61.36 kg) BMI:     29.32 Temp:     98.4 degrees F (36.89 degrees C)  oral Pulse rate:   85 / minute BP sitting:   131 / 82  (left arm)  Vitals Entered By: Starleen Arms CMA (May 10, 2009 10:12 AM) CC: F/U LABS Is Patient Diabetic? No Pain Assessment Patient in pain? no      Nutritional Status BMI of 25 - 29 = overweight Nutritional Status Detail NL  Does patient need assistance? Functional Status Self care Ambulation Normal   Physical Exam  General:  alert and underweight. He again was anxious to leave the appt Head:  wasting of facial muscles, no frontal sinus tenderness Eyes:  vision grossly intact, pupils equal, pupils round, and pupils reactive to light.   Ears:  no external deformities.   Nose:  no external deformity.   Mouth:  no erythema and no exudates.  no thrush Lungs:  normal respiratory effort, no crackles, and no wheezes.   Heart:  normal rate, regular rhythm, no murmur, no gallop, and no rub.   Abdomen:  soft, non-tender, normal bowel sounds, no distention, no guarding.   Msk:  normal ROM.   Extremities:  no edema Neurologic:  alert & oriented X3.   Skin:  no rashes.   Cervical Nodes:  no anterior cervical adenopathy and no posterior cervical adenopathy.   Psych:  Oriented X3.      Impression & Recommendations:  Problem # 1:  HIV DISEASE (ICD-042)  He continues to suppress virus down to lower 100s of copies and cd4 now at 150. he is  gaining weight as well. I I am disturbed by his vomiting with his coughing. He claims he does not typically vomit up medicine. I had rx him cheratussin for his cough but he had not yet picked this up. continue pcp prophylaxis, and finish fluconazole course for cryptococcal meningitis. His updated medication list for this problem includes:    Zithromax 600 Mg Tabs (Azithromycin) .Marland Kitchen... Take two tablets weekly    Fluconazole 200 Mg Tabs (Fluconazole) .Marland Kitchen... Take two pills  a day until 5/21st then one pill a day    Bactrim Ds 800-160 Mg Tabs (Sulfamethoxazole-trimethoprim) .Marland Kitchen... Take 1  tablet by mouth once a day  Diagnostics Reviewed:  HIV: CDC-defined AIDS (12/17/2008)   CD4: 120 (04/22/2009)   WBC: 4.1 (03/08/2009)   Hgb: 16.5 (03/08/2009)   HCT: 49.6 (03/08/2009)   Platelets: 331 (03/08/2009) HIV-1 RNA: 159 (04/21/2009)   HBSAg: NO (10/01/2006)  Orders: Est. Patient Level V (96045)  Problem # 2:  COUGH (ICD-786.2) I am getting A CT scan to evaluate cause of persistent cough. He does continue to smoke (which is not helping). I have writtne for anti-tussives. He can take otc anti-histamine as well. I am bit worried about more ominous process though  his weight gain is encouraging His updated medication list for this problem includes:    Zithromax 600 Mg Tabs (Azithromycin) .Marland Kitchen... Take two tablets weekly    Bactrim Ds 800-160 Mg Tabs (Sulfamethoxazole-trimethoprim) .Marland Kitchen... Take 1 tablet by mouth once a day    Omeprazole 40 Mg Cpdr (Omeprazole) .Marland Kitchen... Take 1 tablet by mouth once a day    Cheratussin Ac 100-10 Mg/76ml Syrp (Guaifenesin-codeine) .Marland KitchenMarland KitchenMarland KitchenMarland Kitchen 5 to 10ml three times a day as needed cough, caution for sedating effect  Orders: CT with Contrast (CT w/ contrast) Est. 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  Orders: Est. Patient Level V (19147)  Problem # 4:  TESTICULAR HYPOFUNCTION (ICD-257.2)  He tested low on serum testosterone while in the hospital(it is in echart-not in emr) He refused therapy several times. I will continue to offer it to him. I have explained the consequences including effect on bone desnity, muscle strength, lipids etc  Orders: Est. Patient Level V (82956)  Problem # 5:  HEPATITIS C (ICD-070.51)  in ideal world woud like him seen by hepatologist for evaution for biopsy and consideration for posssible rx. Will consider repeat afp and [place] of abdomen  Orders: Est. Patient Level V (21308)  Problem # 6:  UNSPECIFIED FALL (ICD-E888.9)  He denies syncope. ?  mechanical. I ordered ekg today and he was nsr without tw changes. He did have prolonged qt. will conser tte. He does not have orthostatic ssx at present  Orders: Est. Patient Level V (65784)  Problem # 7:  CHEST PAIN, ATYPICAL (ICD-786.59)  has this with cough. Does not  sound cardiogenic  Orders: Est. Patient Level V (04540)  Problem # 8:  UNSPECIFIED TACHYCARDIA (ICD-785.0)  he was nsr in clinic without st , t changes. May bet tte  Orders: Est. Patient Level V [phone])  Other Orders: Hepatitis B Vaccine >53yrs [phone]) Admin 1st Vaccine (95621) Future Orders: T-CD4SP ([facility]) (CD4SP) ... 06/21/2009 T-HIV Viral Load [id number]) ... 06/21/2009 T-CBC w/Diff ([id number]) ... 06/21/2009 T-Comprehensive Metabolic Panel [phone]) ... 06/21/2009  Patient Instructions: 1)  Please schedule a follow-up appointment in 4- 6 weeks 2)  Please bring medicaitons with you to next visit  Prescriptions: CHERATUSSIN AC 100-10 MG/5ML SYRP (GUAIFENESIN-CODEINE) 5 to 10ml three times a day as needed cough, caution for sedating effect  #240 x 1   Entered and Authorized by:   Acey Lav MD   Signed by:   Paulette Blanch Dam MD on 05/10/2009   Method used:   Print then Give to Patient   RxID:   [id number]  Process Orders Check Orders Results:     [facility]: ABN not required for this insurance Tests Sent for requisitioning (May 10, 2009 11:11 PM):     06/21/2009: [facility] -- T-HIV Viral Load [phone] (signed)     06/21/2009: [facility] -- T-CBC w/Diff [[id number]] (signed)     06/21/2009: [facility] -- T-Comprehensive Metabolic Panel [phone] (signed)    Immunizations Administered:  Hepatitis B Vaccine # 2:    Vaccine Type: HepB Adult    Site: left deltoid    Mfr: Merck    Dose: 0.5 ml    Route: IM    Given by: Starleen Arms CMA    Exp. Date: 05/15/2011    Lot #: [id number]    VIS  given: 02/21/06 version given May 10, 2009.

## 2010-09-06 NOTE — Miscellaneous (Signed)
Summary: hospital discharge  Clinical Lists Changes "Hospital Discharge  Date of admission: 08/27/08  Date of discharge: 09/12/08  Brief reason for admission/active problems: Came with cough, fever and CP. W/u positive for disseminated cryptococcal infection, including CSF. Treated with 400 mg of fluconazole for 4 days and later switched to ampho and flucytosine for 6 days: Ideally should have gotten latter for 2 wks, but insists on leaving and so discharging on fluconazole 800 mg for 1 wk, 400 mg for 2 mo and then 200mg  for long term, Dr. Daiva Eves to decide this. Cr. increased from normal to 1.61 on the day of d/c, potentially from ampho vs flucytosine but he won't be getting these further. Has appt. with Dr. Daiva Eves in 2/22 but needs BMET in a few days.   Followup needed: BMET, residual symptoms and watch closely for IRIS as he was started on anti-rhetroviral meds per Dr. Daiva Eves  The medication and problem lists have been updated.  Please see the dictated discharge summary for details." Problems: Added new problem of ACUTE KIDNEY FAILURE UNSPECIFIED, SECONDARY TO MEDICATIONS (ICD-584.9) Medications: Changed medication from FLUCONAZOLE 200 MG TABS (FLUCONAZOLE) take two tablets once daily for 2 months, then one tablet daily until directed to stop by Dr. Daiva Eves to FLUCONAZOLE 200 MG TABS (FLUCONAZOLE) take 4 pills daily for 1 wk, then 2 pills daily for 2 months and then 1 pill daily until further recommendation. Added new medication of PROTONIX 40 MG PACK (PANTOPRAZOLE SODIUM) take 1 pill by mouth daily. Orders: Added new Test order of T-Basic Metabolic Panel [phone]) - Signed Added new Test order of T-Basic Metabolic Panel [phone]) - Signed

## 2010-09-06 NOTE — Progress Notes (Signed)
Summary: retuned call  Phone Note From Other Clinic   Caller: jim mcgowan- nurse case mngr for pt Call For: clance Summary of Call: call jim mcgowan (pt doesn't have a phone) on his work cell # which he states he keeps w/ him- [phone] Initial call taken by: Tivis Ringer,  June 04, 2009 11:05 AM  Follow-up for Phone Call        He may keep with him, but does not answer.  I have left multiple messages.  Please schedule the pt for an ov anyway.  I need to talk with pt, not his Child psychotherapist. Follow-up by: Barbaraann Share MD,  June 04, 2009 3:07 PM  Additional Follow-up for Phone Call Additional follow up Details #1::        Pt schedueld to see [facility] 11/2 at 12

## 2010-09-06 NOTE — Miscellaneous (Signed)
Summary: HomeCare Providers: Case Comm. Report  HomeCare Providers: Case Comm. Report   Imported By: Florinda Marker 04/26/2009 12:06:11  _____________________________________________________________________  External Attachment:    Type:   Image     Comment:   External Document

## 2010-09-06 NOTE — Progress Notes (Signed)
Summary: Patient assistance meds arrived (3 months supply)  Phone Note Refill Request        Prescriptions: FLUCONAZOLE 200 MG TABS (FLUCONAZOLE) Two pills a day for two months and then one pill a day.  #30 x 0   Entered by:   Paulo Fruit  BS,CPht II,MPH   Authorized by:   Acey Lav MD   Signed by:   Paulo Fruit  BS,CPht II,MPH on 11/20/2008   Method used:   Samples Given   RxID:   [id number] ZITHROMAX 600 MG TABS (AZITHROMYCIN) take two tablets weekly  #180 x 0   Entered by:   Paulo Fruit  BS,CPht II,MPH   Authorized by:   Acey Lav MD   Signed by:   Paulo Fruit  BS,CPht II,MPH on 11/20/2008   Method used:   Samples Given   RxID:   [id number]   Patient Assist Medication Verification: Medication: Diflucan 200mg  FUX#[id number] Exp Date:Oct 14 Tech approval:MLD                Patient Assist Medication Verification: Medication: Zithromax 600mg  Lot#[id number] Exp Date:Feb 11 Tech approval:MLD  **Only giving patient one month at a time to help with compliance issues.** Notified the [employer], Latina Applegate, that patient's medication is now ready to be picked up. Paulo Fruit  BS,CPht II,MPH  November 20, 2008 10:04 AM                 Appended Document: Patient assistance meds arrived (3 months supply) error in [place].  Diflucan 200mg  #180 tablets arrived. Zithromax 600mg  #30

## 2010-09-06 NOTE — Progress Notes (Signed)
Summary: phone note-ty  Phone Note Outgoing Call   Summary of Call: Patient missed appt for PET scan on 05/21/2009 due to transportation, spoke with Rosanne Ashing the patient has 3 bus passes and will use them to get to his rescheduled appt. His new appt is 06/01/2009 at 10:00am arrive at 9:30am. Called Cleophus Molt, RN adherence nurse and gave him the appt. time. Initial call taken by: Starleen Arms CMA,  May 26, 2009 11:41 AM

## 2010-09-06 NOTE — Assessment & Plan Note (Signed)
Summary: rov for review of biopsy results.   Copy to:  [name] Primary Eward Rutigliano/Referring [name]:  Jason Blanch Dam MD  CC:  Pt is here for a f/u appt.  Pt c/o coughing up white sputum.  Pt also c/o increased sob.  Pt states he is still smoking approx 1 cig a week.  Marland Kitchen  History of Present Illness: the pt comes in today for f/u after his recent ttna of his right lung densities.  These had only a mild uptake on PET.  The path returned c/w granulomatous inflammation.  The pt has a h/o of infection with cryptococcus, and this more than likely represents the etiology for the masses in question.  I have discussed the results with the patient.  Medications Prior to Update: 1)  Prezista 400 Mg Tabs (Darunavir Ethanolate) .... Take Two Tablets Once Daily With Norvir and Truvada 2)  Norvir 100 Mg Caps (Ritonavir) .... Take One Capsule Daily With Two Prezista Tablets and One Truvada Tablet 3)  Truvada 200-300 Mg Tabs (Emtricitabine-Tenofovir) .... Take One Tablet With Two Tablerts of Prezista and One Capsule of Norvir Daily 4)  Fluconazole 200 Mg Tabs (Fluconazole) .... Take Two Pills  A Day Until 5/21st Then One Pill A Day 5)  Bactrim Ds 800-160 Mg Tabs (Sulfamethoxazole-Trimethoprim) .... Take 1 Tablet By Mouth Once A Day 6)  Lomotil 2.5-0.025 Mg Tabs (Diphenoxylate-Atropine) .... Take 2 Now and 1-2 Every 6 Hours As Needed Diarrhea 7)  Omeprazole 40 Mg Cpdr (Omeprazole) .... Take 1 Tablet By Mouth Once A Day 8)  Cheratussin Ac 100-10 Mg/25ml Syrp (Guaifenesin-Codeine) .... 5 To 10ml Three Times A Day As Needed Cough, Caution For Sedating Effect 9)  Ensure High Protein  Liqd (Nutritional Supplements) .... Three Times A Day Can 10)  Benadryl 25 Mg Caps (Diphenhydramine Hcl) .... Take 1 Tablet By Mouth Once A Day 11)  Hibiclens 4 % Liqd (Chlorhexidine Gluconate) .... Wash Twice A Day With This Soap For 7 Days, Dispense One Bottle 12)  Bactroban 2 % Crea (Mupirocin Calcium) .... Apply To Inside  of Nostrils Twice A Day For 7 Days  Allergies (verified): No Known Drug Allergies  Vital Signs:  Patient profile:   55 year old male Height:      65 inches Weight:      144.25 pounds BMI:     24.09 O2 Sat:      96 % on Room air Temp:     98.2 degrees F oral Pulse rate:   81 / minute BP sitting:   112 / 80  (left arm) Cuff size:   regular  Vitals Entered By: Arman Filter LPN (July 05, 2009 10:46 AM)  O2 Flow:  Room air CC: Pt is here for a f/u appt.  Pt c/o coughing up white sputum.  Pt also c/o increased sob.  Pt states he is still smoking approx 1 cig a week.   Comments Medications reviewed with patient. Megan Reynolds LPN  July 05, 2009 10:46 AM    Physical Exam  General:  thin male in nad   Impression & Recommendations:  Problem # 1:  PULMONARY NODULE (ICD-518.89)  the pt has granulomatous inflammation on his recent TTNA that I suspect is due to his h/o cryptococcal infection.  He is being followed closely by ID, and is on treatment currently with fluconazole.  I totally agree that he needs close f/u of the nodules given his smoking history.  Only one of them were biopsied, and  there was variable FDG uptake of the various ones on PET.  Dr. Synthia Innocent plans a f/u ct chest 6mos from his prior.Marland KitchenMarland KitchenI agree with this approach  Other Orders: Est. Patient Level II (16109)  Patient Instructions: 1)  no change in treatment from a pulmonary standpoint 2)  keep f/u apptms with Dr. Synthia Innocent, and f/u with me as needed.

## 2010-09-06 NOTE — Consult Note (Signed)
Summary: Homecare Providers & Continuous: Case Comm.  Homecare Providers & Continuous: Case Comm.   Imported By: Florinda Marker 05/24/2009 14:37:59  _____________________________________________________________________  External Attachment:    Type:   Image     Comment:   External Document

## 2010-09-06 NOTE — Miscellaneous (Signed)
Summary: Homecare Providers  Homecare Providers   Imported By: Florinda Marker 10/26/2009 14:25:14  _____________________________________________________________________  External Attachment:    Type:   Image     Comment:   External Document

## 2010-09-06 NOTE — Assessment & Plan Note (Signed)
Summary: F/U VISIT/CH   Referring Provider:  Paulette Blanch Dam Primary Provider:  Paulette Blanch Dam MD  CC:  cough and area on .  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 He was then  readmitted with actual crytpo meningitis later that Spring. He was having problems with persistent cough and I ordered a CT scan which showed multiple nodules that were also suspicious on PET scan for possible pulmonary malignancy. THe pt was evaluated by Marcelyn Bruins with [facility] and he obtained a CT guided biopsy of lung nodule. Bx result came back negative for malignancy showing granulomatous inflammation. AFB and fungal cultures never grew an organism, and his serum crypto ag was negative. I have been treating him for cryptococcal disease nontheless. He has not been seen by me for several months. HE claims to be adherent to his ARV reigimen. He denies being sexually active. He complains about me being 15 minutes late to see him. He also has noticed a lesion on his left foot that hurts him when he bears weight on it for too long. He says he had an outbreak of MRSA boils that resolved with treatment with doxcycline on buttocks but says thatthey have resolved and willl not let me examine this area.   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 Take Two Tablets Once Daily With Norvir and Truvada 2)  Norvir 100 Mg Caps (Ritonavir) .... Take One Capsule Daily With Two Prezista Tablets and One Truvada Tablet 3)  Truvada 200-300 Mg Tabs (Emtricitabine-Tenofovir) .... Take One Tablet With Two Tablerts of Prezista and One Capsule of Norvir Daily 4)  Fluconazole 200 Mg Tabs (Fluconazole) .... Take Two Pills  A Day Until 5/21st Then One Pill A Day 5)  Bactrim Ds 800-160 Mg Tabs (Sulfamethoxazole-Trimethoprim) .... Take 1 Tablet By Mouth Once A Day 6)  Lomotil 2.5-0.025 Mg Tabs (Diphenoxylate-Atropine) .... Take 2 Now and 1-2 Every 6 Hours As Needed Diarrhea 7)  Omeprazole 40 Mg Cpdr (Omeprazole) .... Take 1 Tablet By Mouth Once A Day 8)  Cheratussin Ac 100-10 Mg/44ml Syrp (Guaifenesin-Codeine) .... 5 To 10ml Three Times A Day As Needed Cough, Caution For Sedating Effect 9)  Ensure High Protein  Liqd (Nutritional Supplements) .... Three Times A Day Can 10)  Benadryl 25 Mg Caps (Diphenhydramine Hcl) .... Take 1 Tablet By Mouth Once A Day 11)  Hibiclens 4 % Liqd (Chlorhexidine Gluconate) .... Wash Twice A Day With This Soap For 7 Days, Dispense One Bottle 12)  Bactroban  2 % Crea (Mupirocin Calcium) .... Apply To Inside of Nostrils Twice A Day For 7 Days  Current Medications (verified): 1)  Prezista 400 Mg Tabs (Darunavir Ethanolate) .... Take Two Tablets Once Daily With Norvir and Truvada 2)  Norvir 100 Mg Caps (Ritonavir) .... Take One Capsule Daily With Two Prezista Tablets and One Truvada Tablet 3)  Truvada 200-300 Mg Tabs (Emtricitabine-Tenofovir) .... Take One Tablet With Two Tablerts of  Prezista and One Capsule of Norvir Daily 4)  Fluconazole 200 Mg Tabs (Fluconazole) .... Take Two Pills  A Day Until 5/21st Then One Pill A Day 5)  Bactrim Ds 800-160 Mg Tabs (Sulfamethoxazole-Trimethoprim) .... Take 1 Tablet By Mouth Once A Day 6)  Lomotil 2.5-0.025 Mg Tabs (Diphenoxylate-Atropine) .... Take 2 Now and 1-2 Every 6 Hours As Needed Diarrhea 7)  Omeprazole 40 Mg Cpdr (Omeprazole) .... Take 1 Tablet By Mouth Once A Day 8)  Cheratussin Ac 100-10 Mg/19ml Syrp (Guaifenesin-Codeine) .... 5 To 10ml Three Times A Day As Needed Cough, Caution For Sedating Effect 9)  Ensure High Protein  Liqd (Nutritional Supplements) .... Three Times A Day Can 10)  Benadryl 25 Mg Caps (Diphenhydramine Hcl) .... Take 1 Tablet By Mouth Once A Day 11)  Hibiclens 4 % Liqd (Chlorhexidine Gluconate) .... Wash Twice A Day With This Soap For 7 Days, Dispense One Bottle 12)  Bactroban 2 % Crea (Mupirocin Calcium) .... Apply To Inside of Nostrils Twice A Day For 7 Days 13)  Androderm 2.5 Mg/24hr Pt24 (Testosterone) .... 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 crypto + IRIS?  Past Surgical History: Last updated: 06/28/2009 CT guided Lung nodule biopsy October 2010  Family History: Last updated: 05-31-2009 Mother passed away in her 44's "internal bleeding" Father is in his 47s, healthy siblings are  healthy  Social History: Last updated: 05-31-09 Lives with a friend in house. Drinks 2 beers on average pt is a current smoker. 1 to 2 cigs per week.  started at age 65. Past history of cocaine abuse Used to be a cook. pt currently  unemployed. pt is single. pt does not have any children.  Risk Factors: Alcohol Use: 2 (09/09/2009) Caffeine Use: 3 (05/10/2009)  Risk Factors: Smoking Status: current (09/09/2009) Packs/Day: 0.75 (09/09/2009)  Family History: Reviewed history from 05/31/09 and no changes required. Mother passed away in her 65's "internal bleeding" Father is in his 35s, healthy siblings are healthy  Social History: Reviewed history from 05/31/09 and no changes required. Lives with a friend in house. Drinks 2 beers on average pt is a current smoker. 1 to 2 cigs per week.  started at age 85. Past history of cocaine abuse Used to be a cook. pt currently  unemployed. pt is single. pt does not have any children.  Vital Signs:  Patient profile:   55 year old male Height:      65 inches (165.10 cm) Weight:      154  pounds (70 kg) BMI:     25.72 Pulse rate:   93 / minute BP sitting:   151 / 90  (left arm)  Vitals Entered By: Starleen Arms CMA (September 09, 2009 3:04 PM) CC: cough, area on  Is Patient Diabetic? No Nutritional Status BMI of 25 - 29 = overweight Nutritional Status Detail nl  Does patient need assistance? Functional Status Self care Ambulation Normal   Physical Exam  General:  alert and underweight.  Head:  wasting of facial muscles, atraumatic.   Eyes:  vision grossly intact, pupils equal, pupils round, and pupils reactive to light.   Ears:  no external deformities.  ear piercing(s) noted.   Mouth:  no erythema and no exudates.  no thrush Neck:  supple.  full ROM.   Lungs:  normal respiratory effort, no crackles, and no wheezes.   Heart:  normal rate, regular rhythm, no murmur, no gallop, and no rub.   Abdomen:  soft,  non-tender, normal bowel sounds, no distention, no guarding.   Msk:  normal ROM.  no joint deformities.   Extremities:  he has an area on the sole of his foot that appears that may represent early bunion, callous Neurologic:  alert & oriented X3.  strength normal in all extremities and gait normal.   Skin:  no rashes and no ecchymoses.  callous with cry skin on foot Psych:  Oriented X3, memory intact for recent and remote, normally interactive,        Medication Adherence: 09/09/2009   Adherence to medications reviewed with patient. Counseling to provide adequate adherence provided   Prevention For Positives: 09/09/2009   Safe sex practices discussed with patient. Condoms offered.   Education Materials Provided: 09/09/2009 Safe sex practices discussed with patient. Condoms offered.                          Impression & Recommendations:  Problem # 1:  HIV DISEASE (ICD-042)  Recheck cd4, viral load today, continue ARV, and OI prophhylaxis and rx for crypto His updated medication list for this problem includes:    Fluconazole 200 Mg Tabs (Fluconazole) .Marland Kitchen... Take two pills  a day until 5/21st then one pill a day    Bactrim Ds 800-160 Mg Tabs (Sulfamethoxazole-trimethoprim) .Marland Kitchen... Take 1 tablet by mouth once a day  Diagnostics Reviewed:  HIV: CDC-defined AIDS (12/17/2008)   CD4: 120 (06/16/2009)   WBC: 6.2 (06/15/2009)   Hgb: 13.6 (06/15/2009)   HCT: 42.2 (06/15/2009)   Platelets: 365 (06/15/2009) HIV-1 RNA: 292 (06/15/2009)   HBSAg: NO (10/01/2006)  Orders: Est. 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  Orders: Est. Patient Level IV (60454)  Problem # 3:  PULMONARY NODULE (ICD-518.89) Assessment: Comment Only  likely crypto but will repat CT 6 months into his treament  Orders: Est. Patient Level IV (09811)  Problem # 4:  CALLUS, LEFT FOOT (ICD-700)  may have a  bunion here too. Check xray of the foot  Orders: Diagnostic X-Ray/Fluoroscopy (Diagnostic X-Ray/Flu) Est. Patient Level IV (91478)  Problem # 5:  METHICILLIN RESISTANT STAPHYLOCOCCUS AUREUS INFECTION (ICD-041.12) sounds like he may have had recurrenc but he would not let me examine him  Problem # 6:  TESTICULAR HYPOFUNCTION (ICD-257.2)  addding low dose testosterone topical, rehceck level while on therapy  Orders: Est. Patient Level IV (29562)  Medications Added to  Medication List This Visit: 1)  Androderm 2.5 Mg/24hr Pt24 (Testosterone) .... Apply to dry skin daily,dispense one month supply  Other Orders: T-RPR (Syphilis) [phone]) T-GC Probe, urine [id number]) T-Chlamydia  Probe, urine [phone]) T-CD4SP [facility]) (CD4SP) T-HIV Viral Load [phone]) T-Comprehensive Metabolic Panel [phone]) T-CBC w/Diff ([id number]) Hepatitis B Vaccine >40yrs (03474) Admin 1st Vaccine (25956) Pneumococcal Vaccine (38756) Admin of Any Addtl Vaccine (43329) Future Orders: T-CD4SP ([facility]) (CD4SP) ... 10/06/2009 T-HIV Viral Load [phone]) ... 10/06/2009 T-CBC w/Diff ([id number]) ... 10/06/2009 T-Comprehensive Metabolic Panel [phone]) ... 10/06/2009 T-Lipid Profile [phone] [phone]) ... 10/06/2009  Patient Instructions: 1)  followup appt in  March    Immunizations Administered:  Hepatitis B Vaccine # 3:    Vaccine Type: HepB Adult    Site: left deltoid    Mfr: Merck    Dose: 0.5 ml    Route: IM    Given by: Starleen Arms CMA    Exp. Date: 07/10/2011    Lot #: [id number]    VIS given: 02/21/06 version given September 09, 2009.  Pneumonia Vaccine:    Vaccine Type: Pneumovax    Site: left deltoid    Mfr: Merck    Dose: 0.5 ml    Route: IM    Given by: Starleen Arms CMA    Exp. Date: 11/25/2010    Lot #: [id number]    VIS given: 03/04/96 version given September 09, 2009. Prescriptions: ANDRODERM 2.5 MG/24HR PT24 (TESTOSTERONE) apply to dry skin  daily,dispense one month supply  #30 x 0   Entered and Authorized by:   Acey Lav MD   Signed by:   Paulette Blanch Dam MD on 09/09/2009   Method used:   Print then Give to Patient   RxID:   [id number] ANDRODERM 2.5 MG/24HR PT24 (TESTOSTERONE) apply to dry skin daily,dispense one month supply  #30 x 0   Entered and Authorized by:   Acey Lav MD   Signed by:   Paulette Blanch Dam MD on 09/09/2009   Method used:   Print then Give to Patient   RxID:   [id number]  Process Orders Check Orders Results:     [facility]: ABN not required for this insurance Tests Sent for requisitioning (September 09, 2009 11:37 PM):     09/09/2009: [facility] -- T-RPR (Syphilis) [phone] (signed)     09/09/2009: [facility] -- T-GC Probe, urine [phone] (signed)     09/09/2009: [facility] -- T-Chlamydia  Probe, urine [phone] (signed)     09/09/2009: [facility] -- T-HIV Viral Load [phone] (signed)     09/09/2009: [facility] -- T-Comprehensive Metabolic Panel [[id number]] (signed)     09/09/2009: [facility] -- T-CBC w/Diff [[id number]] (signed)     10/06/2009: [facility] -- T-HIV Viral Load [phone] (signed)     10/06/2009: [facility] -- T-CBC w/Diff [[id number]] (signed)     10/06/2009: [facility] -- T-Comprehensive Metabolic Panel [[id number]] (signed)     10/06/2009: [facility] -- T-Lipid Profile [phone] (signed)

## 2010-09-06 NOTE — Miscellaneous (Signed)
Summary: [facility]  [facility]   Imported By: Florinda Marker 11/09/2008 14:54:19  _____________________________________________________________________  External Attachment:    Type:   Image     Comment:   External Document

## 2010-09-06 NOTE — Assessment & Plan Note (Signed)
Summary: f/u labs-ty   Visit Type:  Follow-up Referring Provider:  Paulette Blanch Dam Primary Provider:  Paulette Blanch Dam MD  CC:  follow-up visit.  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 He was then  readmitted with actual crytpo meningitis later that Spring. He was having problems with persistent cough and I ordered a CT scan which showed multiple nodules that were also suspicious on PET scan for possible pulmonary malignancy. THe pt was evaluated by Marcelyn Bruins with [facility] and he obtained a CT guided biopsy of lung nodule. Bx result came back negative for malignancy showing granulomatous inflammation. AFB and fungal cultures never grew an organism, and his serum crypto ag was negative. I have been treating him for cryptococcal disease nontheless. When I saw him earlier this month his Viral load was undetectable and his cd4 count was 140. He continues to be closely followed by Cleophus Molt. He has started on testosterone replacemetn patches and calims that he even went running once. He is in good spirits today.  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-293.83) 4)  Methicillin Resistant Staphylococcus Aureus Infection  (ICD-041.12) 5)  Pulmonary Nodule  (ICD-518.89) 6)  Ct, Chest, Abnormal  (ICD-793.1) 7)  Unspecified Tachycardia  (ICD-785.0) 8)  Chest Pain, Atypical  (ICD-786.59) 9)  Unspecified Fall  (ICD-E888.9) 10)  Diarrhea  (ICD-787.91) 11)  Testicular Hypofunction  (ICD-257.2) 12)  Cachexia  (ICD-799.4) 13)  Pneumocystis Pneumonia  (ICD-136.3) 14)  Meningitis, Cryptococcal  (ICD-117.5) 15)  Cough   (ICD-786.2) 16)  Acute Kidney Failure Unspecified, Secondary To Medications  (ICD-584.9) 17)  Drug Abuse  (ICD-305.90) 18)  HIV Disease  (ICD-042) 19)  Hepatitis C  (ICD-070.51)  Medications Prior to Update: 1)  Prezista 400 Mg Tabs (Darunavir Ethanolate) .... Take Two Tablets Once Daily With Norvir and Truvada 2)  Norvir 100 Mg Caps (Ritonavir) .... Take One Capsule Daily With Two Prezista Tablets and One Truvada Tablet 3)  Truvada 200-300 Mg Tabs (Emtricitabine-Tenofovir) .... Take One Tablet With Two Tablerts of Prezista and One Capsule of Norvir Daily 4)  Fluconazole 200 Mg Tabs (Fluconazole) .... Take Two Pills  A Day Until 5/21st Then One Pill A Day 5)  Bactrim Ds 800-160 Mg Tabs (Sulfamethoxazole-Trimethoprim) .... Take 1 Tablet By Mouth Once A Day 6)  Lomotil 2.5-0.025 Mg Tabs (Diphenoxylate-Atropine) .... Take 2 Now and 1-2 Every 6 Hours As Needed Diarrhea 7)  Omeprazole 40 Mg Cpdr (Omeprazole) .... Take 1 Tablet By Mouth Once A Day 8)  Cheratussin Ac 100-10 Mg/30ml Syrp (Guaifenesin-Codeine) .... 5 To 10ml Three Times A Day As Needed Cough, Caution For Sedating Effect 9)  Ensure High Protein  Liqd (Nutritional Supplements) .... Three Times A Day Can 10)  Benadryl 25 Mg Caps (Diphenhydramine Hcl) .... Take 1 Tablet By Mouth Once A Day 11)  Hibiclens 4 % Liqd (Chlorhexidine Gluconate) .... Wash Twice A Day With This Soap For 7 Days, Dispense One Bottle 12)  Bactroban 2 % Crea (Mupirocin Calcium) .... Apply To Inside of Nostrils Twice A Day For 7 Days 13)  Androderm 2.5 Mg/24hr Pt24 (Testosterone) .... Apply To  Dry Skin Daily,dispense One Month Supply  Current Medications (verified): 1)  Prezista 400 Mg Tabs (Darunavir Ethanolate) .... Take Two Tablets Once Daily With Norvir and Truvada 2)  Norvir 100 Mg Caps (Ritonavir) .... Take One Capsule Daily With Two Prezista Tablets and One Truvada Tablet 3)  Truvada 200-300 Mg Tabs (Emtricitabine-Tenofovir) .... Take One Tablet With Two  Tablerts of Prezista and One Capsule of Norvir Daily 4)  Fluconazole 200 Mg Tabs (Fluconazole) .... Take Two Pills  A Day Until 5/21st Then One Pill A Day 5)  Bactrim Ds 800-160 Mg Tabs (Sulfamethoxazole-Trimethoprim) .... Take 1 Tablet By Mouth Once A Day 6)  Lomotil 2.5-0.025 Mg Tabs (Diphenoxylate-Atropine) .... Take 2 Now and 1-2 Every 6 Hours As Needed Diarrhea 7)  Omeprazole 40 Mg Cpdr (Omeprazole) .... Take 1 Tablet By Mouth Once A Day 8)  Cheratussin Ac 100-10 Mg/42ml Syrp (Guaifenesin-Codeine) .... 5 To 10ml Three Times A Day As Needed Cough, Caution For Sedating Effect 9)  Ensure High Protein  Liqd (Nutritional Supplements) .... Three Times A Day Can 10)  Benadryl 25 Mg Caps (Diphenhydramine Hcl) .... Take 1 Tablet By Mouth Once A Day 11)  Hibiclens 4 % Liqd (Chlorhexidine Gluconate) .... Wash Twice A Day With This Soap For 7 Days, Dispense One Bottle 12)  Bactroban 2 % Crea (Mupirocin Calcium) .... Apply To Inside of Nostrils Twice A Day For 7 Days 13)  Androderm 2.5 Mg/24hr Pt24 (Testosterone) .... Apply To Dry Skin Daily,dispense One Month Supply  Allergies (verified): No Known Drug Allergies   Preventive Screening-Counseling & Management  Alcohol-Tobacco     Alcohol drinks/day: 2     Alcohol type: beer     Smoking Status: current     Smoking Cessation Counseling: 04/06/2005     Packs/Day: 0.75  Caffeine-Diet-Exercise     Caffeine use/day: 3  Hep-HIV-STD-Contraception     HIV Risk Counseling: 04/06/2005  Safety-Violence-Falls     Seat Belt Use: yes  Comments: pt declined condoms      Sexual History:  none.        Drug Use:  crack cocaine.        Blood Transfusions:  no.        Travel History:  no.     Current Allergies (reviewed today): No known allergies  Review of Systems  The patient denies anorexia, fever, weight loss, weight gain, vision loss, decreased hearing, hoarseness, chest pain, syncope, dyspnea on exertion, peripheral edema, prolonged cough,  headaches, hemoptysis, abdominal pain, melena, hematochezia, severe indigestion/heartburn, hematuria, incontinence, genital sores, muscle weakness, suspicious skin lesions, transient blindness, difficulty walking, depression, unusual weight change, abnormal bleeding, and enlarged lymph nodes.    Vital Signs:  Patient profile:   55 year old male Height:      65 inches (165.10 cm) Weight:      149.1 pounds (67.77 kg) BMI:     24.90 Temp:     98.0 degrees F oral Pulse rate:   78 / minute BP sitting:   136 / 87  (left arm)  Vitals Entered By: Wendall Mola CMA Duncan Dull) (September 30, 2009 2:03 PM) CC: follow-up visit Is Patient Diabetic? No Pain Assessment Patient in pain? no      Nutritional Status BMI of 19 -24 = normal  Does patient need assistance? Functional Status Self care Ambulation Normal   Physical Exam  General:  alert and underweight.  Head:  wasting of facial muscles, atraumatic.   Eyes:  vision grossly intact, pupils equal,  pupils round, and pupils reactive to light.   Ears:  no external deformities.  ear piercing(s) noted.   Nose:  no external deformity, no external erythema, and no nasal discharge.   Mouth:  no erythema and no exudates.  no thrush Neck:  supple.  full ROM.   Lungs:  normal respiratory effort, no crackles, and no wheezes.   Heart:  normal rate, regular rhythm, no murmur, no gallop, and no rub.   Abdomen:  soft, non-tender, normal bowel sounds, no distention, no guarding.   Msk:  normal ROM.  no joint deformities.   Extremities:  no edema Neurologic:  alert & oriented X3.  strength normal in all extremities and gait normal.   Skin:  no rashes and no ecchymoses.   Psych:  Oriented X3, memory intact for recent and remote, normally interactive,        Medication Adherence: 09/30/2009   Adherence to medications reviewed with patient. Counseling to provide adequate adherence provided   Prevention For Positives: 09/30/2009   Safe sex practices  discussed with patient. Condoms offered.   Education Materials Provided: 09/30/2009 Safe sex practices discussed with patient. Condoms offered.                          Impression & Recommendations:  Problem # 1:  HIV DISEASE (ICD-042)  Excellent control. Check vl and cd4 today and then bring back in 2-3 months His updated medication list for this problem includes:    Fluconazole 200 Mg Tabs (Fluconazole) .Marland Kitchen... Take two pills  a day until 5/21st then one pill a day    Bactrim Ds 800-160 Mg Tabs (Sulfamethoxazole-trimethoprim) .Marland Kitchen... Take 1 tablet by mouth once a day  Diagnostics Reviewed:  HIV: CDC-defined AIDS (12/17/2008)   CD4: 140 (09/10/2009)   WBC: 4.4 (09/09/2009)   Hgb: 13.9 (09/09/2009)   HCT: 42.1 (09/09/2009)   Platelets: 297 (09/09/2009) HIV-1 RNA: <48 copies/mL (09/09/2009)   HBSAg: NO (10/01/2006)  Orders: Est. 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 Patient Level IV (91478)  Problem # 3:  PULMONARY NODULE (ICD-518.89)  presumed to be due to crypto, repeat CT of the chest ordered.  Orders: Est. Patient Level IV (29562)  Problem # 4:  TESTICULAR HYPOFUNCTION (ICD-257.2)  now on testosterone replacment  Orders: Est. Patient Level IV (13086)  Problem # 5:  HEPATITIS C (ICD-070.51)  ref[facility]  Orders: Hepatitis C Clinic Referral (HepC) Est. Patient Level IV (57846)  Other Orders: T-CD4SP ([facility]) (CD4SP) T-HIV Viral Load [phone]) Future Orders: T-CD4SP ([facility]) (CD4SP) ... 11/29/2009 T-HIV Viral Load [phone]) ... 11/29/2009 T-CBC w/Diff ([id number]) ... 11/29/2009 T-Lipid Profile [id number]) ... 11/29/2009 T-Comprehensive Metabolic Panel [phone]) ... 11/29/2009  Patient Instructions: 1)  rtc in 2 months please have lab work done 2 weeks prior to visit 2)  we will check two labs today   Process  Orders Check Orders Results:     [facility]: ABN not required for this insurance Tests Sent for requisitioning (September 30, 2009 10:36 PM):     09/30/2009: [facility] -- T-HIV Viral Load [phone] (signed)     11/29/2009: [facility] -- T-HIV Viral Load [id number] (signed)     11/29/2009: [facility] -- T-CBC w/Diff [[id number]] (signed)     11/29/2009: [facility] -- T-Lipid Profile [phone] (signed)  11/29/2009: [facility] -- T-Comprehensive Metabolic Panel [phone] (signed)

## 2010-09-06 NOTE — Progress Notes (Signed)
Summary: talk to nurse  Phone Note From Other Clinic Call back at [phone]   Caller: Patient Call For: clance Caller: homehealth care provider( mr mcgowan) Call For: clance Summary of Call: calling to see if pt has been sch to have bronch? if so when? pt had pet scan yesterday Initial call taken by: Rickard Patience,  June 02, 2009 8:58 AM  Follow-up for Phone Call        per last OV note KC was going to review PET results and then decide on next step. KC please advise. Thanks. Carron Curie CMA  June 02, 2009 9:40 AM  Follow-up by: Barbaraann Share MD,  June 02, 2009 6:08 PM  Additional Follow-up for Phone Call Additional follow up Details #1::        see addendum under pet scan. Additional Follow-up by: Barbaraann Share MD,  June 02, 2009 6:08 PM

## 2010-09-06 NOTE — Letter (Signed)
Summary: HomeCare Providers: [facility]  HomeCare Providers: [facility]   Imported By: Florinda Marker 02/02/2010 16:47:37  _____________________________________________________________________  External Attachment:    Type:   Image     Comment:   External Document

## 2010-09-06 NOTE — Miscellaneous (Signed)
Summary: HomeCare Providers  HomeCare Providers   Imported By: Florinda Marker 03/31/2010 14:46:44  _____________________________________________________________________  External Attachment:    Type:   Image     Comment:   External Document

## 2010-09-06 NOTE — Assessment & Plan Note (Signed)
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 I saw him this Spring and at that time he was relatively well controlled on ARV. I had wanted to recheck CT scan becaue he persisted in having pulmonary symptoms . He returns for fu today in clinic and has mx complaints. I spent over 45 minutes witht the pt including greater than 50%  of time face to face counselling and coordinatoin of care.   #1 he idepressed because his roomate is incarcerated now and he is not sure if he can keep his housing . He denies SI active or passive or HI.  #2 he endorses persistent flak pain on the left side my "kidney stones" but without radiation to groin  #3 he continues to have productive sputum with clear and sometimes more dark colored phlegm. 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 crypto + IRIS?  Past Surgical History: Last updated: 06/28/2009 CT guided Lung nodule biopsy October 2010  Family History: Last updated: 2009/06/10 Mother passed away in her 4's "internal bleeding" Father is in his 16s, healthy siblings are healthy  Social History: Last updated: 05/16/2010 Live in house afraid of losing this now Drinks 2 beers on average pt is a current smoker. 1 to 2 cigs per week.  started at age 72. Past history of cocaine abuse Used to be a cook. pt  currently  unemployed. pt is single. pt does not have any children.  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) 7)  Methicillin Resistant Staphylococcus Aureus Infection  (ICD-041.12) 8)  Pulmonary Nodule  (ICD-518.89) 9)  Ct, Chest, Abnormal  (ICD-793.1) 10)  Unspecified Tachycardia  (ICD-785.0) 11)  Chest Pain, Atypical  (ICD-786.59) 12)  Unspecified Fall  (ICD-E888.9) 13)  Diarrhea  (ICD-787.91) 14)  Testicular Hypofunction  (ICD-257.2) 15)  Cachexia  (ICD-799.4) 16)  Pneumocystis Pneumonia  (ICD-136.3) 17)  Meningitis, Cryptococcal  (ICD-117.5) 18)  Cough  (ICD-786.2) 19)  Acute Kidney Failure Unspecified, Secondary To Medications  (ICD-584.9) 20)  Drug Abuse  (ICD-305.90) 21)  HIV Disease  (ICD-042) 22)  Hepatitis C  (ICD-070.51)  Medications Prior to Update: 1)  Prezista 400 Mg Tabs (Darunavir Ethanolate) .... Take Two Tablets Once Daily With Norvir and Truvada 2)  Norvir 100 Mg Caps (Ritonavir) .... Take One Capsule Daily With Two Prezista Tablets and One Truvada Tablet 3)  Truvada 200-300 Mg Tabs (Emtricitabine-Tenofovir) .... Take One Tablet With Two Tablerts of Prezista and One Capsule of Norvir Daily 4)  Fluconazole 200 Mg Tabs (Fluconazole) .... Take Two Pills  A Day Until 5/21st Then One Pill A Day 5)  Bactrim Ds 800-160 Mg Tabs (Sulfamethoxazole-Trimethoprim) .... Take 1 Tablet By Mouth Once A Day 6)  Lomotil 2.5-0.025 Mg Tabs (Diphenoxylate-Atropine) .... Take 2 Now and 1-2 Every 6 Hours As Needed Diarrhea 7)  Omeprazole 40 Mg Cpdr (Omeprazole) .... Take 1  Tablet By Mouth Once A Day 8)  Cheratussin Ac 100-10 Mg/15ml Syrp (Guaifenesin-Codeine) .... 5 To 10ml Three Times A Day As Needed Cough, Caution For Sedating Effect 9)  Ensure High Protein  Liqd (Nutritional Supplements) .... Three Times A Day Can 10)  Benadryl 25 Mg Caps (Diphenhydramine Hcl) .... Take 1 Tablet By Mouth Once A Day 11)  Hibiclens 4 % Liqd (Chlorhexidine Gluconate) .... Wash Twice A Day With This Soap For 7 Days, Dispense One Bottle 12)  Bactroban 2 % Crea (Mupirocin Calcium) .... Apply To Inside of Nostrils Twice A Day For 7 Days 13)  Androderm 2.5 Mg/24hr Pt24 (Testosterone) .... Apply To Dry Skin Daily,dispense One Month Supply 14)  Diltiazem Hcl Cr 60 Mg Xr12h-Cap (Diltiazem Hcl) .... E 15)  Vicodin 5-500 Mg Tabs (Hydrocodone-Acetaminophen) .... Take 1 Tablet By Mouth Every 8 Hours Prn  Current Medications (verified): 1)  Prezista 400 Mg Tabs (Darunavir Ethanolate) .... Take Two Tablets Once Daily With Norvir and Truvada 2)  Norvir 100 Mg Caps (Ritonavir) .... Take One Capsule Daily With Two Prezista Tablets and One Truvada Tablet 3)  Truvada 200-300 Mg Tabs (Emtricitabine-Tenofovir) .... Take One Tablet With Two Tablerts of Prezista and One Capsule of Norvir Daily 4)  Fluconazole 200 Mg Tabs (Fluconazole) .... Take Two Pills  A Day Until 5/21st Then One Pill A Day 5)  Bactrim Ds 800-160 Mg Tabs (Sulfamethoxazole-Trimethoprim) .... Take 1 Tablet By Mouth Once A Day 6)  Lomotil 2.5-0.025 Mg Tabs (Diphenoxylate-Atropine) .... Take 2 Now and 1-2 Every 6 Hours As Needed Diarrhea 7)  Omeprazole 40 Mg Cpdr (Omeprazole) .... Take 1 Tablet By Mouth Once A Day 8)  Cheratussin Ac 100-10 Mg/52ml Syrp (Guaifenesin-Codeine) .... 5 To 10ml Three Times A Day As Needed Cough, Caution For Sedating Effect 9)  Ensure High Protein  Liqd (Nutritional Supplements) .... Three Times A Day Can 10)  Benadryl 25 Mg Caps (Diphenhydramine Hcl) .... Take 1 Tablet By Mouth Once A Day 11)  Hibiclens 4 % Liqd  (Chlorhexidine Gluconate) .... Wash Twice A Day With This Soap For 7 Days, Dispense One Bottle 12)  Bactroban 2 % Crea (Mupirocin Calcium) .... Apply To Inside of Nostrils Twice A Day For 7 Days 13)  Androderm 2.5 Mg/24hr Pt24 (Testosterone) .... Apply To Dry Skin Daily,dispense One Month Supply 14)  Diltiazem Hcl Cr 60 Mg Xr12h-Cap (Diltiazem Hcl) .... E 15)  Vicodin 5-500 Mg Tabs (Hydrocodone-Acetaminophen) .... Take 1 Tablet By Mouth Every 8 Hours Prn  Allergies: No Known Drug Allergies   Family History: Reviewed history from 05/27/2009 and no changes required. Mother passed away in her 76's "internal bleeding" Father is in his 75s, healthy siblings are healthy  Social History: Live in house afraid of losing this now Drinks 2 beers on average pt is a current smoker. 1 to 2 cigs per week.  started at age 8. Past history of cocaine abuse Used to be a cook. pt currently  unemployed. pt is single. pt does not have any children.  Review of Systems       The patient complains of fever, prolonged cough, headaches, abdominal pain, and severe indigestion/heartburn.  The patient  denies anorexia, weight loss, weight gain, vision loss, decreased hearing, hoarseness, chest pain, syncope, dyspnea on exertion, peripheral edema, hemoptysis, melena, hematochezia, hematuria, incontinence, genital sores, muscle weakness, suspicious skin lesions, transient blindness, difficulty walking, depression, unusual weight change, abnormal bleeding, and enlarged lymph nodes.    Physical Exam  General:  alert, well hydrated Head:  normocephalic and atraumatic.   Eyes:  vision grossly intact, pupils equal, pupils round, and pupils reactive to light.   Ears:  no external deformities.  ear piercing(s) noted.   Nose:  no external deformity, no external erythema, and no nasal discharge.   Mouth:  pharynx pink and moist.   Neck:  supple.  full ROM.   Lungs:  normal breath sounds.  no crackles and no wheezes.     Heart:  normal rate, regular rhythm, no murmur, no gallop, and no rub.   Abdomen:  soft, non-tender, normal bowel sounds, no distention, no guarding.   Msk:  slight tenderness to palpatio over left cva Extremities:  no edema Neurologic:  alert & oriented X3.  strength normal in all extremities and gait normal.   Skin:  no rashes and no ecchymoses.   Psych:  Oriented X3, memory intact for recent and remote, normally interactive,   Impression & Recommendations:  Problem # 1:  HIV DISEASE (ICD-042)  recheck labs today His updated medication list for this problem includes:    Fluconazole 200 Mg Tabs (Fluconazole) .Marland Kitchen... Take two pills  a day until 5/21st then one pill a day    Bactrim Ds 800-160 Mg Tabs (Sulfamethoxazole-trimethoprim) .Marland Kitchen... Take 1 tablet by mouth once a day  Diagnostics Reviewed:  HIV: CDC-defined AIDS (12/17/2008)   CD4: 240 (01/14/2010)   WBC: 5.1 (01/13/2010)   Hgb: 14.5 (01/13/2010)   HCT: 44.2 (01/13/2010)   Platelets: 318 (01/13/2010) HIV-1 RNA: <48 copies/mL (01/13/2010)   HBSAg: NO (10/01/2006)  Orders: Est. Patient Level V (16109)  Problem # 2:  ABDOMINAL CRAMPS (ICD-789.00)  working up for ID causes of diarrhea. he is also on ppi for gerd  Orders: Est. Patient Level V (60454)  Problem # 3:  FEVER, HX OF (ICD-V15.9)  checking ua and culture given his cva tenderness (though this isi chronic) will get ct scan see belwo  Orders: Est. 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 Patient Level V (91478)  Problem # 5:  PULMONARY NODULE (ICD-518.89) he had lesions highly suspicous for cancer but bx showed granulomatous inflammtion. will recheck ct scan chest  Problem # 6:  DIARRHEA (ICD-787.91) working up for ID causes. Shcouldhave also checked afb blood culture His updated medication list for this problem  includes:    Bactrim Ds 800-160 Mg Tabs (Sulfamethoxazole-trimethoprim) .Marland Kitchen... Take 1 tablet by mouth once a day    Lomotil 2.5-0.025 Mg Tabs (Diphenoxylate-atropine) .Marland Kitchen... Take 2 now and 1-2 every 6 hours as needed diarrhea  Orders: T-Stool Giardia / Crypto- EIA (29562) T-Stool Culture (13086) T-Stool for O&P ([id number]) T- * Misc. Laboratory test [phone]) T- * Misc. Laboratory test [phone]) T- * Misc. Laboratory test ([phone]) Est. Patient Level V [phone])  Other Orders: T-CD4SP [facility]) (CD4SP) T-HIV Viral Load [phone]) T-Comprehensive Metabolic Panel [phone]) T-CBC w/Diff [phone]) T-RPR (Syphilis) [phone]) CT with Contrast (CT w/ contrast) T-Urinalysis Dipstick only (35573UK) T-Culture, Urine ([id number])       Medication Adherence: 05/16/2010  Adherence to medications reviewed with patient. Counseling to provide adequate adherence provided   Prevention For Positives: 05/16/2010   Safe sex practices discussed with patient. Condoms offered.   Education Materials Provided: 05/16/2010 Safe sex practices discussed with patient. Condoms offered.                            Patient Instructions: 1)  Please schedule a follow-up appointment in 4 weeks  Laboratory Results   Urine Tests  Date/Time Received: Mariea Clonts  May 16, 2010 11:16 AM  Date/Time Reported: Mariea Clonts  May 16, 2010 11:16 AM   Routine Urinalysis   Color: yellow Appearance: Cloudy Glucose: negative   (Normal Range: Negative) Bilirubin: small   (Normal Range: Negative) Ketone: negative   (Normal Range: Negative) Spec. Gravity: >=1.030   (Normal Range: 1.003-1.035) Blood: negative   (Normal Range: Negative) pH: 6.0   (Normal Range: 5.0-8.0) Protein: 100   (Normal Range: Negative) Urobilinogen: 1.0   (Normal Range: 0-1) Nitrite: negative   (Normal Range: Negative) Leukocyte Esterace: negative   (Normal Range: Negative)

## 2010-09-06 NOTE — Progress Notes (Signed)
Summary: [facility] w/ Dr. Daiva Eves, referal to Atilano Median  Phone Note Outgoing Call   Call placed by: Jennet Maduro RN,  October 16, 2008 10:32 AM Call placed to: Atilano Median, Bridge Counselor Action Taken: Advertising account executive of Call: Pt. DNKA appt. with Dr. Daiva Eves 10/14/08.  RN spoke with Atilano Median, Bridge Counselor who has been visiting the pt. at home.  Gave counselor a two times a day, weekly pill box for the pt. and reminded her that the pt. needs to make a f/u appt. this month with Dr. Daiva Eves.  Ms. Tenny Craw stated that she would visit the pt. today, go over his filling of his pill box and remind the pt. to make and keep an appt. with Dr. Daiva Eves. Jennet Maduro RN  October 16, 2008 10:36 AM

## 2010-09-06 NOTE — Miscellaneous (Signed)
Summary: HomeCare Providers  HomeCare Providers   Imported By: Florinda Marker 03/15/2009 14:15:39  _____________________________________________________________________  External Attachment:    Type:   Image     Comment:   External Document

## 2010-09-06 NOTE — Assessment & Plan Note (Signed)
Summary: 2 MONTH F/U/VS   Visit Type:  Follow-up Referring Provider:  Paulette Blanch Dam Primary Provider:  Paulette Blanch Dam MD  CC:  cough and leg cramps.  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 He was then  readmitted with actual crytpo meningitis later that Spring. He was having problems with persistent cough and I ordered a CT scan which showed multiple nodules that were also suspicious on PET scan for possible pulmonary malignancy. THe pt was evaluated by Marcelyn Bruins with [facility] and he obtained a CT guided biopsy of lung nodule. Bx result came back negative for malignancy showing granulomatous inflammation. AFB and fungal cultures never grew an organism, and his serum crypto ag was negative. I have been treating him for cryptococcal disease nontheless. When I saw him earlier this month his Viral load was undetectable and his cd4 count was 140. The pt has done and excellent job maintaining perfect virological suppresion with VL less than 48 on last three check and cd4 now up to 170. He still is  still c/o coughing a fair amt, early in the am and sometimes at night with some orthopnea. He is bringin up thick white phlegm. I asked him whether he was taking the fluconaZOLE and he stated that he thought he was but he did not recognize the name of the medication. Pt i salso c/o leg cramping that lasts 15 minutes. It happens at night while he is laying down. It has been going on the last few weeks. He does not have this symptom during the day. 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-293.83) 4)  Methicillin Resistant Staphylococcus Aureus Infection  (ICD-041.12) 5)  Pulmonary Nodule  (ICD-518.89) 6)  Ct, Chest, Abnormal  (ICD-793.1) 7)  Unspecified Tachycardia  (ICD-785.0) 8)  Chest Pain, Atypical  (ICD-786.59) 9)  Unspecified Fall  (ICD-E888.9) 10)  Diarrhea  (ICD-787.91) 11)  Testicular Hypofunction  (ICD-257.2) 12)  Cachexia  (ICD-799.4) 13)  Pneumocystis Pneumonia  (ICD-136.3) 14)  Meningitis, Cryptococcal  (ICD-117.5) 15)  Cough  (ICD-786.2) 16)  Acute Kidney Failure Unspecified, Secondary To Medications  (ICD-584.9) 17)  Drug Abuse  (ICD-305.90) 18)  HIV Disease  (ICD-042) 19)  Hepatitis C  (ICD-070.51)  Medications Prior to Update: 1)  Prezista 400 Mg Tabs (Darunavir Ethanolate) .... Take Two Tablets Once Daily With Norvir and Truvada 2)  Norvir 100 Mg Caps (Ritonavir) .... Take One Capsule Daily With Two Prezista Tablets and One Truvada Tablet 3)  Truvada 200-300 Mg Tabs (Emtricitabine-Tenofovir) .... Take One Tablet With Two Tablerts of Prezista and One Capsule of Norvir Daily 4)  Fluconazole 200 Mg Tabs (Fluconazole) .... Take Two Pills  A Day Until 5/21st Then One Pill A Day 5)  Bactrim Ds 800-160 Mg Tabs (Sulfamethoxazole-Trimethoprim) .... Take 1 Tablet By Mouth Once A Day 6)  Lomotil 2.5-0.025 Mg Tabs (Diphenoxylate-Atropine) .... Take 2 Now and 1-2 Every 6 Hours As Needed Diarrhea 7)  Omeprazole 40 Mg Cpdr (Omeprazole) .... Take 1 Tablet By Mouth Once A Day 8)  Cheratussin Ac 100-10 Mg/73ml  Syrp (Guaifenesin-Codeine) .... 5 To 10ml Three Times A Day As Needed Cough, Caution For Sedating Effect 9)  Ensure High Protein  Liqd (Nutritional Supplements) .... Three Times A Day Can 10)  Benadryl 25 Mg Caps (Diphenhydramine Hcl) .... Take 1 Tablet By Mouth Once A Day 11)  Hibiclens 4 % Liqd (Chlorhexidine Gluconate) .... Wash Twice A Day With This Soap For 7 Days, Dispense One Bottle 12)  Bactroban 2 % Crea (Mupirocin Calcium) .... Apply To Inside of  Nostrils Twice A Day For 7 Days 13)  Androderm 2.5 Mg/24hr Pt24 (Testosterone) .... Apply To Dry Skin Daily,dispense One Month Supply  Current Medications (verified): 1)  Prezista 400 Mg Tabs (Darunavir Ethanolate) .... Take Two Tablets Once Daily With Norvir and Truvada 2)  Norvir 100 Mg Caps (Ritonavir) .... Take One Capsule Daily With Two Prezista Tablets and One Truvada Tablet 3)  Truvada 200-300 Mg Tabs (Emtricitabine-Tenofovir) .... Take One Tablet With Two Tablerts of Prezista and One Capsule of Norvir Daily 4)  Fluconazole 200 Mg Tabs (Fluconazole) .... Take Two Pills  A Day Until 5/21st Then One Pill A Day 5)  Bactrim Ds 800-160 Mg Tabs (Sulfamethoxazole-Trimethoprim) .... Take 1 Tablet By Mouth Once A Day 6)  Lomotil 2.5-0.025 Mg Tabs (Diphenoxylate-Atropine) .... Take 2 Now and 1-2 Every 6 Hours As Needed Diarrhea 7)  Omeprazole 40 Mg Cpdr (Omeprazole) .... Take 1 Tablet By Mouth Once A Day 8)  Cheratussin Ac 100-10 Mg/59ml Syrp (Guaifenesin-Codeine) .... 5 To 10ml Three Times A Day As Needed Cough, Caution For Sedating Effect 9)  Ensure High Protein  Liqd (Nutritional Supplements) .... Three Times A Day Can 10)  Benadryl 25 Mg Caps (Diphenhydramine Hcl) .... Take 1 Tablet By Mouth Once A Day 11)  Hibiclens 4 % Liqd (Chlorhexidine Gluconate) .... Wash Twice A Day With This Soap For 7 Days, Dispense One Bottle 12)  Bactroban 2 % Crea (Mupirocin Calcium) .... Apply To Inside of Nostrils Twice A Day For 7 Days 13)  Androderm 2.5 Mg/24hr Pt24 (Testosterone) .... Apply To Dry Skin Daily,dispense One Month Supply 14)  Diltiazem Hcl Cr 60 Mg Xr12h-Cap (Diltiazem Hcl) .... E 15)  Proventil Hfa 108 (90 Base) Mcg/act Aers (Albuterol Sulfate) .... Take Two Puffs Every 6 Hours As Needed  Allergies (verified): No Known Drug Allergies   Preventive Screening-Counseling & Management  Alcohol-Tobacco     Alcohol drinks/day: 2     Alcohol type: beer     Smoking Status: current     Smoking  Cessation Counseling: 04/06/2005     Packs/Day: 0.75   Current Allergies (reviewed today): No known allergies  Family History: Reviewed history from 05/27/2009 and no changes required. Mother passed away in her 31's "internal bleeding" Father is in his 81s, healthy siblings are healthy  Social History: Reviewed history from 05/27/2009 and no changes required. Lives with a friend in house. Drinks 2 beers on average pt is a current smoker. 1 to 2 cigs per week.  started at age 68. Past history of cocaine abuse Used to be a cook. pt currently  unemployed. pt is single. pt does not have any children.  Review of Systems       The patient complains of dyspnea on exertion and prolonged cough.  The patient denies anorexia, fever, weight loss, weight gain, decreased hearing, hoarseness, chest pain, syncope, headaches, hemoptysis, abdominal pain, melena, hematochezia, severe indigestion/heartburn, hematuria, incontinence, genital sores, muscle weakness, suspicious skin lesions, transient blindness, difficulty walking, depression, unusual weight change,  abnormal bleeding, and enlarged lymph nodes.    Vital Signs:  Patient profile:   55 year old male Height:      65 inches (165.10 cm) Weight:      146 pounds (66.36 kg) BMI:     24.38 Temp:     98.0 degrees F (36.67 degrees C) oral Pulse rate:   82 / minute BP sitting:   144 / 90  (left arm)  Vitals Entered By: Starleen Arms CMA (Dec 13, 2009 9:35 AM) CC: cough, leg cramps Is Patient Diabetic? No Pain Assessment Patient in pain? no      Nutritional Status BMI of 25 - 29 = overweight Nutritional Status Detail nl  Does patient need assistance? Functional Status Self care Ambulation Normal   Physical Exam  General:  alert and underweight.  Head:  wasting of facial muscles, atraumatic.   Eyes:  vision grossly intact, pupils equal, pupils round, and pupils reactive to light.   Ears:  no external deformities.  ear piercing(s)  noted.   Nose:  no external deformity, no external erythema, and no nasal discharge.   Mouth:  no erythema and no exudates.  no thrush Neck:  supple.  full ROM.   Lungs:  normal respiratory effort, no crackles, and no wheezes.   Heart:  normal rate, regular rhythm, no murmur, no gallop, and no rub.   Abdomen:  soft, non-tender, normal bowel sounds, no distention, no guarding.   Msk:  normal ROM.  no joint deformities.   Extremities:  no edema Neurologic:  alert & oriented X3.  strength normal in all extremities and gait normal.   Skin:  no rashes and no ecchymoses.   Psych:  Oriented X3, memory intact for recent and remote, normally interactive,        Medication Adherence: 12/13/2009   Adherence to medications reviewed with patient. Counseling to provide adequate adherence provided   Prevention For Positives: 12/13/2009   Safe sex practices discussed with patient. Condoms offered.   Education Materials Provided: 12/13/2009 Safe sex practices discussed with patient. Condoms offered.                          Impression & Recommendations:  Problem # 1:  HIV DISEASE (ICD-042)  Excellent control!!! His updated medication list for this problem includes:    Fluconazole 200 Mg Tabs (Fluconazole) .Marland Kitchen... Take two pills  a day until 5/21st then one pill a day    Bactrim Ds 800-160 Mg Tabs (Sulfamethoxazole-trimethoprim) .Marland Kitchen... Take 1 tablet by mouth once a day  Diagnostics Reviewed:  HIV: CDC-defined AIDS (12/17/2008)   CD4: 170 (12/07/2009)   WBC: 6.1 (12/06/2009)   Hgb: 14.5 (12/06/2009)   HCT: 45.5 (12/06/2009)   Platelets: 303 (12/06/2009) HIV-1 RNA: <48 copies/mL (12/06/2009)   HBSAg: NO (10/01/2006)  Orders: Est. 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 WOuld like him to bring his meds with him.  Orders: CT with Contrast (CT w/ contrast) Est. Patient Level V (53614)  Problem # 3:  CT, CHEST, ABNORMAL (ICD-793.1)  I  would like to go ahead and repeta this given we are now 8 months out from original CT and he still has cough. Just want to make sure he doesnt have 2 things going on in his lungs rather than one  Orders: CT with Contrast (CT w/ contrast) Est. Patient Level V (43154)  Problem # 4:  PULMONARY NODULE (  ICD-518.89)  see above  Orders: Est. Patient Level V (82956)  Problem # 5:  TESTICULAR HYPOFUNCTION (ICD-257.2) continue his testosterone replacement  Problem # 6:  LEG CRAMPS (ICD-729.82) Assessment: New  I am going to start him on diltiazem at night to see if this will help.  Orders: Est. Patient Level V (21308)  Problem # 7:  HEPATITIS C (ICD-070.51)  refer to [facility] hepatology clnic  Orders: Est. Patient Level V (65784)  Problem # 8:  ESSENTIAL HYPERTENSION (ICD-401.9) Assessment: New  diltiazem for his leg cramps may help for this though he would need a two times a day dose or larger extended release 24 dose His updated medication list for this problem includes:    Diltiazem Hcl Cr 60 Mg Xr12h-cap (Diltiazem hcl) ..... E  Orders: Est. Patient Level V (69629)  Problem # 9:  COUGH (ICD-786.2) will give him a MDI today given his persistent cough and chance of his having RAD component His updated medication list for this problem includes:    Bactrim Ds 800-160 Mg Tabs (Sulfamethoxazole-trimethoprim) .Marland Kitchen... Take 1 tablet by mouth once a day    Omeprazole 40 Mg Cpdr (Omeprazole) .Marland Kitchen... Take 1 tablet by mouth once a day    Cheratussin Ac 100-10 Mg/37ml Syrp (Guaifenesin-codeine) .Marland KitchenMarland KitchenMarland KitchenMarland Kitchen 5 to 10ml three times a day as needed cough, caution for sedating effect    Proventil Hfa 108 (90 Base) Mcg/act Aers (Albuterol sulfate) .Marland Kitchen... Take two puffs every 6 hours as needed  Medications Added to Medication List This Visit: 1)  Diltiazem Hcl Cr 60 Mg Xr12h-cap (Diltiazem hcl) .... E 2)  Proventil Hfa 108 (90 Base) Mcg/act Aers (Albuterol sulfate) .... Take two puffs every 6 hours as  needed  Other Orders: Future Orders: T-CD4SP ([facility]) (CD4SP) ... 01/06/2010 T-HIV Viral Load [phone]) ... 01/06/2010 T-CBC w/Diff ([id number]) ... 01/06/2010 T-Comprehensive Metabolic Panel [phone]) ... 01/06/2010  Patient Instructions: 1)  rtc in one month for blood work and then appt with Dr. Daiva Eves   Prescriptions: PROVENTIL HFA 108 (90 BASE) MCG/ACT AERS (ALBUTEROL SULFATE) take two puffs every 6 hours as needed  #1 x 4   Entered and Authorized by:   Acey Lav MD   Signed by:   Paulette Blanch Dam MD on 12/13/2009   Method used:   Print then Give to Patient   RxID:   [id number] DILTIAZEM HCL CR 60 MG XR12H-CAP (DILTIAZEM HCL) e  #30 x 5   Entered and Authorized by:   Acey Lav MD   Signed by:   Paulette Blanch Dam MD on 12/13/2009   Method used:   Print then Give to Patient   RxID:   [id number]   Prevention & Chronic Care Immunizations   Influenza vaccine: Fluvax 3+  (06/28/2009)    Tetanus booster: Not documented    Pneumococcal vaccine: Pneumovax  (09/09/2009)  Colorectal Screening   Hemoccult: Not documented    Colonoscopy: Not documented  Other Screening   PSA: Not documented   Smoking status: current  (12/13/2009)   Smoking cessation counseling: 04/06/2005  (12/13/2009)  Lipids   Total Cholesterol: 102  (12/06/2009)   LDL: 22  (12/06/2009)   LDL Direct: Not documented   HDL: 42  (12/06/2009)   Triglycerides: 191  (12/06/2009)  Hypertension   Last Blood Pressure: 144 / 90  (12/13/2009)   Serum creatinine: 1.01  (12/06/2009)   Serum potassium 4.3  (12/06/2009) CMP ordered   Self-Management Support :    Hypertension self-management support:  Not documented   Nursing Instructions: Give tetanus booster today

## 2010-09-06 NOTE — Miscellaneous (Signed)
Summary: HIPAA Restrictions  HIPAA Restrictions   Imported By: Florinda Marker 08/26/2008 16:02:16  _____________________________________________________________________  External Attachment:    Type:   Image     Comment:   External Document

## 2010-10-19 ENCOUNTER — Other Ambulatory Visit: Payer: Self-pay

## 2010-10-20 LAB — T-HELPER CELL (CD4) - (RCID CLINIC ONLY)
CD4 % Helper T Cell: 12 % — ABNORMAL LOW (ref 33–55)
CD4 T Cell Abs: 190 uL — ABNORMAL LOW (ref 400–2700)

## 2010-10-24 LAB — T-HELPER CELL (CD4) - (RCID CLINIC ONLY)
CD4 % Helper T Cell: 10 % — ABNORMAL LOW (ref 33–55)
CD4 T Cell Abs: 240 uL — ABNORMAL LOW (ref 400–2700)

## 2010-10-25 LAB — T-HELPER CELL (CD4) - (RCID CLINIC ONLY)
CD4 % Helper T Cell: 10 % — ABNORMAL LOW (ref 33–55)
CD4 T Cell Abs: 170 uL — ABNORMAL LOW (ref 400–2700)

## 2010-10-28 LAB — T-HELPER CELL (CD4) - (RCID CLINIC ONLY)
CD4 % Helper T Cell: 11 % — ABNORMAL LOW (ref 33–55)
CD4 % Helper T Cell: 12 % — ABNORMAL LOW (ref 33–55)
CD4 T Cell Abs: 140 uL — ABNORMAL LOW (ref 400–2700)
CD4 T Cell Abs: 150 uL — ABNORMAL LOW (ref 400–2700)

## 2010-11-02 ENCOUNTER — Ambulatory Visit: Payer: Self-pay | Admitting: Infectious Disease

## 2010-11-09 LAB — CBC
HCT: 40.7 % (ref 39.0–52.0)
Hemoglobin: 13.8 g/dL (ref 13.0–17.0)
MCHC: 34 g/dL (ref 30.0–36.0)
MCV: 90.7 fL (ref 78.0–100.0)
Platelets: 328 10*3/uL (ref 150–400)
RBC: 4.48 MIL/uL (ref 4.22–5.81)
RDW: 14.6 % (ref 11.5–15.5)
WBC: 5.4 10*3/uL (ref 4.0–10.5)

## 2010-11-09 LAB — T-HELPER CELL (CD4) - (RCID CLINIC ONLY)
CD4 % Helper T Cell: 9 % — ABNORMAL LOW (ref 33–55)
CD4 T Cell Abs: 120 uL — ABNORMAL LOW (ref 400–2700)

## 2010-11-09 LAB — ANAEROBIC CULTURE: Gram Stain: NONE SEEN

## 2010-11-09 LAB — APTT: aPTT: 28 seconds (ref 24–37)

## 2010-11-09 LAB — PROTIME-INR
INR: 1.03 (ref 0.00–1.49)
Prothrombin Time: 13.4 seconds (ref 11.6–15.2)

## 2010-11-09 LAB — TISSUE CULTURE
Culture: NO GROWTH
Gram Stain: NONE SEEN

## 2010-11-09 LAB — FUNGUS CULTURE W SMEAR: Fungal Smear: NONE SEEN

## 2010-11-09 LAB — AFB CULTURE WITH SMEAR (NOT AT ARMC): Acid Fast Smear: NONE SEEN

## 2010-11-10 LAB — GLUCOSE, CAPILLARY: Glucose-Capillary: 83 mg/dL (ref 70–99)

## 2010-11-11 LAB — T-HELPER CELL (CD4) - (RCID CLINIC ONLY)
CD4 % Helper T Cell: 10 % — ABNORMAL LOW (ref 33–55)
CD4 T Cell Abs: 120 uL — ABNORMAL LOW (ref 400–2700)

## 2010-11-14 LAB — T-HELPER CELL (CD4) - (RCID CLINIC ONLY)
CD4 % Helper T Cell: 8 % — ABNORMAL LOW (ref 33–55)
CD4 % Helper T Cell: 9 % — ABNORMAL LOW (ref 33–55)
CD4 T Cell Abs: 110 uL — ABNORMAL LOW (ref 400–2700)
CD4 T Cell Abs: 120 uL — ABNORMAL LOW (ref 400–2700)

## 2010-11-15 ENCOUNTER — Other Ambulatory Visit: Payer: Self-pay | Admitting: *Deleted

## 2010-11-15 DIAGNOSIS — B20 Human immunodeficiency virus [HIV] disease: Secondary | ICD-10-CM

## 2010-11-15 LAB — T-HELPER CELL (CD4) - (RCID CLINIC ONLY)
CD4 % Helper T Cell: 10 % — ABNORMAL LOW (ref 33–55)
CD4 T Cell Abs: 190 uL — ABNORMAL LOW (ref 400–2700)

## 2010-11-15 MED ORDER — FLUCONAZOLE 200 MG PO TABS: 200.0000 mg | ORAL_TABLET | Freq: Every day | ORAL | Status: DC

## 2010-11-17 ENCOUNTER — Other Ambulatory Visit: Payer: Self-pay | Admitting: *Deleted

## 2010-11-17 DIAGNOSIS — B20 Human immunodeficiency virus [HIV] disease: Secondary | ICD-10-CM

## 2010-11-17 LAB — CULTURE, BLOOD (ROUTINE X 2)
Culture: NO GROWTH
Culture: NO GROWTH

## 2010-11-17 LAB — COMPREHENSIVE METABOLIC PANEL
ALT: 26 U/L (ref 0–53)
AST: 25 U/L (ref 0–37)
Albumin: 3.2 g/dL — ABNORMAL LOW (ref 3.5–5.2)
Alkaline Phosphatase: 102 U/L (ref 39–117)
BUN: 11 mg/dL (ref 6–23)
CO2: 22 mEq/L (ref 19–32)
Calcium: 9.1 mg/dL (ref 8.4–10.5)
Chloride: 97 mEq/L (ref 96–112)
Creatinine, Ser: 1.03 mg/dL (ref 0.4–1.5)
GFR calc Af Amer: >60 mL/min (ref >60)
GFR calc non Af Amer: >60 mL/min (ref >60)
Glucose, Bld: 74 mg/dL (ref 70–99)
Potassium: 3.9 mEq/L (ref 3.5–5.1)
Sodium: 128 mEq/L — ABNORMAL LOW (ref 135–145)
Total Bilirubin: 0.7 mg/dL (ref 0.3–1.2)
Total Protein: 8.6 g/dL — ABNORMAL HIGH (ref 6.0–8.3)

## 2010-11-17 LAB — URINALYSIS, ROUTINE W REFLEX MICROSCOPIC
Bilirubin Urine: NEGATIVE
Glucose, UA: NEGATIVE mg/dL
Hgb urine dipstick: NEGATIVE
Ketones, ur: NEGATIVE mg/dL
Leukocytes, UA: NEGATIVE
Nitrite: NEGATIVE
Protein, ur: 30 mg/dL — AB
Specific Gravity, Urine: 1.03 (ref 1.005–1.030)
Urobilinogen, UA: 1 mg/dL (ref 0.0–1.0)
pH: 6 (ref 5.0–8.0)

## 2010-11-17 LAB — DIFFERENTIAL
Basophils Absolute: 0.1 10*3/uL (ref 0.0–0.1)
Basophils Relative: 1 % (ref 0–1)
Eosinophils Absolute: 0.1 10*3/uL (ref 0.0–0.7)
Eosinophils Relative: 1 % (ref 0–5)
Lymphocytes Relative: 23 % (ref 12–46)
Lymphs Abs: 1.4 10*3/uL (ref 0.7–4.0)
Monocytes Absolute: 0.6 10*3/uL (ref 0.1–1.0)
Monocytes Relative: 10 % (ref 3–12)
Neutro Abs: 4 10*3/uL (ref 1.7–7.7)
Neutrophils Relative %: 65 % (ref 43–77)

## 2010-11-17 LAB — RAPID URINE DRUG SCREEN, HOSP PERFORMED
Amphetamines: NOT DETECTED
Barbiturates: NOT DETECTED
Benzodiazepines: NOT DETECTED
Cocaine: POSITIVE — AB
Opiates: NOT DETECTED
Tetrahydrocannabinol: NOT DETECTED

## 2010-11-17 LAB — CSF CULTURE

## 2010-11-17 LAB — POCT I-STAT 3, ART BLOOD GAS (G3+)
Acid-base deficit: 4 mmol/L — ABNORMAL HIGH (ref 0.0–2.0)
Bicarbonate: 19.1 mEq/L — ABNORMAL LOW (ref 20.0–24.0)
O2 Saturation: 97 %
Patient temperature: 99
TCO2: 20 mmol/L (ref 0–100)
pCO2 arterial: 27.5 mmHg — ABNORMAL LOW (ref 35.0–45.0)
pH, Arterial: 7.45 (ref 7.350–7.450)
pO2, Arterial: 85 mmHg (ref 80.0–100.0)

## 2010-11-17 LAB — CBC
HCT: 26.6 % — ABNORMAL LOW (ref 39.0–52.0)
HCT: 26.8 % — ABNORMAL LOW (ref 39.0–52.0)
HCT: 27 % — ABNORMAL LOW (ref 39.0–52.0)
HCT: 28.3 % — ABNORMAL LOW (ref 39.0–52.0)
HCT: 28.4 % — ABNORMAL LOW (ref 39.0–52.0)
HCT: 29.4 % — ABNORMAL LOW (ref 39.0–52.0)
HCT: 37.1 % — ABNORMAL LOW (ref 39.0–52.0)
Hemoglobin: 10 g/dL — ABNORMAL LOW (ref 13.0–17.0)
Hemoglobin: 9.1 g/dL — ABNORMAL LOW (ref 13.0–17.0)
Hemoglobin: 9.3 g/dL — ABNORMAL LOW (ref 13.0–17.0)
Hemoglobin: 9.4 g/dL — ABNORMAL LOW (ref 13.0–17.0)
Hemoglobin: 9.8 g/dL — ABNORMAL LOW (ref 13.0–17.0)
Hemoglobin: 9.9 g/dL — ABNORMAL LOW (ref 13.0–17.0)
Hemoglobin: 12.6 g/dL — ABNORMAL LOW (ref 13.0–17.0)
MCHC: 33.8 g/dL (ref 30.0–36.0)
MCHC: 34.3 g/dL (ref 30.0–36.0)
MCHC: 34.4 g/dL (ref 30.0–36.0)
MCHC: 34.5 g/dL (ref 30.0–36.0)
MCHC: 35 g/dL (ref 30.0–36.0)
MCHC: 35 g/dL (ref 30.0–36.0)
MCHC: 33.9 g/dL (ref 30.0–36.0)
MCV: 87.5 fL (ref 78.0–100.0)
MCV: 87.8 fL (ref 78.0–100.0)
MCV: 88.8 fL (ref 78.0–100.0)
MCV: 89.1 fL (ref 78.0–100.0)
MCV: 89.1 fL (ref 78.0–100.0)
MCV: 89.9 fL (ref 78.0–100.0)
MCV: 89.5 fL (ref 78.0–100.0)
Platelets: 226 10*3/uL (ref 150–400)
Platelets: 234 10*3/uL (ref 150–400)
Platelets: 271 10*3/uL (ref 150–400)
Platelets: 294 10*3/uL (ref 150–400)
Platelets: 338 10*3/uL (ref 150–400)
Platelets: 402 10*3/uL — ABNORMAL HIGH (ref 150–400)
Platelets: 269 10*3/uL (ref 150–400)
RBC: 2.98 MIL/uL — ABNORMAL LOW (ref 4.22–5.81)
RBC: 3.03 MIL/uL — ABNORMAL LOW (ref 4.22–5.81)
RBC: 3.05 MIL/uL — ABNORMAL LOW (ref 4.22–5.81)
RBC: 3.18 MIL/uL — ABNORMAL LOW (ref 4.22–5.81)
RBC: 3.24 MIL/uL — ABNORMAL LOW (ref 4.22–5.81)
RBC: 3.27 MIL/uL — ABNORMAL LOW (ref 4.22–5.81)
RBC: 4.15 MIL/uL — ABNORMAL LOW (ref 4.22–5.81)
RDW: 18.4 % — ABNORMAL HIGH (ref 11.5–15.5)
RDW: 18.5 % — ABNORMAL HIGH (ref 11.5–15.5)
RDW: 18.6 % — ABNORMAL HIGH (ref 11.5–15.5)
RDW: 18.6 % — ABNORMAL HIGH (ref 11.5–15.5)
RDW: 19.3 % — ABNORMAL HIGH (ref 11.5–15.5)
RDW: 19.6 % — ABNORMAL HIGH (ref 11.5–15.5)
RDW: 18.9 % — ABNORMAL HIGH (ref 11.5–15.5)
WBC: 4.3 10*3/uL (ref 4.0–10.5)
WBC: 4.7 10*3/uL (ref 4.0–10.5)
WBC: 4.8 10*3/uL (ref 4.0–10.5)
WBC: 5.5 10*3/uL (ref 4.0–10.5)
WBC: 5.6 10*3/uL (ref 4.0–10.5)
WBC: 6.1 10*3/uL (ref 4.0–10.5)
WBC: 6.2 10*3/uL (ref 4.0–10.5)

## 2010-11-17 LAB — BASIC METABOLIC PANEL
BUN: 10 mg/dL (ref 6–23)
BUN: 10 mg/dL (ref 6–23)
BUN: 6 mg/dL (ref 6–23)
BUN: 7 mg/dL (ref 6–23)
BUN: 9 mg/dL (ref 6–23)
BUN: 9 mg/dL (ref 6–23)
CO2: 20 mEq/L (ref 19–32)
CO2: 20 mEq/L (ref 19–32)
CO2: 21 mEq/L (ref 19–32)
CO2: 21 mEq/L (ref 19–32)
CO2: 22 mEq/L (ref 19–32)
CO2: 24 mEq/L (ref 19–32)
Calcium: 8.3 mg/dL — ABNORMAL LOW (ref 8.4–10.5)
Calcium: 8.5 mg/dL (ref 8.4–10.5)
Calcium: 8.5 mg/dL (ref 8.4–10.5)
Calcium: 8.8 mg/dL (ref 8.4–10.5)
Calcium: 9 mg/dL (ref 8.4–10.5)
Calcium: 9.2 mg/dL (ref 8.4–10.5)
Chloride: 100 mEq/L (ref 96–112)
Chloride: 101 mEq/L (ref 96–112)
Chloride: 102 mEq/L (ref 96–112)
Chloride: 104 mEq/L (ref 96–112)
Chloride: 107 mEq/L (ref 96–112)
Chloride: 108 mEq/L (ref 96–112)
Creatinine, Ser: 0.73 mg/dL (ref 0.4–1.5)
Creatinine, Ser: 0.77 mg/dL (ref 0.4–1.5)
Creatinine, Ser: 0.79 mg/dL (ref 0.4–1.5)
Creatinine, Ser: 0.8 mg/dL (ref 0.4–1.5)
Creatinine, Ser: 0.85 mg/dL (ref 0.4–1.5)
Creatinine, Ser: 0.95 mg/dL (ref 0.4–1.5)
GFR calc Af Amer: >60 mL/min (ref >60)
GFR calc Af Amer: >60 mL/min (ref >60)
GFR calc Af Amer: >60 mL/min (ref >60)
GFR calc Af Amer: >60 mL/min (ref >60)
GFR calc Af Amer: >60 mL/min (ref >60)
GFR calc Af Amer: >60 mL/min (ref >60)
GFR calc non Af Amer: >60 mL/min (ref >60)
GFR calc non Af Amer: >60 mL/min (ref >60)
GFR calc non Af Amer: >60 mL/min (ref >60)
GFR calc non Af Amer: >60 mL/min (ref >60)
GFR calc non Af Amer: >60 mL/min (ref >60)
GFR calc non Af Amer: >60 mL/min (ref >60)
Glucose, Bld: 105 mg/dL — ABNORMAL HIGH (ref 70–99)
Glucose, Bld: 109 mg/dL — ABNORMAL HIGH (ref 70–99)
Glucose, Bld: 110 mg/dL — ABNORMAL HIGH (ref 70–99)
Glucose, Bld: 127 mg/dL — ABNORMAL HIGH (ref 70–99)
Glucose, Bld: 76 mg/dL (ref 70–99)
Glucose, Bld: 88 mg/dL (ref 70–99)
Potassium: 3.6 mEq/L (ref 3.5–5.1)
Potassium: 4 mEq/L (ref 3.5–5.1)
Potassium: 4.1 mEq/L (ref 3.5–5.1)
Potassium: 4.3 mEq/L (ref 3.5–5.1)
Potassium: 4.4 mEq/L (ref 3.5–5.1)
Potassium: 4.5 mEq/L (ref 3.5–5.1)
Sodium: 128 mEq/L — ABNORMAL LOW (ref 135–145)
Sodium: 129 mEq/L — ABNORMAL LOW (ref 135–145)
Sodium: 130 mEq/L — ABNORMAL LOW (ref 135–145)
Sodium: 133 mEq/L — ABNORMAL LOW (ref 135–145)
Sodium: 133 mEq/L — ABNORMAL LOW (ref 135–145)
Sodium: 136 mEq/L (ref 135–145)

## 2010-11-17 LAB — CSF CELL COUNT WITH DIFFERENTIAL
RBC Count, CSF: 43 /mm3 — ABNORMAL HIGH
Tube #: 3
WBC, CSF: 4 /mm3 (ref 0–5)

## 2010-11-17 LAB — VANCOMYCIN, TROUGH: Vancomycin Tr: 24.6 ug/mL — ABNORMAL HIGH (ref 10.0–20.0)

## 2010-11-17 LAB — POCT CARDIAC MARKERS
CKMB, poc: <1 ng/mL — ABNORMAL LOW (ref 1.0–8.0)
Myoglobin, poc: 57 ng/mL (ref 12–200)
Troponin i, poc: <0.05 ng/mL (ref 0.00–0.09)

## 2010-11-17 LAB — CRYPTOCOCCAL ANTIGEN, CSF: Cryptococcal Ag Titer: 16 — AB

## 2010-11-17 LAB — ETHANOL: Alcohol, Ethyl (B): <5 mg/dL (ref 0–10)

## 2010-11-17 LAB — GLUCOSE, CSF: Glucose, CSF: 19 mg/dL — ABNORMAL LOW (ref 43–76)

## 2010-11-17 LAB — GRAM STAIN

## 2010-11-17 LAB — AFB CULTURE WITH SMEAR (NOT AT ARMC): Acid Fast Smear: NONE SEEN

## 2010-11-17 LAB — URINE MICROSCOPIC-ADD ON

## 2010-11-17 LAB — URINE CULTURE: Colony Count: >=100000

## 2010-11-17 LAB — CSF CULTURE W GRAM STAIN: Culture: NO GROWTH

## 2010-11-17 LAB — HSV PCR
HSV 2 , PCR: NOT DETECTED
HSV, PCR: NOT DETECTED

## 2010-11-17 LAB — LACTATE DEHYDROGENASE: LDH: 149 U/L (ref 94–250)

## 2010-11-17 LAB — CRYPTOCOCCAL ANTIGEN: Cryptococcal Ag Titer: 256 — AB

## 2010-11-17 LAB — BRAIN NATRIURETIC PEPTIDE: Pro B Natriuretic peptide (BNP): <30 pg/mL (ref 0.0–100.0)

## 2010-11-17 LAB — PROTEIN, CSF: Total  Protein, CSF: 165 mg/dL — ABNORMAL HIGH (ref 15–45)

## 2010-11-17 MED ORDER — FLUCONAZOLE 200 MG PO TABS: 200.0000 mg | ORAL_TABLET | Freq: Every day | ORAL | Status: DC

## 2010-11-21 LAB — CBC
HCT: 44.3 % (ref 39.0–52.0)
HCT: 34.8 % — ABNORMAL LOW (ref 39.0–52.0)
HCT: 34.9 % — ABNORMAL LOW (ref 39.0–52.0)
HCT: 35.6 % — ABNORMAL LOW (ref 39.0–52.0)
HCT: 35.7 % — ABNORMAL LOW (ref 39.0–52.0)
HCT: 36.1 % — ABNORMAL LOW (ref 39.0–52.0)
HCT: 36.3 % — ABNORMAL LOW (ref 39.0–52.0)
HCT: 36.4 % — ABNORMAL LOW (ref 39.0–52.0)
HCT: 36.5 % — ABNORMAL LOW (ref 39.0–52.0)
HCT: 36.5 % — ABNORMAL LOW (ref 39.0–52.0)
HCT: 37.2 % — ABNORMAL LOW (ref 39.0–52.0)
HCT: 41.4 % (ref 39.0–52.0)
Hemoglobin: 14.7 g/dL (ref 13.0–17.0)
Hemoglobin: 11.5 g/dL — ABNORMAL LOW (ref 13.0–17.0)
Hemoglobin: 11.6 g/dL — ABNORMAL LOW (ref 13.0–17.0)
Hemoglobin: 11.9 g/dL — ABNORMAL LOW (ref 13.0–17.0)
Hemoglobin: 11.9 g/dL — ABNORMAL LOW (ref 13.0–17.0)
Hemoglobin: 11.9 g/dL — ABNORMAL LOW (ref 13.0–17.0)
Hemoglobin: 12 g/dL — ABNORMAL LOW (ref 13.0–17.0)
Hemoglobin: 12.1 g/dL — ABNORMAL LOW (ref 13.0–17.0)
Hemoglobin: 12.1 g/dL — ABNORMAL LOW (ref 13.0–17.0)
Hemoglobin: 12.2 g/dL — ABNORMAL LOW (ref 13.0–17.0)
Hemoglobin: 12.3 g/dL — ABNORMAL LOW (ref 13.0–17.0)
Hemoglobin: 13.9 g/dL (ref 13.0–17.0)
MCHC: 33.2 g/dL (ref 30.0–36.0)
MCHC: 32.9 g/dL (ref 30.0–36.0)
MCHC: 32.9 g/dL (ref 30.0–36.0)
MCHC: 33 g/dL (ref 30.0–36.0)
MCHC: 33 g/dL (ref 30.0–36.0)
MCHC: 33.1 g/dL (ref 30.0–36.0)
MCHC: 33.2 g/dL (ref 30.0–36.0)
MCHC: 33.2 g/dL (ref 30.0–36.0)
MCHC: 33.2 g/dL (ref 30.0–36.0)
MCHC: 33.4 g/dL (ref 30.0–36.0)
MCHC: 33.5 g/dL (ref 30.0–36.0)
MCHC: 33.6 g/dL (ref 30.0–36.0)
MCV: 87 fL (ref 78.0–100.0)
MCV: 85.5 fL (ref 78.0–100.0)
MCV: 85.8 fL (ref 78.0–100.0)
MCV: 85.8 fL (ref 78.0–100.0)
MCV: 85.9 fL (ref 78.0–100.0)
MCV: 85.9 fL (ref 78.0–100.0)
MCV: 86.1 fL (ref 78.0–100.0)
MCV: 86.2 fL (ref 78.0–100.0)
MCV: 86.3 fL (ref 78.0–100.0)
MCV: 86.4 fL (ref 78.0–100.0)
MCV: 86.5 fL (ref 78.0–100.0)
MCV: 86.5 fL (ref 78.0–100.0)
Platelets: 395 10*3/uL (ref 150–400)
Platelets: 320 10*3/uL (ref 150–400)
Platelets: 338 10*3/uL (ref 150–400)
Platelets: 339 10*3/uL (ref 150–400)
Platelets: 340 10*3/uL (ref 150–400)
Platelets: 355 10*3/uL (ref 150–400)
Platelets: 356 10*3/uL (ref 150–400)
Platelets: 359 10*3/uL (ref 150–400)
Platelets: 359 10*3/uL (ref 150–400)
Platelets: 362 10*3/uL (ref 150–400)
Platelets: 364 10*3/uL (ref 150–400)
Platelets: 381 10*3/uL (ref 150–400)
RBC: 5.09 MIL/uL (ref 4.22–5.81)
RBC: 4.03 MIL/uL — ABNORMAL LOW (ref 4.22–5.81)
RBC: 4.06 MIL/uL — ABNORMAL LOW (ref 4.22–5.81)
RBC: 4.13 MIL/uL — ABNORMAL LOW (ref 4.22–5.81)
RBC: 4.16 MIL/uL — ABNORMAL LOW (ref 4.22–5.81)
RBC: 4.21 MIL/uL — ABNORMAL LOW (ref 4.22–5.81)
RBC: 4.21 MIL/uL — ABNORMAL LOW (ref 4.22–5.81)
RBC: 4.22 MIL/uL (ref 4.22–5.81)
RBC: 4.25 MIL/uL (ref 4.22–5.81)
RBC: 4.25 MIL/uL (ref 4.22–5.81)
RBC: 4.32 MIL/uL (ref 4.22–5.81)
RBC: 4.78 MIL/uL (ref 4.22–5.81)
RDW: 13.3 % (ref 11.5–15.5)
RDW: 12.4 % (ref 11.5–15.5)
RDW: 12.4 % (ref 11.5–15.5)
RDW: 12.6 % (ref 11.5–15.5)
RDW: 12.6 % (ref 11.5–15.5)
RDW: 12.6 % (ref 11.5–15.5)
RDW: 12.6 % (ref 11.5–15.5)
RDW: 12.8 % (ref 11.5–15.5)
RDW: 12.8 % (ref 11.5–15.5)
RDW: 12.8 % (ref 11.5–15.5)
RDW: 12.9 % (ref 11.5–15.5)
RDW: 13.1 % (ref 11.5–15.5)
WBC: 5.3 10*3/uL (ref 4.0–10.5)
WBC: 2.4 10*3/uL — ABNORMAL LOW (ref 4.0–10.5)
WBC: 2.8 10*3/uL — ABNORMAL LOW (ref 4.0–10.5)
WBC: 2.8 10*3/uL — ABNORMAL LOW (ref 4.0–10.5)
WBC: 3 10*3/uL — ABNORMAL LOW (ref 4.0–10.5)
WBC: 3.1 10*3/uL — ABNORMAL LOW (ref 4.0–10.5)
WBC: 3.2 10*3/uL — ABNORMAL LOW (ref 4.0–10.5)
WBC: 3.3 10*3/uL — ABNORMAL LOW (ref 4.0–10.5)
WBC: 3.9 10*3/uL — ABNORMAL LOW (ref 4.0–10.5)
WBC: 4.4 10*3/uL (ref 4.0–10.5)
WBC: 5.5 10*3/uL (ref 4.0–10.5)
WBC: 6.1 10*3/uL (ref 4.0–10.5)

## 2010-11-21 LAB — AFB CULTURE, BLOOD

## 2010-11-21 LAB — COMPREHENSIVE METABOLIC PANEL
ALT: 16 U/L (ref 0–53)
ALT: 16 U/L (ref 0–53)
ALT: 17 U/L (ref 0–53)
ALT: 20 U/L (ref 0–53)
ALT: 23 U/L (ref 0–53)
ALT: 28 U/L (ref 0–53)
ALT: 33 U/L (ref 0–53)
AST: 43 U/L — ABNORMAL HIGH (ref 0–37)
AST: 29 U/L (ref 0–37)
AST: 31 U/L (ref 0–37)
AST: 35 U/L (ref 0–37)
AST: 40 U/L — ABNORMAL HIGH (ref 0–37)
AST: 41 U/L — ABNORMAL HIGH (ref 0–37)
AST: 51 U/L — ABNORMAL HIGH (ref 0–37)
Albumin: 2.3 g/dL — ABNORMAL LOW (ref 3.5–5.2)
Albumin: 1.9 g/dL — ABNORMAL LOW (ref 3.5–5.2)
Albumin: 2 g/dL — ABNORMAL LOW (ref 3.5–5.2)
Albumin: 2 g/dL — ABNORMAL LOW (ref 3.5–5.2)
Albumin: 2.3 g/dL — ABNORMAL LOW (ref 3.5–5.2)
Albumin: 2.5 g/dL — ABNORMAL LOW (ref 3.5–5.2)
Albumin: 2.6 g/dL — ABNORMAL LOW (ref 3.5–5.2)
Alkaline Phosphatase: 58 U/L (ref 39–117)
Alkaline Phosphatase: 59 U/L (ref 39–117)
Alkaline Phosphatase: 62 U/L (ref 39–117)
Alkaline Phosphatase: 64 U/L (ref 39–117)
Alkaline Phosphatase: 65 U/L (ref 39–117)
Alkaline Phosphatase: 73 U/L (ref 39–117)
Alkaline Phosphatase: 77 U/L (ref 39–117)
BUN: 10 mg/dL (ref 6–23)
BUN: 1 mg/dL — ABNORMAL LOW (ref 6–23)
BUN: 2 mg/dL — ABNORMAL LOW (ref 6–23)
BUN: 4 mg/dL — ABNORMAL LOW (ref 6–23)
BUN: 5 mg/dL — ABNORMAL LOW (ref 6–23)
BUN: 5 mg/dL — ABNORMAL LOW (ref 6–23)
BUN: 8 mg/dL (ref 6–23)
CO2: 26 mEq/L (ref 19–32)
CO2: 20 mEq/L (ref 19–32)
CO2: 22 mEq/L (ref 19–32)
CO2: 23 mEq/L (ref 19–32)
CO2: 23 mEq/L (ref 19–32)
CO2: 24 mEq/L (ref 19–32)
CO2: 25 mEq/L (ref 19–32)
Calcium: 8.2 mg/dL — ABNORMAL LOW (ref 8.4–10.5)
Calcium: 7.7 mg/dL — ABNORMAL LOW (ref 8.4–10.5)
Calcium: 8.2 mg/dL — ABNORMAL LOW (ref 8.4–10.5)
Calcium: 8.3 mg/dL — ABNORMAL LOW (ref 8.4–10.5)
Calcium: 8.5 mg/dL (ref 8.4–10.5)
Calcium: 8.8 mg/dL (ref 8.4–10.5)
Calcium: 8.9 mg/dL (ref 8.4–10.5)
Chloride: 98 mEq/L (ref 96–112)
Chloride: 101 mEq/L (ref 96–112)
Chloride: 101 mEq/L (ref 96–112)
Chloride: 102 mEq/L (ref 96–112)
Chloride: 102 mEq/L (ref 96–112)
Chloride: 96 mEq/L (ref 96–112)
Chloride: 99 mEq/L (ref 96–112)
Creatinine, Ser: 1 mg/dL (ref 0.4–1.5)
Creatinine, Ser: 0.7 mg/dL (ref 0.4–1.5)
Creatinine, Ser: 0.71 mg/dL (ref 0.4–1.5)
Creatinine, Ser: 0.76 mg/dL (ref 0.4–1.5)
Creatinine, Ser: 0.81 mg/dL (ref 0.4–1.5)
Creatinine, Ser: 0.82 mg/dL (ref 0.4–1.5)
Creatinine, Ser: 0.82 mg/dL (ref 0.4–1.5)
GFR calc Af Amer: >60 mL/min (ref >60)
GFR calc Af Amer: >60 mL/min (ref >60)
GFR calc Af Amer: >60 mL/min (ref >60)
GFR calc Af Amer: >60 mL/min (ref >60)
GFR calc Af Amer: >60 mL/min (ref >60)
GFR calc Af Amer: >60 mL/min (ref >60)
GFR calc Af Amer: >60 mL/min (ref >60)
GFR calc non Af Amer: >60 mL/min (ref >60)
GFR calc non Af Amer: >60 mL/min (ref >60)
GFR calc non Af Amer: >60 mL/min (ref >60)
GFR calc non Af Amer: >60 mL/min (ref >60)
GFR calc non Af Amer: >60 mL/min (ref >60)
GFR calc non Af Amer: >60 mL/min (ref >60)
GFR calc non Af Amer: >60 mL/min (ref >60)
Glucose, Bld: 84 mg/dL (ref 70–99)
Glucose, Bld: 68 mg/dL — ABNORMAL LOW (ref 70–99)
Glucose, Bld: 77 mg/dL (ref 70–99)
Glucose, Bld: 79 mg/dL (ref 70–99)
Glucose, Bld: 86 mg/dL (ref 70–99)
Glucose, Bld: 87 mg/dL (ref 70–99)
Glucose, Bld: 90 mg/dL (ref 70–99)
Potassium: 3.3 mEq/L — ABNORMAL LOW (ref 3.5–5.1)
Potassium: 3.3 mEq/L — ABNORMAL LOW (ref 3.5–5.1)
Potassium: 3.8 mEq/L (ref 3.5–5.1)
Potassium: 3.8 mEq/L (ref 3.5–5.1)
Potassium: 3.9 mEq/L (ref 3.5–5.1)
Potassium: 4 mEq/L (ref 3.5–5.1)
Potassium: 4.1 mEq/L (ref 3.5–5.1)
Sodium: 131 mEq/L — ABNORMAL LOW (ref 135–145)
Sodium: 127 mEq/L — ABNORMAL LOW (ref 135–145)
Sodium: 128 mEq/L — ABNORMAL LOW (ref 135–145)
Sodium: 129 mEq/L — ABNORMAL LOW (ref 135–145)
Sodium: 131 mEq/L — ABNORMAL LOW (ref 135–145)
Sodium: 132 mEq/L — ABNORMAL LOW (ref 135–145)
Sodium: 135 mEq/L (ref 135–145)
Total Bilirubin: 0.6 mg/dL (ref 0.3–1.2)
Total Bilirubin: 0.4 mg/dL (ref 0.3–1.2)
Total Bilirubin: 0.5 mg/dL (ref 0.3–1.2)
Total Bilirubin: 0.5 mg/dL (ref 0.3–1.2)
Total Bilirubin: 0.5 mg/dL (ref 0.3–1.2)
Total Bilirubin: 0.6 mg/dL (ref 0.3–1.2)
Total Bilirubin: 0.8 mg/dL (ref 0.3–1.2)
Total Protein: 8.4 g/dL — ABNORMAL HIGH (ref 6.0–8.3)
Total Protein: 7 g/dL (ref 6.0–8.3)
Total Protein: 7.1 g/dL (ref 6.0–8.3)
Total Protein: 7.1 g/dL (ref 6.0–8.3)
Total Protein: 7.3 g/dL (ref 6.0–8.3)
Total Protein: 7.9 g/dL (ref 6.0–8.3)
Total Protein: 8.3 g/dL (ref 6.0–8.3)

## 2010-11-21 LAB — BASIC METABOLIC PANEL
BUN: 2 mg/dL — ABNORMAL LOW (ref 6–23)
BUN: 2 mg/dL — ABNORMAL LOW (ref 6–23)
BUN: 3 mg/dL — ABNORMAL LOW (ref 6–23)
BUN: 6 mg/dL (ref 6–23)
BUN: 8 mg/dL (ref 6–23)
CO2: 21 mEq/L (ref 19–32)
CO2: 24 mEq/L (ref 19–32)
CO2: 24 mEq/L (ref 19–32)
CO2: 25 mEq/L (ref 19–32)
CO2: 25 mEq/L (ref 19–32)
Calcium: 7.7 mg/dL — ABNORMAL LOW (ref 8.4–10.5)
Calcium: 8.4 mg/dL (ref 8.4–10.5)
Calcium: 8.7 mg/dL (ref 8.4–10.5)
Calcium: 8.9 mg/dL (ref 8.4–10.5)
Calcium: 9 mg/dL (ref 8.4–10.5)
Chloride: 100 mEq/L (ref 96–112)
Chloride: 102 mEq/L (ref 96–112)
Chloride: 105 mEq/L (ref 96–112)
Chloride: 107 mEq/L (ref 96–112)
Chloride: 97 mEq/L (ref 96–112)
Creatinine, Ser: 0.7 mg/dL (ref 0.4–1.5)
Creatinine, Ser: 0.72 mg/dL (ref 0.4–1.5)
Creatinine, Ser: 0.72 mg/dL (ref 0.4–1.5)
Creatinine, Ser: 0.84 mg/dL (ref 0.4–1.5)
Creatinine, Ser: 0.89 mg/dL (ref 0.4–1.5)
GFR calc Af Amer: >60 mL/min (ref >60)
GFR calc Af Amer: >60 mL/min (ref >60)
GFR calc Af Amer: >60 mL/min (ref >60)
GFR calc Af Amer: >60 mL/min (ref >60)
GFR calc Af Amer: >60 mL/min (ref >60)
GFR calc non Af Amer: >60 mL/min (ref >60)
GFR calc non Af Amer: >60 mL/min (ref >60)
GFR calc non Af Amer: >60 mL/min (ref >60)
GFR calc non Af Amer: >60 mL/min (ref >60)
GFR calc non Af Amer: >60 mL/min (ref >60)
Glucose, Bld: 81 mg/dL (ref 70–99)
Glucose, Bld: 83 mg/dL (ref 70–99)
Glucose, Bld: 89 mg/dL (ref 70–99)
Glucose, Bld: 90 mg/dL (ref 70–99)
Glucose, Bld: 96 mg/dL (ref 70–99)
Potassium: 3.6 mEq/L (ref 3.5–5.1)
Potassium: 3.6 mEq/L (ref 3.5–5.1)
Potassium: 3.7 mEq/L (ref 3.5–5.1)
Potassium: 3.9 mEq/L (ref 3.5–5.1)
Potassium: 3.9 mEq/L (ref 3.5–5.1)
Sodium: 124 mEq/L — ABNORMAL LOW (ref 135–145)
Sodium: 132 mEq/L — ABNORMAL LOW (ref 135–145)
Sodium: 134 mEq/L — ABNORMAL LOW (ref 135–145)
Sodium: 137 mEq/L (ref 135–145)
Sodium: 138 mEq/L (ref 135–145)

## 2010-11-21 LAB — DIFFERENTIAL
Band Neutrophils: 0 % (ref 0–10)
Basophils Absolute: 0 10*3/uL (ref 0.0–0.1)
Basophils Absolute: 0 10*3/uL (ref 0.0–0.1)
Basophils Absolute: 0 10*3/uL (ref 0.0–0.1)
Basophils Absolute: 0 10*3/uL (ref 0.0–0.1)
Basophils Absolute: 0 10*3/uL (ref 0.0–0.1)
Basophils Relative: 1 % (ref 0–1)
Basophils Relative: 0 % (ref 0–1)
Basophils Relative: 0 % (ref 0–1)
Basophils Relative: 1 % (ref 0–1)
Basophils Relative: 1 % (ref 0–1)
Blasts: 0 %
Eosinophils Absolute: 0 10*3/uL (ref 0.0–0.7)
Eosinophils Absolute: 0 10*3/uL (ref 0.0–0.7)
Eosinophils Absolute: 0 10*3/uL (ref 0.0–0.7)
Eosinophils Absolute: 0.1 10*3/uL (ref 0.0–0.7)
Eosinophils Absolute: 0.1 10*3/uL (ref 0.0–0.7)
Eosinophils Relative: 0 % (ref 0–5)
Eosinophils Relative: 0 % (ref 0–5)
Eosinophils Relative: 0 % (ref 0–5)
Eosinophils Relative: 2 % (ref 0–5)
Eosinophils Relative: 2 % (ref 0–5)
Lymphocytes Relative: 15 % (ref 12–46)
Lymphocytes Relative: 22 % (ref 12–46)
Lymphocytes Relative: 39 % (ref 12–46)
Lymphocytes Relative: 43 % (ref 12–46)
Lymphocytes Relative: 9 % — ABNORMAL LOW (ref 12–46)
Lymphs Abs: 0.8 10*3/uL (ref 0.7–4.0)
Lymphs Abs: 0.5 10*3/uL — ABNORMAL LOW (ref 0.7–4.0)
Lymphs Abs: 1 10*3/uL (ref 0.7–4.0)
Lymphs Abs: 1.1 10*3/uL (ref 0.7–4.0)
Lymphs Abs: 1.2 10*3/uL (ref 0.7–4.0)
Metamyelocytes Relative: 0 %
Monocytes Absolute: 0.5 10*3/uL (ref 0.1–1.0)
Monocytes Absolute: 0.5 10*3/uL (ref 0.1–1.0)
Monocytes Absolute: 0.6 10*3/uL (ref 0.1–1.0)
Monocytes Absolute: 0.6 10*3/uL (ref 0.1–1.0)
Monocytes Absolute: 0.8 10*3/uL (ref 0.1–1.0)
Monocytes Relative: 9 % (ref 3–12)
Monocytes Relative: 10 % (ref 3–12)
Monocytes Relative: 16 % — ABNORMAL HIGH (ref 3–12)
Monocytes Relative: 19 % — ABNORMAL HIGH (ref 3–12)
Monocytes Relative: 20 % — ABNORMAL HIGH (ref 3–12)
Myelocytes: 0 %
Neutro Abs: 4 10*3/uL (ref 1.7–7.7)
Neutro Abs: 1 10*3/uL — ABNORMAL LOW (ref 1.7–7.7)
Neutro Abs: 1.2 10*3/uL — ABNORMAL LOW (ref 1.7–7.7)
Neutro Abs: 2.5 10*3/uL (ref 1.7–7.7)
Neutro Abs: 4.9 10*3/uL (ref 1.7–7.7)
Neutrophils Relative %: 76 % (ref 43–77)
Neutrophils Relative %: 38 % — ABNORMAL LOW (ref 43–77)
Neutrophils Relative %: 39 % — ABNORMAL LOW (ref 43–77)
Neutrophils Relative %: 58 % (ref 43–77)
Neutrophils Relative %: 81 % — ABNORMAL HIGH (ref 43–77)
Promyelocytes Absolute: 0 %
nRBC: 0 /100 WBC

## 2010-11-21 LAB — CSF CELL COUNT WITH DIFFERENTIAL
RBC Count, CSF: 38 /mm3 — ABNORMAL HIGH
Tube #: 2
WBC, CSF: 0 /mm3 (ref 0–5)

## 2010-11-21 LAB — MAGNESIUM
Magnesium: 1.9 mg/dL (ref 1.5–2.5)
Magnesium: 2 mg/dL (ref 1.5–2.5)

## 2010-11-21 LAB — CULTURE, RESPIRATORY W GRAM STAIN

## 2010-11-21 LAB — FUNGUS CULTURE, BLOOD

## 2010-11-21 LAB — EXPECTORATED SPUTUM ASSESSMENT W REFEX TO RESP CULTURE

## 2010-11-21 LAB — POCT I-STAT 3, ART BLOOD GAS (G3+)
Bicarbonate: 23.3 mEq/L (ref 20.0–24.0)
O2 Saturation: 96 %
TCO2: 24 mmol/L (ref 0–100)
pCO2 arterial: 32.5 mmHg — ABNORMAL LOW (ref 35.0–45.0)
pH, Arterial: 7.463 — ABNORMAL HIGH (ref 7.350–7.450)
pO2, Arterial: 77 mmHg — ABNORMAL LOW (ref 80.0–100.0)

## 2010-11-21 LAB — RAPID URINE DRUG SCREEN, HOSP PERFORMED
Amphetamines: NOT DETECTED
Barbiturates: NOT DETECTED
Benzodiazepines: NOT DETECTED
Cocaine: POSITIVE — AB
Opiates: NOT DETECTED
Tetrahydrocannabinol: NOT DETECTED

## 2010-11-21 LAB — LIPASE, BLOOD
Lipase: 22 U/L (ref 11–59)
Lipase: 20 U/L (ref 11–59)

## 2010-11-21 LAB — EXPECTORATED SPUTUM ASSESSMENT W GRAM STAIN, RFLX TO RESP C

## 2010-11-21 LAB — CARDIAC PANEL(CRET KIN+CKTOT+MB+TROPI)
CK, MB: 0.3 ng/mL (ref 0.3–4.0)
CK, MB: 0.4 ng/mL (ref 0.3–4.0)
CK, MB: 0.5 ng/mL (ref 0.3–4.0)
Relative Index: INVALID (ref 0.0–2.5)
Relative Index: INVALID (ref 0.0–2.5)
Relative Index: INVALID (ref 0.0–2.5)
Total CK: 39 U/L (ref 7–232)
Total CK: 46 U/L (ref 7–232)
Total CK: 55 U/L (ref 7–232)
Troponin I: 0.01 ng/mL (ref 0.00–0.06)
Troponin I: <0.01 ng/mL (ref 0.00–0.06)
Troponin I: <0.01 ng/mL (ref 0.00–0.06)

## 2010-11-21 LAB — URINALYSIS, ROUTINE W REFLEX MICROSCOPIC
Bilirubin Urine: NEGATIVE
Glucose, UA: NEGATIVE mg/dL
Glucose, UA: NEGATIVE mg/dL
Hgb urine dipstick: NEGATIVE
Hgb urine dipstick: NEGATIVE
Ketones, ur: 15 mg/dL — AB
Ketones, ur: NEGATIVE mg/dL
Leukocytes, UA: NEGATIVE
Nitrite: NEGATIVE
Nitrite: NEGATIVE
Protein, ur: 100 mg/dL — AB
Protein, ur: 30 mg/dL — AB
Specific Gravity, Urine: 1.025 (ref 1.005–1.030)
Specific Gravity, Urine: 1.016 (ref 1.005–1.030)
Urobilinogen, UA: 1 mg/dL (ref 0.0–1.0)
Urobilinogen, UA: 1 mg/dL (ref 0.0–1.0)
pH: 6 (ref 5.0–8.0)
pH: 5.5 (ref 5.0–8.0)

## 2010-11-21 LAB — URINE MICROSCOPIC-ADD ON

## 2010-11-21 LAB — HIV-1 GENOTYPR PLUS: Resistance Assoc RT Mutations: NOT DETECTED

## 2010-11-21 LAB — HCV RNA QUANT
HCV Quantitative Log: 6.93 {Log} — ABNORMAL HIGH (ref <1.63)
HCV Quantitative: 8520000 IU/mL — ABNORMAL HIGH (ref <43)

## 2010-11-21 LAB — FUNGUS CULTURE W SMEAR

## 2010-11-21 LAB — METHYLMALONIC ACID, SERUM
Methylmalonic Acid, Quantitative: 100 nmol/L (ref 87–318)
Methylmalonic Acid, Quantitative: 318 nmol/L (ref 87–318)

## 2010-11-21 LAB — LACTATE DEHYDROGENASE: LDH: 149 U/L (ref 94–250)

## 2010-11-21 LAB — AFB CULTURE WITH SMEAR (NOT AT ARMC): Acid Fast Smear: NONE SEEN

## 2010-11-21 LAB — CULTURE, BLOOD (ROUTINE X 2)
Culture: NO GROWTH
Culture: NO GROWTH

## 2010-11-21 LAB — CULTURE, RESPIRATORY

## 2010-11-21 LAB — VITAMIN B12: Vitamin B-12: 301 pg/mL (ref 211–911)

## 2010-11-21 LAB — GRAM STAIN

## 2010-11-21 LAB — CRYPTOCOCCAL ANTIGEN: Cryptococcal Ag Titer: >512 — AB

## 2010-11-21 LAB — VIRUS CULTURE: Preliminary Culture: NEGATIVE

## 2010-11-21 LAB — CSF CULTURE W GRAM STAIN

## 2010-11-21 LAB — T-HELPER CELL (CD4) - (RCID CLINIC ONLY)
CD4 % Helper T Cell: 4 % — ABNORMAL LOW (ref 33–55)
CD4 T Cell Abs: 40 uL — ABNORMAL LOW (ref 400–2700)

## 2010-11-21 LAB — CRYPTOCOCCAL ANTIGEN, CSF: Cryptococcal Ag Titer: 256 — AB

## 2010-11-21 LAB — URINE CULTURE
Colony Count: NO GROWTH
Culture: NO GROWTH
Special Requests: NEGATIVE

## 2010-11-21 LAB — CSF CULTURE

## 2010-11-21 LAB — PROTIME-INR
INR: 1.1 (ref 0.00–1.49)
Prothrombin Time: 14.9 seconds (ref 11.6–15.2)

## 2010-11-21 LAB — HIV-1 RNA, QUALITATIVE, TMA: HIV-1 RNA, Qualitative, TMA: UNDETERMINED

## 2010-11-21 LAB — RPR: RPR Ser Ql: NONREACTIVE

## 2010-11-21 LAB — P CARINII SMEAR DFA: Pneumocystis carinii DFA: NEGATIVE

## 2010-11-21 LAB — ETHANOL: Alcohol, Ethyl (B): <5 mg/dL (ref 0–10)

## 2010-11-21 LAB — APTT: aPTT: 33 seconds (ref 24–37)

## 2010-11-21 LAB — TSH: TSH: 0.8 u[IU]/mL (ref 0.350–4.500)

## 2010-11-21 LAB — OSMOLALITY: Osmolality: 267 mOsm/kg — ABNORMAL LOW (ref 275–300)

## 2010-11-21 LAB — VANCOMYCIN, TROUGH: Vancomycin Tr: 5.2 ug/mL — ABNORMAL LOW (ref 10.0–20.0)

## 2010-11-22 LAB — BASIC METABOLIC PANEL
BUN: 15 mg/dL (ref 6–23)
BUN: 15 mg/dL (ref 6–23)
BUN: 16 mg/dL (ref 6–23)
BUN: 19 mg/dL (ref 6–23)
BUN: 10 mg/dL (ref 6–23)
BUN: 11 mg/dL (ref 6–23)
BUN: 11 mg/dL (ref 6–23)
BUN: 12 mg/dL (ref 6–23)
BUN: 14 mg/dL (ref 6–23)
BUN: 16 mg/dL (ref 6–23)
BUN: 18 mg/dL (ref 6–23)
CO2: 19 mEq/L (ref 19–32)
CO2: 21 mEq/L (ref 19–32)
CO2: 21 mEq/L (ref 19–32)
CO2: 23 mEq/L (ref 19–32)
CO2: 15 mEq/L — ABNORMAL LOW (ref 19–32)
CO2: 17 mEq/L — ABNORMAL LOW (ref 19–32)
CO2: 18 mEq/L — ABNORMAL LOW (ref 19–32)
CO2: 18 mEq/L — ABNORMAL LOW (ref 19–32)
CO2: 19 mEq/L (ref 19–32)
CO2: 20 mEq/L (ref 19–32)
CO2: 21 mEq/L (ref 19–32)
Calcium: 8 mg/dL — ABNORMAL LOW (ref 8.4–10.5)
Calcium: 8 mg/dL — ABNORMAL LOW (ref 8.4–10.5)
Calcium: 8.2 mg/dL — ABNORMAL LOW (ref 8.4–10.5)
Calcium: 8.3 mg/dL — ABNORMAL LOW (ref 8.4–10.5)
Calcium: 8 mg/dL — ABNORMAL LOW (ref 8.4–10.5)
Calcium: 8 mg/dL — ABNORMAL LOW (ref 8.4–10.5)
Calcium: 8 mg/dL — ABNORMAL LOW (ref 8.4–10.5)
Calcium: 8.2 mg/dL — ABNORMAL LOW (ref 8.4–10.5)
Calcium: 8.3 mg/dL — ABNORMAL LOW (ref 8.4–10.5)
Calcium: 8.3 mg/dL — ABNORMAL LOW (ref 8.4–10.5)
Calcium: 8.5 mg/dL (ref 8.4–10.5)
Chloride: 101 mEq/L (ref 96–112)
Chloride: 95 mEq/L — ABNORMAL LOW (ref 96–112)
Chloride: 96 mEq/L (ref 96–112)
Chloride: 98 mEq/L (ref 96–112)
Chloride: 103 mEq/L (ref 96–112)
Chloride: 104 mEq/L (ref 96–112)
Chloride: 104 mEq/L (ref 96–112)
Chloride: 105 mEq/L (ref 96–112)
Chloride: 105 mEq/L (ref 96–112)
Chloride: 107 mEq/L (ref 96–112)
Chloride: 107 mEq/L (ref 96–112)
Creatinine, Ser: 1.31 mg/dL (ref 0.4–1.5)
Creatinine, Ser: 1.36 mg/dL (ref 0.4–1.5)
Creatinine, Ser: 1.79 mg/dL — ABNORMAL HIGH (ref 0.4–1.5)
Creatinine, Ser: 2.03 mg/dL — ABNORMAL HIGH (ref 0.4–1.5)
Creatinine, Ser: 1.06 mg/dL (ref 0.4–1.5)
Creatinine, Ser: 1.06 mg/dL (ref 0.4–1.5)
Creatinine, Ser: 1.11 mg/dL (ref 0.4–1.5)
Creatinine, Ser: 1.12 mg/dL (ref 0.4–1.5)
Creatinine, Ser: 1.39 mg/dL (ref 0.4–1.5)
Creatinine, Ser: 1.45 mg/dL (ref 0.4–1.5)
Creatinine, Ser: 1.56 mg/dL — ABNORMAL HIGH (ref 0.4–1.5)
GFR calc Af Amer: 42 mL/min — ABNORMAL LOW (ref >60)
GFR calc Af Amer: 48 mL/min — ABNORMAL LOW (ref >60)
GFR calc Af Amer: >60 mL/min (ref >60)
GFR calc Af Amer: >60 mL/min (ref >60)
GFR calc Af Amer: 57 mL/min — ABNORMAL LOW (ref >60)
GFR calc Af Amer: >60 mL/min (ref >60)
GFR calc Af Amer: >60 mL/min (ref >60)
GFR calc Af Amer: >60 mL/min (ref >60)
GFR calc Af Amer: >60 mL/min (ref >60)
GFR calc Af Amer: >60 mL/min (ref >60)
GFR calc Af Amer: >60 mL/min (ref >60)
GFR calc non Af Amer: 35 mL/min — ABNORMAL LOW (ref >60)
GFR calc non Af Amer: 40 mL/min — ABNORMAL LOW (ref >60)
GFR calc non Af Amer: 55 mL/min — ABNORMAL LOW (ref >60)
GFR calc non Af Amer: 57 mL/min — ABNORMAL LOW (ref >60)
GFR calc non Af Amer: 47 mL/min — ABNORMAL LOW (ref >60)
GFR calc non Af Amer: 51 mL/min — ABNORMAL LOW (ref >60)
GFR calc non Af Amer: 53 mL/min — ABNORMAL LOW (ref >60)
GFR calc non Af Amer: >60 mL/min (ref >60)
GFR calc non Af Amer: >60 mL/min (ref >60)
GFR calc non Af Amer: >60 mL/min (ref >60)
GFR calc non Af Amer: >60 mL/min (ref >60)
Glucose, Bld: 123 mg/dL — ABNORMAL HIGH (ref 70–99)
Glucose, Bld: 90 mg/dL (ref 70–99)
Glucose, Bld: 92 mg/dL (ref 70–99)
Glucose, Bld: 99 mg/dL (ref 70–99)
Glucose, Bld: 105 mg/dL — ABNORMAL HIGH (ref 70–99)
Glucose, Bld: 114 mg/dL — ABNORMAL HIGH (ref 70–99)
Glucose, Bld: 123 mg/dL — ABNORMAL HIGH (ref 70–99)
Glucose, Bld: 126 mg/dL — ABNORMAL HIGH (ref 70–99)
Glucose, Bld: 131 mg/dL — ABNORMAL HIGH (ref 70–99)
Glucose, Bld: 83 mg/dL (ref 70–99)
Glucose, Bld: 94 mg/dL (ref 70–99)
Potassium: 3.1 mEq/L — ABNORMAL LOW (ref 3.5–5.1)
Potassium: 3.3 mEq/L — ABNORMAL LOW (ref 3.5–5.1)
Potassium: 3.7 mEq/L (ref 3.5–5.1)
Potassium: 3.7 mEq/L (ref 3.5–5.1)
Potassium: 3.2 mEq/L — ABNORMAL LOW (ref 3.5–5.1)
Potassium: 3.5 mEq/L (ref 3.5–5.1)
Potassium: 3.6 mEq/L (ref 3.5–5.1)
Potassium: 3.7 mEq/L (ref 3.5–5.1)
Potassium: 3.8 mEq/L (ref 3.5–5.1)
Potassium: 3.8 mEq/L (ref 3.5–5.1)
Potassium: 3.8 mEq/L (ref 3.5–5.1)
Sodium: 124 mEq/L — ABNORMAL LOW (ref 135–145)
Sodium: 125 mEq/L — ABNORMAL LOW (ref 135–145)
Sodium: 126 mEq/L — ABNORMAL LOW (ref 135–145)
Sodium: 127 mEq/L — ABNORMAL LOW (ref 135–145)
Sodium: 129 mEq/L — ABNORMAL LOW (ref 135–145)
Sodium: 129 mEq/L — ABNORMAL LOW (ref 135–145)
Sodium: 129 mEq/L — ABNORMAL LOW (ref 135–145)
Sodium: 130 mEq/L — ABNORMAL LOW (ref 135–145)
Sodium: 130 mEq/L — ABNORMAL LOW (ref 135–145)
Sodium: 130 mEq/L — ABNORMAL LOW (ref 135–145)
Sodium: 133 mEq/L — ABNORMAL LOW (ref 135–145)

## 2010-11-22 LAB — COMPREHENSIVE METABOLIC PANEL
ALT: 15 U/L (ref 0–53)
ALT: 18 U/L (ref 0–53)
ALT: 29 U/L (ref 0–53)
ALT: 30 U/L (ref 0–53)
ALT: 30 U/L (ref 0–53)
ALT: 35 U/L (ref 0–53)
ALT: 36 U/L (ref 0–53)
ALT: 38 U/L (ref 0–53)
ALT: 41 U/L (ref 0–53)
ALT: 11 U/L (ref 0–53)
AST: 19 U/L (ref 0–37)
AST: 22 U/L (ref 0–37)
AST: 34 U/L (ref 0–37)
AST: 35 U/L (ref 0–37)
AST: 38 U/L — ABNORMAL HIGH (ref 0–37)
AST: 41 U/L — ABNORMAL HIGH (ref 0–37)
AST: 49 U/L — ABNORMAL HIGH (ref 0–37)
AST: 52 U/L — ABNORMAL HIGH (ref 0–37)
AST: 55 U/L — ABNORMAL HIGH (ref 0–37)
AST: 19 U/L (ref 0–37)
Albumin: 2.1 g/dL — ABNORMAL LOW (ref 3.5–5.2)
Albumin: 2.1 g/dL — ABNORMAL LOW (ref 3.5–5.2)
Albumin: 2.2 g/dL — ABNORMAL LOW (ref 3.5–5.2)
Albumin: 2.2 g/dL — ABNORMAL LOW (ref 3.5–5.2)
Albumin: 2.2 g/dL — ABNORMAL LOW (ref 3.5–5.2)
Albumin: 2.3 g/dL — ABNORMAL LOW (ref 3.5–5.2)
Albumin: 2.3 g/dL — ABNORMAL LOW (ref 3.5–5.2)
Albumin: 2.5 g/dL — ABNORMAL LOW (ref 3.5–5.2)
Albumin: 3 g/dL — ABNORMAL LOW (ref 3.5–5.2)
Albumin: 2.2 g/dL — ABNORMAL LOW (ref 3.5–5.2)
Alkaline Phosphatase: 100 U/L (ref 39–117)
Alkaline Phosphatase: 108 U/L (ref 39–117)
Alkaline Phosphatase: 108 U/L (ref 39–117)
Alkaline Phosphatase: 67 U/L (ref 39–117)
Alkaline Phosphatase: 69 U/L (ref 39–117)
Alkaline Phosphatase: 71 U/L (ref 39–117)
Alkaline Phosphatase: 80 U/L (ref 39–117)
Alkaline Phosphatase: 88 U/L (ref 39–117)
Alkaline Phosphatase: 94 U/L (ref 39–117)
Alkaline Phosphatase: 73 U/L (ref 39–117)
BUN: 10 mg/dL (ref 6–23)
BUN: 12 mg/dL (ref 6–23)
BUN: 12 mg/dL (ref 6–23)
BUN: 16 mg/dL (ref 6–23)
BUN: 17 mg/dL (ref 6–23)
BUN: 18 mg/dL (ref 6–23)
BUN: 7 mg/dL (ref 6–23)
BUN: 8 mg/dL (ref 6–23)
BUN: 9 mg/dL (ref 6–23)
BUN: 12 mg/dL (ref 6–23)
CO2: 21 mEq/L (ref 19–32)
CO2: 23 mEq/L (ref 19–32)
CO2: 24 mEq/L (ref 19–32)
CO2: 24 mEq/L (ref 19–32)
CO2: 24 mEq/L (ref 19–32)
CO2: 25 mEq/L (ref 19–32)
CO2: 26 mEq/L (ref 19–32)
CO2: 27 mEq/L (ref 19–32)
CO2: 27 mEq/L (ref 19–32)
CO2: 16 mEq/L — ABNORMAL LOW (ref 19–32)
Calcium: 8.3 mg/dL — ABNORMAL LOW (ref 8.4–10.5)
Calcium: 8.3 mg/dL — ABNORMAL LOW (ref 8.4–10.5)
Calcium: 8.5 mg/dL (ref 8.4–10.5)
Calcium: 8.5 mg/dL (ref 8.4–10.5)
Calcium: 8.5 mg/dL (ref 8.4–10.5)
Calcium: 8.6 mg/dL (ref 8.4–10.5)
Calcium: 8.8 mg/dL (ref 8.4–10.5)
Calcium: 9 mg/dL (ref 8.4–10.5)
Calcium: 9.4 mg/dL (ref 8.4–10.5)
Calcium: 8.5 mg/dL (ref 8.4–10.5)
Chloride: 101 mEq/L (ref 96–112)
Chloride: 103 mEq/L (ref 96–112)
Chloride: 103 mEq/L (ref 96–112)
Chloride: 104 mEq/L (ref 96–112)
Chloride: 105 mEq/L (ref 96–112)
Chloride: 105 mEq/L (ref 96–112)
Chloride: 110 mEq/L (ref 96–112)
Chloride: 97 mEq/L (ref 96–112)
Chloride: 99 mEq/L (ref 96–112)
Chloride: 110 mEq/L (ref 96–112)
Creatinine, Ser: 0.74 mg/dL (ref 0.4–1.5)
Creatinine, Ser: 0.86 mg/dL (ref 0.4–1.5)
Creatinine, Ser: 0.94 mg/dL (ref 0.4–1.5)
Creatinine, Ser: 0.96 mg/dL (ref 0.4–1.5)
Creatinine, Ser: 1.3 mg/dL (ref 0.4–1.5)
Creatinine, Ser: 1.36 mg/dL (ref 0.4–1.5)
Creatinine, Ser: 1.61 mg/dL — ABNORMAL HIGH (ref 0.4–1.5)
Creatinine, Ser: 1.85 mg/dL — ABNORMAL HIGH (ref 0.4–1.5)
Creatinine, Ser: 2.14 mg/dL — ABNORMAL HIGH (ref 0.4–1.5)
Creatinine, Ser: 1.02 mg/dL (ref 0.4–1.5)
GFR calc Af Amer: 39 mL/min — ABNORMAL LOW (ref >60)
GFR calc Af Amer: 47 mL/min — ABNORMAL LOW (ref >60)
GFR calc Af Amer: 55 mL/min — ABNORMAL LOW (ref >60)
GFR calc Af Amer: >60 mL/min (ref >60)
GFR calc Af Amer: >60 mL/min (ref >60)
GFR calc Af Amer: >60 mL/min (ref >60)
GFR calc Af Amer: >60 mL/min (ref >60)
GFR calc Af Amer: >60 mL/min (ref >60)
GFR calc Af Amer: >60 mL/min (ref >60)
GFR calc Af Amer: >60 mL/min (ref >60)
GFR calc non Af Amer: 32 mL/min — ABNORMAL LOW (ref >60)
GFR calc non Af Amer: 38 mL/min — ABNORMAL LOW (ref >60)
GFR calc non Af Amer: 45 mL/min — ABNORMAL LOW (ref >60)
GFR calc non Af Amer: 55 mL/min — ABNORMAL LOW (ref >60)
GFR calc non Af Amer: 58 mL/min — ABNORMAL LOW (ref >60)
GFR calc non Af Amer: >60 mL/min (ref >60)
GFR calc non Af Amer: >60 mL/min (ref >60)
GFR calc non Af Amer: >60 mL/min (ref >60)
GFR calc non Af Amer: >60 mL/min (ref >60)
GFR calc non Af Amer: >60 mL/min (ref >60)
Glucose, Bld: 104 mg/dL — ABNORMAL HIGH (ref 70–99)
Glucose, Bld: 127 mg/dL — ABNORMAL HIGH (ref 70–99)
Glucose, Bld: 82 mg/dL (ref 70–99)
Glucose, Bld: 84 mg/dL (ref 70–99)
Glucose, Bld: 84 mg/dL (ref 70–99)
Glucose, Bld: 89 mg/dL (ref 70–99)
Glucose, Bld: 91 mg/dL (ref 70–99)
Glucose, Bld: 91 mg/dL (ref 70–99)
Glucose, Bld: 97 mg/dL (ref 70–99)
Glucose, Bld: 127 mg/dL — ABNORMAL HIGH (ref 70–99)
Potassium: 3.1 mEq/L — ABNORMAL LOW (ref 3.5–5.1)
Potassium: 3.1 mEq/L — ABNORMAL LOW (ref 3.5–5.1)
Potassium: 3.3 mEq/L — ABNORMAL LOW (ref 3.5–5.1)
Potassium: 3.5 mEq/L (ref 3.5–5.1)
Potassium: 3.7 mEq/L (ref 3.5–5.1)
Potassium: 3.8 mEq/L (ref 3.5–5.1)
Potassium: 4 mEq/L (ref 3.5–5.1)
Potassium: 4.2 mEq/L (ref 3.5–5.1)
Potassium: 4.3 mEq/L (ref 3.5–5.1)
Potassium: 3.9 mEq/L (ref 3.5–5.1)
Sodium: 128 mEq/L — ABNORMAL LOW (ref 135–145)
Sodium: 128 mEq/L — ABNORMAL LOW (ref 135–145)
Sodium: 131 mEq/L — ABNORMAL LOW (ref 135–145)
Sodium: 135 mEq/L (ref 135–145)
Sodium: 136 mEq/L (ref 135–145)
Sodium: 136 mEq/L (ref 135–145)
Sodium: 137 mEq/L (ref 135–145)
Sodium: 139 mEq/L (ref 135–145)
Sodium: 140 mEq/L (ref 135–145)
Sodium: 135 mEq/L (ref 135–145)
Total Bilirubin: 0.4 mg/dL (ref 0.3–1.2)
Total Bilirubin: 0.5 mg/dL (ref 0.3–1.2)
Total Bilirubin: 0.5 mg/dL (ref 0.3–1.2)
Total Bilirubin: 0.5 mg/dL (ref 0.3–1.2)
Total Bilirubin: 0.5 mg/dL (ref 0.3–1.2)
Total Bilirubin: 0.5 mg/dL (ref 0.3–1.2)
Total Bilirubin: 0.6 mg/dL (ref 0.3–1.2)
Total Bilirubin: 0.8 mg/dL (ref 0.3–1.2)
Total Bilirubin: 0.8 mg/dL (ref 0.3–1.2)
Total Bilirubin: 0.5 mg/dL (ref 0.3–1.2)
Total Protein: 6.3 g/dL (ref 6.0–8.3)
Total Protein: 6.6 g/dL (ref 6.0–8.3)
Total Protein: 6.7 g/dL (ref 6.0–8.3)
Total Protein: 6.8 g/dL (ref 6.0–8.3)
Total Protein: 6.8 g/dL (ref 6.0–8.3)
Total Protein: 7.2 g/dL (ref 6.0–8.3)
Total Protein: 7.2 g/dL (ref 6.0–8.3)
Total Protein: 7.2 g/dL (ref 6.0–8.3)
Total Protein: 8.7 g/dL — ABNORMAL HIGH (ref 6.0–8.3)
Total Protein: 6.6 g/dL (ref 6.0–8.3)

## 2010-11-22 LAB — CBC
HCT: 28.7 % — ABNORMAL LOW (ref 39.0–52.0)
HCT: 29.3 % — ABNORMAL LOW (ref 39.0–52.0)
HCT: 30 % — ABNORMAL LOW (ref 39.0–52.0)
HCT: 30.3 % — ABNORMAL LOW (ref 39.0–52.0)
HCT: 30.5 % — ABNORMAL LOW (ref 39.0–52.0)
HCT: 31.4 % — ABNORMAL LOW (ref 39.0–52.0)
HCT: 32.5 % — ABNORMAL LOW (ref 39.0–52.0)
HCT: 33.1 % — ABNORMAL LOW (ref 39.0–52.0)
HCT: 33.3 % — ABNORMAL LOW (ref 39.0–52.0)
HCT: 36.6 % — ABNORMAL LOW (ref 39.0–52.0)
HCT: 28.8 % — ABNORMAL LOW (ref 39.0–52.0)
HCT: 29 % — ABNORMAL LOW (ref 39.0–52.0)
HCT: 29.3 % — ABNORMAL LOW (ref 39.0–52.0)
HCT: 31 % — ABNORMAL LOW (ref 39.0–52.0)
Hemoglobin: 10.1 g/dL — ABNORMAL LOW (ref 13.0–17.0)
Hemoglobin: 10.4 g/dL — ABNORMAL LOW (ref 13.0–17.0)
Hemoglobin: 10.6 g/dL — ABNORMAL LOW (ref 13.0–17.0)
Hemoglobin: 10.7 g/dL — ABNORMAL LOW (ref 13.0–17.0)
Hemoglobin: 10.9 g/dL — ABNORMAL LOW (ref 13.0–17.0)
Hemoglobin: 11.1 g/dL — ABNORMAL LOW (ref 13.0–17.0)
Hemoglobin: 11.2 g/dL — ABNORMAL LOW (ref 13.0–17.0)
Hemoglobin: 11.2 g/dL — ABNORMAL LOW (ref 13.0–17.0)
Hemoglobin: 12.5 g/dL — ABNORMAL LOW (ref 13.0–17.0)
Hemoglobin: 9.9 g/dL — ABNORMAL LOW (ref 13.0–17.0)
Hemoglobin: 10.2 g/dL — ABNORMAL LOW (ref 13.0–17.0)
Hemoglobin: 10.7 g/dL — ABNORMAL LOW (ref 13.0–17.0)
Hemoglobin: 9.9 g/dL — ABNORMAL LOW (ref 13.0–17.0)
Hemoglobin: 9.9 g/dL — ABNORMAL LOW (ref 13.0–17.0)
MCHC: 33.6 g/dL (ref 30.0–36.0)
MCHC: 33.8 g/dL (ref 30.0–36.0)
MCHC: 34 g/dL (ref 30.0–36.0)
MCHC: 34.1 g/dL (ref 30.0–36.0)
MCHC: 34.5 g/dL (ref 30.0–36.0)
MCHC: 34.6 g/dL (ref 30.0–36.0)
MCHC: 34.6 g/dL (ref 30.0–36.0)
MCHC: 34.8 g/dL (ref 30.0–36.0)
MCHC: 35 g/dL (ref 30.0–36.0)
MCHC: 35.2 g/dL (ref 30.0–36.0)
MCHC: 34.2 g/dL (ref 30.0–36.0)
MCHC: 34.5 g/dL (ref 30.0–36.0)
MCHC: 34.7 g/dL (ref 30.0–36.0)
MCHC: 34.9 g/dL (ref 30.0–36.0)
MCV: 83.4 fL (ref 78.0–100.0)
MCV: 83.5 fL (ref 78.0–100.0)
MCV: 83.6 fL (ref 78.0–100.0)
MCV: 83.8 fL (ref 78.0–100.0)
MCV: 84.1 fL (ref 78.0–100.0)
MCV: 84.7 fL (ref 78.0–100.0)
MCV: 84.7 fL (ref 78.0–100.0)
MCV: 84.9 fL (ref 78.0–100.0)
MCV: 85.2 fL (ref 78.0–100.0)
MCV: 85.5 fL (ref 78.0–100.0)
MCV: 84.6 fL (ref 78.0–100.0)
MCV: 84.9 fL (ref 78.0–100.0)
MCV: 85.2 fL (ref 78.0–100.0)
MCV: 85.2 fL (ref 78.0–100.0)
Platelets: 209 10*3/uL (ref 150–400)
Platelets: 221 10*3/uL (ref 150–400)
Platelets: 235 10*3/uL (ref 150–400)
Platelets: 286 10*3/uL (ref 150–400)
Platelets: 326 10*3/uL (ref 150–400)
Platelets: 337 10*3/uL (ref 150–400)
Platelets: 342 10*3/uL (ref 150–400)
Platelets: 359 10*3/uL (ref 150–400)
Platelets: 374 10*3/uL (ref 150–400)
Platelets: 399 10*3/uL (ref 150–400)
Platelets: 234 10*3/uL (ref 150–400)
Platelets: 242 10*3/uL (ref 150–400)
Platelets: 263 10*3/uL (ref 150–400)
Platelets: 302 10*3/uL (ref 150–400)
RBC: 3.39 MIL/uL — ABNORMAL LOW (ref 4.22–5.81)
RBC: 3.46 MIL/uL — ABNORMAL LOW (ref 4.22–5.81)
RBC: 3.52 MIL/uL — ABNORMAL LOW (ref 4.22–5.81)
RBC: 3.63 MIL/uL — ABNORMAL LOW (ref 4.22–5.81)
RBC: 3.66 MIL/uL — ABNORMAL LOW (ref 4.22–5.81)
RBC: 3.76 MIL/uL — ABNORMAL LOW (ref 4.22–5.81)
RBC: 3.87 MIL/uL — ABNORMAL LOW (ref 4.22–5.81)
RBC: 3.87 MIL/uL — ABNORMAL LOW (ref 4.22–5.81)
RBC: 3.92 MIL/uL — ABNORMAL LOW (ref 4.22–5.81)
RBC: 4.36 MIL/uL (ref 4.22–5.81)
RBC: 3.38 MIL/uL — ABNORMAL LOW (ref 4.22–5.81)
RBC: 3.44 MIL/uL — ABNORMAL LOW (ref 4.22–5.81)
RBC: 3.45 MIL/uL — ABNORMAL LOW (ref 4.22–5.81)
RBC: 3.64 MIL/uL — ABNORMAL LOW (ref 4.22–5.81)
RDW: 12.7 % (ref 11.5–15.5)
RDW: 12.7 % (ref 11.5–15.5)
RDW: 12.8 % (ref 11.5–15.5)
RDW: 12.8 % (ref 11.5–15.5)
RDW: 12.8 % (ref 11.5–15.5)
RDW: 12.9 % (ref 11.5–15.5)
RDW: 12.9 % (ref 11.5–15.5)
RDW: 13 % (ref 11.5–15.5)
RDW: 13 % (ref 11.5–15.5)
RDW: 13 % (ref 11.5–15.5)
RDW: 13.4 % (ref 11.5–15.5)
RDW: 13.4 % (ref 11.5–15.5)
RDW: 13.5 % (ref 11.5–15.5)
RDW: 13.7 % (ref 11.5–15.5)
WBC: 3.5 10*3/uL — ABNORMAL LOW (ref 4.0–10.5)
WBC: 3.8 10*3/uL — ABNORMAL LOW (ref 4.0–10.5)
WBC: 3.9 10*3/uL — ABNORMAL LOW (ref 4.0–10.5)
WBC: 3.9 10*3/uL — ABNORMAL LOW (ref 4.0–10.5)
WBC: 4.4 10*3/uL (ref 4.0–10.5)
WBC: 4.5 10*3/uL (ref 4.0–10.5)
WBC: 4.9 10*3/uL (ref 4.0–10.5)
WBC: 5.9 10*3/uL (ref 4.0–10.5)
WBC: 7.3 10*3/uL (ref 4.0–10.5)
WBC: 9 10*3/uL (ref 4.0–10.5)
WBC: 4.4 10*3/uL (ref 4.0–10.5)
WBC: 4.8 10*3/uL (ref 4.0–10.5)
WBC: 4.9 10*3/uL (ref 4.0–10.5)
WBC: 7.4 10*3/uL (ref 4.0–10.5)

## 2010-11-22 LAB — TSH: TSH: 0.889 u[IU]/mL (ref 0.350–4.500)

## 2010-11-22 LAB — MISCELLANEOUS TEST

## 2010-11-22 LAB — DIFFERENTIAL
Band Neutrophils: 0 % (ref 0–10)
Basophils Absolute: 0 10*3/uL (ref 0.0–0.1)
Basophils Absolute: 0 10*3/uL (ref 0.0–0.1)
Basophils Absolute: 0 10*3/uL (ref 0.0–0.1)
Basophils Absolute: 0.1 10*3/uL (ref 0.0–0.1)
Basophils Absolute: 0.1 10*3/uL (ref 0.0–0.1)
Basophils Absolute: 0.1 10*3/uL (ref 0.0–0.1)
Basophils Absolute: 0 10*3/uL (ref 0.0–0.1)
Basophils Absolute: 0 10*3/uL (ref 0.0–0.1)
Basophils Relative: 0 % (ref 0–1)
Basophils Relative: 0 % (ref 0–1)
Basophils Relative: 1 % (ref 0–1)
Basophils Relative: 1 % (ref 0–1)
Basophils Relative: 1 % (ref 0–1)
Basophils Relative: 2 % — ABNORMAL HIGH (ref 0–1)
Basophils Relative: 0 % (ref 0–1)
Basophils Relative: 0 % (ref 0–1)
Blasts: 0 %
Eosinophils Absolute: 0 10*3/uL (ref 0.0–0.7)
Eosinophils Absolute: 0 10*3/uL (ref 0.0–0.7)
Eosinophils Absolute: 0 10*3/uL (ref 0.0–0.7)
Eosinophils Absolute: 0 10*3/uL (ref 0.0–0.7)
Eosinophils Absolute: 0.1 10*3/uL (ref 0.0–0.7)
Eosinophils Absolute: 0.1 10*3/uL (ref 0.0–0.7)
Eosinophils Absolute: 0 10*3/uL (ref 0.0–0.7)
Eosinophils Absolute: 0 10*3/uL (ref 0.0–0.7)
Eosinophils Relative: 0 % (ref 0–5)
Eosinophils Relative: 0 % (ref 0–5)
Eosinophils Relative: 1 % (ref 0–5)
Eosinophils Relative: 1 % (ref 0–5)
Eosinophils Relative: 2 % (ref 0–5)
Eosinophils Relative: 2 % (ref 0–5)
Eosinophils Relative: 0 % (ref 0–5)
Eosinophils Relative: 1 % (ref 0–5)
Lymphocytes Relative: 15 % (ref 12–46)
Lymphocytes Relative: 21 % (ref 12–46)
Lymphocytes Relative: 22 % (ref 12–46)
Lymphocytes Relative: 28 % (ref 12–46)
Lymphocytes Relative: 30 % (ref 12–46)
Lymphocytes Relative: 7 % — ABNORMAL LOW (ref 12–46)
Lymphocytes Relative: 11 % — ABNORMAL LOW (ref 12–46)
Lymphocytes Relative: 12 % (ref 12–46)
Lymphs Abs: 0.5 10*3/uL — ABNORMAL LOW (ref 0.7–4.0)
Lymphs Abs: 0.8 10*3/uL (ref 0.7–4.0)
Lymphs Abs: 0.9 10*3/uL (ref 0.7–4.0)
Lymphs Abs: 0.9 10*3/uL (ref 0.7–4.0)
Lymphs Abs: 1.1 10*3/uL (ref 0.7–4.0)
Lymphs Abs: 1.2 10*3/uL (ref 0.7–4.0)
Lymphs Abs: 0.6 10*3/uL — ABNORMAL LOW (ref 0.7–4.0)
Lymphs Abs: 0.8 10*3/uL (ref 0.7–4.0)
Metamyelocytes Relative: 0 %
Monocytes Absolute: 0.3 10*3/uL (ref 0.1–1.0)
Monocytes Absolute: 0.5 10*3/uL (ref 0.1–1.0)
Monocytes Absolute: 0.5 10*3/uL (ref 0.1–1.0)
Monocytes Absolute: 0.7 10*3/uL (ref 0.1–1.0)
Monocytes Absolute: 0.8 10*3/uL (ref 0.1–1.0)
Monocytes Absolute: 0.9 10*3/uL (ref 0.1–1.0)
Monocytes Absolute: 0.2 10*3/uL (ref 0.1–1.0)
Monocytes Absolute: 0.5 10*3/uL (ref 0.1–1.0)
Monocytes Relative: 12 % (ref 3–12)
Monocytes Relative: 15 % — ABNORMAL HIGH (ref 3–12)
Monocytes Relative: 15 % — ABNORMAL HIGH (ref 3–12)
Monocytes Relative: 16 % — ABNORMAL HIGH (ref 3–12)
Monocytes Relative: 20 % — ABNORMAL HIGH (ref 3–12)
Monocytes Relative: 4 % (ref 3–12)
Monocytes Relative: 4 % (ref 3–12)
Monocytes Relative: 7 % (ref 3–12)
Myelocytes: 0 %
Neutro Abs: 1.8 10*3/uL (ref 1.7–7.7)
Neutro Abs: 2 10*3/uL (ref 1.7–7.7)
Neutro Abs: 2.2 10*3/uL (ref 1.7–7.7)
Neutro Abs: 2.7 10*3/uL (ref 1.7–7.7)
Neutro Abs: 4.1 10*3/uL (ref 1.7–7.7)
Neutro Abs: 6.4 10*3/uL (ref 1.7–7.7)
Neutro Abs: 4 10*3/uL (ref 1.7–7.7)
Neutro Abs: 6 10*3/uL (ref 1.7–7.7)
Neutrophils Relative %: 48 % (ref 43–77)
Neutrophils Relative %: 56 % (ref 43–77)
Neutrophils Relative %: 61 % (ref 43–77)
Neutrophils Relative %: 62 % (ref 43–77)
Neutrophils Relative %: 70 % (ref 43–77)
Neutrophils Relative %: 88 % — ABNORMAL HIGH (ref 43–77)
Neutrophils Relative %: 82 % — ABNORMAL HIGH (ref 43–77)
Neutrophils Relative %: 83 % — ABNORMAL HIGH (ref 43–77)
Promyelocytes Absolute: 0 %
Smear Review: ADEQUATE
nRBC: 0 /100 WBC

## 2010-11-22 LAB — GLUCOSE, CAPILLARY
Glucose-Capillary: 102 mg/dL — ABNORMAL HIGH (ref 70–99)
Glucose-Capillary: 104 mg/dL — ABNORMAL HIGH (ref 70–99)
Glucose-Capillary: 104 mg/dL — ABNORMAL HIGH (ref 70–99)
Glucose-Capillary: 110 mg/dL — ABNORMAL HIGH (ref 70–99)
Glucose-Capillary: 118 mg/dL — ABNORMAL HIGH (ref 70–99)
Glucose-Capillary: 132 mg/dL — ABNORMAL HIGH (ref 70–99)
Glucose-Capillary: 150 mg/dL — ABNORMAL HIGH (ref 70–99)
Glucose-Capillary: 159 mg/dL — ABNORMAL HIGH (ref 70–99)
Glucose-Capillary: 169 mg/dL — ABNORMAL HIGH (ref 70–99)
Glucose-Capillary: 235 mg/dL — ABNORMAL HIGH (ref 70–99)
Glucose-Capillary: 51 mg/dL — ABNORMAL LOW (ref 70–99)
Glucose-Capillary: 59 mg/dL — ABNORMAL LOW (ref 70–99)
Glucose-Capillary: 70 mg/dL (ref 70–99)
Glucose-Capillary: 73 mg/dL (ref 70–99)
Glucose-Capillary: 76 mg/dL (ref 70–99)
Glucose-Capillary: 78 mg/dL (ref 70–99)
Glucose-Capillary: 81 mg/dL (ref 70–99)
Glucose-Capillary: 87 mg/dL (ref 70–99)
Glucose-Capillary: 88 mg/dL (ref 70–99)
Glucose-Capillary: 88 mg/dL (ref 70–99)
Glucose-Capillary: 91 mg/dL (ref 70–99)
Glucose-Capillary: 91 mg/dL (ref 70–99)
Glucose-Capillary: 93 mg/dL (ref 70–99)
Glucose-Capillary: 94 mg/dL (ref 70–99)
Glucose-Capillary: 99 mg/dL (ref 70–99)
Glucose-Capillary: 104 mg/dL — ABNORMAL HIGH (ref 70–99)
Glucose-Capillary: 104 mg/dL — ABNORMAL HIGH (ref 70–99)
Glucose-Capillary: 106 mg/dL — ABNORMAL HIGH (ref 70–99)
Glucose-Capillary: 115 mg/dL — ABNORMAL HIGH (ref 70–99)
Glucose-Capillary: 115 mg/dL — ABNORMAL HIGH (ref 70–99)
Glucose-Capillary: 116 mg/dL — ABNORMAL HIGH (ref 70–99)
Glucose-Capillary: 125 mg/dL — ABNORMAL HIGH (ref 70–99)
Glucose-Capillary: 126 mg/dL — ABNORMAL HIGH (ref 70–99)
Glucose-Capillary: 127 mg/dL — ABNORMAL HIGH (ref 70–99)
Glucose-Capillary: 141 mg/dL — ABNORMAL HIGH (ref 70–99)
Glucose-Capillary: 144 mg/dL — ABNORMAL HIGH (ref 70–99)
Glucose-Capillary: 157 mg/dL — ABNORMAL HIGH (ref 70–99)
Glucose-Capillary: 164 mg/dL — ABNORMAL HIGH (ref 70–99)
Glucose-Capillary: 169 mg/dL — ABNORMAL HIGH (ref 70–99)
Glucose-Capillary: 180 mg/dL — ABNORMAL HIGH (ref 70–99)
Glucose-Capillary: 187 mg/dL — ABNORMAL HIGH (ref 70–99)
Glucose-Capillary: 69 mg/dL — ABNORMAL LOW (ref 70–99)
Glucose-Capillary: 79 mg/dL (ref 70–99)
Glucose-Capillary: 79 mg/dL (ref 70–99)
Glucose-Capillary: 80 mg/dL (ref 70–99)
Glucose-Capillary: 81 mg/dL (ref 70–99)
Glucose-Capillary: 93 mg/dL (ref 70–99)
Glucose-Capillary: 93 mg/dL (ref 70–99)
Glucose-Capillary: 99 mg/dL (ref 70–99)

## 2010-11-22 LAB — FUNGUS CULTURE W SMEAR: Fungal Smear: NONE SEEN

## 2010-11-22 LAB — ETHANOL: Alcohol, Ethyl (B): <5 mg/dL (ref 0–10)

## 2010-11-22 LAB — URINALYSIS, MICROSCOPIC ONLY
Glucose, UA: NEGATIVE mg/dL
Ketones, ur: NEGATIVE mg/dL
Leukocytes, UA: NEGATIVE
Nitrite: NEGATIVE
Protein, ur: 30 mg/dL — AB
Specific Gravity, Urine: 1.023 (ref 1.005–1.030)
Urobilinogen, UA: 0.2 mg/dL (ref 0.0–1.0)
pH: 6 (ref 5.0–8.0)

## 2010-11-22 LAB — CULTURE, BLOOD (ROUTINE X 2)
Culture: NO GROWTH
Culture: NO GROWTH
Culture: NO GROWTH
Culture: NO GROWTH

## 2010-11-22 LAB — MAGNESIUM
Magnesium: 1.4 mg/dL — ABNORMAL LOW (ref 1.5–2.5)
Magnesium: 1.4 mg/dL — ABNORMAL LOW (ref 1.5–2.5)
Magnesium: 1.6 mg/dL (ref 1.5–2.5)
Magnesium: 1.7 mg/dL (ref 1.5–2.5)
Magnesium: 1.7 mg/dL (ref 1.5–2.5)
Magnesium: 1.8 mg/dL (ref 1.5–2.5)
Magnesium: 1.8 mg/dL (ref 1.5–2.5)
Magnesium: 1.9 mg/dL (ref 1.5–2.5)
Magnesium: 1.4 mg/dL — ABNORMAL LOW (ref 1.5–2.5)
Magnesium: 1.5 mg/dL (ref 1.5–2.5)
Magnesium: 1.9 mg/dL (ref 1.5–2.5)

## 2010-11-22 LAB — CSF CELL COUNT WITH DIFFERENTIAL
Eosinophils, CSF: 0 % (ref 0–1)
Eosinophils, CSF: 2 % — ABNORMAL HIGH (ref 0–1)
Lymphs, CSF: 58 % (ref 40–80)
Lymphs, CSF: 28 % — ABNORMAL LOW (ref 40–80)
Monocyte-Macrophage-Spinal Fluid: 20 % (ref 15–45)
Monocyte-Macrophage-Spinal Fluid: 3 % — ABNORMAL LOW (ref 15–45)
Other Cells, CSF: 0
RBC Count, CSF: 1 /mm3 — ABNORMAL HIGH
RBC Count, CSF: 151 /mm3 — ABNORMAL HIGH
Segmented Neutrophils-CSF: 22 % — ABNORMAL HIGH (ref 0–6)
Segmented Neutrophils-CSF: 67 % — ABNORMAL HIGH (ref 0–6)
Tube #: 1
Tube #: 1
WBC, CSF: 73 /mm3 — ABNORMAL HIGH (ref 0–5)
WBC, CSF: 44 /mm3 — ABNORMAL HIGH (ref 0–5)

## 2010-11-22 LAB — CARDIAC PANEL(CRET KIN+CKTOT+MB+TROPI)
CK, MB: 0.4 ng/mL (ref 0.3–4.0)
CK, MB: 0.4 ng/mL (ref 0.3–4.0)
CK, MB: 0.4 ng/mL (ref 0.3–4.0)
Relative Index: INVALID (ref 0.0–2.5)
Relative Index: INVALID (ref 0.0–2.5)
Relative Index: INVALID (ref 0.0–2.5)
Total CK: 18 U/L (ref 7–232)
Total CK: 18 U/L (ref 7–232)
Total CK: 32 U/L (ref 7–232)
Troponin I: <0.01 ng/mL (ref 0.00–0.06)
Troponin I: <0.01 ng/mL (ref 0.00–0.06)
Troponin I: <0.01 ng/mL (ref 0.00–0.06)

## 2010-11-22 LAB — PHOSPHORUS
Phosphorus: 3 mg/dL (ref 2.3–4.6)
Phosphorus: 3.6 mg/dL (ref 2.3–4.6)
Phosphorus: 3.2 mg/dL (ref 2.3–4.6)
Phosphorus: 3.2 mg/dL (ref 2.3–4.6)
Phosphorus: 3.4 mg/dL (ref 2.3–4.6)

## 2010-11-22 LAB — CSF CULTURE W GRAM STAIN: Culture: NO GROWTH

## 2010-11-22 LAB — URINE CULTURE: Colony Count: 10000

## 2010-11-22 LAB — T-HELPER CELLS (CD4) COUNT (NOT AT ARMC)
CD4 % Helper T Cell: 6 % — ABNORMAL LOW (ref 33–55)
CD4 T Cell Abs: 40 uL — ABNORMAL LOW (ref 400–2700)

## 2010-11-22 LAB — RAPID URINE DRUG SCREEN, HOSP PERFORMED
Amphetamines: NOT DETECTED
Barbiturates: NOT DETECTED
Benzodiazepines: NOT DETECTED
Cocaine: POSITIVE — AB
Opiates: NOT DETECTED
Tetrahydrocannabinol: NOT DETECTED

## 2010-11-22 LAB — CSF CULTURE
Culture: NO GROWTH
Gram Stain: NONE SEEN

## 2010-11-22 LAB — HIV-1 GENOTYPR PLUS: Resistance Assoc RT Mutations: NOT DETECTED

## 2010-11-22 LAB — HIV-1 RNA QUANT-NO REFLEX-BLD
HIV 1 RNA Quant: 622 copies/mL — ABNORMAL HIGH (ref <48)
HIV-1 RNA Quant, Log: 2.79 {Log} — ABNORMAL HIGH (ref <1.68)

## 2010-11-22 LAB — TESTOSTERONE: Testosterone: 16.09 ng/dL — ABNORMAL LOW (ref 350–890)

## 2010-11-22 LAB — PROTEIN AND GLUCOSE, CSF
Glucose, CSF: 11 mg/dL — ABNORMAL LOW (ref 43–76)
Total  Protein, CSF: 153 mg/dL — ABNORMAL HIGH (ref 15–45)

## 2010-11-22 LAB — LACTIC ACID, PLASMA: Lactic Acid, Venous: 1 mmol/L (ref 0.5–2.2)

## 2010-11-22 LAB — CK TOTAL AND CKMB (NOT AT ARMC)
CK, MB: 0.4 ng/mL (ref 0.3–4.0)
Relative Index: INVALID (ref 0.0–2.5)
Total CK: 53 U/L (ref 7–232)

## 2010-11-22 LAB — OSMOLALITY: Osmolality: 260 mOsm/kg — ABNORMAL LOW (ref 275–300)

## 2010-11-22 LAB — TROPONIN I: Troponin I: <0.01 ng/mL (ref 0.00–0.06)

## 2010-11-22 LAB — HIV-1 RNA ULTRAQUANT REFLEX TO GENTYP+
HIV 1 RNA Quant: 52100 copies/mL — ABNORMAL HIGH (ref <48)
HIV-1 RNA Quant, Log: 4.72 {Log} — ABNORMAL HIGH (ref <1.68)

## 2010-11-22 LAB — CRYPTOCOCCAL ANTIGEN, CSF: Cryptococcal Ag Titer: 32 — AB

## 2010-11-22 LAB — LIPASE, BLOOD: Lipase: 20 U/L (ref 11–59)

## 2010-12-15 ENCOUNTER — Telehealth: Payer: Self-pay | Admitting: *Deleted

## 2010-12-15 ENCOUNTER — Other Ambulatory Visit: Payer: Self-pay | Admitting: *Deleted

## 2010-12-15 ENCOUNTER — Ambulatory Visit (INDEPENDENT_AMBULATORY_CARE_PROVIDER_SITE_OTHER): Payer: Medicaid Other | Admitting: Infectious Diseases

## 2010-12-15 ENCOUNTER — Ambulatory Visit: Payer: Self-pay

## 2010-12-15 ENCOUNTER — Other Ambulatory Visit: Payer: Self-pay

## 2010-12-15 DIAGNOSIS — B20 Human immunodeficiency virus [HIV] disease: Secondary | ICD-10-CM

## 2010-12-15 DIAGNOSIS — K645 Perianal venous thrombosis: Secondary | ICD-10-CM

## 2010-12-15 MED ORDER — HYDROCORTISONE ACETATE 25 MG RE SUPP
25.0000 mg | Freq: Two times a day (BID) | RECTAL | Status: DC
Start: 2010-12-15 — End: 2010-12-15

## 2010-12-15 MED ORDER — HYDROCORTISONE ACETATE 25 MG RE SUPP: 25.0000 mg | Freq: Two times a day (BID) | RECTAL | Status: AC

## 2010-12-15 NOTE — Progress Notes (Signed)
  Subjective:    Patient ID: [name], male    DOB: [birth year], 55 y.o.   MRN: [id number]  HPI 55 yo M with HIV+ and Hep C . Last CD4 190 and VL <20 (05-16-10). He states he has been taking his meds well. Walks into clinic today c/o rectal pain.    Review of Systems     Objective:   Physical Exam  Constitutional: He appears well-developed and well-nourished.  Abdominal:            Assessment & Plan:

## 2010-12-15 NOTE — Assessment & Plan Note (Signed)
Appears to be doing well. Has lab recheck next month and f/u with Dr Daiva Eves afterwards. Offered condoms.

## 2010-12-15 NOTE — Assessment & Plan Note (Signed)
Suspect that he has thrombosed internal hemmorrhoid. Will start him on proctofoam and have him seen by surgery. He would need a more detailed internal exam than he can tolerate today.

## 2010-12-15 NOTE — Telephone Encounter (Signed)
Patient has appt. Scheduled at [facility] for 12/16/10 at 9:00 AM, with Dr. Carolynne Edouard.  Patient aware of appt. And pts. Home care nurse Bluford Kaufmann also notified.  Bjorn Loser will pick up pts. RX for Anusol at [place] on [address] and deliver to patient. Wendall Mola CMA

## 2010-12-20 NOTE — Consult Note (Signed)
[name], [name]                 ACCOUNT NO.:  [id number]   MEDICAL RECORD NO.:  [id number]          PATIENT TYPE:  INP   LOCATION:  5504                         FACILITY:  [facility]   PHYSICIAN:  Noralyn Pick. Eden Emms, MD, FACCDATE OF BIRTH:  05/15/1956   DATE OF CONSULTATION:  DATE OF DISCHARGE:                                 CONSULTATION   INDICATIONS:  A 55 year old patient with positive fungal cultures and  HIV.   The patient was sedated with 75 mcg of fentanyl and 6 mg of Versed.   Using digital technique, an Omniplane probe was advanced into the distal  esophagus without incident.  Transgastric imaging revealed mild LVH with  mild left ventricular cavity enlargement and EF was 60%.  There were no  regional wall motion abnormalities.  Mitral valve was structurally  normal with trivial MR.  Aortic valve was trileaflet with mild aortic  insufficiency.  Tricuspid and pulmonary valves were normal.  There was  no evidence of SBE or vegetations.  Imaging of the atrial septum showed  it to be intact with no PFO.  Imaging of the left atrial appendage  showed no clot.  Imaging of the aorta showed no significant debris.   FINAL IMPRESSION:  1. No evidence of subacute bacterial endocarditis or vegetations.  2. Mild aortic insufficiency.  3. Trivial mitral insufficiency.  4. Normal tricuspid and pulmonary valves.  5. Mild left ventricular cavity enlargement and left ventricular      hypertrophy, ejection fraction 60% .  6. Normal right-sided cardiac chambers.  7. No patent foramen ovale.  8. No left atrial appendage clot.  9. No aortic debris.   The patient tolerated the procedure well.      Noralyn Pick. Eden Emms, MD, [facility]  Electronically Signed     PCN/MEDQ  D:  09/02/2008  T:  09/02/2008  Job:  [id number]

## 2010-12-20 NOTE — Discharge Summary (Signed)
[name], [name] NO.:  [id number]   MEDICAL RECORD NO.:  [id number]          PATIENT TYPE:  INP   LOCATION:  5504                         FACILITY:  [facility]   PHYSICIAN:  Acey Lav, MD  DATE OF BIRTH:  22-Jun-1956   DATE OF ADMISSION:  08/27/2008  DATE OF DISCHARGE:  09/12/2008                               DISCHARGE SUMMARY   DISCHARGE DIAGNOSES:  1. Cryptococcal fungemia with cryptococcal meningitis  2. Human immunodeficiency virus.  3. Hepatitis C.  4. Chest pain.  5. Renal insufficiency.  6. Leukopenia.  7. Esophagitis.  8. Drug abuse.   DISCHARGE MEDICATIONS:  1. Azithromycin 1200 mg by mouth every Thursday.  2. Darunavir 800 mg by mouth daily.  3. Truvada 1 tablet by mouth daily.  4. Ritonavir 100 mg by mouth daily.  5. Bactrim DS 1 tablet by mouth daily.  6. Fluconazole 800 mg by mouth daily for 1 week, fluconazole 400 mg by      mouth daily for 2 months, fluconazole 200 mg by mouth daily until      instructed by his physician to stop.  7. Pantoprazole 40 mg by mouth daily.   DISPOSITION AND FOLLOWUP:  The nature of the patient's medical  conditions and the patient's medications were discussed with the patient  and the patient expressed understanding.  The patient was advised that  the appropriate treatment for cryptococcal meningitis is at least 2  weeks of amphotericin B plus flucytosine.  The patient insisted on  leaving after 6 days of amphotericin treatment.  He was transitioned to  oral fluconazole.  Given his renal insufficiency, he was instructed to  return to the outpatient clinic on September 15, 2008, for labs.  A 1-  month supply of his HIV medications, Bactrim, azithromycin, and  fluconazole was obtained for him to take home.  Followup was scheduled  with Dr. Daiva Eves on September 28, 2008, at 3:30 p.m.  The patient was  told to seek immediate medical attention for any worsening of his  symptoms.   PROCEDURE PERFORMED:  1.  CT head without contrast:  No acute process.  2. Chest x-ray:  Persistent interstitial opacities concerning for      atypical infection.  3. CD4 count:  40.  4. HIV RNA:  52,100.  5. HIV genotype:  No evidence of resistance.  6. HCV RNA:  8,520,000.  7. RPR:  NR.  8. TSH:  0.8.  9. Culture CSF:  Cryptococcus neoformans.  10.Blood culture:  Cryptococcus neoformans.   HISTORY AND PHYSICAL:  This is a 55 year old male with a past medical  history of HIV, presented to the emergency department on August 27, 2008, complaining of cough, chest pain, and fever.  He had previously  been treated for atypical pneumonia with Avelox and Septra.  He also  reported a 15-pound weight loss in the past month and 1 episode of  syncope with loss of consciousness.  He was also complaining at that  time of 3 weeks of sore throat and dysphagia.  He had not been taking  any medications for HIV for the past 2 years.   PHYSICAL EXAMINATION:  VITAL SIGNS:  Temperature:  101.5.  Blood  pressure:  150/85.  Pulse:  105.  SpO2:  96% on 4L.  GENERAL:  Thin, in no acute distress.  EYES:  Anicteric.  ENT:  Oropharynx with diffuse white plaques.  NECK:  No meningeal signs.  RESP:  Decreased breath sounds bilaterally.  CV:  RRR.  No M/R/G.  GI:  Soft, ND, mild tenderness.  SKIN:  No rash.  NEUROLOGIC:  AAO x3, CN II-XII intact, nonfocal.   HOSPITAL COURSE:  1. Cryptococcal fungemia:  On admission, the patient was felt to have      atypical pneumonia.  He was treated with broad-spectrum      antibiotics.  After admission, the patient developed headaches.  A      lumbar puncture was performed.  The patient's blood and CSF      cultures were both positive for Cryptococcus neoformans.  He was      initially treated with 4 days of fluconazole 400 mg.  The patient      was started on amphotericin B 50 mg IV daily on September 06, 2008.      He also received flucytosine 1500 mg p.o. q.i.d.  He received a      total of  6 days of both of these medications.  Despite being      advised with proper treatment course of 2 weeks of amphotericin,      the patient insisted leaving after 6 days of treatment.  The      patient appears to have good response to the medications and was      without headache, shortness of breath, or chest pain on the day of      discharge. We felt comfortable changing him to high dose      fluconazole to complete induction phase with 800mg  per day,      followed by induction with 400mg  fluconazole and eventually 200mg       per day of fluconazole.  2. HIV:  Upon admission, the patient had a CD4 count of 40.  Under the      direction of Dr. Daiva Eves, the patient was started on antiretroviral      medication.  He appeared to tolerate his antiretroviral medication,      and at the time of discharge, there were no signs or symptoms to      suggest IRIS.  3. Esophagitis:  The patient complained of dysphagia on admission.  He      had white plaques in his oropharynx consistent with thrush.  He was      treated with antifungal medications and experienced resolution of      his symptoms.  4. Hepatitis C:  The patient had an HCV RNA of 8,520,000.  During this      admission, he had mild elevation of his AST.  5. Chest pain:  On day of admission, the patient complained of chest      pain, which was felt to be secondary to .  On September 09, 2008, the      patient had a 5-minute episode of central chest burning with      radiation down both arms and into his face.  He also complained of      shortness of breath at that time.  He denied diaphoresis.  An EKG      taken at  that time was unchanged from previous.  He had cycle      cardiac enzymes, which were negative x3.  The patient's symptoms      resolved and he complained no more chest pain or shortness of      breath for the remainder of his hospital course.  The etiology of      his chest pain is unknown.  6. Renal insufficiency:  On the day of  discharge, the patient's      creatinine was 1.61, which was elevated compared to admission.  It      is felt that his renal insufficiency is most likely secondary to      medication.  The patient was given IV fluids and instructed to      present to the outpatient clinic on September 15, 2008, for lab work.  7. Leukopenia.  On September 10, 2008, the patient developed a      leukopenia with the white blood cell count of 3.9.  It was felt      that the patient's leukopenia was most likely secondary to      flucytosine.  His flucytosine dose was reduced.  On the day of      discharge, his white blood cell count was 3.9.   DISCHARGE VITALS:  T:  98.9.  BP:  118/68,  P:  74.  R:  16.  SpO2:  100  RA.   DISCHARGE LABORATORY DATA:  WBC :  3.9.  Hb:  10.1.  Platelets:  342.  K:  4.3.  CR:  1.61.  AST:  41.  Mg:  1.7.     ______________________________  Marlana Salvage, MS      Acey Lav, MD  Electronically Signed    RT/MEDQ  D:  09/12/2008  T:  09/13/2008  Job:  [id number]   cc:   Outpatient Clinic  Acey Lav, MD

## 2010-12-20 NOTE — Discharge Summary (Signed)
[name], [name] NO.:  [id number]   MEDICAL RECORD NO.:  [id number]          PATIENT TYPE:  INP   LOCATION:  5126                         FACILITY:  [facility]   PHYSICIAN:  Acey Lav, MD  DATE OF BIRTH:  10-31-1955   DATE OF ADMISSION:  09/17/2008  DATE OF DISCHARGE:  09/25/2008                               DISCHARGE SUMMARY   DICTATED BY:  Marlana Salvage, Medical Student.   DISCHARGE DIAGNOSES:  1. 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  2. Human immunodeficiency virus.  3. Hypogonadism.  4. Altered mental status, unspecified.  5. Hyponatremia.  6. Acute renal failure, likely medication induced.  7. Leukopenia.  8. Diarrhea.  9. Hepatitis C.  10.Cocaine abuse.   DISCHARGE MEDICATIONS:  1. Azithromycin 1200 mg by mouth every Thursday.  2. Bactrim DS 1 tab by mouth daily.  3. Megestrol acetate 800 mg by mouth daily.  4. Pantoprazole 40 mg by mouth daily.  5. Darunavir 800 mg by mouth daily.  6. Truvada 1 tab by mouth daily.  7. Ritonavir 100 mg by mouth daily.  8. Prednisone 60 mg by mouth daily for 12 days.  9. Fluconazole 800 mg by mouth daily for 4 days, then fluconazole 400      mg by mouth daily for 2 months, then fluconazole 200 mg by mouth      daily until instructed to stop by a physician.  10.Testosterone 200 mg IM every 2 weeks.  11.Lomotil 1-2 tabs by mouth every 6-12 hours as needed for diarrhea.   DISPOSITION AND FOLLOWUP:  The patient's medications and the nature of  the patient's medical conditions were discussed with the patient, and  the patient expressed understanding.  The patient was instructed to  please return or seek medical attention for any worsening of his  symptoms or headache.  The patient has a followup appointment scheduled  with Dr. Gwen Her Dam in the [facility] on September 28, 2008, at 3:30.  The patient has an additional followup appointment  with Dr. Gwen Her Dam at the [facility] on October 14, 2008, at 4 p.m.   PROCEDURES PERFORMED:  1. CT head September 18, 2008:  No acute intracranial abnormality.  2. CT head September 19, 2008:  No acute intracranial abnormality.  3. Lumbar puncture September 19, 2008:  Opening pressure 27, closing      pressure 19.  4. Lumbar puncture September 20, 2008.  5. Lumbar puncture September 21, 2008.  6. Lumbar puncture September 22, 2008:  Opening pressure 19, closing      pressure 13.  7. Abdominal x-ray September 18, 2008:  Normal bowel gas pattern.   CONSULTATIONS:  No consultations were obtained during this  hospitalization.   HISTORY AND PHYSICAL:  A 55 year old male presented on September 18, 2008, complaining of diffuse 10/10 headache, subjective fever, emesis,  neck stiffness, and left eye ptosis that started 4 days prior to  admission.  The patient had previously been hospitalized at [facility] from August 27, 2008, to September 12, 2008, with cryptococcal  meningitis.  He received 6 days of amphotericin B and flucytosine  therapy during that hospitalization, and was discharged home on  induction therapy fluconazole 800 mg daily for 1 week and then induction  fluconazole 400 mg daily for 2 months.  He was discharged with a 53-month  supply of his fluconazole and antiretroviral medications.  He endorsed  taking all medications as prescribed after discharge.  His HIV  antiretroviral medications had been started during the last  hospitalization on September 09, 2008.  He described his headache as being  diffuse, like needles, and 10/10 in severity.  He also endorsed  photophobia.  The patient reported 2 episodes of emesis and reported  that his p.o. intake had been very poor for the previous 2 days.   PHYSICAL:  VITAL SIGNS:  T:  99.2, BP:  144/83, P:  67, RR:  18, SPO2:  99 RA.  GENERAL:  Thin, resting with eyes closed, fully responsive.  EYES:  Right pupil 2 mm reactive,  left pupil 1 mm reactive, left eyelid  ptosis, anicteric.  ENT:  No lymphadenopathy.  NECK:  Tender and stiff, positive Kernig sign, positive Brudzinski sign.  RESP:  CTAB, no W/R/R.  CV:  RRR, 3/6 murmur, no R/G.  GI:  Soft, NT, ND, normoactive bowel sounds.  SKIN:  No rash.  MS:  Strength 4+/5 symmetrical hand, biceps, triceps, hip, knee, ankle.  NEURO:  AAO x3, anisocoria, point-to-point intact, tongue midline,  facial sensation intact, sensation intact to light touch throughout, the  patient grossly intact, EOMI.  PSYCH:  Appropriate.   ADMITTING LABS:  Na:  131, K:  3.7, CO:  97, bicarb:  26, BUN:  18, CR:  1.85, glucose:  84, bilirubin: 0.8, alk phos: 100, AST:  34, ALT:  30,  protein: 8.7, albumin:  3, calcium:  9.4, magnesium:  1.7.  WBC:  3.8,  HB:  4.5, HCT:  36.6, platelets:  286, ANC:  2, MCV:  83.8.  Lipase:  20, lactic acid:  1.  Cardiac enzymes:  Negative.  UA:  Trace blood,  small bilirubin, specific gravity 1.023.   HOSPITAL COURSE BY PROBLEM:  1. 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  The patient had been treated during that      hospitalization with 6 days of amphotericin B and flucytosine.  The      patient has desired to go home and the physicians had felt      comfortable sending him home on induction therapy fluconazole 800      mg daily for 1 week and then induction therapy fluconazole 400 mg      daily for 2 months.  The patient went home with a 64-month supply of      fluconazole and antiretroviral medications.  He endorsed taking all      medications; however, 2 days after that discharge, he developed      headache, chills, nausea, and neck stiffness.  His symptoms      gradually progressed and he decided to present to [facility]      [facility]gency Department on September 18, 2008.   Given his recent      history of cryptococcal meningitis and his low CD4 count,  there was      concern for infectious meningitis.  Because the patient had      recently been started on antiretroviral medications, there was also      concern for immune reconstitution syndrome.  His antiretroviral      medications had been started during his previous hospitalization on      September 09, 2008.  The patient had blood cultures drawn on      admission, which would have no growth on them.  The patient was      treated with fluconazole on the day of admission, but was switched      to amphotericin B and flucytosine on the day after admission.  He      had a lumbar puncture on the day after admission, which was      September 19, 2008.  The lumbar puncture was prior to him receiving      amphotericin.  The lumbar puncture had an elevated opening pressure      of 27.  The CSF was sent for cultures, cell count and differential,      and cryptococcal antigen.  The cryptococcal antigen was positive as      was to be expected based on the patient's recent hospitalization      for cryptococcal meningitis.  The cryptococcal titer from February      13 was 32.  The cryptococcal titer on the CSF during his previous      hospitalization on January 29 had been greater than 512.  The      patient's fungal CSF culture from February 13 would eventually show      no growth.  The patient's CSF culture from February 13, would also      show no growth.  The cell count and differential on the CSF from      February 13 revealed WBC 73, RBC 1, neutrophil 22, lymphocyte 58,      monocyte 20, eosinophil 0.  The protein from the CSF on February 13      was elevated at 153, the glucose from the CSF on February 13 was      decreased at 11.  Because of the patient's elevated opening      pressure and the patient's continued headache, the patient      eventually had 3 additional lumbar punctures over the next 3 days.      The  last lumbar puncture was on February 16, which revealed an      opening pressure of 19 and a closing pressure of 13.  Over the      course of hospitalization, the patient's headache gradually      improved from being 10/10 on admission to the point that the      patient denied having headache at the time of discharge.  The      patient had occasional fevers during the hospitalization.  The      highest fever was 103.  By the time of discharge, the patient was      afebrile.  Because of the patient's previous course of treatment      and the negative CSF cultures, the patient's symptoms were felt to      be most likely due to immune reconstitution syndrome.  On September 23, 2008, the patient was restarted on antiretroviral medications.      Because of the concern for immune reconstitution syndrome, the  patient was also started on prednisone 60 mg daily and was      instructed that he would likely need a 2-week course of prednisone      at which time his outpatient ID MD will attemnpt to taper this--      though he may require long term therapy with it..  The patient      appeared to be tolerating his antiretroviral medications well.  He      denied having any headache at the time of discharge.  He was also      ambulating appropriately and had an increased appetite by the time      of discharge.  It was felt that it was safe for the patient to be      discharged, discharged with close followup.  The patient was      instructed to return or seek medical attention for any return of      his headache.  2. HIV:  The patient had a CD4 count of 40 on August 27, 2008.  On      September 21, 2008, his CD4 count was 40 with 6% T-helper cells.  On      September 07, 2008, the patient's HIV RNA was 52,100, log 4.72.  On      September 21, 2008, the patient's HIV RNA was 622, log 2.79.  The      patient had had antiretroviral medications started on February 3.      The antiretroviral  medications were stopped at the discharge on      September 18, 2008.  The antiretroviral medications were restarted      on September 23, 2008, along with prednisone 60 mg daily.  On      August 29, 2008, the patient had an HIV genotype which revealed no      evidence of any resistance.  Followup was scheduled with Dr. Daiva Eves who is an infectious disease specialist.  3. Hypogonadism:  Because of the patient's cachectic appearance, there      was concern that the patient might have a decreased testosterone      level.  A testosterone level was obtained which was very low at      16.09.  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  The patient received testosterone cypionate 200 mg IM      on September 24, 2008.  The patient was instructed that he would      need to receive testosterone injections every 2 weeks.  Paulo Fruit was attempting to enroll the patient in pharmaceutical      assistance at the time of discharge.  There is hope that the      patient's testosterone may improve once the patient receives      treatment for his HIV.  4. Altered mental status, unspecified:  On admission, the patient was      fully responsive.  On September 19, 2008, the patient had an episode      were he was found to be poorly responsive.  A head CT was obtained,      which demonstrated no acute intracranial abnormalities.  On      September 20, 2008, the patient was lethargic and oriented x2.  He      had a sodium of 124 at that time.  Over the  course of the following      days, the patient became more alert and interactive.  However on      September 21, 2008, he reported that he had had an episode the      previous night where he felt that he was in an abandoned hospital.      He reported seeing cobwebs in his rooms.  These appear to have been      frank visual hallucinations.  He had several more episodes in which      he reported seeing cobwebs in his room.   Besides these episodes,      the patient was also sleepy during this time.  The patient's      flucytosine was stopped due to concern that it could be a cause of      his hallucinations, but the hallucinations persisted.  The patient      was not receiving narcotics or Benadryl at this time.  The patient      was hyponatremic, however, his hallucinations persisted despite      resolution of his hyponatremia.  It is unclear if his altered      mental status was a manifestation of his aseptic meningitis.  There      was also concern that the patient may have been flipping his nights      and days.  The patient's hallucinations were occasional and he had      approximately 1 episode of hallucinations per day.  Of note, the      hallucinations started before the patient began to receive      prednisone.  TSH 0.889.  The patient has previously endorsed      suicide attempts in the past involving overdosing on medications.      He denied any suicidal ideation or homicidal ideation during this      hospitalization.  5. Hyponatremia.  On September 20, 2008, the patient had a sodium of      124.  There was initial concern that this hyponatremia could be      responsible for the patient's altered mental status; however, the      patient's hallucinations persisted even after the sodium improved.      The patient's serum osmolality was 260.  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  The patient was gently hydrated with normal saline IV      fluids, and hyponatremia gradually resolved over the course of the      hospitalization.  At the time of discharge, the patient had a      sodium of 135.  6. Acute renal failure:  On review of the patient's record, the      patient appears to have a baseline creatinine of approximately 0.7      to 0.9.  During the patient's previous hospitalization for      cryptococcal meningitis, the patient has had  an elevated creatinine      after receiving amphotericin B.  At the time of admission, the      patient's creatinine was 1.85.  At that time, the patient was      endorsing multiple episodes of emesis and poor p.o. intake over the      previous 2 days.  He received IV fluids and his creatinine was 1.36     on September 19, 2008.  On February 13, amphotericin B was started.      On February 15, the patient had a creatinine of 2.14.  The patient      was given IV fluids and the amphotericin was continued until      February 17.  After the amphotericin was discontinued, the      patient's creatinine gradually decreased.  The patient's creatinine      was 1.02 at discharge.  It was felt that the patient's acute renal      failure was likely secondary to amphotericin B.  7. Leukopenia.  The patient had developed leukopenia during his      previous hospitalization.  During his previous hospitalization, it      was felt that his leukopenia could be secondary to flucytosine.  On      admission for this hospitalization, the patient had a WBC of 3.8.      The patient's white blood cell count normalized and was 7.4 at the      time of discharge.  8. Diarrhea:  The patient was complaining of diarrhea and fecal      urgency, which began prior to this hospitalization.  He did not      have any blood in the stool.  He was instructed to take Lomotil as      needed for diarrhea.   DISCHARGE LAB AND VITALS:  T:  98.5, SPP:  152, DVP:  90, P:  54, R:  18, SPO2:  98 room air, Na:  135, K:  3.9, CO:  110, bicarb:  16, BUN:  12, CR:  1.02, glucose:  127, T bili:  0.5, alk phos:  73, AST:  19,  ALT:  11, protein:  6.6, albumin:  3.2, CA:  8.5, WBC:  7.4, HB:  9.9,  HCT:  28.8, platelets:  302, MCV:  85.2, ANC:  6.    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 He  may require  prolonged steroids, though I will try to taper him at his next appt in 2  weeks.      Jason Coop, MD  Electronically Signed      Acey Lav, MD  Electronically Signed    YP/MEDQ  D:  09/25/2008  T:  09/26/2008  Job:  [id number]   cc:   Outpatient Clinic

## 2010-12-23 NOTE — Discharge Summary (Signed)
[name], [name]                 ACCOUNT NO.:  [id number]   MEDICAL RECORD NO.:  [id number]          PATIENT TYPE:  INP   LOCATION:  6708                         FACILITY:  [facility]   PHYSICIAN:  Madaline Guthrie, M.D.    DATE OF BIRTH:  Dec 12, 1955   DATE OF ADMISSION:  10/19/2008  DATE OF DISCHARGE:  10/25/2008                               DISCHARGE SUMMARY   DISCHARGE DIAGNOSES/REASON FOR ADMISSION:  1. Cryptococcal meningitis.  2. Pneumonia, suspicious for Pneumocystis carinii pneumonia.  3. Human immunodeficiency virus/acquired immune deficiency syndrome.  4. Hepatitis C.  5. Substance abuse.  6. Tobacco abuse.   DISCHARGE MEDICATIONS WITH ACCURATE DOSAGES:  1. Prezista 400 mg, take 2 tablets a day with Norvir and Truvada.  2. Norvir 100 mg 1 tablet daily.  3. Truvada 200-300 mg 1 tablet daily.  4. Bactrim 800 mg, take 3 tablets 2 times a day for 3 weeks and go      back to 3 times a week as before.  5. Zithromax 600 mg, take 2 tablets per week.  6. Fluconazole 200 mg, take 2 pills a day for 2 months and then 1 pill      a day after that.  7. Protonix 40 mg 1 pill daily.  8. Prednisone 5 mg, take 2 pills a day for 7 days and 1 pill a day for      3 days, then 1 pill every other day for 3 doses, then stop.   DISPOSITION AND FOLLOWUP:  The patient is to call clinic here at [facility] for an appointment, the phone number is [phone].   PROCEDURES PERFORMED:  Upon admission, the patient underwent a lumbar  puncture so as to assess a cerebrospinal fluid took for evidence of  meningitis.  Additional tests that were undergone include EKG, chest x-  ray, and CT of the head.   CONSULTATION DURING THIS HOSPITAL STAY:  The Infectious Disease Team was  consulted for their recommendations concerning [name].   BRIEF HISTORY OF PRESENT ILLNESS:  [name] is a 55 year old man with  HIV disease, hepatitis C, and cocaine abuse, and his last CD-4 count was  40 with a viral load of  200,000.  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  The patient is admitted via the emergency  department with complaints of fever, headache, shortness of breath of 2  weeks duration.  Headache is bitemporal, off and on, lasting up to 10  minutes each time.  He does not know of anything that started/triggered  it, denies any trauma, falls, or seizures.  This pain goes away on its  own, only to occur again, has nausea but no vomiting.  The patient  denies any rash or photophobia.  The headache feels similar to the ones  he has had on earlier admissions.  He denies any visual, speech,  hearing, or swallowing problems, but has generalized weakness.  He feels  numb on his left lower leg and denies any  loss of consciousness.  Shortness of breath is worse for the same duration especially with  exertion, but he feels short of breath even while speaking at rest.  Mobility has been greatly decreased because of this.  Ever since going  home from the hospital last month, he has been using a cane for  ambulation.  The patient complains of a dry cough and pleuritic chest  pain on the anterior chest associated with it.  Denies any hemoptysis or  orthopnea, leg swelling, or weight gain.  The patient also mentions  feeling feverish all the time, although he did not check his temperature  at home.  His appetite has gone down and feels he has lost some weight  as well.  Denies any bowel or bladder complaints.   MEDICATIONS PRIOR TO ADMISSION:  1. Prezista 400 mg tablets 2 tablets once daily with Norvir and      Truvada.  2. Norvir 100 mg capsules 1 capsule daily with 2 Prezista tablets and      1 Truvada tablet.  3. Truvada 200-300 mg tablets 1 tablet with 2 tablets of Prezista and      1 capsule of Norvir.  4. Bactrim 800-160 mg, take 1 tablet by mouth once a day.  5. Zithromax 600 mg tablets, take 2 tablets  weekly.  6. Fluconazole 200 mg tablets, take 4 pills daily for 1 week, then 2      pills daily for 2 months, and then 1 pill until further      recommendation.  7. Protonix 40 mg pack 1 pill by mouth daily.  8. Prednisone 20 mg tablets, 3 tablets daily until instructed by Dr.      Daiva Eves.   ALLERGIES:  No known drug allergies.   PAST MEDICAL HISTORY:  Significant for:  1. Hepatitis C.  2. HIV disease.  3. Substance abuse.  4. Tobacco abuse.   ADMISSION PHYSICAL EXAMINATION:  VITAL SIGNS:  The patient's temperature  was 100.7 degrees Fahrenheit, blood pressure 108/72, pulse rate 105,  respiratory rate 22 per minute, and sating 99% on room air.  GENERAL:  Alert, underweight appearing, temporal wasting, and short of  breath on speaking.  HEAD:  Normocephalic and atraumatic.  EYES:  Vision grossly intact.  Pupils round and pupils reactive to  light.  EARS:  No external deformities.  NOSE:  No external deformity.  MOUTH:  Pharynx pink and moist.  No erythema.  No exudates, somewhat  poor dentition.  NECK:  Supple.  No thyromegaly or other masses appreciated.  LUNGS:  Bilateral good air entry.  Few crackles on the right base.  HEART:  Normal rate.  No murmur, no gallop, no rub.  ABDOMEN:  Soft, nontender, and nondistended.  Bowel sounds in all 4  quadrants.  No organomegaly palpated.  PULSES:  Normal pedal and radial pulses, 1+.  EXTREMITIES:  No pedal edema, cyanosis, or clubbing.  NEUROLOGIC:  Alert and oriented x3 with cranial nerves II through XII  grossly intact.  Generalized reduced strength 4/5 and did have  diminished sensation to fine touch and pain in the left lower extremity.  No lymphadenopathy.  PSYCHIATRY:  The patient is normal and interactive.   ADMISSION LABORATORY DATA:  A CBC of white blood count of 5.5,  hemoglobin 9.8, hematocrit 28.3, platelet count 234 with an MCV of 89.1,  MCHC of 34.4, and RDW of 18.4.  Chemistry panel:  Sodium 129, potassium  4.1,  chloride 100, CO2 20, BUN  9, and creatinine 0.79 with a glucose of  127.   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  1. Headache.  It is concerning for CNS infection, bacterial versus      viral versus fungal and cryptococcal meningitis.  He had apparently      completed a course of antifungal therapy prior to last admission.      Also in the differential is immune reconstitution syndrome, given      reported compliance with antiretroviral medications.  He does not      have any signs of meningeal irritation, but he may not be able to      mount any significant reaction to these potential infections.  CT      of head is negative for any mass or lesions ruling out      toxoplasmosis, primary CNS lymphoma.  We will plan on getting a      lumbar puncture to check for meningitis, viral encephalitis and      fungal infection.  We will initiate treatment based on the finding.      We will check the CSF for protein, glucose, cell count with      differential, cryptococcal antigen, cryptococcal [place] ink, Gram      stain culture, AFB stain, and HSV-PCR.  We will cover him with      ceftazidime and vancomycin as well as acyclovir and fluconazole.      We will plan to touch base with Infectious Disease depending on the      findings and discuss starting steroids for immune reconstitution      syndrome.  2. Shortness of breath, likely related to a suspicious pneumonia seen      on the chest x-ray in the right lower lobe and potentially also the      right middle lobe.  Once again, given the CD-4 count of 40, the      possibilities of broad ranging from typical and atypical infections      of the chest,  opportunistic ones including PCP.  Given his recent      hospitalization, he will require a broad coverage to treat possible      hospital-acquired pneumonia.  We will check an ABG to help use of      the steroids for PCP.  Additionally, we will check sputum Gram      stain culture, PCP stain, blood culture, Legionella urine antigen,      serum cryptococcal antigen and again we will cover very broadly      with the ceftazidime, vancomycin, azithromycin, Bactrim, and      fluconazole.  3. Human immunodeficiency virus/acquired immune deficiency syndrome.      At this point, we will hold off the HAART, therapy is pending      evaluation of his current deterioration and again we will touch      base with Infectious Disease before reinstituting the therapy.  4. Hepatitis C.  We will need to check an HCV genotype and a viral      load, and we will potentially need a liver biopsy, however, all      these can be done as an outpatient following discharge.  5. Substance abuse and tobacco abuse.  The patient will receive      counseling here in the hospital plus nicotine patch tablets to help      with the tobacco abuse.   HOSPITAL COURSE BY PROBLEM:  1. Headache.  The patient continued to have bitemporal piercing      headaches, which the pain was well controlled with p.o. Percocet      throughout his hospital stay.  Full workup to exclude any      possibilities was undertaken, some as previously reported.  A CT of      the head was negative for any intracranial abnormality.  A lumbar      puncture was performed initially upon his admission.  This was      significant for a positive cryptococcal antigen as well as      increased protein and a decreased glucose.  The opening pressure      was not recorded at that time.  This headache remained under good      control throughout his hospital stay and the patient never had any      signs of neck rigidity or photophobia, other signs of meningitis       despite the positive cryptococcal antigen and again this was      covered with the broad assortment of antimicrobials including      ceftazidime, vancomycin, acyclovir, and fluconazole.  2. Shortness of breath.  This is likely related to a suspicious      pneumonia seen on chest x-ray, given the CD-4 count of 40, the      possibilities are broad, are ranging from typical and atypical      infections to opportunistic ones including PCP and again given the      recent hospitalization, we required broad coverage to treat      potential hospital-acquired pneumonia.  For the possibility of PCP,      Bactrim was another antimicrobial addition to his already broad      regimen and throughout his stay, he remained somewhat short of      breath even with minimal exertion and conversation.  3. HIV/AIDS.  Initially, we held off on the patient's home      medications, pending an evaluation of what was going on with his      deterioration.  We did touch base with the Infectious Disease Team      and on hospital day 3, we started him back on his 3 home meds      including Prezista, Norvir, and Truvada.  4. Hepatitis C.  Hepatitis C genotype was positive and further workup      will need to be undertaken as an outpatient where the viral load to      be checked and whether or not liver biopsy will be needed.  This      will be done as I mentioned after hospital discharge.  5. Substance abuse.  The patient received counseling during his stay      in the hospital.  6. Tobacco abuse.  The patient had nicotine patch and was counseled      for smoking cessation during his hospital stay.   DISCHARGE LABORATORY DATA AND VITALS:  On the day of discharge, October 25, 2008, the patient's labs included a CBC of white blood cell count of  5.6, hemoglobin of 10, hematocrit of 29.4 with platelet count 402.  Serum chemistries include sodium 133, potassium 4.4, chloride 101, CO2  of  24, BUN 10, and creatinine 0.8 with  a glucose of 109, and calcium  9.2.   On discharge day, vitals include a temperature of 98.1, pulse of 87,  respiratory rate of 20, systolic blood pressure 119, and diastolic blood  pressure 76.  [name] had oxygen saturation of 98% on room air.     ______________________________  Algernon Huxley, M.D.  Electronically Signed    BS/MEDQ  D:  10/26/2008  T:  10/27/2008  Job:  [id number]

## 2011-01-05 ENCOUNTER — Ambulatory Visit: Payer: Self-pay | Admitting: Infectious Disease

## 2011-01-09 ENCOUNTER — Other Ambulatory Visit (INDEPENDENT_AMBULATORY_CARE_PROVIDER_SITE_OTHER): Payer: Medicaid Other

## 2011-01-09 DIAGNOSIS — B20 Human immunodeficiency virus [HIV] disease: Secondary | ICD-10-CM

## 2011-01-09 LAB — CBC WITH DIFFERENTIAL/PLATELET
Basophils Absolute: 0.1 10*3/uL (ref 0.0–0.1)
Basophils Relative: 1 % (ref 0–1)
Eosinophils Absolute: 0.2 10*3/uL (ref 0.0–0.7)
Eosinophils Relative: 3 % (ref 0–5)
HCT: 40.7 % (ref 39.0–52.0)
Hemoglobin: 13.8 g/dL (ref 13.0–17.0)
Lymphocytes Relative: 35 % (ref 12–46)
Lymphs Abs: 1.7 10*3/uL (ref 0.7–4.0)
MCH: 29.7 pg (ref 26.0–34.0)
MCHC: 33.9 g/dL (ref 30.0–36.0)
MCV: 87.7 fL (ref 78.0–100.0)
Monocytes Absolute: 0.6 10*3/uL (ref 0.1–1.0)
Monocytes Relative: 12 % (ref 3–12)
Neutro Abs: 2.4 10*3/uL (ref 1.7–7.7)
Neutrophils Relative %: 49 % (ref 43–77)
Platelets: 301 10*3/uL (ref 150–400)
RBC: 4.64 MIL/uL (ref 4.22–5.81)
RDW: 13.1 % (ref 11.5–15.5)
WBC: 4.9 10*3/uL (ref 4.0–10.5)

## 2011-01-10 LAB — COMPLETE METABOLIC PANEL WITH GFR
ALT: 30 U/L (ref 0–53)
AST: 39 U/L — ABNORMAL HIGH (ref 0–37)
Albumin: 4 g/dL (ref 3.5–5.2)
Alkaline Phosphatase: 74 U/L (ref 39–117)
BUN: 10 mg/dL (ref 6–23)
CO2: 22 mEq/L (ref 19–32)
Calcium: 9.2 mg/dL (ref 8.4–10.5)
Chloride: 108 mEq/L (ref 96–112)
Creat: 1.09 mg/dL (ref 0.50–1.35)
GFR, Est African American: >60 mL/min (ref >60)
GFR, Est Non African American: >60 mL/min (ref >60)
Glucose, Bld: 83 mg/dL (ref 70–99)
Potassium: 4.2 mEq/L (ref 3.5–5.3)
Sodium: 142 mEq/L (ref 135–145)
Total Bilirubin: 0.3 mg/dL (ref 0.3–1.2)
Total Protein: 7.6 g/dL (ref 6.0–8.3)

## 2011-01-10 LAB — T-HELPER CELL (CD4) - (RCID CLINIC ONLY)
CD4 % Helper T Cell: 16 % — ABNORMAL LOW (ref 33–55)
CD4 T Cell Abs: 270 uL — ABNORMAL LOW (ref 400–2700)

## 2011-01-10 LAB — HIV-1 RNA QUANT-NO REFLEX-BLD
HIV 1 RNA Quant: 120 copies/mL — ABNORMAL HIGH (ref <20)
HIV-1 RNA Quant, Log: 2.08 {Log} — ABNORMAL HIGH (ref <1.30)

## 2011-01-23 ENCOUNTER — Ambulatory Visit (INDEPENDENT_AMBULATORY_CARE_PROVIDER_SITE_OTHER): Payer: Medicaid Other | Admitting: Infectious Disease

## 2011-01-23 ENCOUNTER — Encounter: Payer: Self-pay | Admitting: Infectious Disease

## 2011-01-23 DIAGNOSIS — B20 Human immunodeficiency virus [HIV] disease: Secondary | ICD-10-CM

## 2011-01-23 DIAGNOSIS — Z113 Encounter for screening for infections with a predominantly sexual mode of transmission: Secondary | ICD-10-CM

## 2011-01-23 DIAGNOSIS — B459 Cryptococcosis, unspecified: Secondary | ICD-10-CM

## 2011-01-23 DIAGNOSIS — K645 Perianal venous thrombosis: Secondary | ICD-10-CM

## 2011-01-23 NOTE — Progress Notes (Signed)
  Subjective:    Patient ID: [name], male    DOB: [birth year], 55 y.o.   MRN: [id number]  HPI  55 year old AA man with HIV/AIDS prior cryptococcal meningitis who has been remarkably compliant for 2011 thru 2012 with undetectable viral loads until his most recent visit when came up to 120. He admitted to missing 3 to 4 days of meds prior to this blood draw. CD4 is now above 200.   He was seen in [facility] by my partner Enedina Finner and diagnosed with thrombosed internal hemorrhoid and sent to [facility] for surgical evaluation. Apparently he had "issues" with the "attitude" of the staff there, and apparently had a copay which he claims he could not make at that time. I examined the patient again and confirmed presence of painful mass c/w hemrroiid protruding from anal vault. I strongly encouraged him to go back to [facility] and assured him he would get excellent care there and that I could not fix this with medicines.  Review of Systems See HPI otherwise negative on 12 point review    Objective:   Physical Exam  Constitutional: No distress.  HENT:  Head: Atraumatic.  Nose: Nose normal.  Mouth/Throat: Oropharynx is clear and moist. No oropharyngeal exudate.  Eyes: Conjunctivae and EOM are normal. Pupils are equal, round, and reactive to light. No scleral icterus.  Neck: No JVD present.  Cardiovascular: Normal rate, regular rhythm and normal heart sounds.  Exam reveals no gallop and no friction rub.   No murmur heard. Pulmonary/Chest: No respiratory distress. He has no wheezes. He has no rales. He exhibits no tenderness.  Abdominal: He exhibits no distension. There is no rebound.  Genitourinary:       Had protruding painful lesion within rectum c/w thrombosed hemorrhoid  Musculoskeletal: He exhibits no edema and no tenderness.  Lymphadenopathy:    He has no cervical adenopathy.  Neurological: Coordination normal.  Skin: No rash noted. He is not diaphoretic. No erythema. No pallor.  Psychiatric: He  has a normal mood and affect. His behavior is normal. Thought content normal.          Assessment & Plan:  Hemorrhoids, internal, thrombosed See Dr. Tyrone Apple. Last note. Agree that he needs evalation by [facility] for likely thrombosed internal hemorrhoid  HIV DISEASE He has done exceptionally well on boosted prezista and truvada. I will dc his OI prophylaxis of bactrim and secondary prophylaxis for crypto at next visit if CD4 remains up  Cryptococcosis See above. Will continue fluconazole for now then dc once his cd4 stays up

## 2011-01-23 NOTE — Assessment & Plan Note (Signed)
He has done exceptionally well on boosted prezista and truvada. I will dc his OI prophylaxis of bactrim and secondary prophylaxis for crypto at next visit if CD4 remains up

## 2011-01-23 NOTE — Assessment & Plan Note (Signed)
See above. Will continue fluconazole for now then dc once his cd4 stays up

## 2011-01-23 NOTE — Assessment & Plan Note (Signed)
See Dr. Tyrone Apple. Last note. Agree that he needs evalation by [facility] for likely thrombosed internal hemorrhoid

## 2011-01-26 ENCOUNTER — Other Ambulatory Visit (INDEPENDENT_AMBULATORY_CARE_PROVIDER_SITE_OTHER): Payer: Self-pay | Admitting: General Surgery

## 2011-01-26 ENCOUNTER — Ambulatory Visit (HOSPITAL_COMMUNITY): Payer: Medicaid Other

## 2011-01-26 ENCOUNTER — Ambulatory Visit (HOSPITAL_COMMUNITY)
Admission: RE | Admit: 2011-01-26 | Discharge: 2011-01-26 | Disposition: A | Discharge status: Home / Self Care | Payer: Medicaid Other | Source: Ambulatory Visit | Attending: General Surgery | Admitting: General Surgery

## 2011-01-26 DIAGNOSIS — C211 Malignant neoplasm of anal canal: Secondary | ICD-10-CM | POA: Insufficient documentation

## 2011-01-26 DIAGNOSIS — F17200 Nicotine dependence, unspecified, uncomplicated: Secondary | ICD-10-CM | POA: Insufficient documentation

## 2011-01-26 DIAGNOSIS — Z01812 Encounter for preprocedural laboratory examination: Secondary | ICD-10-CM | POA: Insufficient documentation

## 2011-01-26 DIAGNOSIS — B20 Human immunodeficiency virus [HIV] disease: Secondary | ICD-10-CM | POA: Insufficient documentation

## 2011-01-26 DIAGNOSIS — B1920 Unspecified viral hepatitis C without hepatic coma: Secondary | ICD-10-CM | POA: Insufficient documentation

## 2011-01-26 DIAGNOSIS — K6289 Other specified diseases of anus and rectum: Secondary | ICD-10-CM

## 2011-01-26 DIAGNOSIS — Z01818 Encounter for other preprocedural examination: Secondary | ICD-10-CM | POA: Insufficient documentation

## 2011-01-26 HISTORY — PX: MASS EXCISION: SHX2000

## 2011-01-26 HISTORY — DX: Other specified diseases of anus and rectum: K62.89

## 2011-01-26 LAB — DIFFERENTIAL
Basophils Absolute: 0 10*3/uL (ref 0.0–0.1)
Basophils Relative: 1 % (ref 0–1)
Eosinophils Absolute: 0.2 10*3/uL (ref 0.0–0.7)
Eosinophils Relative: 2 % (ref 0–5)
Lymphocytes Relative: 29 % (ref 12–46)
Lymphs Abs: 2.1 10*3/uL (ref 0.7–4.0)
Monocytes Absolute: 0.8 10*3/uL (ref 0.1–1.0)
Monocytes Relative: 11 % (ref 3–12)
Neutro Abs: 4.1 10*3/uL (ref 1.7–7.7)
Neutrophils Relative %: 58 % (ref 43–77)

## 2011-01-26 LAB — CBC
HCT: 42 % (ref 39.0–52.0)
Hemoglobin: 14.4 g/dL (ref 13.0–17.0)
MCH: 29.7 pg (ref 26.0–34.0)
MCHC: 34.3 g/dL (ref 30.0–36.0)
MCV: 86.6 fL (ref 78.0–100.0)
Platelets: 295 10*3/uL (ref 150–400)
RBC: 4.85 MIL/uL (ref 4.22–5.81)
RDW: 13.3 % (ref 11.5–15.5)
WBC: 7.1 10*3/uL (ref 4.0–10.5)

## 2011-01-26 LAB — BASIC METABOLIC PANEL
BUN: 21 mg/dL (ref 6–23)
CO2: 21 mEq/L (ref 19–32)
Calcium: 10.1 mg/dL (ref 8.4–10.5)
Chloride: 102 mEq/L (ref 96–112)
Creatinine, Ser: 1.1 mg/dL (ref 0.50–1.35)
GFR calc Af Amer: >60 mL/min (ref >60)
GFR calc non Af Amer: >60 mL/min (ref >60)
Glucose, Bld: 73 mg/dL (ref 70–99)
Potassium: 4.5 mEq/L (ref 3.5–5.1)
Sodium: 135 mEq/L (ref 135–145)

## 2011-01-26 LAB — SURGICAL PCR SCREEN
MRSA, PCR: NEGATIVE
Staphylococcus aureus: NEGATIVE

## 2011-01-26 IMAGING — CR DG CHEST 2V
2 series · 2 of 2 positions shown · non-contrast
Comparison: [DATE]

CLINICAL DATA: Preoperative respiratory exam; anal mass; smoker

CHEST - 2 VIEW

[view not recorded (1 of 2)]
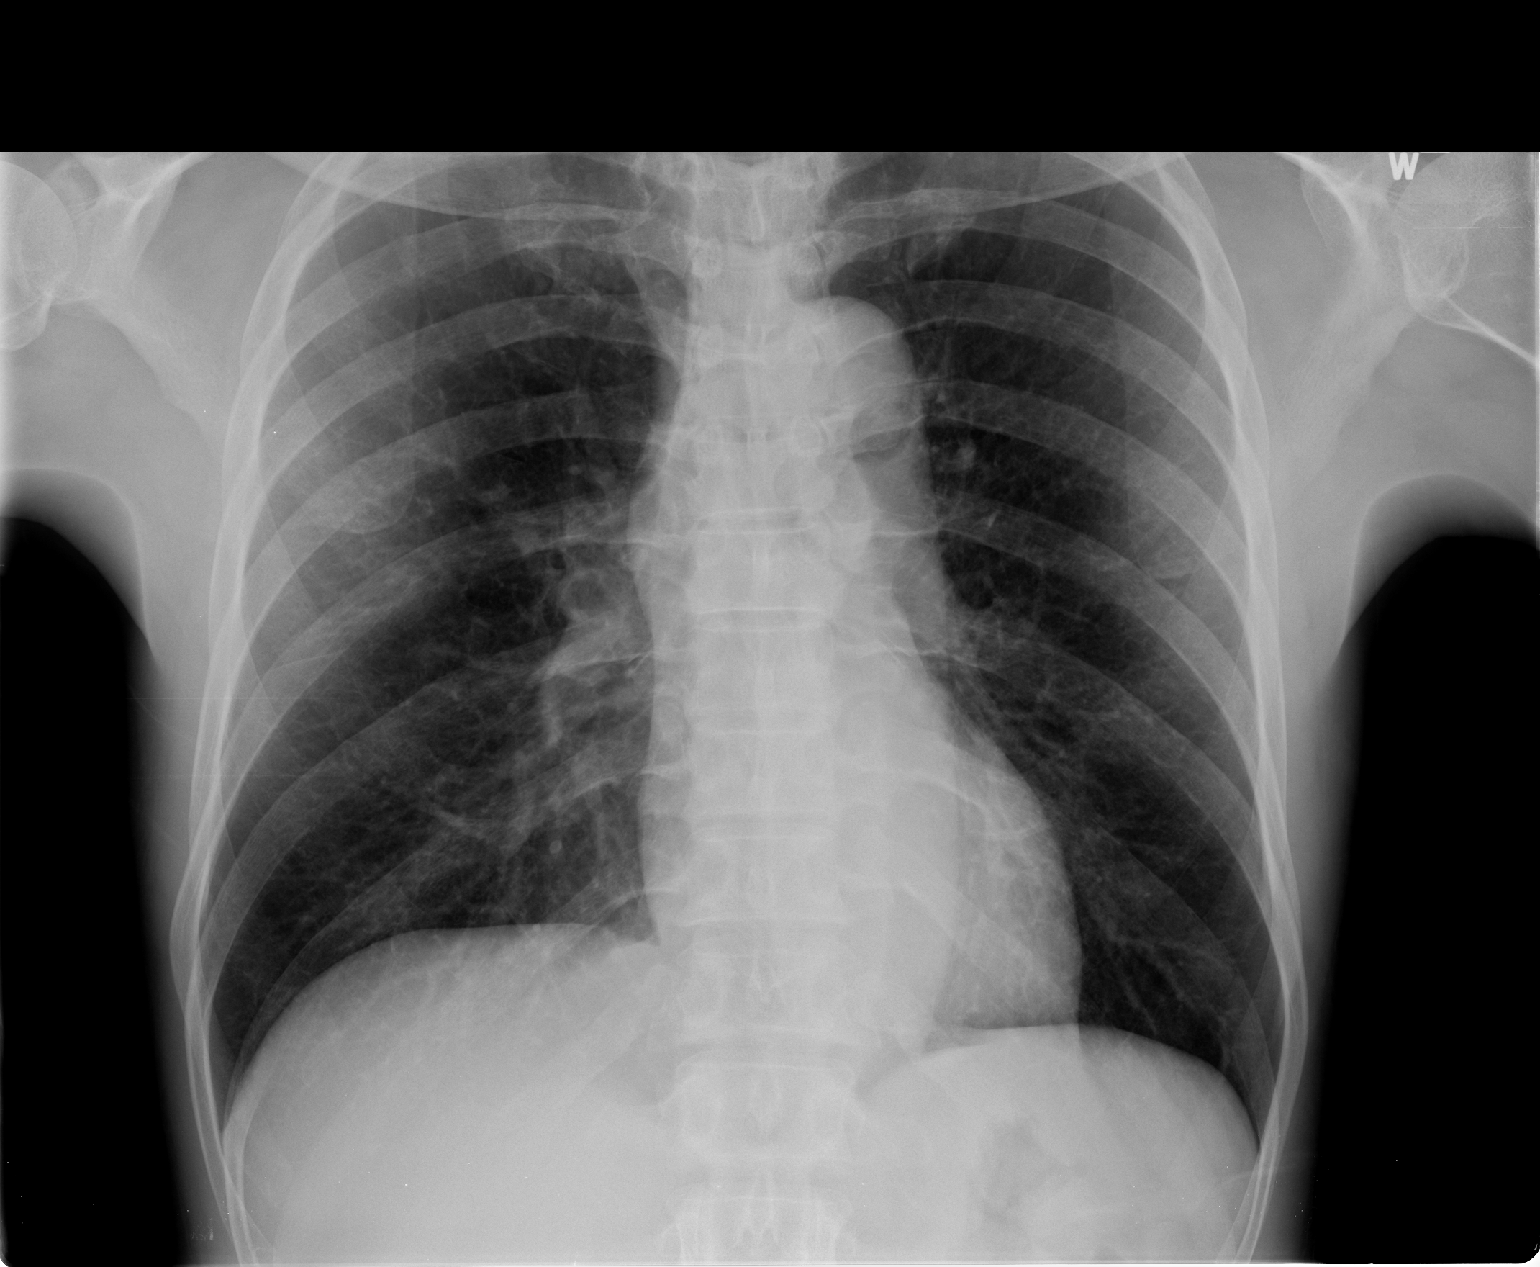

[view not recorded (2 of 2)]
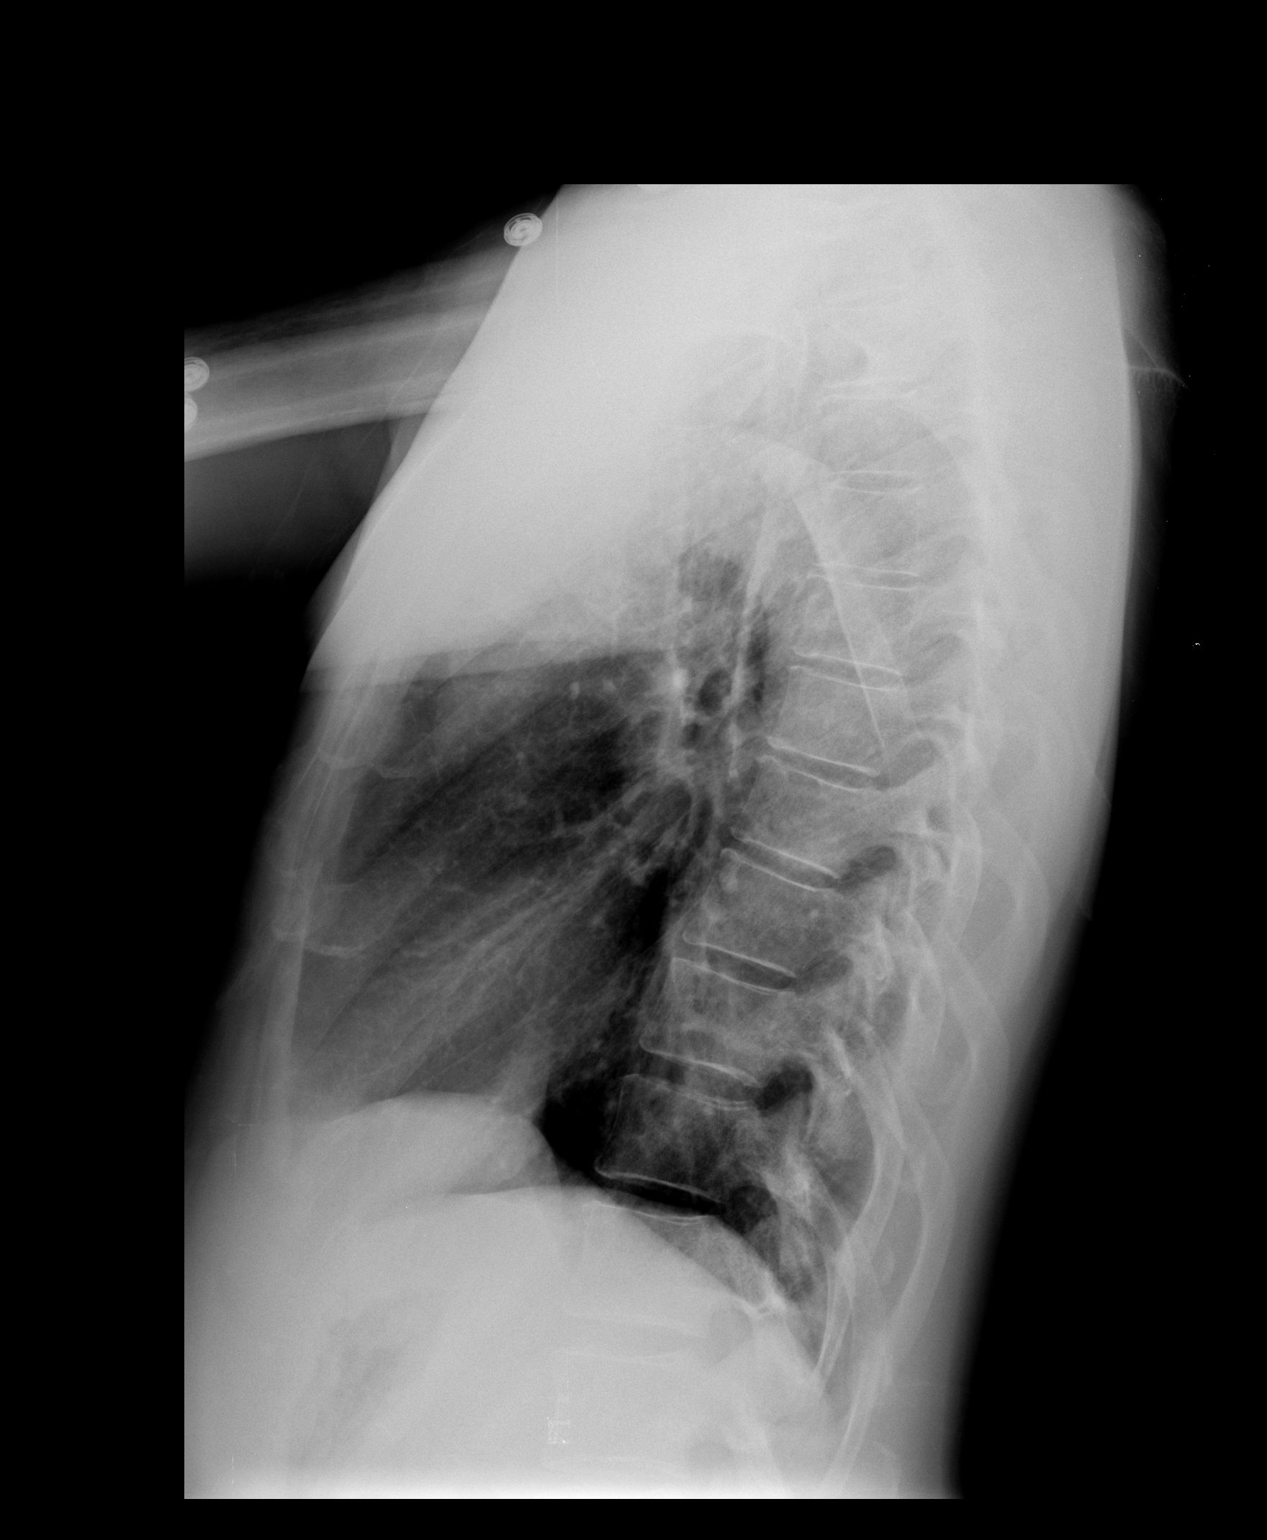

[2 of 2 positions shown; findings below may reference images not displayed]

FINDINGS: The cardiac silhouette, mediastinum, pulmonary
vasculature are within normal limits.  Both lungs are clear except
for stable, chronic interstitial fibrotic changes.   There is no
acute bony abnormality.
IMPRESSION: There is no evidence of acute cardiac or pulmonary process.

## 2011-02-14 NOTE — Op Note (Signed)
NAMEMAJESTY, OEHLERT NO.:  [id number]  MEDICAL RECORD NO.:  [id number]  LOCATION:  [facility]                         FACILITY:  [facility]  PHYSICIAN:  Anselm Pancoast. Arvie Villarruel, M.D.DATE OF BIRTH:  [birth year]  DATE OF PROCEDURE:  01/26/2011 DATE OF DISCHARGE:  01/26/2011                              OPERATIVE REPORT   PREOPERATIVE DIAGNOSES:  Hemorrhoidal anal mass, history of human immunodeficiency virus, possible malignancy.  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  OPERATION:  Excisional biopsy of about a 3 cm larger mass right in the anterior anal canal.  ANESTHESIA:  General anesthesia, lithotomy position.  HISTORY:  [name] is a 55 year old black male known age for many years who was referred to me in the Infectious Disease Clinic on Monday with this lesion in the anal area that was thought to be a hemorrhoid. On examination in the office, trying to examine him is very difficult. 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  I could not do it actually much of an exam with anoscope because of the discomfort and I recommended we try to do an excisional biopsy here in the operating room and have scheduled him urgently.  We could get him on the OR schedule today.  He is n.p.o.  He is on all of his chronic HIV medications and Dr. Daiva Eves is his infectious disease doctor.  The patient's laboratory studies are good.  His hematocrit is good and his white blood count was within normal limits.  The patient was given a gram of Kefzol, taken back to the operative suite.  LMA anesthesia.  He was asleep and then placed up in the yellow fin stirrups.  He was slipped down on the table and the examination while we were prepping and this appears to be more internal hemorrhoidal area  than the area externally, so I am sure that what I was seeing in the office is where he is prolapsing the area.  I prepped him with Betadine surgical scrubbing solution including up in the inside of the anus in course with any type of manipulation on prepping the areas, kind of not a arterial type bleeding but definite bleeding as he has been describing.  I then draped him with the towels, yellow fin,  __________ and his feet well up fairly high.  Next with him in this position, I first injected a little bit of Marcaine with adrenaline really right under the actual lesion.  I did not do a block all around the anus but just really injected up under the area.  I then picked a little area of the skin just external to this mass and then tried to start elevating it off of the internal sphincter and this actually grows into the sphincter anteriorly.  The area is very hemorrhagic and there was more bleeding going to the area than usual and from the looks of the tissue, it looks more or like the rectal type cancers than the solid or more  typical appearing squamous cell that I have seen previously.  I was able to go ahead and basically excise the area and then as far as for hemostasis, I had started off using some 2-0 chromic when I switched to 3-0 Vicryl for I think that the chromic was going to give way and will have bleeding.  The little suture line was partially closed with these interrupted Vicryl sutures and then on the external portion, I did put a couple of 2-0 chromic.  We probably 100 mL of blood loss but he was not bleeding at completion of surgery and there was really only __________ most of bleeding with the exception of just the tumor bleeds very friable.  I put the rectal numbing medicine within the anal canal, then used a little piece of Gelfoam.  The patient waking up.  He was going to resisting, coughing, etc., and the Gelfoam was trying to slide out but hopefully we can keep most  of the numbing medicine in the anus.  The patient will be released after this and I await the path report and fear that this is definitely going to be a malignancy.  It is my opinion since I do not think this is a hemorrhoid at all.  On examination of the other hemorrhoid areas with anoscope, it looked normal.  He has got very minimal hemorrhoids in the other quadrants.     Anselm Pancoast. Zachery Dakins, M.D.     WJW/MEDQ  D:  01/26/2011  T:  01/27/2011  Job:  [id number]  cc:   Acey Lav, MD  Electronically Signed by Consuello Bossier M.D. on 02/14/2011 03:50:22 PM

## 2011-02-20 ENCOUNTER — Encounter (INDEPENDENT_AMBULATORY_CARE_PROVIDER_SITE_OTHER): Payer: Self-pay | Admitting: General Surgery

## 2011-02-23 ENCOUNTER — Other Ambulatory Visit: Payer: Self-pay | Admitting: Infectious Disease

## 2011-02-23 DIAGNOSIS — B20 Human immunodeficiency virus [HIV] disease: Secondary | ICD-10-CM

## 2011-02-28 ENCOUNTER — Other Ambulatory Visit (INDEPENDENT_AMBULATORY_CARE_PROVIDER_SITE_OTHER): Payer: Self-pay | Admitting: General Surgery

## 2011-02-28 ENCOUNTER — Encounter (INDEPENDENT_AMBULATORY_CARE_PROVIDER_SITE_OTHER): Payer: Self-pay | Admitting: General Surgery

## 2011-02-28 ENCOUNTER — Ambulatory Visit (INDEPENDENT_AMBULATORY_CARE_PROVIDER_SITE_OTHER): Payer: Self-pay | Admitting: General Surgery

## 2011-02-28 VITALS — BP 108/80 | HR 80 | Temp 96.2°F | Ht 71.0 in | Wt 150.4 lb

## 2011-02-28 DIAGNOSIS — C210 Malignant neoplasm of anus, unspecified: Secondary | ICD-10-CM

## 2011-02-28 DIAGNOSIS — B20 Human immunodeficiency virus [HIV] disease: Secondary | ICD-10-CM

## 2011-02-28 DIAGNOSIS — Z21 Asymptomatic human immunodeficiency virus [HIV] infection status: Secondary | ICD-10-CM

## 2011-02-28 NOTE — Progress Notes (Signed)
Subjective:     Patient ID: [name], male   DOB: [birth year], 55 y.o.   MRN: [id number]  HPIPatient had a large single quadrant hemorrhoid excise it looked atypical and on path examination is definite squamous cell carcinoma as well as the hemorrhoid at the time of surgery to see the tissue growing into the anal splinter and the patient has been contacted and cervical occasions for this followup visit. He is still taken his HIV medications and Dr. Shaune Garrison a.m. his his infectious disease position.   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 Ballard Garrison who will see him we'll also need to contact the medical oncologist since it's a medical radiation therapy combination treatment. Patient states he is definitely taken his HIV medications and it is causing him to have some loose bowel movements    Plan:     Our plan on seeing the patient in followup approximately a month which hopefully he'll do back half through his prior radiation therapy treatment he did not request additional pain medications at this time.

## 2011-02-28 NOTE — Patient Instructions (Signed)
Dr. Dayton Scrape will call on the time for his appointment the medical oncologist will also call on plan to see him.  I would like to see you in approximately one month and hopefully you will be halfway through your treatment of radiation chemotherapy for this problem at that time

## 2011-03-04 ENCOUNTER — Other Ambulatory Visit: Payer: Self-pay | Admitting: Oncology

## 2011-03-06 ENCOUNTER — Telehealth: Payer: Self-pay | Admitting: Infectious Disease

## 2011-03-06 ENCOUNTER — Other Ambulatory Visit: Payer: Self-pay | Admitting: Radiation Oncology

## 2011-03-06 DIAGNOSIS — C210 Malignant neoplasm of anus, unspecified: Secondary | ICD-10-CM

## 2011-03-06 NOTE — Telephone Encounter (Signed)
Dr. Gaylyn Rong called from cancer center. 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 He is IN NEED OF BOTH CHEMOTHERAPY AND RADIATION THERAPY. HE NO SHOWED FOR HIS APPT WITH DR. HA TODAY AND SEEMED ACCORDING TO DR. HA TO NOT UNDERSTAND HIS DIAGNOSIS. I CALLED [name] TONIGHT BUT HE WAS GONE. I LEFT MESSAGE FOR HIM TO CALL ME IN THE AM. HE HAS APPT AT 1130AM FOR LABS [facility], THEN 12 NOON APPT WITH DR. HA FOLLOWED BY APPT AT 3PM WITH RADIATION ONCOLOGY WITH DR. Dayton Scrape. WITH RADIATION AND CHEMO THIS CANCER CAN BE COMPLETELY CURED. IF HE DOES NOT UNDERGO THERAPY HE WILL END UP REQUIRING SURGERY WHICH WILL LEAVE HIM WITH A DIVERTING COLOSTOMY. Do we have bridge counselor or [facility] case manager who can help me convince [name] to make these appts tomorrow.

## 2011-03-07 ENCOUNTER — Ambulatory Visit
Admission: RE | Admit: 2011-03-07 | Discharge: 2011-03-07 | Disposition: A | Discharge status: Home / Self Care | Payer: Medicaid Other | Source: Ambulatory Visit | Attending: Radiation Oncology | Admitting: Radiation Oncology

## 2011-03-07 DIAGNOSIS — Z79899 Other long term (current) drug therapy: Secondary | ICD-10-CM | POA: Insufficient documentation

## 2011-03-07 DIAGNOSIS — Z510 Encounter for antineoplastic radiation therapy: Secondary | ICD-10-CM | POA: Insufficient documentation

## 2011-03-07 DIAGNOSIS — C210 Malignant neoplasm of anus, unspecified: Secondary | ICD-10-CM | POA: Insufficient documentation

## 2011-03-07 DIAGNOSIS — Z21 Asymptomatic human immunodeficiency virus [HIV] infection status: Secondary | ICD-10-CM | POA: Insufficient documentation

## 2011-03-07 NOTE — Telephone Encounter (Signed)
I spoke with [name] with [name] is still her client and she is going to go by the boarding house today to see him and try to get him into care so he can get treatment. She states that he normally calls her about things involving his health, but she was shocked that she hadn't heard from him about his diagnosis. This was her first time hearing of his diagnosis. She will keep [place] posted on her progress.

## 2011-03-09 ENCOUNTER — Other Ambulatory Visit (HOSPITAL_COMMUNITY): Payer: Medicaid Other

## 2011-03-21 ENCOUNTER — Encounter (HOSPITAL_COMMUNITY)
Admission: RE | Admit: 2011-03-21 | Discharge: 2011-03-21 | Disposition: A | Discharge status: Home / Self Care | Payer: Medicaid Other | Source: Ambulatory Visit | Attending: Radiation Oncology | Admitting: Radiation Oncology

## 2011-03-21 ENCOUNTER — Encounter (HOSPITAL_COMMUNITY): Payer: Self-pay

## 2011-03-21 ENCOUNTER — Ambulatory Visit (HOSPITAL_COMMUNITY)
Admission: RE | Admit: 2011-03-21 | Discharge: 2011-03-21 | Disposition: A | Discharge status: Home / Self Care | Payer: Medicaid Other | Source: Ambulatory Visit | Attending: Radiation Oncology | Admitting: Radiation Oncology

## 2011-03-21 DIAGNOSIS — C210 Malignant neoplasm of anus, unspecified: Secondary | ICD-10-CM

## 2011-03-21 DIAGNOSIS — R110 Nausea: Secondary | ICD-10-CM | POA: Insufficient documentation

## 2011-03-21 DIAGNOSIS — Z79899 Other long term (current) drug therapy: Secondary | ICD-10-CM | POA: Insufficient documentation

## 2011-03-21 DIAGNOSIS — J438 Other emphysema: Secondary | ICD-10-CM | POA: Insufficient documentation

## 2011-03-21 DIAGNOSIS — K921 Melena: Secondary | ICD-10-CM | POA: Insufficient documentation

## 2011-03-21 DIAGNOSIS — R209 Unspecified disturbances of skin sensation: Secondary | ICD-10-CM | POA: Insufficient documentation

## 2011-03-21 HISTORY — DX: Essential (primary) hypertension: I10

## 2011-03-21 HISTORY — DX: Malignant (primary) neoplasm, unspecified: C80.1

## 2011-03-21 LAB — GLUCOSE, CAPILLARY: Glucose-Capillary: 41 mg/dL — CL (ref 70–99)

## 2011-03-21 IMAGING — PT NM PET TUM IMG RESTAG (PS) SKULL BASE T - THIGH
6 series · 25 of 25 positions shown · non-contrast
Comparison: [DATE] PET.

CLINICAL DATA: Subsequent treatment strategy for history anal
cancer.  Diagnosed [DATE].  Squamous cell carcinoma.  History of
lung mass [J1].

NUCLEAR MEDICINE PET CT SKULL BASE TO THIGH
TECHNIQUE: 15.5 mCi F-18 FDG was injected intravenously via the
right AC.  Full-ring PET imaging was performed from the skull base
through the mid-thighs 90  minutes after injection.  CT data was
obtained and used for attenuation correction and anatomic
localization only.  (This was not acquired as a diagnostic CT
examination.)
Fasting Blood Glucose:  41

[Series 1: pet ac · axial · 3.3mm · 4.69mm/px · z∈[-882,-12]mm · 5 of 267 slices shown]
[im 1/267]
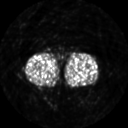
[im 67/267]
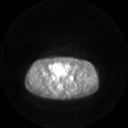
[im 134/267]
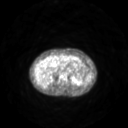
[im 200/267]
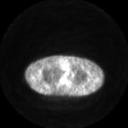
[im 267/267]
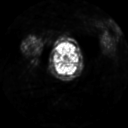

[Series 2: pet nac · axial · 3.3mm · 4.69mm/px · z∈[-882,-12]mm · 6 of 267 slices shown]
[im 1/267]
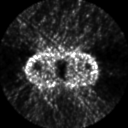
[im 54/267]
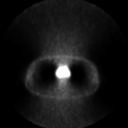
[im 107/267]
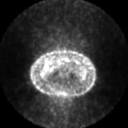
[im 160/267]
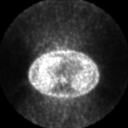
[im 213/267]
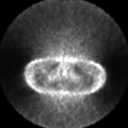
[im 267/267]
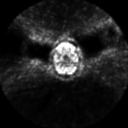

[Series 2: ct images · axial · 3.8mm · 0.98mm/px · z∈[-882,-12]mm · 5 of 267 slices shown]
[im 1/267]
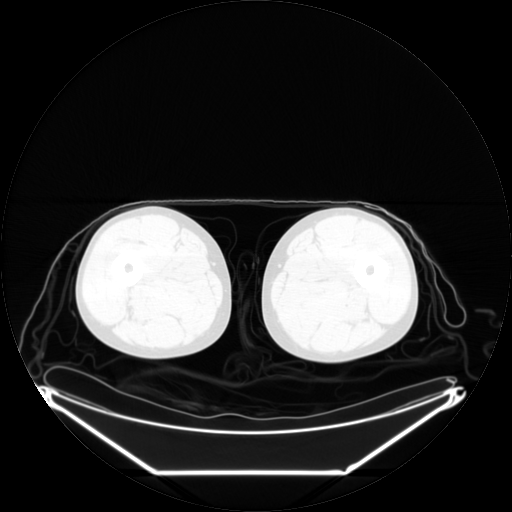
[im 67/267]
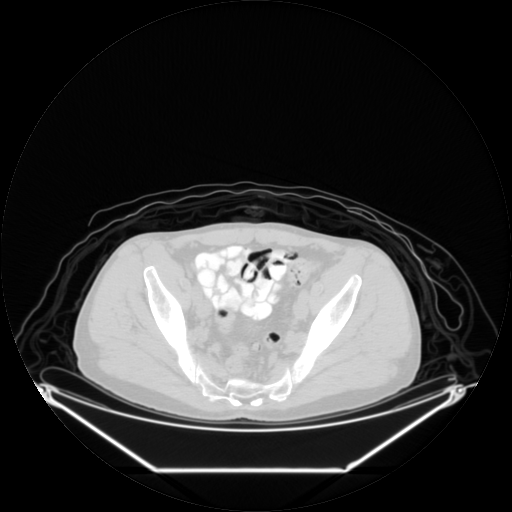
[im 134/267]
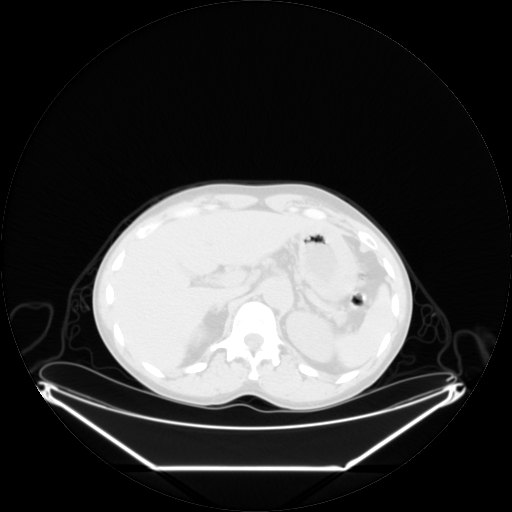
[im 200/267]
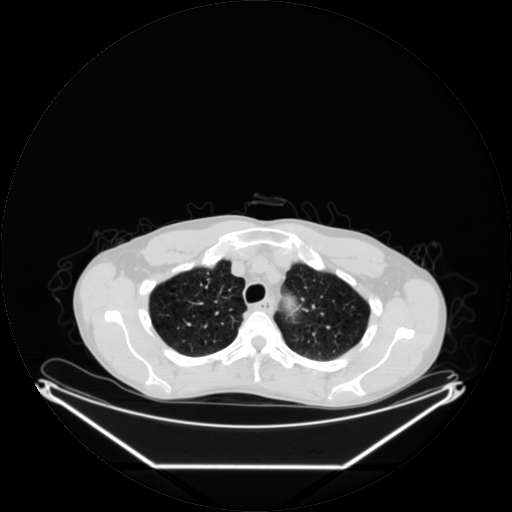
[im 267/267  brain]
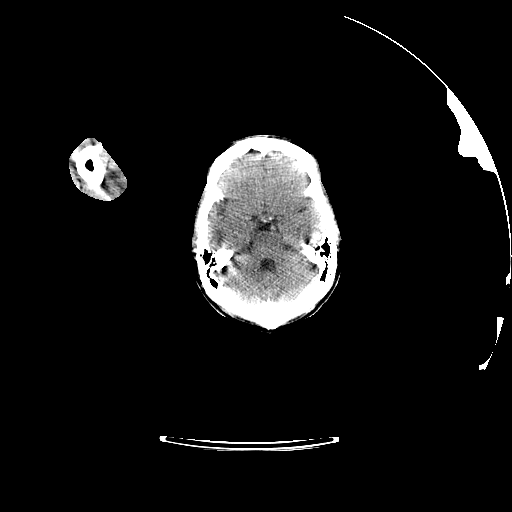

[Series 123: mip · coronal · 3.3mm · 4.69mm/px · 1 of 30 slices shown]
[im 1/30]
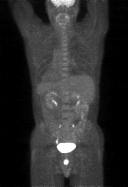

[Series 151: reformatted · axial · 3.3mm · 3.91mm/px · z∈[-882,-12]mm · 6 of 265 slices shown (1 of 2)]
[im 1/265]
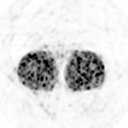
[im 53/265]
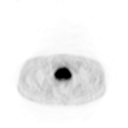
[im 106/265]
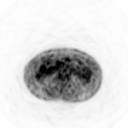
[im 159/265]
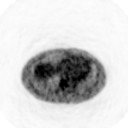
[im 212/265]
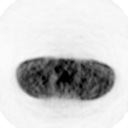
[im 265/265]
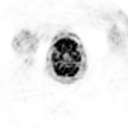

[Series 153: reformatted · coronal · 4.7mm · 6.98mm/px · 2 of 70 slices shown (2 of 2)]
[im 1/70]
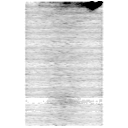
[im 70/70]
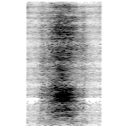

[25 of 25 positions shown; findings below may reference images not displayed]

Abdominal pelvic CT of same date,
dictated separately.  Plain film chest of [DATE].
FINDINGS: PET images demonstrate mildly hypermetabolic axillary
lymph nodes.  Index left axillary node maintains its fatty hilum
and measures 8 mm on image 63.  Similar sized on the prior exam.
Measures a S.U.V. max of 2.0, favored to be reactive.

1.0 cm mixed soft tissue and calcific density in the azygo-
esophageal recess station measures a S.U.V. max of 3.4 on image 88.
On the prior PET, this measured a S.U.V. max of 8.4 and was larger,
measuring 1.3 cm on image 33 of that exam.

A vague right lower lobe nodular density measures 1.0 cm and a
S.U.V. max of 1.1 on image 88.  This is at the site of a 1.2 cm
right lower lobe lung nodule on [DATE].

No abnormal activity within the abdomen.

Low anal hypermetabolism, consistent with the clinical history of
anal cancer.  This measures a S.U.V. max of 23.1, including on
image 231.

A right external iliac node measures 6 mm and a S.U.V. max of 4.1.
This node is likely smaller on the prior PET.
Bilateral mildly hypermetabolic inguinal nodes.  Index right
inguinal node measures 1.0 cm and a S.U.V. max of 2.7 on image 224.
This node measured 7 mm on the prior exam.  Not significantly
hypermetabolic on prior.

CT images performed for attenuation correction demonstrate no
significant findings in the neck.  Moderate centrilobular
emphysema.  Resolution of right apical 1.4 cm lung nodule since the
prior PET.

Abdominal pelvic findings deferred to today's diagnostic CTs,
dictated separately.
IMPRESSION: 1.  Hypermetabolism within the low anus, consistent with the
clinical history.  Equivocal small right external iliac and
bilateral inguinal lymph nodes which demonstrate low level
hypermetabolism.  These would likely be difficult to sample, given
their size.  Therefore, short-term follow-up imaging should be
considered.
2.  Thoracic findings which are overall improved since [DATE].
Cannot exclude residual neoplasm within the right lower lobe or
azygo-esophageal recess nodal stations. Consider correlation with
dedicated chest CT.

## 2011-03-21 IMAGING — CT CT ABD-PELV W/ CM
2 of 6 series · 17 of 46 positions shown, 19 images · IV contrast (agent unspecified)
Comparison: Today's PET, dictated separately.  Prior abdominal CT
of [DATE].

CLINICAL DATA: History anal cancer.  Chemotherapy and radiation
therapy planned.  Rectal pain.

CT ABDOMEN AND PELVIS WITH CONTRAST
TECHNIQUE: Multidetector CT imaging of the abdomen and pelvis was
performed following the standard protocol during bolus
administration of intravenous contrast.
Contrast: 100  ml [XE]

[Series 2: rtn a/p with · axial · 0.68mm/px · z∈[-426,-60]mm · 14 of 85 slices shown, 16 images]
[im 6/85  soft-tissue]
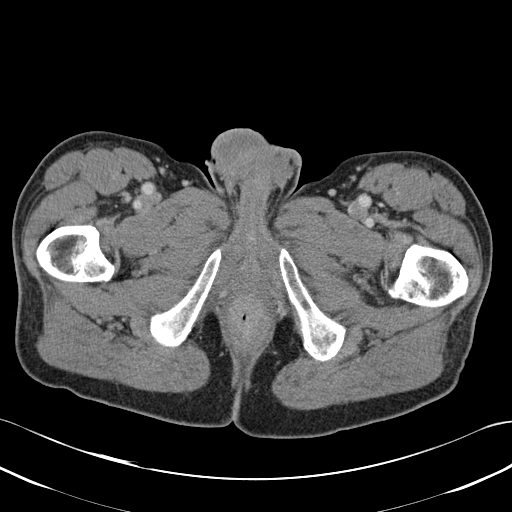
[im 6/85  bone]
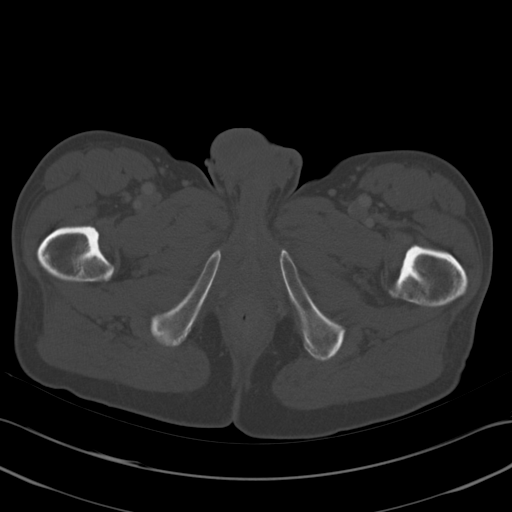
[im 12/85  soft-tissue]
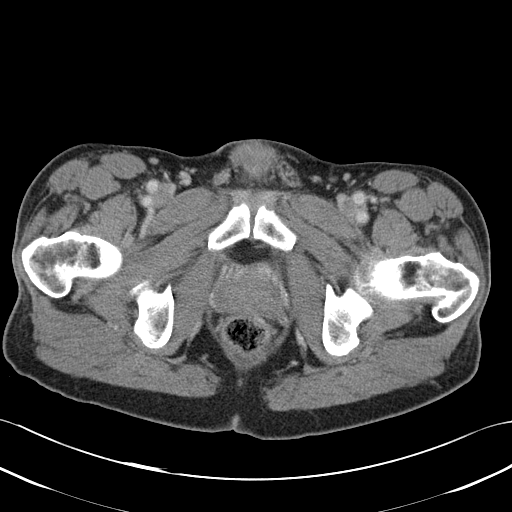
[im 17/85  soft-tissue]
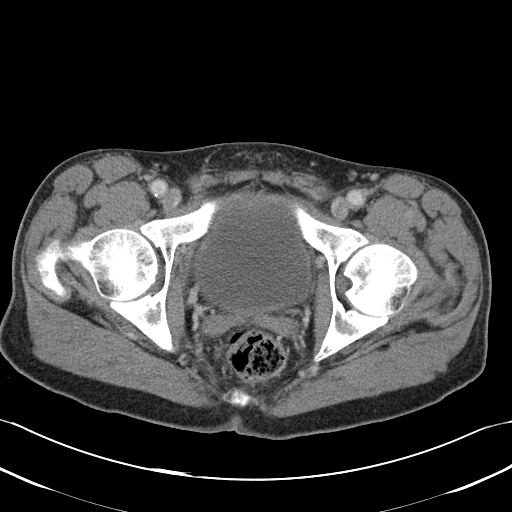
[im 23/85  soft-tissue]
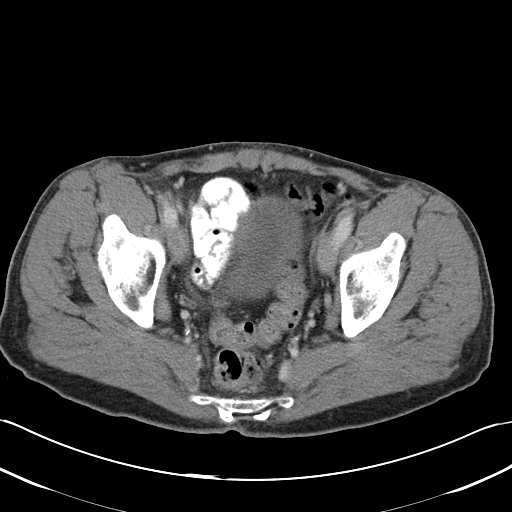
[im 29/85  soft-tissue]
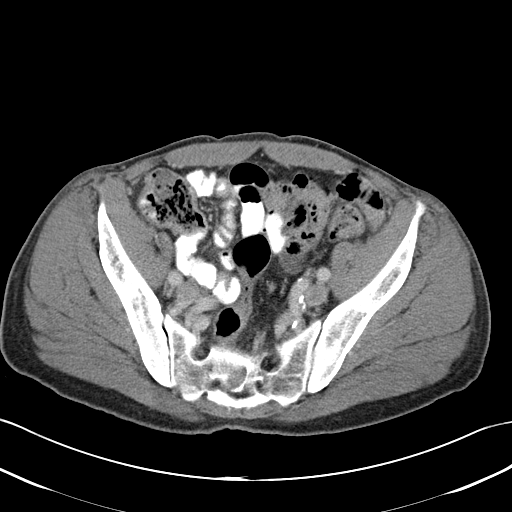
[im 34/85  soft-tissue]
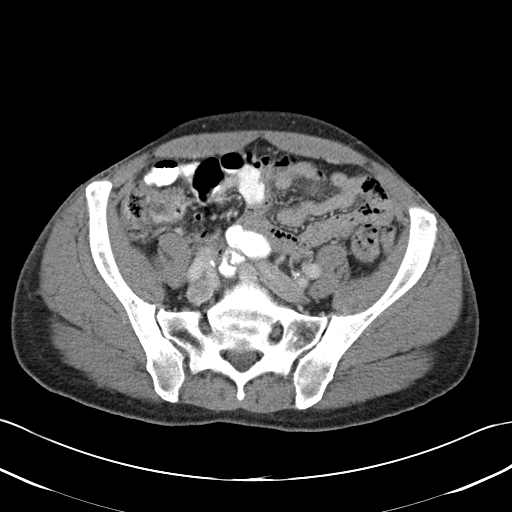
[im 40/85  soft-tissue]
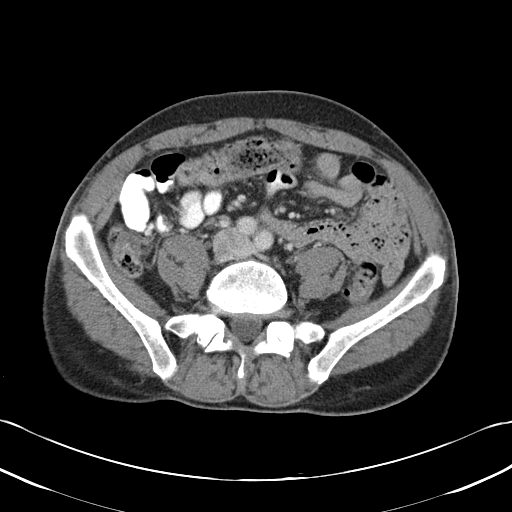
[im 45/85  soft-tissue]
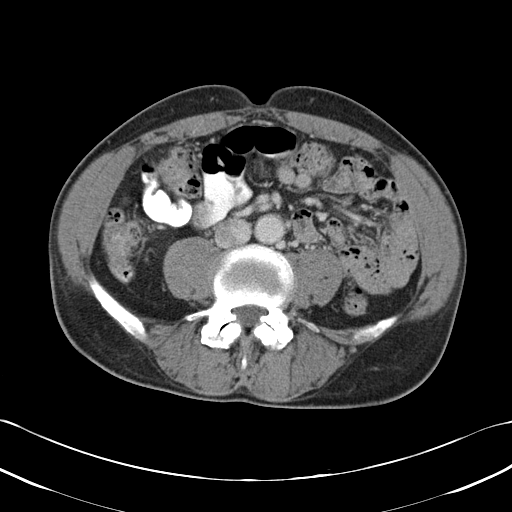
[im 51/85  soft-tissue]
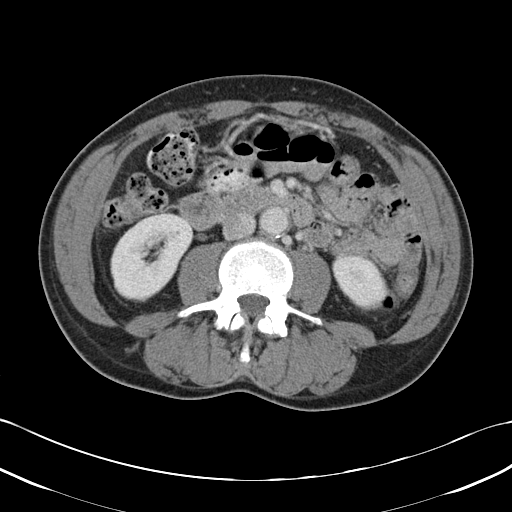
[im 51/85  bone]
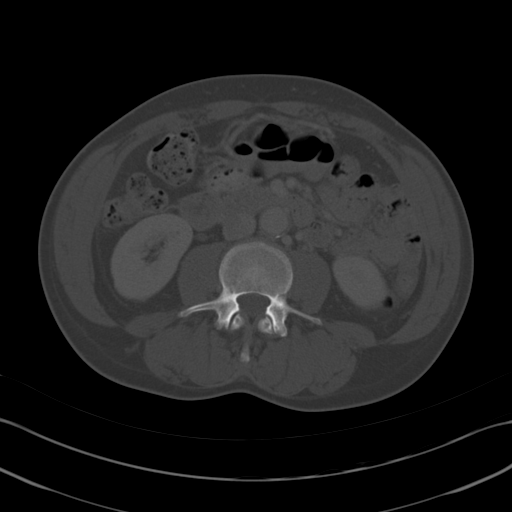
[im 57/85  soft-tissue]
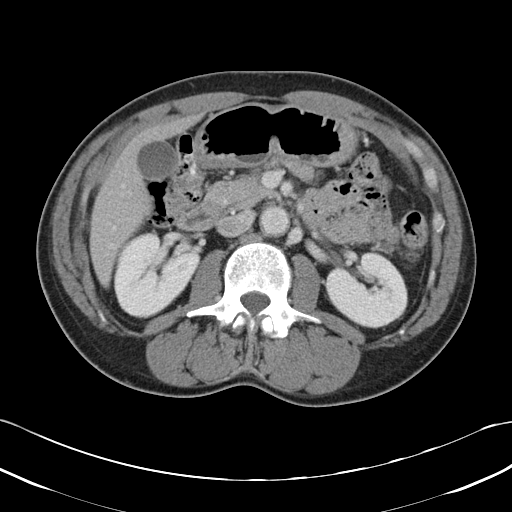
[im 62/85  soft-tissue]
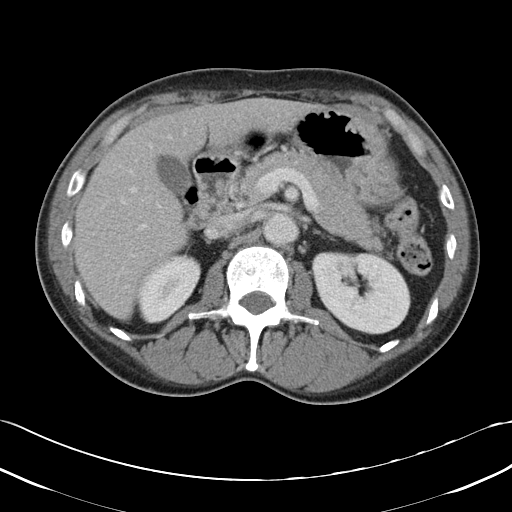
[im 68/85  soft-tissue]
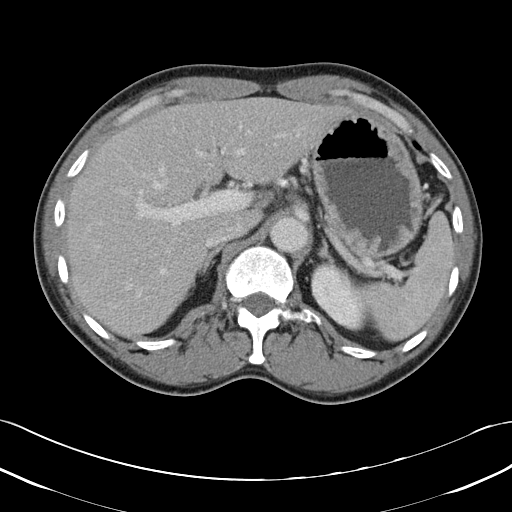
[im 73/85  soft-tissue]
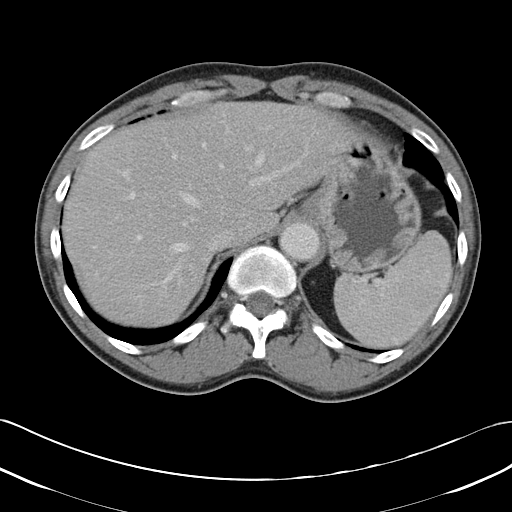
[im 79/85  soft-tissue]
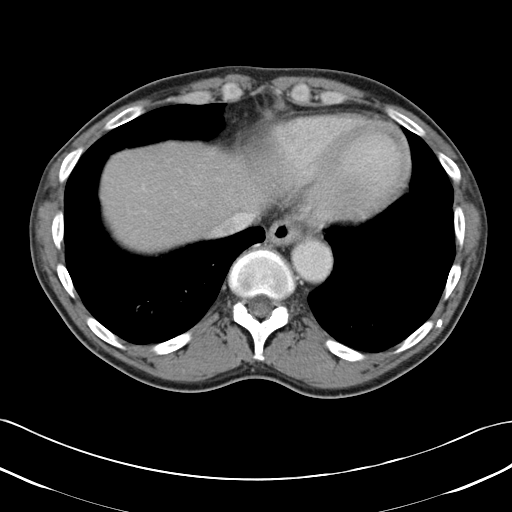

[Series 602: <mpr thick range> · coronal · 0.83mm/px · 3 of 82 slices shown]
[im 28/82  soft-tissue]
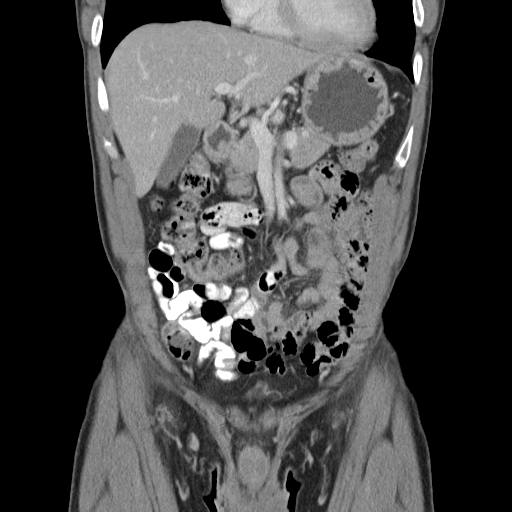
[im 37/82  soft-tissue]
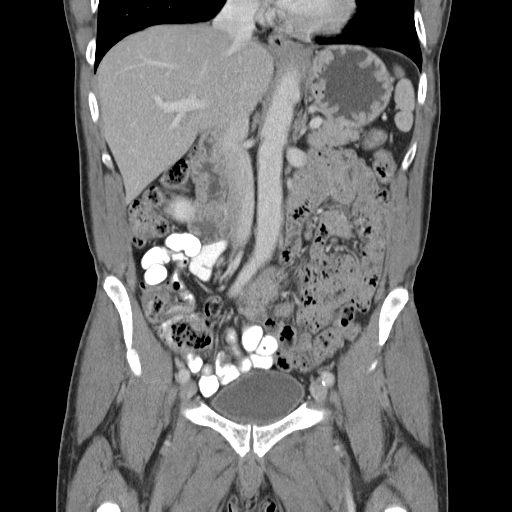
[im 46/82  soft-tissue]
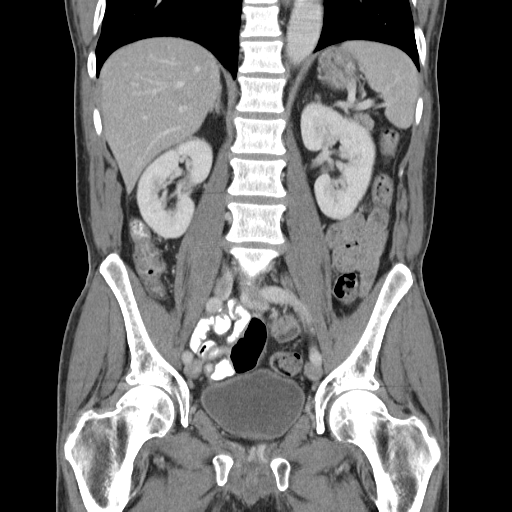

[17 of 46 positions shown; findings below may reference images not displayed]

FINDINGS: Clear lung bases.  Mild cardiomegaly without pericardial
or pleural effusion.  Normal liver, spleen, stomach, pancreas,
gallbladder, biliary tract, adrenal glands, kidneys.

No retroperitoneal or retrocrural adenopathy.

The low anal primary is relatively occult on CT.
No extension into the ischioanal fossa.  No obstruction.  No wall
thickening within the rectum.  Remainder of the colon is normal.
Normal terminal ileum and appendix.
Normal small bowel without abdominal ascites.

Minimal right external iliac nodal tissue on image 61 corresponds
to mild hypermetabolism at PET.  Right inguinal node measures
cm on image 75 corresponds to mild hypermetabolism at PET.
Normal urinary bladder and prostate without significant free pelvic
fluid.
No acute osseous abnormality.
IMPRESSION: 1.  The low anal primary is relatively CT occult.  Better evaluated
on today's PET.
2.  Small bilateral inguinal and right external iliac nodes, as
detailed on PET.
3.  Otherwise, no evidence of metastatic disease within the abdomen
or pelvis.

## 2011-03-21 MED ORDER — IOHEXOL 300 MG/ML  SOLN
100.0000 mL | Freq: Once | INTRAMUSCULAR | Status: AC | PRN
  Administered 2011-03-21: 100 mL via INTRAVENOUS

## 2011-03-21 MED ORDER — FLUDEOXYGLUCOSE F - 18 (FDG) INJECTION
15.5000 | Freq: Once | INTRAVENOUS | Status: AC | PRN
  Administered 2011-03-21: 15.5 via INTRAVENOUS

## 2011-03-24 ENCOUNTER — Encounter (HOSPITAL_BASED_OUTPATIENT_CLINIC_OR_DEPARTMENT_OTHER): Payer: Medicaid Other | Admitting: Oncology

## 2011-03-24 ENCOUNTER — Other Ambulatory Visit: Payer: Self-pay | Admitting: Oncology

## 2011-03-24 DIAGNOSIS — C211 Malignant neoplasm of anal canal: Secondary | ICD-10-CM

## 2011-03-24 DIAGNOSIS — B20 Human immunodeficiency virus [HIV] disease: Secondary | ICD-10-CM

## 2011-03-24 DIAGNOSIS — C210 Malignant neoplasm of anus, unspecified: Secondary | ICD-10-CM

## 2011-03-24 LAB — COMPREHENSIVE METABOLIC PANEL
ALT: 42 U/L (ref 0–53)
AST: 47 U/L — ABNORMAL HIGH (ref 0–37)
Albumin: 3.8 g/dL (ref 3.5–5.2)
Alkaline Phosphatase: 81 U/L (ref 39–117)
BUN: 10 mg/dL (ref 6–23)
CO2: 25 mEq/L (ref 19–32)
Calcium: 9.8 mg/dL (ref 8.4–10.5)
Chloride: 103 mEq/L (ref 96–112)
Creatinine, Ser: 1.07 mg/dL (ref 0.50–1.35)
Glucose, Bld: 82 mg/dL (ref 70–99)
Potassium: 3.7 mEq/L (ref 3.5–5.3)
Sodium: 137 mEq/L (ref 135–145)
Total Bilirubin: 0.4 mg/dL (ref 0.3–1.2)
Total Protein: 8.5 g/dL — ABNORMAL HIGH (ref 6.0–8.3)

## 2011-03-24 LAB — CBC WITH DIFFERENTIAL/PLATELET
BASO%: 0.2 % (ref 0.0–2.0)
Basophils Absolute: 0 10*3/uL (ref 0.0–0.1)
EOS%: 0.6 % (ref 0.0–7.0)
Eosinophils Absolute: 0 10*3/uL (ref 0.0–0.5)
HCT: 40.5 % (ref 38.4–49.9)
HGB: 13.6 g/dL (ref 13.0–17.1)
LYMPH%: 21.8 % (ref 14.0–49.0)
MCH: 29.9 pg (ref 27.2–33.4)
MCHC: 33.4 g/dL (ref 32.0–36.0)
MCV: 89.5 fL (ref 79.3–98.0)
MONO#: 0.6 10*3/uL (ref 0.1–0.9)
MONO%: 12.8 % (ref 0.0–14.0)
NEUT#: 3 10*3/uL (ref 1.5–6.5)
NEUT%: 64.6 % (ref 39.0–75.0)
Platelets: 306 10*3/uL (ref 140–400)
RBC: 4.53 10*6/uL (ref 4.20–5.82)
RDW: 13.6 % (ref 11.0–14.6)
WBC: 4.7 10*3/uL (ref 4.0–10.3)
lymph#: 1 10*3/uL (ref 0.9–3.3)

## 2011-03-28 ENCOUNTER — Encounter (INDEPENDENT_AMBULATORY_CARE_PROVIDER_SITE_OTHER): Payer: Self-pay | Admitting: General Surgery

## 2011-03-30 ENCOUNTER — Ambulatory Visit (HOSPITAL_COMMUNITY): Payer: Medicaid Other

## 2011-04-03 ENCOUNTER — Encounter: Payer: Medicaid Other | Admitting: Oncology

## 2011-04-11 ENCOUNTER — Other Ambulatory Visit: Payer: Self-pay | Admitting: Oncology

## 2011-04-11 ENCOUNTER — Encounter (HOSPITAL_BASED_OUTPATIENT_CLINIC_OR_DEPARTMENT_OTHER): Payer: Medicaid Other | Admitting: Oncology

## 2011-04-11 DIAGNOSIS — K625 Hemorrhage of anus and rectum: Secondary | ICD-10-CM

## 2011-04-11 DIAGNOSIS — B20 Human immunodeficiency virus [HIV] disease: Secondary | ICD-10-CM

## 2011-04-11 DIAGNOSIS — C211 Malignant neoplasm of anal canal: Secondary | ICD-10-CM

## 2011-04-11 LAB — CBC WITH DIFFERENTIAL/PLATELET
BASO%: 0.2 % (ref 0.0–2.0)
Basophils Absolute: 0 10*3/uL (ref 0.0–0.1)
EOS%: 2.4 % (ref 0.0–7.0)
Eosinophils Absolute: 0.1 10*3/uL (ref 0.0–0.5)
HCT: 39.9 % (ref 38.4–49.9)
HGB: 13.7 g/dL (ref 13.0–17.1)
LYMPH%: 26.5 % (ref 14.0–49.0)
MCH: 30.4 pg (ref 27.2–33.4)
MCHC: 34.4 g/dL (ref 32.0–36.0)
MCV: 88.5 fL (ref 79.3–98.0)
MONO#: 0.5 10*3/uL (ref 0.1–0.9)
MONO%: 12.5 % (ref 0.0–14.0)
NEUT#: 2.3 10*3/uL (ref 1.5–6.5)
NEUT%: 58.4 % (ref 39.0–75.0)
Platelets: 273 10*3/uL (ref 140–400)
RBC: 4.51 10*6/uL (ref 4.20–5.82)
RDW: 13.1 % (ref 11.0–14.6)
WBC: 3.9 10*3/uL — ABNORMAL LOW (ref 4.0–10.3)
lymph#: 1 10*3/uL (ref 0.9–3.3)

## 2011-04-11 LAB — COMPREHENSIVE METABOLIC PANEL
ALT: 46 U/L (ref 0–53)
AST: 64 U/L — ABNORMAL HIGH (ref 0–37)
Albumin: 4.4 g/dL (ref 3.5–5.2)
Alkaline Phosphatase: 81 U/L (ref 39–117)
BUN: 13 mg/dL (ref 6–23)
CO2: 22 mEq/L (ref 19–32)
Calcium: 9.5 mg/dL (ref 8.4–10.5)
Chloride: 106 mEq/L (ref 96–112)
Creatinine, Ser: 0.87 mg/dL (ref 0.50–1.35)
Glucose, Bld: 62 mg/dL — ABNORMAL LOW (ref 70–99)
Potassium: 3.7 mEq/L (ref 3.5–5.3)
Sodium: 140 mEq/L (ref 135–145)
Total Bilirubin: 0.3 mg/dL (ref 0.3–1.2)
Total Protein: 8.1 g/dL (ref 6.0–8.3)

## 2011-05-18 ENCOUNTER — Other Ambulatory Visit: Payer: Self-pay | Admitting: *Deleted

## 2011-05-18 DIAGNOSIS — B20 Human immunodeficiency virus [HIV] disease: Secondary | ICD-10-CM

## 2011-05-18 MED ORDER — FLUCONAZOLE 200 MG PO TABS: 200.0000 mg | ORAL_TABLET | Freq: Every day | ORAL | Status: DC

## 2011-06-06 ENCOUNTER — Telehealth: Payer: Self-pay | Admitting: *Deleted

## 2011-06-06 NOTE — Telephone Encounter (Signed)
[name] with [facility] called & said she has faxed & mailed orders to be signed for this pt. Told her they are in md  In box. I will ask him to sign when he returns next week. Her # is [phone]

## 2011-06-22 ENCOUNTER — Encounter: Payer: Self-pay | Admitting: Radiation Oncology

## 2011-06-26 DIAGNOSIS — B20 Human immunodeficiency virus [HIV] disease: Secondary | ICD-10-CM

## 2011-06-27 ENCOUNTER — Telehealth: Payer: Self-pay | Admitting: *Deleted

## 2011-06-27 ENCOUNTER — Ambulatory Visit
Admission: RE | Admit: 2011-06-27 | Discharge: 2011-06-27 | Payer: Medicaid Other | Source: Ambulatory Visit | Attending: Radiation Oncology | Admitting: Radiation Oncology

## 2011-06-27 ENCOUNTER — Ambulatory Visit (INDEPENDENT_AMBULATORY_CARE_PROVIDER_SITE_OTHER): Payer: Medicaid Other | Admitting: Licensed Clinical Social Worker

## 2011-06-27 ENCOUNTER — Other Ambulatory Visit: Payer: Self-pay | Admitting: Infectious Disease

## 2011-06-27 ENCOUNTER — Other Ambulatory Visit: Payer: Self-pay | Admitting: Licensed Clinical Social Worker

## 2011-06-27 ENCOUNTER — Other Ambulatory Visit (INDEPENDENT_AMBULATORY_CARE_PROVIDER_SITE_OTHER): Payer: Medicaid Other

## 2011-06-27 VITALS — BP 151/77 | HR 67 | Temp 98.1°F | Wt 151.6 lb

## 2011-06-27 DIAGNOSIS — L309 Dermatitis, unspecified: Secondary | ICD-10-CM

## 2011-06-27 DIAGNOSIS — Z23 Encounter for immunization: Secondary | ICD-10-CM

## 2011-06-27 DIAGNOSIS — B20 Human immunodeficiency virus [HIV] disease: Secondary | ICD-10-CM

## 2011-06-27 DIAGNOSIS — K6289 Other specified diseases of anus and rectum: Secondary | ICD-10-CM

## 2011-06-27 DIAGNOSIS — Z113 Encounter for screening for infections with a predominantly sexual mode of transmission: Secondary | ICD-10-CM

## 2011-06-27 MED ORDER — TRIAMCINOLONE ACETONIDE 0.1 % EX CREA: TOPICAL_CREAM | Freq: Two times a day (BID) | CUTANEOUS | Status: DC

## 2011-06-28 ENCOUNTER — Ambulatory Visit: Payer: Medicaid Other | Admitting: Radiation Oncology

## 2011-06-28 LAB — T-HELPER CELL (CD4) - (RCID CLINIC ONLY)
CD4 % Helper T Cell: 17 % — ABNORMAL LOW (ref 33–55)
CD4 T Cell Abs: 120 uL — ABNORMAL LOW (ref 400–2700)

## 2011-07-04 ENCOUNTER — Ambulatory Visit: Admission: RE | Admit: 2011-07-04 | Payer: Medicaid Other | Source: Ambulatory Visit | Admitting: Radiation Oncology

## 2011-08-09 ENCOUNTER — Ambulatory Visit (INDEPENDENT_AMBULATORY_CARE_PROVIDER_SITE_OTHER): Payer: Medicaid Other | Admitting: Infectious Disease

## 2011-08-09 ENCOUNTER — Encounter: Payer: Self-pay | Admitting: Infectious Disease

## 2011-08-09 VITALS — BP 142/95 | HR 74 | Temp 98.3°F | Ht 70.0 in | Wt 157.5 lb

## 2011-08-09 DIAGNOSIS — R141 Gas pain: Secondary | ICD-10-CM

## 2011-08-09 DIAGNOSIS — E291 Testicular hypofunction: Secondary | ICD-10-CM

## 2011-08-09 DIAGNOSIS — IMO0001 Reserved for inherently not codable concepts without codable children: Secondary | ICD-10-CM

## 2011-08-09 DIAGNOSIS — R143 Flatulence: Secondary | ICD-10-CM

## 2011-08-09 DIAGNOSIS — K645 Perianal venous thrombosis: Secondary | ICD-10-CM

## 2011-08-09 DIAGNOSIS — B20 Human immunodeficiency virus [HIV] disease: Secondary | ICD-10-CM

## 2011-08-09 DIAGNOSIS — R21 Rash and other nonspecific skin eruption: Secondary | ICD-10-CM

## 2011-08-09 DIAGNOSIS — I10 Essential (primary) hypertension: Secondary | ICD-10-CM

## 2011-08-09 DIAGNOSIS — R197 Diarrhea, unspecified: Secondary | ICD-10-CM

## 2011-08-09 DIAGNOSIS — Z113 Encounter for screening for infections with a predominantly sexual mode of transmission: Secondary | ICD-10-CM

## 2011-08-09 DIAGNOSIS — C210 Malignant neoplasm of anus, unspecified: Secondary | ICD-10-CM | POA: Insufficient documentation

## 2011-08-09 MED ORDER — DIPHENOXYLATE-ATROPINE 2.5-0.025 MG PO TABS: 1.0000 | ORAL_TABLET | Freq: Four times a day (QID) | ORAL | Status: DC | PRN

## 2011-08-09 NOTE — Assessment & Plan Note (Signed)
Try otc meds, lomotil refilled for diarrhea

## 2011-08-09 NOTE — Assessment & Plan Note (Signed)
Followed by

## 2011-08-09 NOTE — Progress Notes (Signed)
  Subjective:    Patient ID: [name], male    DOB: 15-Nov-1955, 56 y.o.   MRN: [id number]  HPI  [name] is a 56 y.o. 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 He is feeling relatively well without new complaints today. He walked to [facility] this am though clinic appt was for pm. He was worked in the am. He admits to having come off his ARV at the time he had the higher viral load. He claims to be complliant again. He c/o gas, bloating and loose stools. Review of Systems  Constitutional: Negative for fever, chills, diaphoresis, activity change, appetite change, fatigue and unexpected weight change.  HENT: Negative for congestion, sore throat, rhinorrhea, sneezing, trouble swallowing and sinus pressure.   Eyes: Negative for photophobia and visual disturbance.  Respiratory: Negative for cough, chest tightness, shortness of breath, wheezing and stridor.   Cardiovascular: Negative for chest pain, palpitations and leg swelling.  Gastrointestinal: Positive for abdominal distention. Negative for nausea, vomiting, abdominal pain, diarrhea, constipation, blood in stool and anal bleeding.  Genitourinary: Negative for dysuria, hematuria, flank pain and difficulty urinating.  Musculoskeletal: Negative for myalgias, back pain, joint swelling, arthralgias and gait problem.  Skin: Negative for color change, pallor, rash and wound.  Neurological: Negative for dizziness, tremors, weakness and light-headedness.  Hematological: Negative for adenopathy. Does not bruise/bleed easily.  Psychiatric/Behavioral: Negative for behavioral problems, confusion, sleep disturbance, dysphoric mood, decreased concentration and agitation.       Objective:   Physical Exam  Constitutional: He is oriented to person, place, and time. He appears well-nourished. No distress.  HENT:  Head:  Normocephalic and atraumatic.  Mouth/Throat: Oropharynx is clear and moist. No oropharyngeal exudate.  Eyes: Conjunctivae and EOM are normal. Pupils are equal, round, and reactive to light. No scleral icterus.  Neck: Normal range of motion. Neck supple. No JVD present.  Cardiovascular: Normal rate, regular rhythm and normal heart sounds.  Exam reveals no gallop and no friction rub.   No murmur heard. Pulmonary/Chest: Effort normal and breath sounds normal. No respiratory distress. He has no wheezes. He has no rales. He exhibits no tenderness.  Abdominal: He exhibits no distension and no mass. There is no tenderness. There is no rebound and no guarding.  Musculoskeletal: He exhibits no edema and no tenderness.  Lymphadenopathy:    He has no cervical adenopathy.  Neurological: He is alert and oriented to person, place, and time. He has normal reflexes. He exhibits normal muscle tone. Coordination normal.  Skin: Skin is warm and dry. He is not diaphoretic. No erythema. No pallor.  Psychiatric: He has a normal mood and affect. His behavior is normal. Judgment and thought content normal.          Assessment & Plan:

## 2011-08-09 NOTE — Assessment & Plan Note (Signed)
Recheck viral load and cd4 today

## 2011-08-09 NOTE — Assessment & Plan Note (Signed)
Sp XRT. Needs to make fu appt with radiation oncology

## 2011-08-09 NOTE — Assessment & Plan Note (Signed)
On testosterone 

## 2011-08-09 NOTE — Assessment & Plan Note (Signed)
Reasonable control. 

## 2011-08-10 LAB — T-HELPER CELL (CD4) - (RCID CLINIC ONLY)
CD4 % Helper T Cell: 13 % — ABNORMAL LOW (ref 33–55)
CD4 T Cell Abs: 100 uL — ABNORMAL LOW (ref 400–2700)

## 2011-08-25 ENCOUNTER — Encounter: Payer: Self-pay | Admitting: Radiation Oncology

## 2011-08-25 NOTE — Progress Notes (Signed)
08/24/2011 at 1341 Faxed completed REFILL AUTHORIZATION REQUEST FORM for Tamsulosin 0.4 mg to [facility] as directed by Dr. Dayton Scrape. Fax number [phone]. Delivery confirmation fax obtained.

## 2011-08-30 ENCOUNTER — Other Ambulatory Visit: Payer: Self-pay | Admitting: Infectious Disease

## 2011-08-30 DIAGNOSIS — Z79899 Other long term (current) drug therapy: Secondary | ICD-10-CM

## 2011-09-01 ENCOUNTER — Other Ambulatory Visit: Payer: Self-pay | Admitting: Licensed Clinical Social Worker

## 2011-09-01 DIAGNOSIS — B20 Human immunodeficiency virus [HIV] disease: Secondary | ICD-10-CM

## 2011-09-01 MED ORDER — ENSURE PO LIQD: 237.0000 mL | Freq: Three times a day (TID) | ORAL | Status: DC

## 2011-09-11 ENCOUNTER — Other Ambulatory Visit: Payer: Medicaid Other

## 2011-09-25 ENCOUNTER — Ambulatory Visit (INDEPENDENT_AMBULATORY_CARE_PROVIDER_SITE_OTHER): Payer: Medicaid Other | Admitting: Infectious Disease

## 2011-09-25 ENCOUNTER — Other Ambulatory Visit: Payer: Self-pay | Admitting: *Deleted

## 2011-09-25 ENCOUNTER — Encounter: Payer: Self-pay | Admitting: Infectious Disease

## 2011-09-25 VITALS — BP 143/87 | HR 73 | Temp 97.6°F | Wt 159.0 lb

## 2011-09-25 DIAGNOSIS — B1710 Acute hepatitis C without hepatic coma: Secondary | ICD-10-CM

## 2011-09-25 DIAGNOSIS — C210 Malignant neoplasm of anus, unspecified: Secondary | ICD-10-CM

## 2011-09-25 DIAGNOSIS — L309 Dermatitis, unspecified: Secondary | ICD-10-CM

## 2011-09-25 DIAGNOSIS — B20 Human immunodeficiency virus [HIV] disease: Secondary | ICD-10-CM

## 2011-09-25 DIAGNOSIS — L259 Unspecified contact dermatitis, unspecified cause: Secondary | ICD-10-CM

## 2011-09-25 LAB — CBC WITH DIFFERENTIAL/PLATELET
Basophils Absolute: 0 10*3/uL (ref 0.0–0.1)
Basophils Relative: 0 % (ref 0–1)
Eosinophils Absolute: 0.1 10*3/uL (ref 0.0–0.7)
Eosinophils Relative: 3 % (ref 0–5)
HCT: 41.8 % (ref 39.0–52.0)
Hemoglobin: 13.3 g/dL (ref 13.0–17.0)
Lymphocytes Relative: 23 % (ref 12–46)
Lymphs Abs: 0.7 10*3/uL (ref 0.7–4.0)
MCH: 28.9 pg (ref 26.0–34.0)
MCHC: 31.8 g/dL (ref 30.0–36.0)
MCV: 90.9 fL (ref 78.0–100.0)
Monocytes Absolute: 0.4 10*3/uL (ref 0.1–1.0)
Monocytes Relative: 13 % — ABNORMAL HIGH (ref 3–12)
Neutro Abs: 1.9 10*3/uL (ref 1.7–7.7)
Neutrophils Relative %: 61 % (ref 43–77)
Platelets: 242 10*3/uL (ref 150–400)
RBC: 4.6 MIL/uL (ref 4.22–5.81)
RDW: 14.7 % (ref 11.5–15.5)
WBC: 3.1 10*3/uL — ABNORMAL LOW (ref 4.0–10.5)

## 2011-09-25 LAB — LIPID PANEL
Cholesterol: 117 mg/dL (ref 0–200)
HDL: 45 mg/dL (ref >39)
LDL Cholesterol: 49 mg/dL (ref 0–99)
Total CHOL/HDL Ratio: 2.6 ratio
Triglycerides: 116 mg/dL (ref <150)
VLDL: 23 mg/dL (ref 0–40)

## 2011-09-25 LAB — COMPLETE METABOLIC PANEL WITH GFR
ALT: 29 U/L (ref 0–53)
AST: 33 U/L (ref 0–37)
Albumin: 3.9 g/dL (ref 3.5–5.2)
Alkaline Phosphatase: 73 U/L (ref 39–117)
BUN: 9 mg/dL (ref 6–23)
CO2: 22 mEq/L (ref 19–32)
Calcium: 8.9 mg/dL (ref 8.4–10.5)
Chloride: 105 mEq/L (ref 96–112)
Creat: 0.96 mg/dL (ref 0.50–1.35)
GFR, Est African American: >89 mL/min
GFR, Est Non African American: 88 mL/min
Glucose, Bld: 107 mg/dL — ABNORMAL HIGH (ref 70–99)
Potassium: 4 mEq/L (ref 3.5–5.3)
Sodium: 140 mEq/L (ref 135–145)
Total Bilirubin: 0.3 mg/dL (ref 0.3–1.2)
Total Protein: 7.3 g/dL (ref 6.0–8.3)

## 2011-09-25 MED ORDER — DARUNAVIR ETHANOLATE 800 MG PO TABS: 800.0000 mg | ORAL_TABLET | Freq: Every day | ORAL | Status: DC

## 2011-09-25 MED ORDER — EMTRICITABINE-TENOFOVIR DF 200-300 MG PO TABS: 1.0000 | ORAL_TABLET | Freq: Every day | ORAL | Status: DC

## 2011-09-25 MED ORDER — TRIAMCINOLONE ACETONIDE 0.1 % EX CREA: TOPICAL_CREAM | Freq: Two times a day (BID) | CUTANEOUS | Status: AC

## 2011-09-25 MED ORDER — RITONAVIR 100 MG PO CAPS: 100.0000 mg | ORAL_CAPSULE | Freq: Every day | ORAL | Status: DC

## 2011-09-25 NOTE — Assessment & Plan Note (Signed)
Needs to make fu with radiation oncologist

## 2011-09-25 NOTE — Progress Notes (Signed)
  Subjective:    Patient ID: [name], male    DOB: Jul 07, 1956, 56 y.o.   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 He states that he missed several doses of his ARVs when he was getting XRT therapy this fall but now has resumed them and denies missing doses. He has no other specific complaints today   Review of Systems  Constitutional: Negative for fever, chills, diaphoresis, activity change, appetite change, fatigue and unexpected weight change.  HENT: Negative for congestion, sore throat, rhinorrhea, sneezing, trouble swallowing and sinus pressure.   Eyes: Negative for photophobia and visual disturbance.  Respiratory: Negative for cough, chest tightness, shortness of breath, wheezing and stridor.   Cardiovascular: Negative for chest pain, palpitations and leg swelling.  Gastrointestinal: Negative for nausea, vomiting, abdominal pain, diarrhea, constipation, blood in stool, abdominal distention and anal bleeding.  Genitourinary: Negative for dysuria, hematuria, flank pain and difficulty urinating.  Musculoskeletal: Negative for myalgias, back pain, joint swelling, arthralgias and gait problem.  Skin: Positive for rash. Negative for color change, pallor and wound.  Neurological: Negative for dizziness, tremors, weakness and light-headedness.  Hematological: Negative for adenopathy. Does not bruise/bleed easily.  Psychiatric/Behavioral: Negative for behavioral problems, confusion, sleep disturbance, dysphoric mood, decreased concentration and agitation.       Objective:   Physical Exam  Constitutional: He is oriented to person, place, and time. No distress.  HENT:  Head: Normocephalic and atraumatic.  Mouth/Throat: Oropharynx is clear and moist. No oropharyngeal exudate.  Eyes: Conjunctivae and EOM are normal.  Pupils are equal, round, and reactive to light. No scleral icterus.  Neck: Normal range of motion. Neck supple. No JVD present.  Cardiovascular: Normal rate, regular rhythm and normal heart sounds.  Exam reveals no gallop and no friction rub.   No murmur heard. Pulmonary/Chest: Effort normal and breath sounds normal. No respiratory distress. He has no wheezes. He has no rales. He exhibits no tenderness.  Abdominal: He exhibits no distension and no mass. There is no tenderness. There is no rebound and no guarding.  Musculoskeletal: He exhibits no edema and no tenderness.  Lymphadenopathy:    He has no cervical adenopathy.  Neurological: He is alert and oriented to person, place, and time. He has normal reflexes. He exhibits normal muscle tone. Coordination normal.  Skin: Skin is warm and dry. He is not diaphoretic. No erythema. No pallor.  Psychiatric: He has a normal mood and affect. His behavior is normal. Judgment and thought content normal.          Assessment & Plan:  HIV DISEASE Continue prezista norvir and truvada, bactrim for PCP prophylaxis  Squamous cell carcinoma of anus Needs to make fu with radiation oncologist  HEPATITIS C Not a good candidate for current therapies

## 2011-09-25 NOTE — Assessment & Plan Note (Signed)
Not a good candidate for current therapies

## 2011-09-25 NOTE — Patient Instructions (Signed)
With labs

## 2011-09-25 NOTE — Assessment & Plan Note (Signed)
Continue prezista norvir and truvada, bactrim for PCP prophylaxis

## 2011-09-26 LAB — HIV-1 RNA QUANT-NO REFLEX-BLD
HIV 1 RNA Quant: 21 copies/mL (ref <20)
HIV-1 RNA Quant, Log: 1.32 {Log} — ABNORMAL HIGH (ref <1.30)

## 2011-09-26 LAB — T-HELPER CELL (CD4) - (RCID CLINIC ONLY)
CD4 % Helper T Cell: 14 % — ABNORMAL LOW (ref 33–55)
CD4 T Cell Abs: 90 uL — ABNORMAL LOW (ref 400–2700)

## 2011-09-27 ENCOUNTER — Telehealth: Payer: Self-pay | Admitting: Licensed Clinical Social Worker

## 2011-09-27 LAB — VITAMIN D 1,25 DIHYDROXY
Vitamin D 1, 25 (OH)2 Total: 59 pg/mL (ref 18–72)
Vitamin D2 1, 25 (OH)2: <8 pg/mL
Vitamin D3 1, 25 (OH)2: 59 pg/mL

## 2011-09-27 NOTE — Telephone Encounter (Signed)
Spoke with Bjorn Loser today about this patient and she would like a script for pull ups for this [facility]. On the script it MUST have the diagnosis for incontinence on the script. Will forward this to Dr .Daiva Eves so he can write it when he returns to clinic on Monday 10/02/2011.

## 2011-09-28 ENCOUNTER — Encounter: Payer: Self-pay | Admitting: Licensed Clinical Social Worker

## 2011-09-28 DIAGNOSIS — R159 Full incontinence of feces: Secondary | ICD-10-CM | POA: Insufficient documentation

## 2011-09-28 NOTE — Telephone Encounter (Signed)
I got it and faxed it to [facility]

## 2011-09-28 NOTE — Telephone Encounter (Signed)
I spoke with Dr. Daiva Eves and he wrote the script for this patient to have adult diapers every month for his incontinence. I called [facility] and they stated medicaid would cover up to 200 pull ups and or 190 diapers. I faxed the information they requested to [facility] commercial department. They said that they would process it and ship out the diapers to the patient every month once his medicaid is verified and approved.

## 2011-09-28 NOTE — Telephone Encounter (Signed)
I left the script for him and left it on your desk. Thanks Tamika!

## 2011-09-29 NOTE — Telephone Encounter (Signed)
xxx

## 2011-10-05 ENCOUNTER — Other Ambulatory Visit: Payer: Self-pay | Admitting: *Deleted

## 2011-10-05 DIAGNOSIS — R05 Cough: Secondary | ICD-10-CM

## 2011-10-05 DIAGNOSIS — R059 Cough, unspecified: Secondary | ICD-10-CM

## 2011-10-05 MED ORDER — OMEPRAZOLE 40 MG PO CPDR: 40.0000 mg | DELAYED_RELEASE_CAPSULE | Freq: Every day | ORAL | Status: DC

## 2011-10-06 ENCOUNTER — Telehealth: Payer: Self-pay | Admitting: Oncology

## 2011-10-06 NOTE — Telephone Encounter (Signed)
Received message from Jaquita Folds home care nurse/case manager for pt. Per Heide Guile missed his last appt and he is trying to get him back in to see FS. Per Heide Guile has no working phone and if we call him w/the appt he will give it to Aldine. Message sent to FS to see when he can see pt. Leonor Liv can be reached @ [phone].

## 2011-10-06 NOTE — Telephone Encounter (Signed)
Per response from FS pt needs an appt w/dr Dayton Scrape in radonc he does not need to f/u w/medonc. lmonvm for holt - case manger/home care nurse [phone]) re above w/# for radonc.

## 2011-10-17 ENCOUNTER — Ambulatory Visit
Admission: RE | Admit: 2011-10-17 | Discharge: 2011-10-17 | Disposition: A | Discharge status: Home / Self Care | Payer: Medicaid Other | Source: Ambulatory Visit | Attending: Radiation Oncology | Admitting: Radiation Oncology

## 2011-10-17 ENCOUNTER — Encounter: Payer: Self-pay | Admitting: Radiation Oncology

## 2011-10-17 VITALS — BP 129/83 | HR 76 | Temp 97.6°F | Resp 18 | Wt 159.2 lb

## 2011-10-17 DIAGNOSIS — C210 Malignant neoplasm of anus, unspecified: Secondary | ICD-10-CM

## 2011-10-17 NOTE — Progress Notes (Signed)
Followup note:  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 He discontinued his planned course of chemoradiation earlier and prescribed and he missed numerous treatments resulting in a lower  biological effective dose. He has not seen anyone from Gen. surgery or Dr. Gaylyn Rong of medical oncology in the recent past. Dr. Zachery Dakins has since retired. He sees Dr. Algis Liming at [facility] outpatient clinic. He is doing well from a GU and GI standpoint although he does have intermittent diarrhea. He has not noted any rectal bleeding, but he does wear an adult diaper for slight anorectal incontinence.  Physical examination: Nodes: Without palpable cervical, supraclavicular, or inguinal lymphadenopathy. Chest: Lungs clear.  Abdomen: Soft without masses or organomegaly. Rectal: There is  felt to be scarring along the anterior anal canal without any mass effect to suggest persistent disease.  Impression: I believe that he's had a complete response. This could be confirmed by PET scan, the patient is not interested in doing anything else at this time. I explained him that if he does have persistent disease then he would be a candidate for a salvage APR. At the very least, feel that he should be seen by general surgery for a endoscopy with biopsy of his anterior anal canal.  Plan: I gave [name] the phone number for [facility] to make a followup appointment with whoever is following Dr. Zachery Dakins is patients. Followup visit here in 3 months.

## 2011-10-17 NOTE — Progress Notes (Signed)
Patient presents to the clinic today unaccompanied for a follow up appointment with Dr. Dayton Scrape. Patient is alert and oriented to person, place, and time. No distress noted. Steady gait noted. Pleasant affect noted. Patient denies pain at this time. Patient denies headache, dizziness, nausea, or vomiting. Patient reports diarrhea. Patient reports on occasion he is incontinent of stool. Patient reports that he wears depends and takes Imodium to control the incontinence. Patient reports having 3-4 episodes of diarrhea a day. Patient reports foul frequent flatus. Reported all findings to Dr. Dayton Scrape.

## 2011-12-26 ENCOUNTER — Other Ambulatory Visit: Payer: Self-pay | Admitting: *Deleted

## 2011-12-26 DIAGNOSIS — B20 Human immunodeficiency virus [HIV] disease: Secondary | ICD-10-CM

## 2011-12-26 MED ORDER — FLUCONAZOLE 200 MG PO TABS: 200.0000 mg | ORAL_TABLET | Freq: Every day | ORAL | Status: DC

## 2011-12-27 ENCOUNTER — Encounter: Payer: Self-pay | Admitting: Radiation Oncology

## 2011-12-27 DIAGNOSIS — Z923 Personal history of irradiation: Secondary | ICD-10-CM | POA: Insufficient documentation

## 2012-01-02 DIAGNOSIS — C801 Malignant (primary) neoplasm, unspecified: Secondary | ICD-10-CM | POA: Insufficient documentation

## 2012-01-09 ENCOUNTER — Ambulatory Visit: Payer: Medicaid Other | Attending: Radiation Oncology | Admitting: Radiation Oncology

## 2012-03-26 ENCOUNTER — Other Ambulatory Visit (INDEPENDENT_AMBULATORY_CARE_PROVIDER_SITE_OTHER): Payer: Medicaid Other

## 2012-03-26 DIAGNOSIS — C210 Malignant neoplasm of anus, unspecified: Secondary | ICD-10-CM

## 2012-03-26 DIAGNOSIS — R21 Rash and other nonspecific skin eruption: Secondary | ICD-10-CM

## 2012-03-26 DIAGNOSIS — Z79899 Other long term (current) drug therapy: Secondary | ICD-10-CM

## 2012-03-26 DIAGNOSIS — IMO0001 Reserved for inherently not codable concepts without codable children: Secondary | ICD-10-CM

## 2012-03-26 DIAGNOSIS — Z113 Encounter for screening for infections with a predominantly sexual mode of transmission: Secondary | ICD-10-CM

## 2012-03-26 DIAGNOSIS — R197 Diarrhea, unspecified: Secondary | ICD-10-CM

## 2012-03-26 DIAGNOSIS — E291 Testicular hypofunction: Secondary | ICD-10-CM

## 2012-03-26 DIAGNOSIS — K645 Perianal venous thrombosis: Secondary | ICD-10-CM

## 2012-03-26 DIAGNOSIS — B20 Human immunodeficiency virus [HIV] disease: Secondary | ICD-10-CM

## 2012-03-26 DIAGNOSIS — I10 Essential (primary) hypertension: Secondary | ICD-10-CM

## 2012-03-26 LAB — LIPID PANEL
Cholesterol: 134 mg/dL (ref 0–200)
HDL: 48 mg/dL (ref >39)
LDL Cholesterol: 55 mg/dL (ref 0–99)
Total CHOL/HDL Ratio: 2.8 Ratio
Triglycerides: 156 mg/dL — ABNORMAL HIGH (ref <150)
VLDL: 31 mg/dL (ref 0–40)

## 2012-03-26 LAB — CBC WITH DIFFERENTIAL/PLATELET
Basophils Absolute: 0 10*3/uL (ref 0.0–0.1)
Basophils Relative: 1 % (ref 0–1)
Eosinophils Absolute: 0.1 10*3/uL (ref 0.0–0.7)
Eosinophils Relative: 4 % (ref 0–5)
HCT: 41.1 % (ref 39.0–52.0)
Hemoglobin: 14.2 g/dL (ref 13.0–17.0)
Lymphocytes Relative: 23 % (ref 12–46)
Lymphs Abs: 0.8 10*3/uL (ref 0.7–4.0)
MCH: 30.3 pg (ref 26.0–34.0)
MCHC: 34.5 g/dL (ref 30.0–36.0)
MCV: 87.6 fL (ref 78.0–100.0)
Monocytes Absolute: 0.4 10*3/uL (ref 0.1–1.0)
Monocytes Relative: 13 % — ABNORMAL HIGH (ref 3–12)
Neutro Abs: 2 10*3/uL (ref 1.7–7.7)
Neutrophils Relative %: 59 % (ref 43–77)
Platelets: 233 10*3/uL (ref 150–400)
RBC: 4.69 MIL/uL (ref 4.22–5.81)
RDW: 13.5 % (ref 11.5–15.5)
WBC: 3.4 10*3/uL — ABNORMAL LOW (ref 4.0–10.5)

## 2012-03-26 LAB — COMPLETE METABOLIC PANEL WITH GFR
ALT: 49 U/L (ref 0–53)
AST: 67 U/L — ABNORMAL HIGH (ref 0–37)
Albumin: 4.1 g/dL (ref 3.5–5.2)
Alkaline Phosphatase: 66 U/L (ref 39–117)
BUN: 10 mg/dL (ref 6–23)
CO2: 24 mEq/L (ref 19–32)
Calcium: 8.9 mg/dL (ref 8.4–10.5)
Chloride: 105 mEq/L (ref 96–112)
Creat: 0.98 mg/dL (ref 0.50–1.35)
GFR, Est African American: >89 mL/min
GFR, Est Non African American: 85 mL/min
Glucose, Bld: 116 mg/dL — ABNORMAL HIGH (ref 70–99)
Potassium: 3.9 mEq/L (ref 3.5–5.3)
Sodium: 138 mEq/L (ref 135–145)
Total Bilirubin: 0.5 mg/dL (ref 0.3–1.2)
Total Protein: 7.4 g/dL (ref 6.0–8.3)

## 2012-03-27 LAB — T-HELPER CELL (CD4) - (RCID CLINIC ONLY)
CD4 % Helper T Cell: 17 % — ABNORMAL LOW (ref 33–55)
CD4 T Cell Abs: 120 uL — ABNORMAL LOW (ref 400–2700)

## 2012-03-27 LAB — RPR

## 2012-03-28 LAB — HIV-1 RNA QUANT-NO REFLEX-BLD
HIV 1 RNA Quant: <20 copies/mL (ref <20)
HIV-1 RNA Quant, Log: <1.3 {Log} (ref <1.30)

## 2012-04-17 ENCOUNTER — Ambulatory Visit: Payer: Medicaid Other | Admitting: Infectious Disease

## 2012-04-29 ENCOUNTER — Ambulatory Visit (INDEPENDENT_AMBULATORY_CARE_PROVIDER_SITE_OTHER): Payer: Medicaid Other | Admitting: Infectious Disease

## 2012-04-29 ENCOUNTER — Encounter: Payer: Self-pay | Admitting: *Deleted

## 2012-04-29 ENCOUNTER — Encounter: Payer: Self-pay | Admitting: Infectious Disease

## 2012-04-29 ENCOUNTER — Telehealth: Payer: Self-pay | Admitting: *Deleted

## 2012-04-29 VITALS — BP 143/90 | HR 74 | Temp 98.2°F | Wt 147.0 lb

## 2012-04-29 DIAGNOSIS — R590 Localized enlarged lymph nodes: Secondary | ICD-10-CM

## 2012-04-29 DIAGNOSIS — B20 Human immunodeficiency virus [HIV] disease: Secondary | ICD-10-CM

## 2012-04-29 DIAGNOSIS — R21 Rash and other nonspecific skin eruption: Secondary | ICD-10-CM | POA: Insufficient documentation

## 2012-04-29 DIAGNOSIS — E291 Testicular hypofunction: Secondary | ICD-10-CM

## 2012-04-29 DIAGNOSIS — B459 Cryptococcosis, unspecified: Secondary | ICD-10-CM

## 2012-04-29 DIAGNOSIS — B1710 Acute hepatitis C without hepatic coma: Secondary | ICD-10-CM

## 2012-04-29 DIAGNOSIS — R599 Enlarged lymph nodes, unspecified: Secondary | ICD-10-CM

## 2012-04-29 DIAGNOSIS — C210 Malignant neoplasm of anus, unspecified: Secondary | ICD-10-CM

## 2012-04-29 DIAGNOSIS — Z23 Encounter for immunization: Secondary | ICD-10-CM

## 2012-04-29 MED ORDER — CLOTRIMAZOLE 1 % EX CREA: TOPICAL_CREAM | Freq: Two times a day (BID) | CUTANEOUS | Status: DC

## 2012-04-29 NOTE — Assessment & Plan Note (Signed)
Perfect control 

## 2012-04-29 NOTE — Assessment & Plan Note (Signed)
Continue fluconazole prophylaxis

## 2012-04-29 NOTE — Telephone Encounter (Signed)
I tried to fax a refill on [facility]ax to [facility],failed 3 x,s so called [phone],answering machine stated , "this number has been temporarily disconnected, no further information is available  at this time,informed Dr.Murray, to try and call patient and let him know rx will be refilled this last time per MD   Attempted to call patient at this home number , male answered, and said no one her by that name," apologized,  Will inform MD 9:53 AM

## 2012-04-29 NOTE — Assessment & Plan Note (Signed)
Check CT neck with contrast

## 2012-04-29 NOTE — Assessment & Plan Note (Signed)
Try otc lotrim

## 2012-04-29 NOTE — Assessment & Plan Note (Signed)
Check viral load and genotype 

## 2012-04-29 NOTE — Progress Notes (Signed)
Subjective:    Patient ID: [name], male    DOB: 04/22/1956, 56 y.o.   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 He has been followed by Dr. Dayton Scrape from Radiation Oncology who believes he may be disease free, but Dr. Dayton Scrape still would like pt seen at minimum by by General Surgery for exam of rectum and biopsy. We are trying to arrange for this.  He also notes worsening lymph node in right paracervical chain, tender has been here for two months. He denies fevers or worsening weight loss. 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   He does have rash on bilateral arms that is worse. Will try lotrimin for this. Cell phone # [phone]  I spent greater than 45 minutes with the patient including greater than 50% of time in face to face counsel of the patient and in coordination of their care.   Review of Systems  Constitutional: Negative for fever, chills, diaphoresis, activity change, appetite change, fatigue and unexpected weight change.  HENT: Negative for congestion, sore throat, rhinorrhea, sneezing, trouble swallowing and sinus pressure.   Eyes: Negative for photophobia and visual disturbance.  Respiratory: Negative for cough, chest tightness, shortness of breath, wheezing and stridor.   Cardiovascular: Negative for chest pain, palpitations and leg swelling.  Gastrointestinal: Negative for nausea, vomiting, abdominal pain, diarrhea, constipation, blood in stool, abdominal distention and anal bleeding.  Genitourinary: Negative for dysuria, hematuria, flank pain and difficulty urinating.  Musculoskeletal: Negative for myalgias, back pain, joint swelling, arthralgias and gait problem.  Skin: Negative for color change, pallor, rash and wound.  Neurological: Negative for dizziness, tremors,  weakness and light-headedness.  Hematological: Negative for adenopathy. Does not bruise/bleed easily.  Psychiatric/Behavioral: Negative for behavioral problems, confusion, disturbed wake/sleep cycle, dysphoric mood, decreased concentration and agitation.       Objective:   Physical Exam  Constitutional: He is oriented to person, place, and time. He appears cachectic. No distress.  HENT:  Head: Normocephalic and atraumatic.  Mouth/Throat: No oropharyngeal exudate.  Eyes: Conjunctivae normal and EOM are normal. Pupils are equal, round, and reactive to light. No scleral icterus.  Neck: Normal range of motion. Neck supple. No JVD present.  Cardiovascular: Normal rate, regular rhythm and normal heart sounds.  Exam reveals no gallop and no friction rub.   No murmur heard. Pulmonary/Chest: Effort normal and breath sounds normal. No respiratory distress. He has no wheezes. He has no rales. He exhibits no tenderness.  Abdominal: He exhibits no distension and no mass. There is no tenderness. There is no rebound and no guarding.  Musculoskeletal: He exhibits no edema and no tenderness.  Lymphadenopathy:    He has cervical adenopathy.       Right cervical: Superficial cervical and deep cervical adenopathy present.       Left cervical: Superficial cervical adenopathy present.  Neurological: He is alert and oriented to person, place, and time. He has normal reflexes. He exhibits normal muscle tone. Coordination normal.  Skin: Skin is warm and dry. He is not diaphoretic. No erythema. No pallor.  Psychiatric: He has a normal mood and affect. His behavior is normal. Judgment and thought content normal.          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 Dayton Scrape. Will refer to  [facility].  TESTICULAR HYPOFUNCTION Refill testosterone  Rash Try otc lotrim

## 2012-04-29 NOTE — Assessment & Plan Note (Signed)
Refill testosterone

## 2012-04-29 NOTE — Assessment & Plan Note (Signed)
Following with Dr. Dayton Scrape. Will refer to [facility].

## 2012-04-29 NOTE — Progress Notes (Signed)
Refill on tamsulosin 0.4mg   Okay per Dr.Murray, faxed refill request to [facility] to fax#[phone] 1  Daily, no refills dispense#30

## 2012-05-01 ENCOUNTER — Other Ambulatory Visit: Payer: Self-pay | Admitting: Licensed Clinical Social Worker

## 2012-05-01 DIAGNOSIS — R599 Enlarged lymph nodes, unspecified: Secondary | ICD-10-CM

## 2012-05-02 ENCOUNTER — Encounter (INDEPENDENT_AMBULATORY_CARE_PROVIDER_SITE_OTHER): Payer: Self-pay | Admitting: General Surgery

## 2012-05-02 ENCOUNTER — Other Ambulatory Visit (HOSPITAL_COMMUNITY): Payer: Medicaid Other

## 2012-05-02 ENCOUNTER — Ambulatory Visit (INDEPENDENT_AMBULATORY_CARE_PROVIDER_SITE_OTHER): Payer: Medicaid Other | Admitting: General Surgery

## 2012-05-02 VITALS — BP 133/84 | HR 88 | Temp 97.6°F | Resp 18 | Ht 67.0 in | Wt 143.0 lb

## 2012-05-02 DIAGNOSIS — C211 Malignant neoplasm of anal canal: Secondary | ICD-10-CM

## 2012-05-02 NOTE — Progress Notes (Signed)
Subjective:     Patient ID: KHOLE BRANCH, male   DOB: 07-20-1956, 56 y.o.   MRN: [id number]  HPI The patient states Gen. Male who is referred by Dr. Daiva Eves the patient has been seen by Dr. 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 Patient received postoperative radiation. Patient states this time he's had diarrhea and gas at this time. Patient is HIV medications.  Patient otherwise denies any weight loss fevers chills  Review of Systems  Constitutional: Negative.   HENT: Negative.   Eyes: Negative.   Respiratory: Negative.   Cardiovascular: Negative.   Gastrointestinal: Negative.   Musculoskeletal: Negative.   Neurological: Negative.        Objective:   Physical Exam  Constitutional: He is oriented to person, place, and time. He appears well-developed and well-nourished.  HENT:  Head: Normocephalic and atraumatic.  Eyes: Pupils are equal, round, and reactive to light.  Neck: Normal range of motion. Neck supple.  Cardiovascular: Normal rate and regular rhythm.   Pulmonary/Chest: Effort normal and breath sounds normal.  Abdominal: Soft. Bowel sounds are normal.  Genitourinary:       Small scar seen in the anal canal. Small internal hemorrhoid.  Musculoskeletal: Normal range of motion.  Neurological: He is alert and oriented to person, place, and time.       Assessment:     Patient is a 56 year old male status post anal canal squamous cell carcinoma removal.    Plan:     At this time patient will be scheduled for followup 6 months for recheck his ear canal. Advised patient to supplement his diet with by mouth fiber to help with diarrhea at this time.

## 2012-06-12 ENCOUNTER — Other Ambulatory Visit: Payer: Self-pay

## 2012-06-12 DIAGNOSIS — IMO0001 Reserved for inherently not codable concepts without codable children: Secondary | ICD-10-CM

## 2012-06-12 DIAGNOSIS — B20 Human immunodeficiency virus [HIV] disease: Secondary | ICD-10-CM

## 2012-06-12 DIAGNOSIS — R197 Diarrhea, unspecified: Secondary | ICD-10-CM

## 2012-06-12 DIAGNOSIS — K645 Perianal venous thrombosis: Secondary | ICD-10-CM

## 2012-06-12 DIAGNOSIS — E291 Testicular hypofunction: Secondary | ICD-10-CM

## 2012-06-12 DIAGNOSIS — Z113 Encounter for screening for infections with a predominantly sexual mode of transmission: Secondary | ICD-10-CM

## 2012-06-12 DIAGNOSIS — I10 Essential (primary) hypertension: Secondary | ICD-10-CM

## 2012-06-12 DIAGNOSIS — R21 Rash and other nonspecific skin eruption: Secondary | ICD-10-CM

## 2012-06-12 DIAGNOSIS — C210 Malignant neoplasm of anus, unspecified: Secondary | ICD-10-CM

## 2012-06-12 MED ORDER — DIPHENOXYLATE-ATROPINE 2.5-0.025 MG PO TABS: 1.0000 | ORAL_TABLET | Freq: Four times a day (QID) | ORAL | Status: DC | PRN

## 2012-06-12 MED ORDER — FLUCONAZOLE 200 MG PO TABS: 200.0000 mg | ORAL_TABLET | Freq: Every day | ORAL | Status: DC

## 2012-06-12 MED ORDER — EMTRICITABINE-TENOFOVIR DF 200-300 MG PO TABS: 1.0000 | ORAL_TABLET | Freq: Every day | ORAL | Status: DC

## 2012-06-12 MED ORDER — DARUNAVIR ETHANOLATE 800 MG PO TABS: 800.0000 mg | ORAL_TABLET | Freq: Every day | ORAL | Status: DC

## 2012-06-12 MED ORDER — RITONAVIR 100 MG PO CAPS: 100.0000 mg | ORAL_CAPSULE | Freq: Every day | ORAL | Status: DC

## 2012-06-12 MED ORDER — SULFAMETHOXAZOLE-TMP DS 800-160 MG PO TABS: 1.0000 | ORAL_TABLET | Freq: Two times a day (BID) | ORAL | Status: DC

## 2012-06-12 MED ORDER — DILTIAZEM HCL ER 60 MG PO CP12: 60.0000 mg | ORAL_CAPSULE | Freq: Once | ORAL | Status: DC

## 2012-08-08 ENCOUNTER — Other Ambulatory Visit: Payer: Self-pay | Admitting: Infectious Disease

## 2012-08-19 ENCOUNTER — Other Ambulatory Visit: Payer: Medicaid Other

## 2012-09-04 ENCOUNTER — Ambulatory Visit: Payer: Medicaid Other | Admitting: Infectious Disease

## 2012-09-04 ENCOUNTER — Other Ambulatory Visit: Payer: Self-pay | Admitting: *Deleted

## 2012-09-04 ENCOUNTER — Other Ambulatory Visit: Payer: Self-pay | Admitting: Infectious Disease

## 2012-09-04 DIAGNOSIS — B20 Human immunodeficiency virus [HIV] disease: Secondary | ICD-10-CM

## 2012-09-04 MED ORDER — DIPHENOXYLATE-ATROPINE 2.5-0.025 MG PO TABS: ORAL_TABLET | ORAL | Status: DC

## 2012-09-10 ENCOUNTER — Encounter: Payer: Self-pay | Admitting: Infectious Diseases

## 2012-09-18 ENCOUNTER — Encounter (INDEPENDENT_AMBULATORY_CARE_PROVIDER_SITE_OTHER): Payer: Medicaid Other | Admitting: General Surgery

## 2012-09-25 ENCOUNTER — Ambulatory Visit: Payer: Self-pay | Admitting: Infectious Disease

## 2012-09-30 ENCOUNTER — Other Ambulatory Visit: Payer: Self-pay | Admitting: Infectious Disease

## 2012-09-30 ENCOUNTER — Telehealth: Payer: Self-pay | Admitting: *Deleted

## 2012-09-30 ENCOUNTER — Other Ambulatory Visit (INDEPENDENT_AMBULATORY_CARE_PROVIDER_SITE_OTHER): Payer: Medicaid Other

## 2012-09-30 DIAGNOSIS — B20 Human immunodeficiency virus [HIV] disease: Secondary | ICD-10-CM

## 2012-09-30 LAB — COMPLETE METABOLIC PANEL WITH GFR
ALT: 55 U/L — ABNORMAL HIGH (ref 0–53)
AST: 59 U/L — ABNORMAL HIGH (ref 0–37)
Albumin: 4.5 g/dL (ref 3.5–5.2)
Alkaline Phosphatase: 65 U/L (ref 39–117)
BUN: 16 mg/dL (ref 6–23)
CO2: 23 mEq/L (ref 19–32)
Calcium: 9.6 mg/dL (ref 8.4–10.5)
Chloride: 104 mEq/L (ref 96–112)
Creat: 1.26 mg/dL (ref 0.50–1.35)
GFR, Est African American: 73 mL/min
GFR, Est Non African American: 63 mL/min
Glucose, Bld: 99 mg/dL (ref 70–99)
Potassium: 4 mEq/L (ref 3.5–5.3)
Sodium: 138 mEq/L (ref 135–145)
Total Bilirubin: 0.5 mg/dL (ref 0.3–1.2)
Total Protein: 8 g/dL (ref 6.0–8.3)

## 2012-09-30 LAB — RPR

## 2012-09-30 LAB — CBC WITH DIFFERENTIAL/PLATELET
Basophils Absolute: 0 10*3/uL (ref 0.0–0.1)
Basophils Relative: 1 % (ref 0–1)
Eosinophils Absolute: 0.2 10*3/uL (ref 0.0–0.7)
Eosinophils Relative: 4 % (ref 0–5)
HCT: 38.9 % — ABNORMAL LOW (ref 39.0–52.0)
Hemoglobin: 13.5 g/dL (ref 13.0–17.0)
Lymphocytes Relative: 22 % (ref 12–46)
Lymphs Abs: 0.9 10*3/uL (ref 0.7–4.0)
MCH: 29.5 pg (ref 26.0–34.0)
MCHC: 34.7 g/dL (ref 30.0–36.0)
MCV: 84.9 fL (ref 78.0–100.0)
Monocytes Absolute: 0.5 10*3/uL (ref 0.1–1.0)
Monocytes Relative: 12 % (ref 3–12)
Neutro Abs: 2.6 10*3/uL (ref 1.7–7.7)
Neutrophils Relative %: 61 % (ref 43–77)
Platelets: 283 10*3/uL (ref 150–400)
RBC: 4.58 MIL/uL (ref 4.22–5.81)
RDW: 14.4 % (ref 11.5–15.5)
WBC: 4.2 10*3/uL (ref 4.0–10.5)

## 2012-09-30 LAB — LIPID PANEL
Cholesterol: 113 mg/dL (ref 0–200)
HDL: 47 mg/dL (ref >39)
LDL Cholesterol: 27 mg/dL (ref 0–99)
Total CHOL/HDL Ratio: 2.4 Ratio
Triglycerides: 197 mg/dL — ABNORMAL HIGH (ref <150)
VLDL: 39 mg/dL (ref 0–40)

## 2012-09-30 NOTE — Telephone Encounter (Signed)
Patient walked in and asked to be seen about a rash on his arms. Advised him we have an appt available but that he would have to wait until the doctor saw the 3 patients ahead of him to be seen. The patient advised he just wanted to have his arm looked at not wait around here. Advised him there are patients already waiting but the if he waits we will be glad to look at his rash. He advised he will wait until his visit in 2 weeks to be seen he does not have time today.

## 2012-10-01 LAB — HIV-1 RNA QUANT-NO REFLEX-BLD
HIV 1 RNA Quant: 37 copies/mL — ABNORMAL HIGH (ref <20)
HIV-1 RNA Quant, Log: 1.57 {Log} — ABNORMAL HIGH (ref <1.30)

## 2012-10-02 ENCOUNTER — Other Ambulatory Visit: Payer: Self-pay | Admitting: Infectious Disease

## 2012-10-02 LAB — T-HELPER CELL (CD4) - (RCID CLINIC ONLY)
CD4 % Helper T Cell: 18 % — ABNORMAL LOW (ref 33–55)
CD4 T Cell Abs: 160 uL — ABNORMAL LOW (ref 400–2700)

## 2012-10-03 ENCOUNTER — Encounter (INDEPENDENT_AMBULATORY_CARE_PROVIDER_SITE_OTHER): Payer: Medicaid Other | Admitting: General Surgery

## 2012-10-16 ENCOUNTER — Ambulatory Visit: Payer: Medicaid Other | Admitting: Infectious Disease

## 2012-10-24 ENCOUNTER — Telehealth: Payer: Self-pay | Admitting: *Deleted

## 2012-10-24 ENCOUNTER — Ambulatory Visit (INDEPENDENT_AMBULATORY_CARE_PROVIDER_SITE_OTHER): Payer: Medicaid Other | Admitting: Infectious Disease

## 2012-10-24 ENCOUNTER — Encounter: Payer: Self-pay | Admitting: Infectious Disease

## 2012-10-24 VITALS — BP 126/86 | HR 76 | Temp 98.5°F | Wt 160.0 lb

## 2012-10-24 DIAGNOSIS — B20 Human immunodeficiency virus [HIV] disease: Secondary | ICD-10-CM

## 2012-10-24 DIAGNOSIS — B451 Cerebral cryptococcosis: Secondary | ICD-10-CM

## 2012-10-24 DIAGNOSIS — E291 Testicular hypofunction: Secondary | ICD-10-CM

## 2012-10-24 DIAGNOSIS — B459 Cryptococcosis, unspecified: Secondary | ICD-10-CM

## 2012-10-24 DIAGNOSIS — R7989 Other specified abnormal findings of blood chemistry: Secondary | ICD-10-CM

## 2012-10-24 DIAGNOSIS — C20 Malignant neoplasm of rectum: Secondary | ICD-10-CM

## 2012-10-24 DIAGNOSIS — R21 Rash and other nonspecific skin eruption: Secondary | ICD-10-CM

## 2012-10-24 MED ORDER — TESTOSTERONE 2.5 MG/24HR TD PT24: 1.0000 | MEDICATED_PATCH | Freq: Every day | TRANSDERMAL | Status: DC

## 2012-10-24 MED ORDER — DIPHENOXYLATE-ATROPINE 2.5-0.025 MG PO TABS: ORAL_TABLET | ORAL | Status: DC

## 2012-10-24 MED ORDER — TRIAMCINOLONE ACETONIDE 0.025 % EX OINT: TOPICAL_OINTMENT | Freq: Two times a day (BID) | CUTANEOUS | Status: DC

## 2012-10-24 MED ORDER — SULFAMETHOXAZOLE-TMP DS 800-160 MG PO TABS: 1.0000 | ORAL_TABLET | Freq: Once | ORAL | Status: DC

## 2012-10-24 NOTE — Progress Notes (Signed)
Subjective:    Patient ID: [name], male    DOB: 1956-07-13, 57 y.o.   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  He does have rash on bilateral arms that is worse. He apparently had had improvement in the past with topical steroids but not with topical antifungals I'm refilling triamcinolone for him.  He has not been taking his testosterone so we are going to replace that as well now.  I spent greater than 45 minutes with the patient including greater than 50% of time in face to face counsel of the patient and in coordination of their care.   Review of Systems  Constitutional: Negative for fever, chills, diaphoresis, activity change, appetite change, fatigue and unexpected weight change.  HENT: Negative for congestion, sore throat, rhinorrhea, sneezing, trouble swallowing and sinus pressure.   Eyes: Negative for photophobia and visual disturbance.  Respiratory: Negative for cough, chest tightness, shortness of breath, wheezing and stridor.   Cardiovascular: Negative for chest pain, palpitations and leg swelling.  Gastrointestinal: Negative for nausea, vomiting, abdominal pain, diarrhea, constipation, blood in stool, abdominal distention and anal bleeding.  Genitourinary: Negative for dysuria, hematuria, flank pain and difficulty urinating.  Musculoskeletal: Negative for myalgias, back pain, joint swelling, arthralgias and gait problem.  Skin: Positive for rash. Negative for color change, pallor and wound.  Neurological: Negative for dizziness, tremors, weakness and light-headedness.  Hematological: Negative for adenopathy. Does not bruise/bleed easily.  Psychiatric/Behavioral: Negative for behavioral problems, confusion, sleep disturbance, dysphoric mood, decreased  concentration and agitation.       Objective:   Physical Exam  Constitutional: He is oriented to person, place, and time. He appears cachectic. No distress.  HENT:  Head: Normocephalic and atraumatic.  Mouth/Throat: No oropharyngeal exudate.  Eyes: Conjunctivae and EOM are normal. Pupils are equal, round, and reactive to light. No scleral icterus.  Neck: Normal range of motion. Neck supple. No JVD present.  Cardiovascular: Normal rate, regular rhythm and normal heart sounds.  Exam reveals no gallop and no friction rub.   No murmur heard. Pulmonary/Chest: Effort normal and breath sounds normal. No respiratory distress. He has no wheezes. He has no rales. He exhibits no tenderness.  Abdominal: He exhibits no distension and no mass. There is no tenderness. There is no rebound and no guarding.  Musculoskeletal: He exhibits no edema and no tenderness.  Lymphadenopathy:    He has cervical adenopathy.       Right cervical: Superficial cervical and deep cervical adenopathy present.       Left cervical: Superficial cervical adenopathy present.  Neurological: He is alert and oriented to person, place, and time. He has normal reflexes. Coordination normal.  Skin: Skin is warm and dry. Rash noted. He is not diaphoretic. No erythema. No pallor.     Psychiatric: He has a normal mood and affect. His behavior is normal. Judgment and thought content normal.          Assessment & Plan:   HIV: Essentially perfect control with Prezista Norvir and Truvada. Bring back in 4 months for repeat labs.  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  Rash: We'll try topical triamcinolone again.  Squamous cell carcinoma status post resection and XRT: Being followed by general surgery.  Low testosterone Will replace this again.

## 2012-10-24 NOTE — Telephone Encounter (Signed)
Androderm no longer comes in a patch. Needs either gel or injection.  Dr Daiva Eves please prescribe a different delivery system, strength, frequency, and amount, number of refills.

## 2012-10-24 NOTE — Telephone Encounter (Signed)
Called in the patient Androderm Rx with 4 refills.

## 2012-11-04 ENCOUNTER — Other Ambulatory Visit: Payer: Self-pay | Admitting: Infectious Disease

## 2012-11-05 ENCOUNTER — Other Ambulatory Visit: Payer: Self-pay | Admitting: Licensed Clinical Social Worker

## 2012-11-05 DIAGNOSIS — R000 Tachycardia, unspecified: Secondary | ICD-10-CM

## 2012-11-05 MED ORDER — DILTIAZEM HCL ER 60 MG PO CP12: 60.0000 mg | ORAL_CAPSULE | Freq: Two times a day (BID) | ORAL | Status: DC

## 2012-11-05 NOTE — Telephone Encounter (Signed)
Angelique Blonder can you find out which [place] would prefer and check with Rogelia Boga what appropriate dose should be?

## 2013-01-01 ENCOUNTER — Other Ambulatory Visit: Payer: Self-pay | Admitting: Infectious Disease

## 2013-01-15 ENCOUNTER — Other Ambulatory Visit: Payer: Self-pay | Admitting: Infectious Disease

## 2013-01-15 DIAGNOSIS — B20 Human immunodeficiency virus [HIV] disease: Secondary | ICD-10-CM

## 2013-01-20 ENCOUNTER — Other Ambulatory Visit: Payer: Self-pay | Admitting: Licensed Clinical Social Worker

## 2013-02-13 ENCOUNTER — Other Ambulatory Visit: Payer: Medicaid Other

## 2013-02-27 ENCOUNTER — Ambulatory Visit: Payer: Medicaid Other | Admitting: Infectious Disease

## 2013-03-19 ENCOUNTER — Other Ambulatory Visit: Payer: Self-pay | Admitting: Licensed Clinical Social Worker

## 2013-03-19 DIAGNOSIS — B20 Human immunodeficiency virus [HIV] disease: Secondary | ICD-10-CM

## 2013-03-19 MED ORDER — DARUNAVIR ETHANOLATE 800 MG PO TABS: ORAL_TABLET | ORAL | Status: DC

## 2013-03-19 MED ORDER — RITONAVIR 100 MG PO CAPS: ORAL_CAPSULE | ORAL | Status: DC

## 2013-03-19 MED ORDER — DIPHENOXYLATE-ATROPINE 2.5-0.025 MG PO TABS: ORAL_TABLET | ORAL | Status: DC

## 2013-03-19 MED ORDER — EMTRICITABINE-TENOFOVIR DF 200-300 MG PO TABS: ORAL_TABLET | ORAL | Status: DC

## 2013-03-19 MED ORDER — SULFAMETHOXAZOLE-TMP DS 800-160 MG PO TABS: 1.0000 | ORAL_TABLET | Freq: Once | ORAL | Status: DC

## 2013-04-23 ENCOUNTER — Other Ambulatory Visit: Payer: Self-pay | Admitting: Infectious Disease

## 2013-04-23 ENCOUNTER — Other Ambulatory Visit: Payer: Medicaid Other

## 2013-04-23 ENCOUNTER — Ambulatory Visit (INDEPENDENT_AMBULATORY_CARE_PROVIDER_SITE_OTHER): Payer: Medicaid Other

## 2013-04-23 DIAGNOSIS — B20 Human immunodeficiency virus [HIV] disease: Secondary | ICD-10-CM

## 2013-04-23 DIAGNOSIS — Z23 Encounter for immunization: Secondary | ICD-10-CM

## 2013-04-23 LAB — CBC WITH DIFFERENTIAL/PLATELET
Basophils Absolute: 0 10*3/uL (ref 0.0–0.1)
Basophils Relative: 1 % (ref 0–1)
Eosinophils Absolute: 0.2 10*3/uL (ref 0.0–0.7)
Eosinophils Relative: 5 % (ref 0–5)
HCT: 38.2 % — ABNORMAL LOW (ref 39.0–52.0)
Hemoglobin: 13.3 g/dL (ref 13.0–17.0)
Lymphocytes Relative: 23 % (ref 12–46)
Lymphs Abs: 1 10*3/uL (ref 0.7–4.0)
MCH: 30.4 pg (ref 26.0–34.0)
MCHC: 34.8 g/dL (ref 30.0–36.0)
MCV: 87.2 fL (ref 78.0–100.0)
Monocytes Absolute: 0.7 10*3/uL (ref 0.1–1.0)
Monocytes Relative: 16 % — ABNORMAL HIGH (ref 3–12)
Neutro Abs: 2.5 10*3/uL (ref 1.7–7.7)
Neutrophils Relative %: 55 % (ref 43–77)
Platelets: 290 10*3/uL (ref 150–400)
RBC: 4.38 MIL/uL (ref 4.22–5.81)
RDW: 13.7 % (ref 11.5–15.5)
WBC: 4.4 10*3/uL (ref 4.0–10.5)

## 2013-04-23 LAB — COMPLETE METABOLIC PANEL WITH GFR
ALT: 39 U/L (ref 0–53)
AST: 38 U/L — ABNORMAL HIGH (ref 0–37)
Albumin: 4.3 g/dL (ref 3.5–5.2)
Alkaline Phosphatase: 61 U/L (ref 39–117)
BUN: 16 mg/dL (ref 6–23)
CO2: 25 mEq/L (ref 19–32)
Calcium: 9.3 mg/dL (ref 8.4–10.5)
Chloride: 104 mEq/L (ref 96–112)
Creat: 1.21 mg/dL (ref 0.50–1.35)
GFR, Est African American: 76 mL/min
GFR, Est Non African American: 66 mL/min
Glucose, Bld: 96 mg/dL (ref 70–99)
Potassium: 4.2 mEq/L (ref 3.5–5.3)
Sodium: 137 mEq/L (ref 135–145)
Total Bilirubin: 0.5 mg/dL (ref 0.3–1.2)
Total Protein: 7.8 g/dL (ref 6.0–8.3)

## 2013-04-23 LAB — LIPID PANEL
Cholesterol: 111 mg/dL (ref 0–200)
HDL: 44 mg/dL (ref >39)
LDL Cholesterol: 36 mg/dL (ref 0–99)
Total CHOL/HDL Ratio: 2.5 Ratio
Triglycerides: 155 mg/dL — ABNORMAL HIGH (ref <150)
VLDL: 31 mg/dL (ref 0–40)

## 2013-04-23 LAB — RPR

## 2013-04-24 LAB — HIV-1 RNA QUANT-NO REFLEX-BLD
HIV 1 RNA Quant: <20 copies/mL (ref <20)
HIV-1 RNA Quant, Log: <1.3 {Log} (ref <1.30)

## 2013-04-24 LAB — T-HELPER CELL (CD4) - (RCID CLINIC ONLY)
CD4 % Helper T Cell: 21 % — ABNORMAL LOW (ref 33–55)
CD4 T Cell Abs: 240 /uL — ABNORMAL LOW (ref 400–2700)

## 2013-05-07 ENCOUNTER — Ambulatory Visit (INDEPENDENT_AMBULATORY_CARE_PROVIDER_SITE_OTHER): Payer: Medicaid Other | Admitting: Infectious Disease

## 2013-05-07 ENCOUNTER — Encounter: Payer: Self-pay | Admitting: Infectious Disease

## 2013-05-07 VITALS — BP 137/87 | HR 81 | Temp 97.9°F | Wt 152.0 lb

## 2013-05-07 DIAGNOSIS — B451 Cerebral cryptococcosis: Secondary | ICD-10-CM

## 2013-05-07 DIAGNOSIS — Z23 Encounter for immunization: Secondary | ICD-10-CM

## 2013-05-07 DIAGNOSIS — B20 Human immunodeficiency virus [HIV] disease: Secondary | ICD-10-CM

## 2013-05-07 DIAGNOSIS — C20 Malignant neoplasm of rectum: Secondary | ICD-10-CM

## 2013-05-07 DIAGNOSIS — R7989 Other specified abnormal findings of blood chemistry: Secondary | ICD-10-CM

## 2013-05-07 DIAGNOSIS — B459 Cryptococcosis, unspecified: Secondary | ICD-10-CM

## 2013-05-07 DIAGNOSIS — E291 Testicular hypofunction: Secondary | ICD-10-CM

## 2013-05-07 NOTE — Progress Notes (Signed)
  Subjective:    Patient ID: [name], male    DOB: 15-Aug-1955, 57 y.o.   MRN: [id number]  HPI  57 year old [place] American male with HIV/AIDS  Perfect virological  suppression with most recent VL  Undetectable  and  cd4 healthy.   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 Does not appear to have seen Surgery since 04/2012   He has not been taking his testosterone again.     Review of Systems  Constitutional: Negative for fever, chills, diaphoresis, activity change, appetite change, fatigue and unexpected weight change.  HENT: Negative for congestion, sore throat, rhinorrhea, sneezing, trouble swallowing and sinus pressure.   Eyes: Negative for photophobia and visual disturbance.  Respiratory: Negative for cough, chest tightness, shortness of breath, wheezing and stridor.   Cardiovascular: Negative for chest pain, palpitations and leg swelling.  Gastrointestinal: Negative for nausea, vomiting, abdominal pain, diarrhea, constipation, blood in stool, abdominal distention and anal bleeding.  Genitourinary: Negative for dysuria, hematuria, flank pain and difficulty urinating.  Musculoskeletal: Negative for myalgias, back pain, joint swelling, arthralgias and gait problem.  Skin: Positive for rash. Negative for color change, pallor and wound.  Neurological: Negative for dizziness, tremors, weakness and light-headedness.  Hematological: Negative for adenopathy. Does not bruise/bleed easily.  Psychiatric/Behavioral: Negative for behavioral problems, confusion, sleep disturbance, dysphoric mood, decreased concentration and agitation.       Objective:   Physical Exam  Constitutional: He is oriented to person, place, and time. He appears cachectic. No distress.  HENT:  Head: Normocephalic and atraumatic.  Mouth/Throat: No oropharyngeal exudate.  Eyes: Conjunctivae and EOM are normal. Pupils are equal, round, and reactive to light.  No scleral icterus.  Neck: Normal range of motion. Neck supple. No JVD present.  Cardiovascular: Normal rate, regular rhythm and normal heart sounds.  Exam reveals no gallop and no friction rub.   No murmur heard. Pulmonary/Chest: Effort normal and breath sounds normal. No respiratory distress. He has no wheezes. He has no rales. He exhibits no tenderness.  Abdominal: He exhibits no distension and no mass. There is no tenderness. There is no rebound and no guarding.  Musculoskeletal: He exhibits no edema and no tenderness.  Lymphadenopathy:    He has cervical adenopathy.       Right cervical: Superficial cervical and deep cervical adenopathy present.       Left cervical: Superficial cervical adenopathy present.  Neurological: He is alert and oriented to person, place, and time. He has normal reflexes. Coordination normal.  Skin: Skin is warm and dry. No rash noted. He is not diaphoretic. No erythema. No pallor.  Psychiatric: He has a normal mood and affect. His behavior is normal. Judgment and thought content normal.          Assessment & Plan:   HIV: perfect control with Prezista Norvir and Truvada. Bring back in 2 months for repeat labs.  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  Squamous cell carcinoma status post resection and XRT: Needs followup with  general surgery.  Low testosterone Will try to remedy this at next appt  HCM: flu shot given

## 2013-07-07 ENCOUNTER — Ambulatory Visit: Payer: Medicaid Other | Admitting: Infectious Disease

## 2013-07-08 ENCOUNTER — Telehealth: Payer: Self-pay | Admitting: Licensed Clinical Social Worker

## 2013-07-08 NOTE — Telephone Encounter (Signed)
Patient missed appointment yesterday, I called but the patient did not answer, vm was not available.

## 2013-07-11 ENCOUNTER — Other Ambulatory Visit: Payer: Self-pay | Admitting: Infectious Disease

## 2013-07-16 ENCOUNTER — Other Ambulatory Visit: Payer: Self-pay | Admitting: Infectious Disease

## 2013-07-17 ENCOUNTER — Other Ambulatory Visit: Payer: Medicaid Other

## 2013-07-17 ENCOUNTER — Encounter: Payer: Medicaid Other | Admitting: Infectious Disease

## 2013-07-17 DIAGNOSIS — B20 Human immunodeficiency virus [HIV] disease: Secondary | ICD-10-CM

## 2013-07-17 LAB — CBC WITH DIFFERENTIAL/PLATELET
Basophils Absolute: 0 10*3/uL (ref 0.0–0.1)
Basophils Relative: 1 % (ref 0–1)
Eosinophils Absolute: 0.1 10*3/uL (ref 0.0–0.7)
Eosinophils Relative: 2 % (ref 0–5)
HCT: 40.6 % (ref 39.0–52.0)
Hemoglobin: 13.8 g/dL (ref 13.0–17.0)
Lymphocytes Relative: 26 % (ref 12–46)
Lymphs Abs: 0.9 10*3/uL (ref 0.7–4.0)
MCH: 30.6 pg (ref 26.0–34.0)
MCHC: 34 g/dL (ref 30.0–36.0)
MCV: 90 fL (ref 78.0–100.0)
Monocytes Absolute: 0.6 10*3/uL (ref 0.1–1.0)
Monocytes Relative: 18 % — ABNORMAL HIGH (ref 3–12)
Neutro Abs: 1.8 10*3/uL (ref 1.7–7.7)
Neutrophils Relative %: 53 % (ref 43–77)
Platelets: 259 10*3/uL (ref 150–400)
RBC: 4.51 MIL/uL (ref 4.22–5.81)
RDW: 13.2 % (ref 11.5–15.5)
WBC: 3.4 10*3/uL — ABNORMAL LOW (ref 4.0–10.5)

## 2013-07-17 LAB — TESTOSTERONE: Testosterone: 457 ng/dL (ref 300–890)

## 2013-07-17 LAB — COMPLETE METABOLIC PANEL WITH GFR
ALT: 56 U/L — ABNORMAL HIGH (ref 0–53)
AST: 65 U/L — ABNORMAL HIGH (ref 0–37)
Albumin: 4.3 g/dL (ref 3.5–5.2)
Alkaline Phosphatase: 70 U/L (ref 39–117)
BUN: 14 mg/dL (ref 6–23)
CO2: 22 mEq/L (ref 19–32)
Calcium: 9.3 mg/dL (ref 8.4–10.5)
Chloride: 105 mEq/L (ref 96–112)
Creat: 1.06 mg/dL (ref 0.50–1.35)
GFR, Est African American: 89 mL/min
GFR, Est Non African American: 77 mL/min
Glucose, Bld: 86 mg/dL (ref 70–99)
Potassium: 4.4 mEq/L (ref 3.5–5.3)
Sodium: 137 mEq/L (ref 135–145)
Total Bilirubin: 0.5 mg/dL (ref 0.3–1.2)
Total Protein: 7.7 g/dL (ref 6.0–8.3)

## 2013-07-18 LAB — T-HELPER CELL (CD4) - (RCID CLINIC ONLY)
CD4 % Helper T Cell: 23 % — ABNORMAL LOW (ref 33–55)
CD4 T Cell Abs: 190 /uL — ABNORMAL LOW (ref 400–2700)

## 2013-07-18 LAB — HIV-1 RNA QUANT-NO REFLEX-BLD
HIV 1 RNA Quant: <20 copies/mL (ref <20)
HIV-1 RNA Quant, Log: <1.3 {Log} (ref <1.30)

## 2013-07-21 ENCOUNTER — Other Ambulatory Visit: Payer: Self-pay | Admitting: *Deleted

## 2013-08-13 ENCOUNTER — Ambulatory Visit: Payer: Medicaid Other | Admitting: Infectious Disease

## 2013-08-28 ENCOUNTER — Ambulatory Visit: Payer: Medicaid Other | Admitting: Infectious Disease

## 2013-09-09 ENCOUNTER — Other Ambulatory Visit: Payer: Self-pay | Admitting: Infectious Disease

## 2013-10-10 ENCOUNTER — Other Ambulatory Visit: Payer: Self-pay | Admitting: Infectious Disease

## 2013-10-13 ENCOUNTER — Other Ambulatory Visit: Payer: Self-pay | Admitting: *Deleted

## 2013-10-13 DIAGNOSIS — R197 Diarrhea, unspecified: Secondary | ICD-10-CM

## 2013-10-13 MED ORDER — DIPHENOXYLATE-ATROPINE 2.5-0.025 MG PO TABS: ORAL_TABLET | ORAL | Status: DC

## 2013-10-30 ENCOUNTER — Other Ambulatory Visit: Payer: Self-pay | Admitting: Infectious Disease

## 2013-11-10 ENCOUNTER — Other Ambulatory Visit: Payer: Self-pay | Admitting: Infectious Disease

## 2013-12-08 ENCOUNTER — Other Ambulatory Visit: Payer: Self-pay | Admitting: Infectious Disease

## 2013-12-12 ENCOUNTER — Other Ambulatory Visit: Payer: Self-pay | Admitting: Infectious Disease

## 2014-01-08 ENCOUNTER — Other Ambulatory Visit: Payer: Self-pay | Admitting: Infectious Disease

## 2014-01-09 ENCOUNTER — Other Ambulatory Visit: Payer: Self-pay | Admitting: *Deleted

## 2014-01-09 DIAGNOSIS — R197 Diarrhea, unspecified: Secondary | ICD-10-CM

## 2014-01-09 MED ORDER — DIPHENOXYLATE-ATROPINE 2.5-0.025 MG PO TABS: ORAL_TABLET | ORAL | Status: DC

## 2014-02-05 ENCOUNTER — Other Ambulatory Visit: Payer: Self-pay | Admitting: Infectious Disease

## 2014-03-30 ENCOUNTER — Other Ambulatory Visit: Payer: Self-pay | Admitting: Infectious Disease

## 2014-07-22 ENCOUNTER — Other Ambulatory Visit: Payer: Medicaid Other

## 2014-07-22 ENCOUNTER — Ambulatory Visit: Payer: Medicaid Other

## 2014-07-22 DIAGNOSIS — Z79899 Other long term (current) drug therapy: Secondary | ICD-10-CM

## 2014-07-22 DIAGNOSIS — Z113 Encounter for screening for infections with a predominantly sexual mode of transmission: Secondary | ICD-10-CM

## 2014-07-22 DIAGNOSIS — B20 Human immunodeficiency virus [HIV] disease: Secondary | ICD-10-CM

## 2014-07-22 LAB — CBC WITH DIFFERENTIAL/PLATELET
Basophils Absolute: 0 10*3/uL (ref 0.0–0.1)
Basophils Relative: 0 % (ref 0–1)
Eosinophils Absolute: 0.2 10*3/uL (ref 0.0–0.7)
Eosinophils Relative: 5 % (ref 0–5)
HCT: 42.1 % (ref 39.0–52.0)
Hemoglobin: 14.7 g/dL (ref 13.0–17.0)
Lymphocytes Relative: 28 % (ref 12–46)
Lymphs Abs: 1.3 10*3/uL (ref 0.7–4.0)
MCH: 30.1 pg (ref 26.0–34.0)
MCHC: 34.9 g/dL (ref 30.0–36.0)
MCV: 86.1 fL (ref 78.0–100.0)
MPV: 8.3 fL — ABNORMAL LOW (ref 9.4–12.4)
Monocytes Absolute: 0.7 10*3/uL (ref 0.1–1.0)
Monocytes Relative: 15 % — ABNORMAL HIGH (ref 3–12)
Neutro Abs: 2.3 10*3/uL (ref 1.7–7.7)
Neutrophils Relative %: 52 % (ref 43–77)
Platelets: 318 10*3/uL (ref 150–400)
RBC: 4.89 MIL/uL (ref 4.22–5.81)
RDW: 13.7 % (ref 11.5–15.5)
WBC: 4.5 10*3/uL (ref 4.0–10.5)

## 2014-07-22 LAB — COMPLETE METABOLIC PANEL WITH GFR
ALT: 45 U/L (ref 0–53)
AST: 56 U/L — ABNORMAL HIGH (ref 0–37)
Albumin: 4.3 g/dL (ref 3.5–5.2)
Alkaline Phosphatase: 62 U/L (ref 39–117)
BUN: 16 mg/dL (ref 6–23)
CO2: 22 mEq/L (ref 19–32)
Calcium: 9.1 mg/dL (ref 8.4–10.5)
Chloride: 105 mEq/L (ref 96–112)
Creat: 1.03 mg/dL (ref 0.50–1.35)
GFR, Est African American: >89 mL/min
GFR, Est Non African American: 79 mL/min
Glucose, Bld: 75 mg/dL (ref 70–99)
Potassium: 4.1 mEq/L (ref 3.5–5.3)
Sodium: 137 mEq/L (ref 135–145)
Total Bilirubin: 0.3 mg/dL (ref 0.2–1.2)
Total Protein: 8.1 g/dL (ref 6.0–8.3)

## 2014-07-22 LAB — LIPID PANEL
Cholesterol: 130 mg/dL (ref 0–200)
HDL: 45 mg/dL (ref >39)
LDL Cholesterol: 6 mg/dL (ref 0–99)
Total CHOL/HDL Ratio: 2.9 Ratio
Triglycerides: 396 mg/dL — ABNORMAL HIGH (ref <150)
VLDL: 79 mg/dL — ABNORMAL HIGH (ref 0–40)

## 2014-07-22 LAB — RPR

## 2014-07-23 LAB — HIV-1 RNA QUANT-NO REFLEX-BLD
HIV 1 RNA Quant: <20 copies/mL (ref <20)
HIV-1 RNA Quant, Log: <1.3 {Log} (ref <1.30)

## 2014-07-29 LAB — HLA B*5701: HLA-B*5701 w/rflx HLA-B High: NEGATIVE

## 2014-08-14 ENCOUNTER — Other Ambulatory Visit: Payer: Self-pay | Admitting: Infectious Disease

## 2014-08-14 DIAGNOSIS — B20 Human immunodeficiency virus [HIV] disease: Secondary | ICD-10-CM

## 2014-08-26 ENCOUNTER — Encounter: Payer: Self-pay | Admitting: Infectious Disease

## 2014-08-26 ENCOUNTER — Other Ambulatory Visit: Payer: Self-pay | Admitting: Infectious Disease

## 2014-08-26 ENCOUNTER — Ambulatory Visit (INDEPENDENT_AMBULATORY_CARE_PROVIDER_SITE_OTHER): Payer: Medicaid Other | Admitting: Infectious Disease

## 2014-08-26 VITALS — BP 129/77 | HR 66 | Temp 97.5°F | Wt 154.0 lb

## 2014-08-26 DIAGNOSIS — R059 Cough, unspecified: Secondary | ICD-10-CM

## 2014-08-26 DIAGNOSIS — R05 Cough: Secondary | ICD-10-CM

## 2014-08-26 DIAGNOSIS — B459 Cryptococcosis, unspecified: Secondary | ICD-10-CM

## 2014-08-26 DIAGNOSIS — B182 Chronic viral hepatitis C: Secondary | ICD-10-CM

## 2014-08-26 DIAGNOSIS — Z23 Encounter for immunization: Secondary | ICD-10-CM

## 2014-08-26 DIAGNOSIS — C210 Malignant neoplasm of anus, unspecified: Secondary | ICD-10-CM

## 2014-08-26 DIAGNOSIS — B20 Human immunodeficiency virus [HIV] disease: Secondary | ICD-10-CM

## 2014-08-26 LAB — CBC WITH DIFFERENTIAL/PLATELET
Basophils Absolute: 0.1 10*3/uL (ref 0.0–0.1)
Basophils Relative: 1 % (ref 0–1)
Eosinophils Absolute: 0.1 10*3/uL (ref 0.0–0.7)
Eosinophils Relative: 2 % (ref 0–5)
HCT: 47.7 % (ref 39.0–52.0)
Hemoglobin: 15.6 g/dL (ref 13.0–17.0)
Lymphocytes Relative: 36 % (ref 12–46)
Lymphs Abs: 2 10*3/uL (ref 0.7–4.0)
MCH: 29.7 pg (ref 26.0–34.0)
MCHC: 32.7 g/dL (ref 30.0–36.0)
MCV: 90.7 fL (ref 78.0–100.0)
MPV: 8.7 fL (ref 8.6–12.4)
Monocytes Absolute: 0.9 10*3/uL (ref 0.1–1.0)
Monocytes Relative: 17 % — ABNORMAL HIGH (ref 3–12)
Neutro Abs: 2.4 10*3/uL (ref 1.7–7.7)
Neutrophils Relative %: 44 % (ref 43–77)
Platelets: 385 10*3/uL (ref 150–400)
RBC: 5.26 MIL/uL (ref 4.22–5.81)
RDW: 14.1 % (ref 11.5–15.5)
WBC: 5.5 10*3/uL (ref 4.0–10.5)

## 2014-08-26 LAB — RPR

## 2014-08-26 LAB — COMPLETE METABOLIC PANEL WITH GFR
ALT: 48 U/L (ref 0–53)
AST: 55 U/L — ABNORMAL HIGH (ref 0–37)
Albumin: 3.9 g/dL (ref 3.5–5.2)
Alkaline Phosphatase: 68 U/L (ref 39–117)
BUN: 13 mg/dL (ref 6–23)
CO2: 23 mEq/L (ref 19–32)
Calcium: 9.4 mg/dL (ref 8.4–10.5)
Chloride: 104 mEq/L (ref 96–112)
Creat: 1.05 mg/dL (ref 0.50–1.35)
GFR, Est African American: 89 mL/min
GFR, Est Non African American: 77 mL/min
Glucose, Bld: 76 mg/dL (ref 70–99)
Potassium: 4.7 mEq/L (ref 3.5–5.3)
Sodium: 139 mEq/L (ref 135–145)
Total Bilirubin: 0.3 mg/dL (ref 0.2–1.2)
Total Protein: 7.6 g/dL (ref 6.0–8.3)

## 2014-08-26 MED ORDER — TRIAMCINOLONE ACETONIDE 0.025 % EX OINT: TOPICAL_OINTMENT | Freq: Two times a day (BID) | CUTANEOUS | Status: DC

## 2014-08-26 MED ORDER — OMEPRAZOLE 40 MG PO CPDR: 40.0000 mg | DELAYED_RELEASE_CAPSULE | Freq: Every day | ORAL | Status: DC

## 2014-08-26 NOTE — Progress Notes (Signed)
Subjective:    Patient ID: [name], male    DOB: 1956-03-30, 59 y.o.   MRN: [id number]  HPI  59 year old [place] American male with HIV/AIDS prior Cryptococcal meningitis, prior perfect virological  suppression with most recent VL   cd4 healthy.   Unfortunately apparently [place] was not sending him new meds for nearly a year and he was taking 2014 meds. He came to clinic he states and claims that he was told to stop his meds because they had expired--roughly a month ago. He has only recently started back on Prezista, Norvir and Truvada.  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 Does not appear to have seen Surgery since 04/2012    Review of Systems  Constitutional: Negative for fever, chills, diaphoresis, activity change, appetite change, fatigue and unexpected weight change.  HENT: Negative for congestion, rhinorrhea, sinus pressure, sneezing, sore throat and trouble swallowing.   Eyes: Negative for photophobia and visual disturbance.  Respiratory: Negative for cough, chest tightness, shortness of breath, wheezing and stridor.   Cardiovascular: Negative for chest pain, palpitations and leg swelling.  Gastrointestinal: Negative for nausea, vomiting, abdominal pain, diarrhea, constipation, blood in stool, abdominal distention and anal bleeding.  Genitourinary: Negative for dysuria, hematuria, flank pain and difficulty urinating.  Musculoskeletal: Negative for myalgias, back pain, joint swelling, arthralgias and gait problem.  Skin: Positive for rash. Negative for color change, pallor and wound.  Neurological: Negative for dizziness, tremors, weakness and light-headedness.  Hematological: Negative for adenopathy. Does not bruise/bleed easily.  Psychiatric/Behavioral: Negative for behavioral problems, confusion, sleep disturbance, dysphoric mood, decreased  concentration and agitation.       Objective:   Physical Exam  Constitutional: He is oriented to person, place, and time. He appears cachectic. No distress.  HENT:  Head: Normocephalic and atraumatic.  Mouth/Throat: No oropharyngeal exudate.  Eyes: Conjunctivae and EOM are normal. Pupils are equal, round, and reactive to light. No scleral icterus.  Neck: Normal range of motion. Neck supple. No JVD present.  Cardiovascular: Normal rate, regular rhythm and normal heart sounds.  Exam reveals no gallop and no friction rub.   No murmur heard. Pulmonary/Chest: Effort normal and breath sounds normal. No respiratory distress. He has no wheezes. He has no rales. He exhibits no tenderness.  Abdominal: He exhibits no distension and no mass. There is no tenderness. There is no rebound and no guarding.  Musculoskeletal: He exhibits no edema or tenderness.  Lymphadenopathy:    He has cervical adenopathy.       Right cervical: Superficial cervical and deep cervical adenopathy present.       Left cervical: Superficial cervical adenopathy present.  Neurological: He is alert and oriented to person, place, and time. He has normal reflexes. Coordination normal.  Skin: Skin is warm and dry. No rash noted. He is not diaphoretic. No erythema. No pallor.  Psychiatric: He has a normal mood and affect. His behavior is normal. Judgment and thought content normal.          Assessment & Plan:   HIV: recheck labs today and recheck in one month after being on meds again. Will need to make sure Ronold is seen more frequently he is too apt to get into trouble without close monitoring I spent greater than 25 minutes with the patient including greater than 50% of time in face to face counsel of the  patient and in coordination of their care.  Hepatitis C: check genotype today repeat VL, FIBROSURE. ONCE HE is taking meds reliably can consider change him to Tivicay and Truvada to accommodate HARVONI    Cryptococcal  meningitis history: He has had a CD4 count well over 100 for some time and now off  his fluconazole secondary prophylaxis but now his CD4 may have dropped again  Squamous cell carcinoma status post resection and XRT: Needs followup with  general surgery.  Low testosterone: address once everything else under control  Rash: renew his triamcinolone since this is flaring again

## 2014-08-27 LAB — T-HELPER CELL (CD4) - (RCID CLINIC ONLY)
CD4 % Helper T Cell: 12 % — ABNORMAL LOW (ref 33–55)
CD4 T Cell Abs: 280 /uL — ABNORMAL LOW (ref 400–2700)

## 2014-08-29 LAB — LIVER FIBROSIS, FIBROTEST-ACTITEST
ALT: 47 U/L — ABNORMAL HIGH (ref 9–46)
Alpha-2-Macroglobulin: 557 mg/dL — ABNORMAL HIGH (ref 106–279)
Apolipoprotein A1: 155 mg/dL (ref 94–176)
Bilirubin: 0.3 mg/dL (ref 0.2–1.2)
Fibrosis Score: 0.71
GGT: 92 U/L — ABNORMAL HIGH (ref 3–85)
Haptoglobin: 164 mg/dL (ref 43–212)
Necroinflammat ACT Score: 0.41
Reference ID: 1114806

## 2014-08-31 LAB — HIV-1 RNA ULTRAQUANT REFLEX TO GENTYP+
HIV 1 RNA Quant: 15455 copies/mL — ABNORMAL HIGH (ref <20)
HIV-1 RNA Quant, Log: 4.19 {Log} — ABNORMAL HIGH (ref <1.30)

## 2014-08-31 LAB — HEPATITIS C RNA QUANTITATIVE
HCV Quantitative Log: 6.11 {Log} — ABNORMAL HIGH (ref <1.18)
HCV Quantitative: 1297990 IU/mL — ABNORMAL HIGH (ref <15)

## 2014-09-02 LAB — HEPATITIS C GENOTYPE

## 2014-09-03 LAB — HLA B*5701: HLA-B*5701 w/rflx HLA-B High: NEGATIVE

## 2014-09-08 LAB — HIV-1 GENOTYPR PLUS

## 2014-09-22 ENCOUNTER — Other Ambulatory Visit: Payer: Self-pay | Admitting: Infectious Disease

## 2014-10-05 ENCOUNTER — Encounter: Payer: Self-pay | Admitting: Infectious Disease

## 2014-10-05 ENCOUNTER — Ambulatory Visit (INDEPENDENT_AMBULATORY_CARE_PROVIDER_SITE_OTHER): Payer: Medicaid Other | Admitting: Infectious Disease

## 2014-10-05 VITALS — BP 150/87 | HR 69 | Temp 97.5°F | Wt 155.0 lb

## 2014-10-05 DIAGNOSIS — E291 Testicular hypofunction: Secondary | ICD-10-CM

## 2014-10-05 DIAGNOSIS — B459 Cryptococcosis, unspecified: Secondary | ICD-10-CM

## 2014-10-05 DIAGNOSIS — B182 Chronic viral hepatitis C: Secondary | ICD-10-CM

## 2014-10-05 DIAGNOSIS — B20 Human immunodeficiency virus [HIV] disease: Secondary | ICD-10-CM

## 2014-10-05 DIAGNOSIS — C20 Malignant neoplasm of rectum: Secondary | ICD-10-CM

## 2014-10-05 DIAGNOSIS — R7989 Other specified abnormal findings of blood chemistry: Secondary | ICD-10-CM

## 2014-10-05 LAB — CBC WITH DIFFERENTIAL/PLATELET
Basophils Absolute: 0 10*3/uL (ref 0.0–0.1)
Basophils Relative: 1 % (ref 0–1)
Eosinophils Absolute: 0.2 10*3/uL (ref 0.0–0.7)
Eosinophils Relative: 4 % (ref 0–5)
HCT: 44.2 % (ref 39.0–52.0)
Hemoglobin: 14.7 g/dL (ref 13.0–17.0)
Lymphocytes Relative: 37 % (ref 12–46)
Lymphs Abs: 1.5 10*3/uL (ref 0.7–4.0)
MCH: 30 pg (ref 26.0–34.0)
MCHC: 33.3 g/dL (ref 30.0–36.0)
MCV: 90.2 fL (ref 78.0–100.0)
MPV: 8.3 fL — ABNORMAL LOW (ref 8.6–12.4)
Monocytes Absolute: 0.4 10*3/uL (ref 0.1–1.0)
Monocytes Relative: 10 % (ref 3–12)
Neutro Abs: 1.9 10*3/uL (ref 1.7–7.7)
Neutrophils Relative %: 48 % (ref 43–77)
Platelets: 258 10*3/uL (ref 150–400)
RBC: 4.9 MIL/uL (ref 4.22–5.81)
RDW: 13.7 % (ref 11.5–15.5)
WBC: 4 10*3/uL (ref 4.0–10.5)

## 2014-10-05 LAB — COMPLETE METABOLIC PANEL WITH GFR
ALT: 33 U/L (ref 0–53)
AST: 49 U/L — ABNORMAL HIGH (ref 0–37)
Albumin: 4 g/dL (ref 3.5–5.2)
Alkaline Phosphatase: 74 U/L (ref 39–117)
BUN: 12 mg/dL (ref 6–23)
CO2: 24 mEq/L (ref 19–32)
Calcium: 9.4 mg/dL (ref 8.4–10.5)
Chloride: 105 mEq/L (ref 96–112)
Creat: 0.95 mg/dL (ref 0.50–1.35)
GFR, Est African American: >89 mL/min
GFR, Est Non African American: 87 mL/min
Glucose, Bld: 84 mg/dL (ref 70–99)
Potassium: 4 mEq/L (ref 3.5–5.3)
Sodium: 138 mEq/L (ref 135–145)
Total Bilirubin: 0.5 mg/dL (ref 0.2–1.2)
Total Protein: 7.5 g/dL (ref 6.0–8.3)

## 2014-10-05 MED ORDER — EMTRICITABINE-TENOFOVIR DF 200-300 MG PO TABS: 1.0000 | ORAL_TABLET | Freq: Every day | ORAL | Status: DC

## 2014-10-05 MED ORDER — DOLUTEGRAVIR SODIUM 50 MG PO TABS: 50.0000 mg | ORAL_TABLET | Freq: Every day | ORAL | Status: DC

## 2014-10-05 NOTE — Progress Notes (Signed)
Subjective:    Patient ID: [name], male    DOB: [birth year], 59 y.o.   MRN: [id number]  HPI  59 year old [place] American male with HIV/AIDS prior Cryptococcal meningitis, prior perfect virological  suppression with most recent VL   cd4 healthy.   Unfortunately apparently [place] was not sending him new meds for nearly a year and he was taking 2014 meds. He came to clinic he states and claims that he was told to stop his meds because they had expired--. He has only recently started back on Prezista, Norvir and Truvada.  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 Does not appear to have seen Surgery since 04/2012  He is on Medicaid, and should be taking his medicines about Prezista and Norvir with him but not Truvada. He claims he has that at home.  We discussed changing his antiretroviral regimen around better fit with treatment for his hepatitis C.  We will change him to [facility] and Truvada today but first check a viral load and CD4 count.     Review of Systems  Constitutional: Negative for fever, chills, diaphoresis, activity change, appetite change, fatigue and unexpected weight change.  HENT: Negative for congestion, rhinorrhea, sinus pressure, sneezing, sore throat and trouble swallowing.   Eyes: Negative for photophobia and visual disturbance.  Respiratory: Negative for cough, chest tightness, shortness of breath, wheezing and stridor.   Cardiovascular: Negative for chest pain, palpitations and leg swelling.  Gastrointestinal: Negative for nausea, vomiting, abdominal pain, diarrhea, constipation, blood in stool, abdominal distention and anal bleeding.  Genitourinary: Negative for dysuria, hematuria, flank pain and difficulty urinating.  Musculoskeletal: Negative for myalgias, back pain, joint swelling, arthralgias and gait problem.  Skin: Positive for  rash. Negative for color change, pallor and wound.  Neurological: Negative for dizziness, tremors, weakness and light-headedness.  Hematological: Negative for adenopathy. Does not bruise/bleed easily.  Psychiatric/Behavioral: Negative for behavioral problems, confusion, sleep disturbance, dysphoric mood, decreased concentration and agitation.       Objective:   Physical Exam  Constitutional: He is oriented to person, place, and time. He appears cachectic. No distress.  HENT:  Head: Normocephalic and atraumatic.  Mouth/Throat: No oropharyngeal exudate.  Eyes: Conjunctivae and EOM are normal. Pupils are equal, round, and reactive to light. No scleral icterus.  Neck: Normal range of motion. Neck supple. No JVD present.  Cardiovascular: Normal rate, regular rhythm and normal heart sounds.  Exam reveals no gallop and no friction rub.   No murmur heard. Pulmonary/Chest: Effort normal and breath sounds normal. No respiratory distress. He has no wheezes. He has no rales. He exhibits no tenderness.  Abdominal: He exhibits no distension and no mass. There is no tenderness. There is no rebound and no guarding.  Musculoskeletal: He exhibits no edema or tenderness.  Lymphadenopathy:    He has cervical adenopathy.       Right cervical: Superficial cervical and deep cervical adenopathy present.       Left cervical: Superficial cervical adenopathy present.  Neurological: He is alert and oriented to person, place, and time. He has normal reflexes. Coordination normal.  Skin: Skin is warm and dry. No rash noted. He is not diaphoretic. No erythema. No pallor.  Psychiatric: He has a normal mood and affect. His behavior is normal. Judgment and thought content normal.          Assessment &  Plan:   HIV: recheck labs today. Change to TIVICAY and Truvada bring back in one month's time.  We spent greater than 40  minutes with the patient including greater than 50% of time in face to face counsel of the  patient and in coordination of their care.  Hepatitis C: due to being medicaid pt going to likely only get approved for [employer]. 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  Low testosterone: address once everything else under control

## 2014-10-05 NOTE — Progress Notes (Signed)
Patient ID: [name], male   DOB: 10-14-1955, 59 y.o.   MRN: [id number] HPI: [name] is a 59 y.o. male who is here for the f/u of his HIV and evaluation of hep C  Lab Results  Component Value Date   HCVGENOTYPE 1a 08/26/2014    Allergies: No Known Allergies  Vitals: Temp: 97.5 F (36.4 C) (02/29 0905) Temp Source: Oral (02/29 0905) BP: 150/87 mmHg (02/29 0905) Pulse Rate: 69 (02/29 0905)  Past Medical History: Past Medical History  Diagnosis Date  . Mass of anus 01/26/2011    squamous cell cancer  . HIV or AIDS   . Hemorrhoid   . Cryptococcosis   . Hepatitis C   . Drug abuse   . Meningitis   . Mood disorder   . Pulmonary nodule   . Testicular hypofunction   . Cachexia   . Acute kidney failure     unspec, secondary to medications  . Pain     right leg  . anal ca dx'd 02/2011  . Hypertension   . History of radiation therapy 01/01/2011 -05/18/11    anal, pelvis, inguinal lymph nodes    Social History: History   Social History  . Marital Status: Single    Spouse Name: N/A  . Number of Children: N/A  . Years of Education: N/A   Social History Main Topics  . Smoking status: Current Every Day Smoker -- 0.25 packs/day    Types: Cigarettes  . Smokeless tobacco: Never Used     Comment: 2-3 day  . Alcohol Use: 1.2 - 1.8 oz/week    2-3 Cans of beer per week  . Drug Use: No  . Sexual Activity:    Partners: Male   Other Topics Concern  . None   Social History Narrative    Labs: HIV 1 RNA QUANT (copies/mL)  Date Value  08/26/2014 15455*  07/22/2014 <20  07/17/2013 <20   CD4 T CELL ABS (/uL)  Date Value  08/26/2014 280*  07/17/2013 190*  04/23/2013 240*   HEP B S AB (no units)  Date Value  10/01/2006 NO   HEPATITIS B SURFACE AG (no units)  Date Value  10/01/2006 NO   HCV AB (no units)  Date Value  10/01/2006 YES    Lab Results  Component Value Date   HCVGENOTYPE 1a 08/26/2014    Hepatitis C RNA quantitative Latest Ref Rng  08/26/2014 08/27/2008 10/01/2006  HCV Quantitative <15 IU/mL 1297990(H) 8520000(H) 4,750,000  HCV Quantitative Log <1.18 log 10 6.11(H) 6.93 ...(H) -    AST (U/L)  Date Value  08/26/2014 55*  07/22/2014 56*  07/17/2013 65*   ALT (U/L)  Date Value  08/26/2014 47*  08/26/2014 48  07/22/2014 45  07/17/2013 56*   INR (no units)  Date Value  06/21/2009 1.03  08/27/2008 1.1    CrCl: CrCl cannot be calculated (Unknown ideal weight.).  Fibrosis Score: F3 as assessed by Fibrosure  Child-Pugh Score: Class A  Previous Treatment Regimen: Naive  Assessment: He is co-infected with hep C and HIV. He has medicaid. He does have some issues after talking to him. He is here with his friend [name]. We have been having issue getting in touch with him. Finally able to get him to sign the readiness form and will submit it today. He said to call his friend [name] if we need to get in touch with him next time. We are going anticipating if he was to be approved for  Rozann Lesches through [place], we'll need to change his antiretrovirals. We are changing it to DTG/TRV today.  Recommendations: Change ART to TRV/DTG File for [employer], [place], [place].D., BCPS, AAHIVP Clinical Infectious [place] [facility] 10/05/2014, 10:11 AM

## 2014-10-05 NOTE — Patient Instructions (Signed)
Your new regimen [name] is:  TIVICAY CIRCULAR YELLOW PILL ONCE DAILY WITH  TRUVADA BLUE PILL ONCE DAILY  YOU CAN STOP THE PREZISTA AND NORVIR  WE WILL WORK ON GETTING YOU ON MEDICINES TO CURE YOUR HIV

## 2014-10-06 LAB — HIV-1 RNA ULTRAQUANT REFLEX TO GENTYP+
HIV 1 RNA Quant: 469 copies/mL — ABNORMAL HIGH (ref <20)
HIV-1 RNA Quant, Log: 2.67 {Log} — ABNORMAL HIGH (ref <1.30)

## 2014-10-06 LAB — T-HELPER CELL (CD4) - (RCID CLINIC ONLY)
CD4 % Helper T Cell: 18 % — ABNORMAL LOW (ref 33–55)
CD4 T Cell Abs: 280 /uL — ABNORMAL LOW (ref 400–2700)

## 2014-10-08 ENCOUNTER — Other Ambulatory Visit: Payer: Self-pay | Admitting: Pharmacist Clinician (PhC)/ Clinical Pharmacy Specialist

## 2014-10-08 MED ORDER — RIBAVIRIN 200 MG PO CAPS: ORAL_CAPSULE | ORAL | Status: DC

## 2014-10-08 MED ORDER — OMBITAS-PARITAPRE-RITONA-DASAB 12.5-75-50 &250 MG PO TBPK: 1.0000 | ORAL_TABLET | Freq: Every day | ORAL | Status: DC

## 2014-10-16 LAB — HIV-1 INTEGRASE GENOTYPE

## 2014-11-04 ENCOUNTER — Ambulatory Visit: Payer: Medicaid Other | Admitting: Infectious Disease

## 2014-11-16 ENCOUNTER — Encounter: Payer: Self-pay | Admitting: *Deleted

## 2014-11-16 NOTE — Progress Notes (Signed)
Patient ID: [name], male   DOB: 1956-04-22, 59 y.o.   MRN: [id number] Several attempted calls have been made to schedule Home visit for [name] visit. Number listed of [phone] is not a working number. RN traveled to [place] to see if they know the common whereabouts of the patient. Pt is not a regular at [name] and [name] with [facility] states the pt gets food delivers and the listed address is on [address] Spoke with [name](Clinic RN Lead) at [facility] and she stated it may be a great idea to have a Engineer, materials accompany you with making a unplanned visit to this residents home. Spoke with [name] who agreed to accompany me on a drive-by trip to the patient's home. At 10:15am travel began to the patient's listed home address of [name] [name], [name] and I arrived in the location at 10:30 and noted a home is not present at the address of [address]. The lot appears to have been cleared of a home previously.  F/u with the clinic to make them aware that the address in incorrect. Pt has another address of [name] This address in not recognized by GPS or mapquest and is thought to be a incorrect address as well. At this time the referral is still open until the pt makes contact with the clinic. While reviewing the chart it is noted that the patient was a no show for his last appt on 11/04/14.

## 2015-01-01 ENCOUNTER — Other Ambulatory Visit: Payer: Self-pay | Admitting: Infectious Disease

## 2015-02-05 ENCOUNTER — Other Ambulatory Visit: Payer: Self-pay | Admitting: Infectious Disease

## 2015-02-05 ENCOUNTER — Telehealth: Payer: Self-pay | Admitting: *Deleted

## 2015-02-05 DIAGNOSIS — B20 Human immunodeficiency virus [HIV] disease: Secondary | ICD-10-CM

## 2015-02-05 NOTE — Telephone Encounter (Signed)
Pharmacy requesting refill of medications.  Patient has not been seen, is overdue for an appointment.  He is/was on treatment for HepC, and [facility] has been unable to contact him.  Kinnie Scales, Community RN and Cameron, [facility] Counselor have been engaged to help locate this patient.  His phone number and address are both bogus (no answer at the phone number, address is an empty lot).    RN contacted pharmacy to see if the patient requested this refill or if it is an automatic request -- it is an automatic request instigated by the pharmacy.  Per pharmacist, they are instructed to deliver the medications to the 617-side of the empty lot at [facility]  They can call a "Mr. White" at [phone]) when they are delivering.  This "Mr. White" is not noted in the patient's chart.  RN requested that the pharmacist contact the patient via which ever route the patient set up with them to let him know that this refill is denied until he contacts [facility] with updated information.   Landis Gandy, RN

## 2015-02-09 ENCOUNTER — Telehealth: Payer: Self-pay | Admitting: *Deleted

## 2015-02-09 NOTE — Telephone Encounter (Signed)
RN contacted the patient at the number provided [phone]. Received a message stating this number is not a working number. Before making the call RN spoke with [name] [facility] RN) about the pt's medication refilll request. [name] is working on the patient's refill and my plan was to contact the patient at this number provided to arrange a visit with him. The goal of the visit was to get the patient into care and assist with medication adherence.   [facility] ([place])  has warned me that pt's current living environment is not safe for a women to go alone. I will need to be accompanied by another person before making a visit to the patient's home

## 2015-02-09 NOTE — Telephone Encounter (Signed)
She is working on this - she will be in touch with "Mr. White."  The driver will ask the patient to contact us at [facility] as well.

## 2015-02-09 NOTE — Telephone Encounter (Signed)
Does Ambre know where they live or how to get ahold of them?

## 2015-02-09 NOTE — Telephone Encounter (Signed)
Confirmed that pharmacy has rx of truvada on file. Gave 1 month supply of bactrim, asked Thayer Headings ([place] delivery driver) to let the patient know he must contact us for an appointment for future refills.

## 2015-02-09 NOTE — Telephone Encounter (Signed)
Ok thank you Delories Heinz what an ordeal

## 2015-02-15 ENCOUNTER — Other Ambulatory Visit: Payer: Medicaid Other

## 2015-02-15 DIAGNOSIS — B20 Human immunodeficiency virus [HIV] disease: Secondary | ICD-10-CM

## 2015-02-15 LAB — COMPLETE METABOLIC PANEL WITH GFR
ALT: 34 U/L (ref 0–53)
AST: 42 U/L — ABNORMAL HIGH (ref 0–37)
Albumin: 4.1 g/dL (ref 3.5–5.2)
Alkaline Phosphatase: 56 U/L (ref 39–117)
BUN: 12 mg/dL (ref 6–23)
CO2: 23 mEq/L (ref 19–32)
Calcium: 9.1 mg/dL (ref 8.4–10.5)
Chloride: 106 mEq/L (ref 96–112)
Creat: 1.02 mg/dL (ref 0.50–1.35)
GFR, Est African American: >89 mL/min
GFR, Est Non African American: 80 mL/min
Glucose, Bld: 86 mg/dL (ref 70–99)
Potassium: 4.1 mEq/L (ref 3.5–5.3)
Sodium: 142 mEq/L (ref 135–145)
Total Bilirubin: 0.4 mg/dL (ref 0.2–1.2)
Total Protein: 7.5 g/dL (ref 6.0–8.3)

## 2015-02-15 LAB — CBC WITH DIFFERENTIAL/PLATELET
Basophils Absolute: 0 10*3/uL (ref 0.0–0.1)
Basophils Relative: 1 % (ref 0–1)
Eosinophils Absolute: 0.1 10*3/uL (ref 0.0–0.7)
Eosinophils Relative: 3 % (ref 0–5)
HCT: 43.1 % (ref 39.0–52.0)
Hemoglobin: 14.5 g/dL (ref 13.0–17.0)
Lymphocytes Relative: 26 % (ref 12–46)
Lymphs Abs: 1.3 10*3/uL (ref 0.7–4.0)
MCH: 30.1 pg (ref 26.0–34.0)
MCHC: 33.6 g/dL (ref 30.0–36.0)
MCV: 89.4 fL (ref 78.0–100.0)
MPV: 8.2 fL — ABNORMAL LOW (ref 8.6–12.4)
Monocytes Absolute: 0.8 10*3/uL (ref 0.1–1.0)
Monocytes Relative: 17 % — ABNORMAL HIGH (ref 3–12)
Neutro Abs: 2.6 10*3/uL (ref 1.7–7.7)
Neutrophils Relative %: 53 % (ref 43–77)
Platelets: 249 10*3/uL (ref 150–400)
RBC: 4.82 MIL/uL (ref 4.22–5.81)
RDW: 14 % (ref 11.5–15.5)
WBC: 4.9 10*3/uL (ref 4.0–10.5)

## 2015-02-16 LAB — HIV-1 RNA QUANT-NO REFLEX-BLD
HIV 1 RNA Quant: 37 copies/mL — ABNORMAL HIGH (ref <20)
HIV-1 RNA Quant, Log: 1.57 {Log} — ABNORMAL HIGH (ref <1.30)

## 2015-02-16 LAB — T-HELPER CELL (CD4) - (RCID CLINIC ONLY)
CD4 % Helper T Cell: 20 % — ABNORMAL LOW (ref 33–55)
CD4 T Cell Abs: 270 /uL — ABNORMAL LOW (ref 400–2700)

## 2015-03-02 ENCOUNTER — Other Ambulatory Visit: Payer: Self-pay | Admitting: Infectious Disease

## 2015-03-10 ENCOUNTER — Other Ambulatory Visit: Payer: Self-pay | Admitting: Infectious Disease

## 2015-03-11 ENCOUNTER — Telehealth: Payer: Self-pay | Admitting: Licensed Clinical Social Worker

## 2015-03-11 NOTE — Telephone Encounter (Signed)
THANK YOU

## 2015-03-11 NOTE — Telephone Encounter (Signed)
Ok thank you 

## 2015-03-11 NOTE — Telephone Encounter (Signed)
No he does not need this and can be removed from his list

## 2015-03-11 NOTE — Telephone Encounter (Signed)
RX request for bactrim daily was sent in by [employer] is the patient supposed to be taking this daily?

## 2015-03-31 ENCOUNTER — Encounter: Payer: Self-pay | Admitting: *Deleted

## 2015-03-31 ENCOUNTER — Encounter: Payer: Self-pay | Admitting: Infectious Disease

## 2015-03-31 ENCOUNTER — Ambulatory Visit (INDEPENDENT_AMBULATORY_CARE_PROVIDER_SITE_OTHER): Payer: Medicaid Other | Admitting: Infectious Disease

## 2015-03-31 ENCOUNTER — Other Ambulatory Visit: Payer: Medicaid Other | Admitting: *Deleted

## 2015-03-31 VITALS — BP 159/88 | HR 71 | Temp 97.9°F | Ht 70.0 in | Wt 150.0 lb

## 2015-03-31 DIAGNOSIS — R197 Diarrhea, unspecified: Secondary | ICD-10-CM

## 2015-03-31 DIAGNOSIS — B20 Human immunodeficiency virus [HIV] disease: Secondary | ICD-10-CM

## 2015-03-31 DIAGNOSIS — R21 Rash and other nonspecific skin eruption: Secondary | ICD-10-CM

## 2015-03-31 DIAGNOSIS — B182 Chronic viral hepatitis C: Secondary | ICD-10-CM

## 2015-03-31 DIAGNOSIS — F1099 Alcohol use, unspecified with unspecified alcohol-induced disorder: Secondary | ICD-10-CM | POA: Diagnosis not present

## 2015-03-31 DIAGNOSIS — Z789 Other specified health status: Secondary | ICD-10-CM

## 2015-03-31 DIAGNOSIS — Z23 Encounter for immunization: Secondary | ICD-10-CM | POA: Diagnosis not present

## 2015-03-31 DIAGNOSIS — Z7289 Other problems related to lifestyle: Secondary | ICD-10-CM

## 2015-03-31 HISTORY — DX: Rash and other nonspecific skin eruption: R21

## 2015-03-31 MED ORDER — EMTRICITABINE-TENOFOVIR AF 200-25 MG PO TABS: 1.0000 | ORAL_TABLET | Freq: Every day | ORAL | Status: DC

## 2015-03-31 MED ORDER — TRIAMCINOLONE ACETONIDE 0.5 % EX OINT: 1.0000 "application " | TOPICAL_OINTMENT | Freq: Two times a day (BID) | CUTANEOUS | Status: DC

## 2015-03-31 NOTE — Progress Notes (Signed)
Subjective:    Patient ID: [name], male    DOB: 04-08-1956, 59 y.o.   MRN: [id number]  HPI  [name] is 59 year old with HIV/AIDS, prior cryptococcal meningitis with IRIS who had flare of viremia up to nearly 2 millon but now well controlled on Tivicay and Truvada.  Lab Results  Component Value Date   HIV1RNAQUANT 37* 02/15/2015   Lab Results  Component Value Date   CD4TABS 270* 02/15/2015   CD4TABS 280* 10/05/2014   CD4TABS 280* 08/26/2014    He had PA approval for Paticia Stack pak which MInh Pham worked hard to obtain in March but Neilson was unreachable at that time.  He is now engaged with Ambre who is visiting [place] and his roommate (also HIV +)  [name] is suffering from loose stools 5 x per day and requesting imodium and claiming to be wearing diapers.  He is on PPI and bactrim and no CDiff testing done. He is drinking 45 every 4 days he admits.  Review of Systems  Constitutional: Negative for fever, chills, diaphoresis, activity change, appetite change, fatigue and unexpected weight change.  HENT: Negative for congestion, rhinorrhea, sinus pressure, sneezing, sore throat and trouble swallowing.   Eyes: Negative for photophobia and visual disturbance.  Respiratory: Negative for cough, chest tightness, shortness of breath, wheezing and stridor.   Cardiovascular: Negative for chest pain, palpitations and leg swelling.  Gastrointestinal: Positive for abdominal pain and diarrhea. Negative for nausea, vomiting, constipation, blood in stool, abdominal distention and anal bleeding.  Genitourinary: Negative for dysuria, hematuria, flank pain and difficulty urinating.  Musculoskeletal: Negative for myalgias, back pain, joint swelling, arthralgias and gait problem.  Skin: Positive for rash. Negative for color change, pallor and wound.  Neurological: Negative for dizziness, tremors, weakness and light-headedness.  Hematological: Negative for adenopathy. Does not bruise/bleed easily.    Psychiatric/Behavioral: Negative for behavioral problems, confusion, sleep disturbance, dysphoric mood, decreased concentration and agitation.       Objective:   Physical Exam  Constitutional: He is oriented to person, place, and time. He appears well-developed and well-nourished.  HENT:  Head: Normocephalic and atraumatic.  Eyes: Conjunctivae and EOM are normal.  Neck: Normal range of motion. Neck supple.  Cardiovascular: Normal rate and regular rhythm.   Pulmonary/Chest: Effort normal. No respiratory distress. He has no wheezes.  Abdominal: Soft. He exhibits no distension.  Musculoskeletal: Normal range of motion. He exhibits no edema or tenderness.  Neurological: He is alert and oriented to person, place, and time.  Skin: Skin is warm and dry. Rash noted. No erythema. No pallor.  Psychiatric: He has a normal mood and affect. Cognition and memory are normal.    Rash on arms and neck not improved      Assessment & Plan:   HIV: change to Tivicay with Descovy and RTC in 2 months with repeat VL and CD4, dc bactrim  Diarrhea: check C diff PCR, dc bactrrim. If he does not have CDI can have imodium  Etoh use: claims to only drink one large colt every 4 days but maybe drinking more? I asked him to meet with Jodie  Chronic hepatitis C without hepatic coma. HCV Fibrosure No results found for: [facility] Lab Results  Component Value Date   HCVGENOTYPE 1a 08/26/2014   Hepatitis C RNA quantitative Latest Ref Rng 08/26/2014 08/27/2008 10/01/2006  HCV Quantitative <15 IU/mL 1297990(H) 8520000(H) 4,750,000  HCV Quantitative Log <1.18 log 10 6.11(H) 6.93 (NOTE)  This test utilizes the [place] FDA approved  Roche HCV Test Kit by RT-PCR.(H) -    I would REALLY like to rx his HCV but we NEED Raesean to come reliably to clinic and to keep suppressed viral load, meet with Jodie, I DO THINK WHILE AMBRE is engaged we will have an opportunity to get him cured  Rash: try higher dose of  triamcinolone.  I spent greater than 60 minutes with the patient including greater than 50% of time in face to face counsel of the patient re his HIV, new regimen, his HCV, his etoh use, his rash and his diarrhea and in coordination of their care.

## 2015-03-31 NOTE — Progress Notes (Signed)
Patient ID: [name], male   DOB: 02/08/1956, 59 y.o.   MRN: [id number] RN made a attempted home visit today. RN meet the patient's roommate [name]) who stated the patient just left. RN noted that Jason [name] has trush in his mouth. RN asked Jason. [name] if I could come back and help him and [name] manage their medications tomorrow at 10am. Jason. [name] agreed to a appt tomorrow at 10am

## 2015-04-01 ENCOUNTER — Other Ambulatory Visit: Payer: Medicaid Other | Admitting: *Deleted

## 2015-04-06 ENCOUNTER — Telehealth: Payer: Self-pay | Admitting: *Deleted

## 2015-04-06 NOTE — Telephone Encounter (Signed)
RN contacted the patient and left a message asking the patient to return my call. Purpose of communication is to arrange a date and time to visit the patient to assist him with medication adherence and compliance

## 2015-04-14 ENCOUNTER — Ambulatory Visit: Payer: Self-pay | Admitting: *Deleted

## 2015-04-14 DIAGNOSIS — B20 Human immunodeficiency virus [HIV] disease: Secondary | ICD-10-CM

## 2015-04-19 NOTE — Progress Notes (Signed)
RN attempted calling the patient several times without a return call. RN drove to the patient's home and he was noted to be sitting on the front porch. RN meet with the patient at this time who states he has been taking his medications each day and would like to schedule a visit tomorrow once his friend(Alonzo Jodell Cipro) is home. RN ensured the patient has my correct contact information and offered to come back whenever he needs me. Pt verbalized understanding on instructions given.

## 2015-04-19 NOTE — Patient Instructions (Signed)
Please refer to progress note for details of this note

## 2015-05-06 ENCOUNTER — Other Ambulatory Visit: Payer: Self-pay | Admitting: Infectious Disease

## 2015-07-16 ENCOUNTER — Other Ambulatory Visit: Payer: Self-pay | Admitting: Infectious Disease

## 2015-09-27 ENCOUNTER — Telehealth: Payer: Self-pay | Admitting: *Deleted

## 2015-09-27 ENCOUNTER — Other Ambulatory Visit: Payer: Self-pay | Admitting: Pharmacist Clinician (PhC)/ Clinical Pharmacy Specialist

## 2015-09-27 MED ORDER — EMTRICITABINE-TENOFOVIR DF 200-300 MG PO TABS: 1.0000 | ORAL_TABLET | Freq: Every day | ORAL | Status: DC

## 2015-09-27 NOTE — Telephone Encounter (Signed)
Estill Bamberg, pharmacist with [facility] called stating patient is c/o stomach cramps and thinks it is a side effect of new med, Descovy. He is requesting to try a new medication. Please advise [phone]

## 2015-09-27 NOTE — Progress Notes (Signed)
[name] was complaining about abd cramping to [facility] since switching from TRV to Descovy. We are going to change him back to TRV. Ok per Dr. [name]. Rx sent there.

## 2015-10-04 ENCOUNTER — Other Ambulatory Visit: Payer: Self-pay | Admitting: Infectious Disease

## 2015-10-04 DIAGNOSIS — B20 Human immunodeficiency virus [HIV] disease: Secondary | ICD-10-CM

## 2015-10-04 DIAGNOSIS — K219 Gastro-esophageal reflux disease without esophagitis: Secondary | ICD-10-CM

## 2015-11-11 ENCOUNTER — Other Ambulatory Visit: Payer: Self-pay | Admitting: Infectious Disease

## 2015-12-20 ENCOUNTER — Other Ambulatory Visit: Payer: Self-pay | Admitting: Infectious Disease

## 2015-12-20 DIAGNOSIS — R21 Rash and other nonspecific skin eruption: Secondary | ICD-10-CM

## 2015-12-20 NOTE — Telephone Encounter (Signed)
Left message for the pt.  Follow-up appt made for Thurs., 01/06/16 @ 10:30 AM w/ Dr. [name].  Pharmacy to remind patient too.

## 2016-01-06 ENCOUNTER — Ambulatory Visit: Payer: Medicaid Other | Admitting: Infectious Disease

## 2016-01-17 ENCOUNTER — Other Ambulatory Visit: Payer: Self-pay | Admitting: Infectious Disease

## 2016-01-18 ENCOUNTER — Other Ambulatory Visit: Payer: Self-pay | Admitting: Infectious Disease

## 2016-03-13 ENCOUNTER — Other Ambulatory Visit: Payer: Self-pay | Admitting: Infectious Disease

## 2016-03-13 DIAGNOSIS — B182 Chronic viral hepatitis C: Secondary | ICD-10-CM

## 2016-03-13 DIAGNOSIS — R197 Diarrhea, unspecified: Secondary | ICD-10-CM

## 2016-03-13 DIAGNOSIS — Z789 Other specified health status: Secondary | ICD-10-CM

## 2016-03-13 DIAGNOSIS — B20 Human immunodeficiency virus [HIV] disease: Secondary | ICD-10-CM

## 2016-03-13 DIAGNOSIS — R21 Rash and other nonspecific skin eruption: Secondary | ICD-10-CM

## 2016-03-13 DIAGNOSIS — Z7289 Other problems related to lifestyle: Secondary | ICD-10-CM

## 2016-03-15 ENCOUNTER — Other Ambulatory Visit: Payer: Self-pay | Admitting: Infectious Disease

## 2016-03-15 DIAGNOSIS — R197 Diarrhea, unspecified: Secondary | ICD-10-CM

## 2016-03-15 DIAGNOSIS — R21 Rash and other nonspecific skin eruption: Secondary | ICD-10-CM

## 2016-03-15 DIAGNOSIS — B182 Chronic viral hepatitis C: Secondary | ICD-10-CM

## 2016-03-15 DIAGNOSIS — Z7289 Other problems related to lifestyle: Secondary | ICD-10-CM

## 2016-03-15 DIAGNOSIS — Z789 Other specified health status: Secondary | ICD-10-CM

## 2016-03-15 DIAGNOSIS — B20 Human immunodeficiency virus [HIV] disease: Secondary | ICD-10-CM

## 2016-03-20 ENCOUNTER — Other Ambulatory Visit: Payer: Self-pay | Admitting: Infectious Disease

## 2016-03-20 DIAGNOSIS — B182 Chronic viral hepatitis C: Secondary | ICD-10-CM

## 2016-03-20 DIAGNOSIS — R197 Diarrhea, unspecified: Secondary | ICD-10-CM

## 2016-03-20 DIAGNOSIS — R21 Rash and other nonspecific skin eruption: Secondary | ICD-10-CM

## 2016-03-20 DIAGNOSIS — Z7289 Other problems related to lifestyle: Secondary | ICD-10-CM

## 2016-03-20 DIAGNOSIS — Z789 Other specified health status: Secondary | ICD-10-CM

## 2016-03-20 DIAGNOSIS — B20 Human immunodeficiency virus [HIV] disease: Secondary | ICD-10-CM

## 2016-03-20 NOTE — Telephone Encounter (Signed)
[facility] states pt was changed to Tvicay/Truvada 09/27/15.  [employer] stated that the patient has been refilling these medicines on a monthly basis.

## 2016-03-20 NOTE — Telephone Encounter (Signed)
Needing to find out how the patient is taking HIV rxes.  Tivicay was refilled by this office 01/18/16.  Per [employer] the patient has been taking Truvada, Tivicay and Descovy for the last 2 months.  Prior to that time he was taking Descovy and Truvada.  RN asked [place] to send the patient a very large note to call [facility] to make a return visit with Dr. [name].  Requested Community RN make a home visit ASAP.  Patient last MD visit was March 31, 2015.  Message left for pt, he needs MD appt ASAP

## 2016-03-20 NOTE — Telephone Encounter (Signed)
But not Descovy on top of that correcT?

## 2016-03-27 NOTE — Telephone Encounter (Signed)
Correct

## 2016-04-14 ENCOUNTER — Other Ambulatory Visit: Payer: Self-pay | Admitting: Infectious Disease

## 2016-04-17 ENCOUNTER — Other Ambulatory Visit: Payer: Self-pay | Admitting: Infectious Disease

## 2016-04-17 DIAGNOSIS — B20 Human immunodeficiency virus [HIV] disease: Secondary | ICD-10-CM

## 2016-04-17 NOTE — Telephone Encounter (Signed)
If we can find out if the patient is still taking TIVICAY and DESCOVY versus TIVICAY TIVICAY and Truvada that will help me understand what he should have refilled the pharmacy have record of what he is been refilling? He does lay needs to be seen in clinic as well. I would be happy to still fill TIVICAY and DESCOVY versus TIVICAY and Truvada if he has been taking these medications reliably but he does need to come back into clinic I would also like to treat his hepatitis C but he is not helping his cause when he doesn't show up to clinic frequently

## 2016-04-19 ENCOUNTER — Other Ambulatory Visit: Payer: Medicaid Other

## 2016-04-19 DIAGNOSIS — B182 Chronic viral hepatitis C: Secondary | ICD-10-CM

## 2016-04-19 DIAGNOSIS — Z7289 Other problems related to lifestyle: Secondary | ICD-10-CM

## 2016-04-19 DIAGNOSIS — R21 Rash and other nonspecific skin eruption: Secondary | ICD-10-CM

## 2016-04-19 DIAGNOSIS — B20 Human immunodeficiency virus [HIV] disease: Secondary | ICD-10-CM

## 2016-04-19 DIAGNOSIS — Z789 Other specified health status: Secondary | ICD-10-CM

## 2016-04-19 DIAGNOSIS — R197 Diarrhea, unspecified: Secondary | ICD-10-CM

## 2016-04-19 LAB — CBC WITH DIFFERENTIAL/PLATELET
Basophils Absolute: 48 cells/uL (ref 0–200)
Basophils Relative: 1 %
Eosinophils Absolute: 192 cells/uL (ref 15–500)
Eosinophils Relative: 4 %
HCT: 45.5 % (ref 38.5–50.0)
Hemoglobin: 15.3 g/dL (ref 13.2–17.1)
Lymphocytes Relative: 35 %
Lymphs Abs: 1680 cells/uL (ref 850–3900)
MCH: 29.9 pg (ref 27.0–33.0)
MCHC: 33.6 g/dL (ref 32.0–36.0)
MCV: 89 fL (ref 80.0–100.0)
MPV: 8.7 fL (ref 7.5–12.5)
Monocytes Absolute: 720 cells/uL (ref 200–950)
Monocytes Relative: 15 %
Neutro Abs: 2160 cells/uL (ref 1500–7800)
Neutrophils Relative %: 45 %
Platelets: 284 10*3/uL (ref 140–400)
RBC: 5.11 MIL/uL (ref 4.20–5.80)
RDW: 13.8 % (ref 11.0–15.0)
WBC: 4.8 10*3/uL (ref 3.8–10.8)

## 2016-04-19 LAB — COMPLETE METABOLIC PANEL WITH GFR
ALT: 31 U/L (ref 9–46)
AST: 39 U/L — ABNORMAL HIGH (ref 10–35)
Albumin: 3.9 g/dL (ref 3.6–5.1)
Alkaline Phosphatase: 64 U/L (ref 40–115)
BUN: 16 mg/dL (ref 7–25)
CO2: 27 mmol/L (ref 20–31)
Calcium: 9.3 mg/dL (ref 8.6–10.3)
Chloride: 106 mmol/L (ref 98–110)
Creat: 0.91 mg/dL (ref 0.70–1.25)
GFR, Est African American: >89 mL/min (ref >=60)
GFR, Est Non African American: >89 mL/min (ref >=60)
Glucose, Bld: 85 mg/dL (ref 65–99)
Potassium: 4 mmol/L (ref 3.5–5.3)
Sodium: 141 mmol/L (ref 135–146)
Total Bilirubin: 0.3 mg/dL (ref 0.2–1.2)
Total Protein: 7.7 g/dL (ref 6.1–8.1)

## 2016-04-20 ENCOUNTER — Ambulatory Visit: Payer: Medicaid Other

## 2016-04-20 LAB — HIV-1 RNA QUANT-NO REFLEX-BLD
HIV 1 RNA Quant: 390 copies/mL — ABNORMAL HIGH (ref <20)
HIV-1 RNA Quant, Log: 2.59 Log copies/mL — ABNORMAL HIGH (ref <1.30)

## 2016-04-20 LAB — T-HELPER CELL (CD4) - (RCID CLINIC ONLY)
CD4 % Helper T Cell: 20 % — ABNORMAL LOW (ref 33–55)
CD4 T Cell Abs: 340 /uL — ABNORMAL LOW (ref 400–2700)

## 2016-04-20 LAB — RPR

## 2016-05-03 ENCOUNTER — Ambulatory Visit: Payer: Medicaid Other | Admitting: *Deleted

## 2016-05-03 ENCOUNTER — Encounter: Payer: Self-pay | Admitting: Infectious Disease

## 2016-05-03 ENCOUNTER — Ambulatory Visit (INDEPENDENT_AMBULATORY_CARE_PROVIDER_SITE_OTHER): Payer: Medicaid Other | Admitting: Infectious Disease

## 2016-05-03 VITALS — BP 131/79 | HR 77 | Temp 97.9°F | Ht 70.0 in | Wt 143.0 lb

## 2016-05-03 DIAGNOSIS — B20 Human immunodeficiency virus [HIV] disease: Secondary | ICD-10-CM

## 2016-05-03 DIAGNOSIS — Z23 Encounter for immunization: Secondary | ICD-10-CM

## 2016-05-03 DIAGNOSIS — B182 Chronic viral hepatitis C: Secondary | ICD-10-CM | POA: Diagnosis not present

## 2016-05-03 DIAGNOSIS — F1010 Alcohol abuse, uncomplicated: Secondary | ICD-10-CM

## 2016-05-03 DIAGNOSIS — R197 Diarrhea, unspecified: Secondary | ICD-10-CM | POA: Diagnosis not present

## 2016-05-03 HISTORY — DX: Alcohol abuse, uncomplicated: F10.10

## 2016-05-03 NOTE — Progress Notes (Signed)
Chief complaint: loose stools and "pain in my liver"  Subjective:    Patient ID: [name], male    DOB: 02-17-1956, 60 y.o.   MRN: [id number]  HPI  [name] is 60 year old with HIV/AIDS, prior cryptococcal meningitis with IRIS who had flare of viremia up to nearly 2 millon then l controlled on Tivicay and Truvada but then lost to followup. He claims that Descovy made him sick when we tried to go with this so went back to Tivicay and Truvada.  His VL is NOT perfectly controlled in 300s but not too bad. He claims to not be missing doses bu this is doubtful.  He c/o diarrhea up to 7x per day as he complained of last visit. He also c/o abdominal pain RUQ, right flank and LLQ.  His Fibrosure for HCV was F3 but we have not #1 been able to engage him in alcohol cessation counselling, #2 been able to get him to come to clinc more than 1-2 x maximum in a year despite asking him to come back sooner to get him plugged into HCV treatment  Per my prior notes we even were able to get a  Paticia Stack pak in past approved but Bless had been unreachable.  Past Medical History:  Diagnosis Date  . Acute kidney failure    unspec, secondary to medications  . anal ca dx'd 02/2011  . Cachexia ([place])   . 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 Drug abuse   . Hemorrhoid   . Hepatitis C   . History of radiation therapy 01/01/2011 -05/18/11   anal, pelvis, inguinal lymph nodes  . HIV or AIDS   . Hypertension   . Mass of anus 01/26/2011   squamous cell cancer  . Meningitis   . Mood disorder ([place])   . Pain    right leg  . Pulmonary nodule   . Rash and nonspecific skin eruption 03/31/2015  . Testicular hypofunction     Past Surgical History:  Procedure Laterality Date  . MASS EXCISION  01/26/2011   squamous cell carcinoma    No family history on file.    Social History   Social History  . Marital status: Single    Spouse name: N/A  . Number of children: N/A  . Years of education: N/A   Social History Main  Topics  . Smoking status: Current Every Day Smoker    Packs/day: 0.25    Types: Cigarettes  . Smokeless tobacco: Never Used     Comment: 2-3 day  . Alcohol use 1.2 - 1.8 oz/week    2 - 3 Cans of beer per week  . Drug use: No  . Sexual activity: Not Currently    Partners: Male     Comment: given condoms   Other Topics Concern  . None   Social History Narrative  . None    No Known Allergies   Current Outpatient Prescriptions:  .  emtricitabine-tenofovir (TRUVADA) 200-300 MG tablet, Take 1 tablet by mouth daily., Disp: 30 tablet, Rfl: 11 .  omeprazole (PRILOSEC) 40 MG capsule, TAKE 1 CAPSULE (40 MG TOTAL) BY MOUTH DAILY., Disp: 30 capsule, Rfl: 5 .  TIVICAY 50 MG tablet, TAKE 1 TABLET (50 MG TOTAL) BY MOUTH DAILY., Disp: 30 tablet, Rfl: 0   Review of Systems  Constitutional: Negative for activity change, appetite change, chills, diaphoresis, fatigue, fever and unexpected weight change.  HENT: Negative for congestion, rhinorrhea, sinus pressure, sneezing, sore throat and trouble swallowing.  Eyes: Negative for photophobia and visual disturbance.  Respiratory: Negative for cough, chest tightness, shortness of breath, wheezing and stridor.   Cardiovascular: Negative for chest pain, palpitations and leg swelling.  Gastrointestinal: Positive for abdominal pain and diarrhea. Negative for abdominal distention, anal bleeding, blood in stool, constipation, nausea and vomiting.  Genitourinary: Negative for difficulty urinating, dysuria, flank pain and hematuria.  Musculoskeletal: Negative for arthralgias, back pain, gait problem, joint swelling and myalgias.  Skin: Negative for color change, pallor and wound.  Neurological: Negative for dizziness, tremors, weakness and light-headedness.  Hematological: Negative for adenopathy. Does not bruise/bleed easily.  Psychiatric/Behavioral: Negative for agitation, behavioral problems, confusion, decreased concentration, dysphoric mood and sleep  disturbance.       Objective:   Physical Exam  Constitutional: He is oriented to person, place, and time. He appears well-developed and well-nourished.  HENT:  Head: Normocephalic and atraumatic.  Eyes: Conjunctivae and EOM are normal.  Neck: Normal range of motion. Neck supple.  Cardiovascular: Normal rate and regular rhythm.   Pulmonary/Chest: Effort normal. No respiratory distress. He has no wheezes.  Abdominal: Soft. He exhibits no distension. There is tenderness. There is no rebound.  Musculoskeletal: Normal range of motion. He exhibits no edema or tenderness.  Neurological: He is alert and oriented to person, place, and time.  Skin: Skin is warm and dry. No erythema. No pallor.  Psychiatric: He has a normal mood and affect. Cognition and memory are normal.          Assessment & Plan:   HIV: continue Tivicay and Truvada and recheck VL today  Diarrhea: worked up before and will do so again if he will stay long enough during his visit.  Etoh use: I asked him to meet with Jodie but he left saying he needed to get lunch  Chronic hepatitis C without hepatic coma. HCV Fibrosure No results found for: [facility] Lab Results  Component Value Date   HCVGENOTYPE 1a 08/26/2014   Hepatitis C RNA quantitative Latest Ref Rng & Units 08/26/2014 08/27/2008 10/01/2006  HCV Quantitative <15 IU/mL 1,297,990(H) 8520000(H) 4,750,000  HCV Quantitative Log <1.18 log 10 6.11(H) 6.93 (NOTE)  This test utilizes the [place] FDA approved Roche HCV Test Kit by RT-PCR.(H) -    I am also sending an NS5A test and repeat VL in case we can get him in care reliably and want to try to treat him again   I spent greater than 40 minutes with the patient including greater than 50% of time in face to face counsel of the patient re his HIV, his HCV, his etoh use,  his diarrhea and in coordination of his care.   

## 2016-05-03 NOTE — Telephone Encounter (Signed)
Per [employer] the patient is refilling Tivicay and Truvada.

## 2016-05-03 NOTE — Addendum Note (Signed)
Addended by: Lorne Skeens D on: 05/03/2016 10:23 AM   Modules accepted: Orders

## 2016-05-03 NOTE — BH Specialist Note (Signed)
Counselor was asked by lab to meet with [name].  Patient was talking to himself when counselor walked in the exam room.  Patient immediately communicated that he was tired of meeting with people and that he wanted to go.  Counselor was not allowed to speak with him in depth.  In the middle of introducing the counseling services, patient got up and said he was ready to leave.  Counselor asked patient to stick around a minute so that his nurse could be found to give him his discharge papers.  Patient did not wait and went to the check out area. Patient was met with his paperwork from lab that would allow for him to check out.   Rolena Infante, MA, LPC [facility]

## 2016-05-04 LAB — HIV-1 RNA ULTRAQUANT REFLEX TO GENTYP+
HIV 1 RNA Quant: 25 copies/mL — ABNORMAL HIGH (ref <20)
HIV-1 RNA Quant, Log: 1.4 Log copies/mL — ABNORMAL HIGH (ref <1.30)

## 2016-05-05 LAB — HEPATITIS C RNA QUANTITATIVE
HCV Quantitative Log: 5.81 {Log} — ABNORMAL HIGH (ref <1.18)
HCV Quantitative: 641599 IU/mL — ABNORMAL HIGH (ref <15)

## 2016-05-11 LAB — HCV VIRAL RNA GEN3 NS5A DRUG RESIST: HCV NS5a Subtype: NOT DETECTED

## 2016-05-11 NOTE — Addendum Note (Signed)
Addended by: Landis Gandy on: 05/11/2016 02:33 PM   Modules accepted: Orders

## 2016-05-12 ENCOUNTER — Other Ambulatory Visit: Payer: Self-pay | Admitting: Infectious Disease

## 2016-05-12 DIAGNOSIS — B20 Human immunodeficiency virus [HIV] disease: Secondary | ICD-10-CM

## 2016-05-12 DIAGNOSIS — R21 Rash and other nonspecific skin eruption: Secondary | ICD-10-CM

## 2016-06-07 ENCOUNTER — Encounter: Payer: Self-pay | Admitting: Infectious Disease

## 2016-06-07 ENCOUNTER — Ambulatory Visit (INDEPENDENT_AMBULATORY_CARE_PROVIDER_SITE_OTHER): Payer: Medicaid Other | Admitting: Infectious Disease

## 2016-06-07 VITALS — BP 160/70 | HR 76 | Temp 98.1°F | Wt 148.0 lb

## 2016-06-07 DIAGNOSIS — R195 Other fecal abnormalities: Secondary | ICD-10-CM

## 2016-06-07 DIAGNOSIS — R197 Diarrhea, unspecified: Secondary | ICD-10-CM

## 2016-06-07 DIAGNOSIS — B182 Chronic viral hepatitis C: Secondary | ICD-10-CM | POA: Diagnosis not present

## 2016-06-07 DIAGNOSIS — F1010 Alcohol abuse, uncomplicated: Secondary | ICD-10-CM | POA: Diagnosis not present

## 2016-06-07 DIAGNOSIS — Z923 Personal history of irradiation: Secondary | ICD-10-CM | POA: Diagnosis not present

## 2016-06-07 DIAGNOSIS — B20 Human immunodeficiency virus [HIV] disease: Secondary | ICD-10-CM

## 2016-06-07 HISTORY — DX: Human immunodeficiency virus (HIV) disease: B20

## 2016-06-07 HISTORY — DX: Other fecal abnormalities: R19.5

## 2016-06-07 NOTE — Progress Notes (Signed)
Chief complaint: loose stools   Subjective:    Patient ID: [name], male    DOB: Apr 19, 1956, 60 y.o.   MRN: [id number]  HPI  Cassian is 60 year old with HIV/AIDS, prior cryptococcal meningitis with IRIS who had flare of viremia up to nearly 2 millon then l controlled on Tivicay and Truvada but then lost to followup. He claims that Descovy made him sick when we tried to go with this so went back to Tivicay and Truvada.  His VL is NOT perfectly controlled in 300s but not too bad. He claims to not be missing doses bu this is doubtful.  He c/o diarrhea up to 7x per day as he complained of last visit.  His Fibrosure for HCV was F3 but we have not #1 been able to engage him in alcohol cessation counselling, #2 been able to get him to come to clinc more than 1-2 x maximum in a year despite asking him to come back sooner to get him plugged into HCV treatment  Per my prior notes we even were able to get a  Paticia Stack pak in past approved but [facility] had been unreachable.  Follow-up visit having been seen in September. His viral load is reasonably controlled and his CD4 count is healthy. Yet again he is complaining of having loose bowel movements but 5-6 throughout the day after taking his antiretrovirals. I offered again to have his stool tested for C. difficile difficile and other infectious diseases. I'm not sure if that is loose stools may also be a a remnant of radiation therapy.  He claims to be cutting down on his alcohol use and occasionally drinking a 20 ounce bottle of beer once to twice per week. We discussed starting him on anti-hep C medications but he wants to wait for a few months. He did not want to meet with Jody today.  Past Medical History:  Diagnosis Date  . Acute kidney failure    unspec, secondary to medications  . Alcohol abuse 05/03/2016  . anal ca dx'd 02/2011  . Cachexia ([place])   . 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 Drug abuse   . Hemorrhoid   . Hepatitis C   . History of  radiation therapy 01/01/2011 -05/18/11   anal, pelvis, inguinal lymph nodes  . HIV or AIDS   . Hypertension   . Mass of anus 01/26/2011   squamous cell cancer  . Meningitis   . Mood disorder ([place])   . Pain    right leg  . Pulmonary nodule   . Rash and nonspecific skin eruption 03/31/2015  . Testicular hypofunction     Past Surgical History:  Procedure Laterality Date  . MASS EXCISION  01/26/2011   squamous cell carcinoma    No family history on file.    Social History   Social History  . Marital status: Single    Spouse name: N/A  . Number of children: N/A  . Years of education: N/A   Social History Main Topics  . Smoking status: Current Every Day Smoker    Packs/day: 0.25    Types: Cigarettes  . Smokeless tobacco: Never Used     Comment: 2-3 day  . Alcohol use 1.2 - 1.8 oz/week    2 - 3 Cans of beer per week  . Drug use: No  . Sexual activity: Not Currently    Partners: Male     Comment: given condoms   Other Topics Concern  . None  Social History Narrative  . None    No Known Allergies   Current Outpatient Prescriptions:  .  emtricitabine-tenofovir (TRUVADA) 200-300 MG tablet, Take 1 tablet by mouth daily., Disp: 30 tablet, Rfl: 11 .  omeprazole (PRILOSEC) 40 MG capsule, TAKE 1 CAPSULE (40 MG TOTAL) BY MOUTH DAILY., Disp: 30 capsule, Rfl: 5 .  TIVICAY 50 MG tablet, TAKE 1 TABLET (50 MG TOTAL) BY MOUTH DAILY., Disp: 30 tablet, Rfl: 5 .  triamcinolone ointment (KENALOG) 0.5 %, APPLY 1 APPLICATION TOPICALLY TWO   (TWO) TIMES DAILY., Disp: 30 g, Rfl: 5   Review of Systems  Constitutional: Negative for activity change, appetite change, chills, diaphoresis, fatigue, fever and unexpected weight change.  HENT: Negative for congestion, rhinorrhea, sinus pressure, sneezing, sore throat and trouble swallowing.   Eyes: Negative for photophobia and visual disturbance.  Respiratory: Negative for cough, chest tightness, shortness of breath, wheezing and stridor.     Cardiovascular: Negative for chest pain, palpitations and leg swelling.  Gastrointestinal: Positive for abdominal pain and diarrhea. Negative for abdominal distention, anal bleeding, blood in stool, constipation, nausea and vomiting.  Genitourinary: Negative for difficulty urinating, dysuria, flank pain and hematuria.  Musculoskeletal: Negative for arthralgias, back pain, gait problem, joint swelling and myalgias.  Skin: Negative for color change, pallor and wound.  Neurological: Negative for dizziness, tremors, weakness and light-headedness.  Hematological: Negative for adenopathy. Does not bruise/bleed easily.  Psychiatric/Behavioral: Negative for agitation, behavioral problems, confusion, decreased concentration, dysphoric mood and sleep disturbance.       Objective:   Physical Exam  Constitutional: He is oriented to person, place, and time. He appears well-developed and well-nourished.  HENT:  Head: Normocephalic and atraumatic.  Eyes: Conjunctivae and EOM are normal.  Neck: Normal range of motion. Neck supple.  Cardiovascular: Normal rate and regular rhythm.   Pulmonary/Chest: Effort normal. No respiratory distress. He has no wheezes.  Abdominal: Soft. He exhibits no distension. There is no rebound.  Musculoskeletal: Normal range of motion. He exhibits no edema or tenderness.  Neurological: He is alert and oriented to person, place, and time.  Skin: Skin is warm and dry. No erythema. No pallor.  Psychiatric: He has a normal mood and affect. Cognition and memory are normal.          Assessment & Plan:   HIV: continue Tivicay and Truvada and Return to clinic in 2 months time  Diarrhea: worked up before and will do so again if he will come back with stool that we can analyze I suspect it may be due to radiation effect  Etoh use: I asked him to meet with Jodie but he again did not want to meet with her  Chronic hepatitis C without hepatic coma. HCV Fibrosure F# No results  found for: [facility] Lab Results  Component Value Date   HCVGENOTYPE 1a 08/26/2014   Hepatitis C RNA quantitative Latest Ref Rng & Units 05/03/2016 08/26/2014 08/27/2008 10/01/2006  HCV Quantitative <15 IU/mL 641,599(H) 1,297,990(H) 8520000(H) 4,750,000  HCV Quantitative Log <1.18 log 10 5.81(H) 6.11(H) 6.93 (NOTE)  This test utilizes the [place] FDA approved Roche HCV Test Kit by RT-PCR.(H) -    Will [facility] pharmacy. Will Medicaid pay for his HCV while he is actively driking etoh though he is NOT using IVDU and he has reasonable control of his HIV   I spent greater than 25  minutes with the patient including greater than 50% of time in face to face counsel of the patient re his HIV, his HCV,  his etoh use,  his diarrhea and in coordination of his care.

## 2016-07-07 ENCOUNTER — Other Ambulatory Visit: Payer: Self-pay | Admitting: Infectious Disease

## 2016-07-19 ENCOUNTER — Other Ambulatory Visit: Payer: Medicaid Other

## 2016-07-19 DIAGNOSIS — B20 Human immunodeficiency virus [HIV] disease: Secondary | ICD-10-CM

## 2016-07-19 LAB — COMPLETE METABOLIC PANEL WITH GFR
ALT: 40 U/L (ref 9–46)
AST: 51 U/L — ABNORMAL HIGH (ref 10–35)
Albumin: 4.3 g/dL (ref 3.6–5.1)
Alkaline Phosphatase: 58 U/L (ref 40–115)
BUN: 14 mg/dL (ref 7–25)
CO2: 27 mmol/L (ref 20–31)
Calcium: 9.6 mg/dL (ref 8.6–10.3)
Chloride: 103 mmol/L (ref 98–110)
Creat: 1.03 mg/dL (ref 0.70–1.25)
GFR, Est African American: >89 mL/min (ref >=60)
GFR, Est Non African American: 78 mL/min (ref >=60)
Glucose, Bld: 70 mg/dL (ref 65–99)
Potassium: 4.6 mmol/L (ref 3.5–5.3)
Sodium: 139 mmol/L (ref 135–146)
Total Bilirubin: 0.5 mg/dL (ref 0.2–1.2)
Total Protein: 8.2 g/dL — ABNORMAL HIGH (ref 6.1–8.1)

## 2016-07-19 LAB — CBC WITH DIFFERENTIAL/PLATELET
Basophils Absolute: 0 cells/uL (ref 0–200)
Basophils Relative: 0 %
Eosinophils Absolute: 220 cells/uL (ref 15–500)
Eosinophils Relative: 4 %
HCT: 46.8 % (ref 38.5–50.0)
Hemoglobin: 15.6 g/dL (ref 13.2–17.1)
Lymphocytes Relative: 36 %
Lymphs Abs: 1980 cells/uL (ref 850–3900)
MCH: 30.1 pg (ref 27.0–33.0)
MCHC: 33.3 g/dL (ref 32.0–36.0)
MCV: 90.3 fL (ref 80.0–100.0)
MPV: 8.7 fL (ref 7.5–12.5)
Monocytes Absolute: 660 cells/uL (ref 200–950)
Monocytes Relative: 12 %
Neutro Abs: 2640 cells/uL (ref 1500–7800)
Neutrophils Relative %: 48 %
Platelets: 287 10*3/uL (ref 140–400)
RBC: 5.18 MIL/uL (ref 4.20–5.80)
RDW: 13.6 % (ref 11.0–15.0)
WBC: 5.5 10*3/uL (ref 3.8–10.8)

## 2016-07-20 LAB — T-HELPER CELL (CD4) - (RCID CLINIC ONLY)
CD4 % Helper T Cell: 22 % — ABNORMAL LOW (ref 33–55)
CD4 T Cell Abs: 450 /uL (ref 400–2700)

## 2016-07-20 LAB — RPR

## 2016-07-22 LAB — HIV RNA, RTPCR W/R GT (RTI, PI,INT)
HIV-1 RNA, QN PCR: 138 copies/mL — ABNORMAL HIGH
HIV-1 RNA, QN PCR: 2.14 Log copies/mL — ABNORMAL HIGH

## 2016-08-09 ENCOUNTER — Encounter: Payer: Self-pay | Admitting: Infectious Disease

## 2016-08-09 ENCOUNTER — Ambulatory Visit (INDEPENDENT_AMBULATORY_CARE_PROVIDER_SITE_OTHER): Payer: Medicaid Other | Admitting: Infectious Disease

## 2016-08-09 VITALS — Wt 145.0 lb

## 2016-08-09 DIAGNOSIS — F1010 Alcohol abuse, uncomplicated: Secondary | ICD-10-CM | POA: Diagnosis not present

## 2016-08-09 DIAGNOSIS — Z923 Personal history of irradiation: Secondary | ICD-10-CM | POA: Diagnosis not present

## 2016-08-09 DIAGNOSIS — B182 Chronic viral hepatitis C: Secondary | ICD-10-CM | POA: Diagnosis present

## 2016-08-09 DIAGNOSIS — B20 Human immunodeficiency virus [HIV] disease: Secondary | ICD-10-CM | POA: Diagnosis not present

## 2016-08-09 DIAGNOSIS — K219 Gastro-esophageal reflux disease without esophagitis: Secondary | ICD-10-CM | POA: Diagnosis not present

## 2016-08-09 DIAGNOSIS — F0630 Mood disorder due to known physiological condition, unspecified: Secondary | ICD-10-CM

## 2016-08-09 HISTORY — DX: Gastro-esophageal reflux disease without esophagitis: K21.9

## 2016-08-09 MED ORDER — OMEPRAZOLE 40 MG PO CPDR: DELAYED_RELEASE_CAPSULE | ORAL | 5 refills | Status: DC

## 2016-08-09 NOTE — Patient Instructions (Signed)
Make an appointment with [facility] for 2 weeks  MAKE SURE THAT YOu stop drinking alcohol completely  We will then start process to get you on Hep C drugs but you will need to pick them up here and come to see [facility] every 2 weeks

## 2016-08-09 NOTE — Progress Notes (Signed)
HPI: [name] is a 61 y.o. male who is here for his HIV/hep C f/u.   Lab Results  Component Value Date   HCVGENOTYPE 1a 08/26/2014    Allergies: No Known Allergies  Vitals:    Past Medical History: Past Medical History:  Diagnosis Date  . Acute kidney failure    unspec, secondary to medications  . AIDS ([place]) 06/07/2016  . Alcohol abuse 05/03/2016  . anal ca dx'd 02/2011  . Cachexia ([place])   . 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 Drug abuse   . GERD (gastroesophageal reflux disease) 08/09/2016  . Hemorrhoid   . Hepatitis C   . History of radiation therapy 01/01/2011 -05/18/11   anal, pelvis, inguinal lymph nodes  . HIV or AIDS   . Hypertension   . Loose stools 06/07/2016  . Mass of anus 01/26/2011   squamous cell cancer  . Meningitis   . Mood disorder ([place])   . Pain    right leg  . Pulmonary nodule   . Rash and nonspecific skin eruption 03/31/2015  . Testicular hypofunction     Social History: Social History   Social History  . Marital status: Single    Spouse name: N/A  . Number of children: N/A  . Years of education: N/A   Social History Main Topics  . Smoking status: Current Every Day Smoker    Packs/day: 0.25    Types: Cigarettes  . Smokeless tobacco: Never Used     Comment: 2-3 day  . Alcohol use 1.2 - 1.8 oz/week    2 - 3 Cans of beer per week  . Drug use: No  . Sexual activity: Not Currently    Partners: Male    Birth control/ protection: Condom     Comment: given condoms   Other Topics Concern  . None   Social History Narrative  . None    Labs: HIV 1 RNA Quant (copies/mL)  Date Value  05/03/2016 25 (H)  04/19/2016 390 (H)  02/15/2015 37 (H)   CD4 T Cell Abs (/uL)  Date Value  07/19/2016 450  04/19/2016 340 (L)  02/15/2015 270 (L)   Hep B S Ab (no units)  Date Value  10/01/2006 NO   Hepatitis B Surface Ag (no units)  Date Value  10/01/2006 NO   HCV Ab (no units)  Date Value  10/01/2006 YES    Lab Results  Component Value Date    HCVGENOTYPE 1a 08/26/2014    Hepatitis C RNA quantitative Latest Ref Rng & Units 05/03/2016 08/26/2014 08/27/2008 10/01/2006  HCV Quantitative <15 IU/mL 641,599(H) 1,297,990(H) 8520000(H) 4,750,000  HCV Quantitative Log <1.18 log 10 5.81(H) 6.11(H) 6.93 (NOTE)  This test utilizes the [place] FDA approved Roche HCV Test Kit by RT-PCR.(H) -    AST (U/L)  Date Value  07/19/2016 51 (H)  04/19/2016 39 (H)  02/15/2015 42 (H)   ALT (U/L)  Date Value  07/19/2016 40  04/19/2016 31  02/15/2015 34  08/26/2014 47 (H)   INR (no units)  Date Value  06/21/2009 1.03  08/27/2008 1.1    CrCl: CrCl cannot be calculated (Patient's most recent lab result is older than the maximum 21 days allowed.).  Fibrosis Score: F3 as assessed by Fibrosure  Child-Pugh Score: Class A  Previous Treatment Regimen: None  Assessment: [name] has had a hx of non-compliance. His HIV is relatively well controlled now. He was approved in the past with [place] at one point when he had insurance. However, he  was never started on it. We are thinking about treating his hep C now. Communication was a nightmare with him. We are planning to bring him back every 2 wks until he is done with treatment if he is approved through [place]. He is going to try to abstain from alcohol now. He is going to see me in 2 wks to get the process started.   Recommendations:  Come back in 2 wks to get the process started Will bring back every 2 wks if approved  [name], [place].D., BCPS, AAHIVP Clinical Infectious [place] for [facility] 08/09/2016, 11:57 AM

## 2016-08-09 NOTE — Progress Notes (Signed)
Chief complaint: Some left-sided abdominal pain.   Subjective:    Patient ID: [name], male    DOB: June 04, 1956, 61 y.o.   MRN: [id number]  HPI  [name] is 61 year old with HIV/AIDS, prior cryptococcal meningitis with IRIS who had flare of viremia up to nearly 2 millon then l controlled on Tivicay and Truvada but then lost to followup. He claims that Descovy made him sick when we tried to go with this so went back to Tivicay and Truvada.  His VL has  NOT been  perfectly controlled but slightly better control recently. He claims to be taking his TIVICAY and Truvada religiously.  Lab Results  Component Value Date   HIV1RNAQUANT 25 (H) 05/03/2016   HIV1RNAQUANT 390 (H) 04/19/2016   HIV1RNAQUANT 37 (H) 02/15/2015   Lab Results  Component Value Date   CD4TABS 450 07/19/2016   CD4TABS 340 (L) 04/19/2016   CD4TABS 270 (L) 02/15/2015     He is interested in being treated for his chronic hepatitis C without hepatic coma. He says he has cut down on his alcohol consumption. I emphasized him know that he needs to stop it altogether to be up to qualify for Medicaid and also the long-term health of his liver.  He felt confident that he could abstain from alcohol altogether.  Also emphasized that he was need to make his appointments religiously to pick up the hepatitis C medicines and to follow-up with pharmacy. No today he was an hour late to see me which is not unusual for him.   He's been having some abdominal pain and epigastric pain which he took his having stopped his PPI which he can now resume.  Past Medical History:  Diagnosis Date  . Acute kidney failure    unspec, secondary to medications  . AIDS ([place]) 06/07/2016  . Alcohol abuse 05/03/2016  . anal ca dx'd 02/2011  . Cachexia ([place])   . 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 Drug abuse   . Hemorrhoid   . Hepatitis C   . History of radiation therapy 01/01/2011 -05/18/11   anal, pelvis, inguinal lymph nodes  . HIV or AIDS   .  Hypertension   . Loose stools 06/07/2016  . Mass of anus 01/26/2011   squamous cell cancer  . Meningitis   . Mood disorder ([place])   . Pain    right leg  . Pulmonary nodule   . Rash and nonspecific skin eruption 03/31/2015  . Testicular hypofunction     Past Surgical History:  Procedure Laterality Date  . MASS EXCISION  01/26/2011   squamous cell carcinoma    No family history on file.    Social History   Social History  . Marital status: Single    Spouse name: N/A  . Number of children: N/A  . Years of education: N/A   Social History Main Topics  . Smoking status: Current Every Day Smoker    Packs/day: 0.25    Types: Cigarettes  . Smokeless tobacco: Never Used     Comment: 2-3 day  . Alcohol use 1.2 - 1.8 oz/week    2 - 3 Cans of beer per week  . Drug use: No  . Sexual activity: Not Currently    Partners: Male    Birth control/ protection: Condom     Comment: given condoms   Other Topics Concern  . None   Social History Narrative  . None    No Known Allergies  Current Outpatient Prescriptions:  .  emtricitabine-tenofovir (TRUVADA) 200-300 MG tablet, Take 1 tablet by mouth daily., Disp: 30 tablet, Rfl: 11 .  TIVICAY 50 MG tablet, TAKE 1 TABLET (50 MG TOTAL) BY MOUTH DAILY., Disp: 30 tablet, Rfl: 5 .  omeprazole (PRILOSEC) 40 MG capsule, TAKE 1 CAPSULE (40 MG TOTAL) BY MOUTH DAILY. (Patient not taking: Reported on 08/09/2016), Disp: 30 capsule, Rfl: 5 .  triamcinolone ointment (KENALOG) 0.5 %, APPLY 1 APPLICATION TOPICALLY TWO   (TWO) TIMES DAILY. (Patient not taking: Reported on 08/09/2016), Disp: 30 g, Rfl: 5   Review of Systems  Constitutional: Negative for activity change, appetite change, chills, diaphoresis, fatigue, fever and unexpected weight change.  HENT: Negative for congestion, rhinorrhea, sinus pressure, sneezing, sore throat and trouble swallowing.   Eyes: Negative for photophobia and visual disturbance.  Respiratory: Negative for cough, chest  tightness, shortness of breath, wheezing and stridor.   Cardiovascular: Negative for chest pain, palpitations and leg swelling.  Gastrointestinal: Positive for abdominal pain and diarrhea. Negative for abdominal distention, anal bleeding, blood in stool, constipation, nausea and vomiting.  Genitourinary: Negative for difficulty urinating, dysuria, flank pain and hematuria.  Musculoskeletal: Negative for arthralgias, back pain, gait problem, joint swelling and myalgias.  Skin: Negative for color change, pallor and wound.  Neurological: Negative for dizziness, tremors, weakness and light-headedness.  Hematological: Negative for adenopathy. Does not bruise/bleed easily.  Psychiatric/Behavioral: Negative for agitation, behavioral problems, confusion, decreased concentration, dysphoric mood and sleep disturbance.       Objective:   Physical Exam  Constitutional: He is oriented to person, place, and time. He appears well-developed and well-nourished.  HENT:  Head: Normocephalic and atraumatic.  Eyes: Conjunctivae and EOM are normal.  Neck: Normal range of motion. Neck supple.  Cardiovascular: Normal rate and regular rhythm.   Pulmonary/Chest: Effort normal. No respiratory distress. He has no wheezes.  Abdominal: Soft. He exhibits no distension. There is no rebound.  Musculoskeletal: Normal range of motion. He exhibits no edema or tenderness.  Neurological: He is alert and oriented to person, place, and time.  Skin: Skin is warm and dry. No erythema. No pallor.  Psychiatric: He has a normal mood and affect. Cognition and memory are normal.          Assessment & Plan:   HIV: continue Tivicay and Truvada and Return to clinic in 2 months time  Etoh use: As above he is going to need to start abstain completely.  Chronic hepatitis C without hepatic coma. HCV Fibrosure F3 No results found for: [facility] Lab Results  Component Value Date   HCVGENOTYPE 1a 08/26/2014   Hepatitis C RNA  quantitative Latest Ref Rng & Units 05/03/2016 08/26/2014 08/27/2008 10/01/2006  HCV Quantitative <15 IU/mL 641,599(H) 1,297,990(H) 8520000(H) 4,750,000  HCV Quantitative Log <1.18 log 10 5.81(H) 6.11(H) 6.93 (NOTE)  This test utilizes the [place] FDA approved Roche HCV Test Kit by RT-PCR.(H) -   He is to be seen by [facility] in 2 weeks' time. Hopefully he has been absent from alcohol throughout that period of time and we can initiate the process of getting him medications to cure his hep C is going to need a follow-up religiously with men to pick up his medications and for adherence purposes.  GERD can resume PPI   We  spent greater than 25  minutes with the patient including greater than 50% of time in face to face counsel of the patient re his HIV, his HCV, his etoh use,  his GERDand in  coordination of his care.

## 2016-08-23 ENCOUNTER — Ambulatory Visit: Payer: Medicaid Other

## 2016-09-04 ENCOUNTER — Telehealth: Payer: Self-pay | Admitting: Pharmacist Clinician (PhC)/ Clinical Pharmacy Specialist

## 2016-09-04 NOTE — Telephone Encounter (Signed)
Thanks Minh! 

## 2016-09-04 NOTE — Telephone Encounter (Signed)
We were supposed to see him last week to see if we need to deal with hep C but the clinic was closed. Tried to call again to bring him back. Had to leave a voicemail.

## 2016-09-05 ENCOUNTER — Telehealth: Payer: Self-pay | Admitting: Pharmacist Clinician (PhC)/ Clinical Pharmacy Specialist

## 2016-09-05 NOTE — Telephone Encounter (Signed)
VM to call back to set up appt

## 2016-09-06 ENCOUNTER — Other Ambulatory Visit: Payer: Self-pay | Admitting: Infectious Disease

## 2016-11-01 ENCOUNTER — Other Ambulatory Visit: Payer: Self-pay | Admitting: Infectious Diseases

## 2016-11-01 DIAGNOSIS — R21 Rash and other nonspecific skin eruption: Secondary | ICD-10-CM

## 2016-11-01 DIAGNOSIS — B20 Human immunodeficiency virus [HIV] disease: Secondary | ICD-10-CM

## 2016-11-02 ENCOUNTER — Other Ambulatory Visit: Payer: Self-pay | Admitting: *Deleted

## 2016-11-02 DIAGNOSIS — B20 Human immunodeficiency virus [HIV] disease: Secondary | ICD-10-CM

## 2016-11-02 DIAGNOSIS — R21 Rash and other nonspecific skin eruption: Secondary | ICD-10-CM

## 2016-11-02 MED ORDER — EMTRICITABINE-TENOFOVIR DF 200-300 MG PO TABS: 1.0000 | ORAL_TABLET | Freq: Every day | ORAL | 5 refills | Status: DC

## 2016-11-02 MED ORDER — DOLUTEGRAVIR SODIUM 50 MG PO TABS: ORAL_TABLET | ORAL | 5 refills | Status: DC

## 2016-11-02 MED ORDER — TRIAMCINOLONE ACETONIDE 0.5 % EX OINT: TOPICAL_OINTMENT | CUTANEOUS | 1 refills | Status: DC

## 2016-11-20 ENCOUNTER — Ambulatory Visit: Payer: Medicaid Other | Admitting: Infectious Disease

## 2017-03-26 ENCOUNTER — Other Ambulatory Visit: Payer: Self-pay | Admitting: Infectious Disease

## 2017-03-26 DIAGNOSIS — R21 Rash and other nonspecific skin eruption: Secondary | ICD-10-CM

## 2017-05-07 ENCOUNTER — Other Ambulatory Visit: Payer: Self-pay | Admitting: *Deleted

## 2017-05-07 ENCOUNTER — Telehealth: Payer: Self-pay | Admitting: *Deleted

## 2017-05-07 ENCOUNTER — Other Ambulatory Visit: Payer: Self-pay

## 2017-05-07 ENCOUNTER — Ambulatory Visit (INDEPENDENT_AMBULATORY_CARE_PROVIDER_SITE_OTHER): Payer: Self-pay

## 2017-05-07 DIAGNOSIS — Z23 Encounter for immunization: Secondary | ICD-10-CM

## 2017-05-07 DIAGNOSIS — B20 Human immunodeficiency virus [HIV] disease: Secondary | ICD-10-CM

## 2017-05-07 MED ORDER — DOLUTEGRAVIR SODIUM 50 MG PO TABS: ORAL_TABLET | ORAL | 5 refills | Status: DC

## 2017-05-07 MED ORDER — EMTRICITABINE-TENOFOVIR DF 200-300 MG PO TABS: 1.0000 | ORAL_TABLET | Freq: Every day | ORAL | 5 refills | Status: DC

## 2017-05-07 NOTE — Telephone Encounter (Signed)
Patient walked into clinic stating he has been out of his hiv meds x one week. His Medicaid is no longer active and today he applied for ADAP and [employer]. He still needs to bring in more paperwork for ADAP. Caryl Pina was able to complete the [employer]. MD appt on 05/30/17; patient did not want to meet with pharmacy before then.

## 2017-05-21 ENCOUNTER — Ambulatory Visit: Payer: Self-pay

## 2017-05-30 ENCOUNTER — Ambulatory Visit: Payer: Self-pay | Admitting: Infectious Disease

## 2017-07-23 ENCOUNTER — Ambulatory Visit (INDEPENDENT_AMBULATORY_CARE_PROVIDER_SITE_OTHER): Payer: Self-pay | Admitting: Pharmacist Clinician (PhC)/ Clinical Pharmacy Specialist

## 2017-07-23 DIAGNOSIS — B20 Human immunodeficiency virus [HIV] disease: Secondary | ICD-10-CM

## 2017-07-23 MED ORDER — DARUN-COBIC-EMTRICIT-TENOFAF 800-150-200-10 MG PO TABS: 1.0000 | ORAL_TABLET | Freq: Every day | ORAL | 3 refills | Status: DC

## 2017-07-23 NOTE — Addendum Note (Signed)
Addended by: Dolan Amen D on: 07/23/2017 03:58 PM   Modules accepted: Orders

## 2017-07-23 NOTE — Progress Notes (Signed)
HPI: ALANMICHAEL BARMORE is a 61 y.o. male who was brought here by [place] today for his HIV visit.   Allergies: No Known Allergies  Vitals:    Past Medical History: Past Medical History:  Diagnosis Date  . Acute kidney failure    unspec, secondary to medications  . AIDS ([place]) 06/07/2016  . Alcohol abuse 05/03/2016  . anal ca dx'd 02/2011  . Cachexia ([place])   . 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 Drug abuse   . GERD (gastroesophageal reflux disease) 08/09/2016  . Hemorrhoid   . Hepatitis C   . History of radiation therapy 01/01/2011 -05/18/11   anal, pelvis, inguinal lymph nodes  . HIV or AIDS   . Hypertension   . Loose stools 06/07/2016  . Mass of anus 01/26/2011   squamous cell cancer  . Meningitis   . Mood disorder ([place])   . Pain    right leg  . Pulmonary nodule   . Rash and nonspecific skin eruption 03/31/2015  . Testicular hypofunction     Social History: Social History   Socioeconomic History  . Marital status: Single    Spouse name: Not on file  . Number of children: Not on file  . Years of education: Not on file  . Highest education level: Not on file  Social Needs  . Financial resource strain: Not on file  . Food insecurity - worry: Not on file  . Food insecurity - inability: Not on file  . Transportation needs - medical: Not on file  . Transportation needs - non-medical: Not on file  Occupational History  . Not on file  Tobacco Use  . Smoking status: Current Every Day Smoker    Packs/day: 0.25    Types: Cigarettes  . Smokeless tobacco: Never Used  . Tobacco comment: 2-3 day  Substance and Sexual Activity  . Alcohol use: Yes    Alcohol/week: 1.2 - 1.8 oz    Types: 2 - 3 Cans of beer per week  . Drug use: No  . Sexual activity: Not Currently    Partners: Male    Birth control/protection: Condom    Comment: given condoms  Other Topics Concern  . Not on file  Social History Narrative  . Not on file    Previous Regimen: DTG/Descovy, DRV/r/TRV  Current  Regimen: Off  Labs: HIV 1 RNA Quant (copies/mL)  Date Value  05/03/2016 25 (H)  04/19/2016 390 (H)  02/15/2015 37 (H)   CD4 T Cell Abs (/uL)  Date Value  07/19/2016 450  04/19/2016 340 (L)  02/15/2015 270 (L)   Hep B S Ab (no units)  Date Value  10/01/2006 NO   Hepatitis B Surface Ag (no units)  Date Value  10/01/2006 NO   HCV Ab (no units)  Date Value  10/01/2006 YES    CrCl: CrCl cannot be calculated (Patient's most recent lab result is older than the maximum 21 days allowed.).  Lipids:    Component Value Date/Time   CHOL 130 07/22/2014 1128   TRIG 396 (H) 07/22/2014 1128   HDL 45 07/22/2014 1128   CHOLHDL 2.9 07/22/2014 1128   VLDL 79 (H) 07/22/2014 1128   LDLCALC 6 07/22/2014 1128    Assessment: Dayten's roommate was supposed to be here to see me today. However, [name] was able to bring his roommate instead. Jailyn said that "[name]" is in very bad shape and he just doesn't care. 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 Lonnel has been  off of meds also. His adherence has been horrible. He will sign up with [facility] service now so we can bring him back more consistently. I stressed it to him repeatedly about how important it was to f/u with [place] for his care.   We are going get all labs today and start him back on Symtuza instead. Apparently, he has medicaid now. He wants it sent to [place] so they can deliver it to him. We will see him again in about 5 wks to repeat labs, then we will set up a f/u with Dr. [name].   Recommendations:  HIV labs today Start Symtuza 1 daily sent to [facility] -  F/u in 5 wks  [name], PharmD, BCPS, AAHIVP, [place] for Infectious Disease 07/23/2017, 2:36 PM

## 2017-07-24 LAB — COMPLETE METABOLIC PANEL WITH GFR
AG Ratio: 0.6 (calc) — ABNORMAL LOW (ref 1.0–2.5)
ALT: 11 U/L (ref 9–46)
AST: 20 U/L (ref 10–35)
Albumin: 3 g/dL — ABNORMAL LOW (ref 3.6–5.1)
Alkaline phosphatase (APISO): 57 U/L (ref 40–115)
BUN: 10 mg/dL (ref 7–25)
CO2: 24 mmol/L (ref 20–32)
Calcium: 8.5 mg/dL — ABNORMAL LOW (ref 8.6–10.3)
Chloride: 99 mmol/L (ref 98–110)
Creat: 1.03 mg/dL (ref 0.70–1.25)
GFR, Est African American: 90 mL/min/{1.73_m2} (ref >=60)
GFR, Est Non African American: 77 mL/min/{1.73_m2} (ref >=60)
Globulin: 5.1 g/dL (calc) — ABNORMAL HIGH (ref 1.9–3.7)
Glucose, Bld: 148 mg/dL — ABNORMAL HIGH (ref 65–99)
Potassium: 3.7 mmol/L (ref 3.5–5.3)
Sodium: 131 mmol/L — ABNORMAL LOW (ref 135–146)
Total Bilirubin: 0.5 mg/dL (ref 0.2–1.2)
Total Protein: 8.1 g/dL (ref 6.1–8.1)

## 2017-07-24 LAB — T-HELPER CELL (CD4) - (RCID CLINIC ONLY)
CD4 % Helper T Cell: 13 % — ABNORMAL LOW (ref 33–55)
CD4 T Cell Abs: 160 /uL — ABNORMAL LOW (ref 400–2700)

## 2017-07-24 LAB — LIPID PANEL
Cholesterol: 91 mg/dL (ref <200)
HDL: 42 mg/dL (ref >40)
LDL Cholesterol (Calc): 33 mg/dL (calc)
Non-HDL Cholesterol (Calc): 49 mg/dL (calc) (ref <130)
Total CHOL/HDL Ratio: 2.2 (calc) (ref <5.0)
Triglycerides: 78 mg/dL (ref <150)

## 2017-07-24 LAB — RPR: RPR Ser Ql: NONREACTIVE

## 2017-08-08 LAB — HIV-1 GENOTYPE: HIV-1 Genotype: DETECTED — AB

## 2017-08-08 LAB — HIV RNA, RTPCR W/R GT (RTI, PI,INT)
HIV 1 RNA Quant: 68400 {copies}/mL — ABNORMAL HIGH
HIV-1 RNA Quant, Log: 4.84 {Log_copies}/mL — ABNORMAL HIGH

## 2017-08-08 LAB — HIV-1 INTEGRASE GENOTYPE

## 2017-08-22 ENCOUNTER — Telehealth: Payer: Self-pay | Admitting: Pharmacist Clinician (PhC)/ Clinical Pharmacy Specialist

## 2017-08-22 ENCOUNTER — Telehealth: Payer: Self-pay | Admitting: Pharmacy Technician

## 2017-08-22 NOTE — Telephone Encounter (Signed)
Thx Minh!

## 2017-08-22 NOTE — Telephone Encounter (Signed)
Mitch call to say that Lennix still haven't gotten the meds yet. Apparently, he has a new number now. [employer] prob can't contact him to deliver the [employer]. Called Sam at [facility] to give him the new number so they can deliver.

## 2017-08-29 ENCOUNTER — Ambulatory Visit: Payer: Self-pay | Admitting: Pharmacist Clinician (PhC)/ Clinical Pharmacy Specialist

## 2017-08-29 MED ORDER — DARUN-COBIC-EMTRICIT-TENOFAF 800-150-200-10 MG PO TABS: 1.0000 | ORAL_TABLET | Freq: Every day | ORAL | 3 refills | Status: DC

## 2017-08-29 MED FILL — SYMTUZA 800-150-200-10 MG T: 800-150-200 | 30 days supply | Qty: 30 | Fill #0

## 2017-08-29 NOTE — Progress Notes (Signed)
[name] was supposed to meet with pharmacy today after restarting his ART. However, he doesn't have any coverage at all so he has no access to meds. [name] has to scramble to figure out a way to get him coverage. He will not be qualify for Medicaid or Medicare, therefore, [name] will have to do ADAP for him. For now we are going to use the 30d symtuza card to get him the 30d and he sign the patient assistance form today. Unfortunately, when his ADAP is approved we'll have to split up the [place] again.

## 2017-09-05 ENCOUNTER — Other Ambulatory Visit: Payer: Self-pay | Admitting: Pharmacist Clinician (PhC)/ Clinical Pharmacy Specialist

## 2017-09-05 DIAGNOSIS — K219 Gastro-esophageal reflux disease without esophagitis: Secondary | ICD-10-CM

## 2017-09-05 MED ORDER — SULFAMETHOXAZOLE-TRIMETHOPRIM 400-80 MG PO TABS: 1.0000 | ORAL_TABLET | Freq: Every day | ORAL | 5 refills | Status: DC

## 2017-09-05 MED ORDER — OMEPRAZOLE 40 MG PO CPDR: DELAYED_RELEASE_CAPSULE | ORAL | 5 refills | Status: DC

## 2017-09-05 MED FILL — SULFAMETHOXAZOLE-TMP SS TAB: 400-80 | 30 days supply | Qty: 30 | Fill #0

## 2017-09-05 MED FILL — OMEPRAZOLE DR 40 MG CAPSULE: 40 | 30 days supply | Qty: 30 | Fill #0

## 2017-09-05 NOTE — Progress Notes (Unsigned)
Has medicaid now. Forwarding his meds to [place].

## 2017-09-27 ENCOUNTER — Encounter: Payer: Self-pay | Admitting: Infectious Disease

## 2017-10-17 ENCOUNTER — Other Ambulatory Visit: Payer: Self-pay | Admitting: Pharmacist Clinician (PhC)/ Clinical Pharmacy Specialist

## 2017-10-17 MED ORDER — SULFAMETHOXAZOLE-TRIMETHOPRIM 800-160 MG PO TABS: 1.0000 | ORAL_TABLET | Freq: Every day | ORAL | 5 refills | Status: DC

## 2017-10-17 MED FILL — SULFAMETHOXAZOLE-TMP DS TAB: 800-160 | 30 days supply | Qty: 30 | Fill #0

## 2017-10-17 MED FILL — SYMTUZA 800-150-200-10 MG T: 800-150-200 | 30 days supply | Qty: 30 | Fill #1

## 2017-10-17 MED FILL — OMEPRAZOLE DR 40 MG CAPSULE: 40 | 30 days supply | Qty: 30 | Fill #1

## 2017-10-22 ENCOUNTER — Ambulatory Visit (INDEPENDENT_AMBULATORY_CARE_PROVIDER_SITE_OTHER): Payer: Self-pay | Admitting: Pharmacist Clinician (PhC)/ Clinical Pharmacy Specialist

## 2017-10-22 DIAGNOSIS — B20 Human immunodeficiency virus [HIV] disease: Secondary | ICD-10-CM

## 2017-10-22 DIAGNOSIS — R21 Rash and other nonspecific skin eruption: Secondary | ICD-10-CM

## 2017-10-22 MED ORDER — TRIAMCINOLONE ACETONIDE 0.5 % EX OINT: TOPICAL_OINTMENT | CUTANEOUS | 1 refills | Status: DC

## 2017-10-22 MED ORDER — DARUN-COBIC-EMTRICIT-TENOFAF 800-150-200-10 MG PO TABS: 1.0000 | ORAL_TABLET | Freq: Every day | ORAL | 5 refills | Status: DC

## 2017-10-22 NOTE — Progress Notes (Signed)
HPI: [name] is a 62 y.o. male who is here to see pharmacy for his adherence visit.   Allergies: No Known Allergies  Vitals:    Past Medical History: Past Medical History:  Diagnosis Date  . Acute kidney failure    unspec, secondary to medications  . AIDS ([place]) 06/07/2016  . Alcohol abuse 05/03/2016  . anal ca dx'd 02/2011  . Cachexia ([place])   . 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 Drug abuse   . GERD (gastroesophageal reflux disease) 08/09/2016  . Hemorrhoid   . Hepatitis C   . History of radiation therapy 01/01/2011 -05/18/11   anal, pelvis, inguinal lymph nodes  . HIV or AIDS   . Hypertension   . Loose stools 06/07/2016  . Mass of anus 01/26/2011   squamous cell cancer  . Meningitis   . Mood disorder ([place])   . Pain    right leg  . Pulmonary nodule   . Rash and nonspecific skin eruption 03/31/2015  . Testicular hypofunction     Social History: Social History   Socioeconomic History  . Marital status: Single    Spouse name: Not on file  . Number of children: Not on file  . Years of education: Not on file  . Highest education level: Not on file  Social Needs  . Financial resource strain: Not on file  . Food insecurity - worry: Not on file  . Food insecurity - inability: Not on file  . Transportation needs - medical: Not on file  . Transportation needs - non-medical: Not on file  Occupational History  . Not on file  Tobacco Use  . Smoking status: Current Every Day Smoker    Packs/day: 0.25    Types: Cigarettes  . Smokeless tobacco: Never Used  . Tobacco comment: 2-3 day  Substance and Sexual Activity  . Alcohol use: Yes    Alcohol/week: 1.2 - 1.8 oz    Types: 2 - 3 Cans of beer per week  . Drug use: No  . Sexual activity: Not Currently    Partners: Male    Birth control/protection: Condom    Comment: given condoms  Other Topics Concern  . Not on file  Social History Narrative  . Not on file    Previous Regimen: DTG/Descovy, DRV/r/TRV  Current  Regimen: Symtuza  Labs: HIV 1 RNA Quant (copies/mL)  Date Value  07/23/2017 68,400 (H)  05/03/2016 25 (H)  04/19/2016 390 (H)   CD4 T Cell Abs (/uL)  Date Value  07/23/2017 160 (L)  07/19/2016 450  04/19/2016 340 (L)   Hep B S Ab (no units)  Date Value  10/01/2006 NO   Hepatitis B Surface Ag (no units)  Date Value  10/01/2006 NO   HCV Ab (no units)  Date Value  10/01/2006 YES    CrCl: CrCl cannot be calculated (Patient's most recent lab result is older than the maximum 21 days allowed.).  Lipids:    Component Value Date/Time   CHOL 91 07/23/2017 1513   TRIG 78 07/23/2017 1513   HDL 42 07/23/2017 1513   CHOLHDL 2.2 07/23/2017 1513   VLDL 79 (H) 07/22/2014 1128   LDLCALC 33 07/23/2017 1513        Assessment: [name] is here today for his HIV visit with pharmacy. It's really is a miracle that he shows up for the visit. He pointed out the correct drug for his HIV. He stated that he is on septra and "stomach acid" pill. Apparently, he  is doing better with adherence. He has been working with [employer].   He stated about the rash he has on his arms and shoulders. We will put him back on some triamcinolone today. Took several photos of the rash.   We will get all labs today and bring him back to see Dr [name] for a f/u next month since he hasn't seen him since Jan of 2018.   He also has untreated hep C. Will get a CMP and VL again today. We should not start treatment until he is adherence to his HIV meds.   Recommendations:  Continue Symtuza 1 PO qday with meal Continue Septra 1 PO qday HIV VL, CD4, CMP, hep C VL F/u with Dr. [name] next month  [name], PharmD, BCPS, AAHIVP, CPP Clinical Infectious Disease Pharmacist [place] for Infectious Disease 10/22/2017, 3:17 PM

## 2017-10-23 LAB — COMPLETE METABOLIC PANEL WITH GFR
AG Ratio: 0.7 (calc) — ABNORMAL LOW (ref 1.0–2.5)
ALT: 15 U/L (ref 9–46)
AST: 34 U/L (ref 10–35)
Albumin: 3.7 g/dL (ref 3.6–5.1)
Alkaline phosphatase (APISO): 60 U/L (ref 40–115)
BUN: 13 mg/dL (ref 7–25)
CO2: 26 mmol/L (ref 20–32)
Calcium: 9.4 mg/dL (ref 8.6–10.3)
Chloride: 103 mmol/L (ref 98–110)
Creat: 0.97 mg/dL (ref 0.70–1.25)
GFR, Est African American: 96 mL/min/{1.73_m2} (ref >=60)
GFR, Est Non African American: 83 mL/min/{1.73_m2} (ref >=60)
Globulin: 5 g/dL (calc) — ABNORMAL HIGH (ref 1.9–3.7)
Glucose, Bld: 99 mg/dL (ref 65–99)
Potassium: 4.6 mmol/L (ref 3.5–5.3)
Sodium: 136 mmol/L (ref 135–146)
Total Bilirubin: 0.5 mg/dL (ref 0.2–1.2)
Total Protein: 8.7 g/dL — ABNORMAL HIGH (ref 6.1–8.1)

## 2017-10-23 LAB — RPR: RPR Ser Ql: NONREACTIVE

## 2017-10-24 LAB — T-HELPER CELL (CD4) - (RCID CLINIC ONLY)
CD4 % Helper T Cell: 11 % — ABNORMAL LOW (ref 33–55)
CD4 T Cell Abs: 160 /uL — ABNORMAL LOW (ref 400–2700)

## 2017-10-24 LAB — HEPATITIS C RNA QUANTITATIVE
HCV Quantitative Log: 6.32 Log IU/mL — ABNORMAL HIGH
HCV RNA, PCR, QN: 2070000 IU/mL — ABNORMAL HIGH

## 2017-11-05 LAB — HIV RNA, RTPCR W/R GT (RTI, PI,INT)
HIV 1 RNA Quant: 185 copies/mL — ABNORMAL HIGH
HIV-1 RNA Quant, Log: 2.27 Log copies/mL — ABNORMAL HIGH

## 2017-11-14 MED FILL — SYMTUZA 800-150-200-10 MG T: 800-150-200 | 30 days supply | Qty: 30 | Fill #2

## 2017-11-14 MED FILL — SULFAMETHOXAZOLE-TMP DS TAB: 800-160 | 30 days supply | Qty: 30 | Fill #1

## 2017-11-14 MED FILL — TRIAMCINOLONE 0.5% OINTMENT: 0.5 | 15 days supply | Qty: 30 | Fill #0

## 2017-11-14 MED FILL — OMEPRAZOLE DR 40 MG CAPSULE: 40 | 30 days supply | Qty: 30 | Fill #2

## 2017-11-29 ENCOUNTER — Encounter: Payer: Self-pay | Admitting: Infectious Disease

## 2017-11-29 ENCOUNTER — Ambulatory Visit (INDEPENDENT_AMBULATORY_CARE_PROVIDER_SITE_OTHER): Payer: Medicaid Other | Admitting: Infectious Disease

## 2017-11-29 VITALS — BP 125/78 | HR 91 | Temp 98.5°F | Ht 70.0 in | Wt 133.0 lb

## 2017-11-29 DIAGNOSIS — F4321 Adjustment disorder with depressed mood: Secondary | ICD-10-CM

## 2017-11-29 DIAGNOSIS — B182 Chronic viral hepatitis C: Secondary | ICD-10-CM

## 2017-11-29 DIAGNOSIS — B1920 Unspecified viral hepatitis C without hepatic coma: Secondary | ICD-10-CM | POA: Insufficient documentation

## 2017-11-29 DIAGNOSIS — B20 Human immunodeficiency virus [HIV] disease: Secondary | ICD-10-CM | POA: Diagnosis not present

## 2017-11-29 DIAGNOSIS — R21 Rash and other nonspecific skin eruption: Secondary | ICD-10-CM | POA: Diagnosis not present

## 2017-11-29 HISTORY — DX: Adjustment disorder with depressed mood: F43.21

## 2017-11-29 LAB — CBC WITH DIFFERENTIAL/PLATELET
Basophils Absolute: 51 cells/uL (ref 0–200)
Basophils Relative: 1.7 %
Eosinophils Absolute: 150 cells/uL (ref 15–500)
Eosinophils Relative: 5 %
HCT: 45.2 % (ref 38.5–50.0)
Hemoglobin: 14.6 g/dL (ref 13.2–17.1)
Lymphs Abs: 1068 cells/uL (ref 850–3900)
MCH: 28.1 pg (ref 27.0–33.0)
MCHC: 32.3 g/dL (ref 32.0–36.0)
MCV: 86.9 fL (ref 80.0–100.0)
MPV: 8.7 fL (ref 7.5–12.5)
Monocytes Relative: 19.8 %
Neutro Abs: 1137 cells/uL — ABNORMAL LOW (ref 1500–7800)
Neutrophils Relative %: 37.9 %
Platelets: 258 10*3/uL (ref 140–400)
RBC: 5.2 10*6/uL (ref 4.20–5.80)
RDW: 14.6 % (ref 11.0–15.0)
Total Lymphocyte: 35.6 %
WBC mixed population: 594 cells/uL (ref 200–950)
WBC: 3 10*3/uL — ABNORMAL LOW (ref 3.8–10.8)

## 2017-11-29 LAB — COMPLETE METABOLIC PANEL WITH GFR
AG Ratio: 0.8 (calc) — ABNORMAL LOW (ref 1.0–2.5)
ALT: 20 U/L (ref 9–46)
AST: 47 U/L — ABNORMAL HIGH (ref 10–35)
Albumin: 3.7 g/dL (ref 3.6–5.1)
Alkaline phosphatase (APISO): 65 U/L (ref 40–115)
BUN: 11 mg/dL (ref 7–25)
CO2: 28 mmol/L (ref 20–32)
Calcium: 9 mg/dL (ref 8.6–10.3)
Chloride: 103 mmol/L (ref 98–110)
Creat: 1.21 mg/dL (ref 0.70–1.25)
GFR, Est African American: 73 mL/min/{1.73_m2} (ref >=60)
GFR, Est Non African American: 63 mL/min/{1.73_m2} (ref >=60)
Globulin: 4.5 g/dL (calc) — ABNORMAL HIGH (ref 1.9–3.7)
Glucose, Bld: 87 mg/dL (ref 65–99)
Potassium: 4.5 mmol/L (ref 3.5–5.3)
Sodium: 138 mmol/L (ref 135–146)
Total Bilirubin: 0.3 mg/dL (ref 0.2–1.2)
Total Protein: 8.2 g/dL — ABNORMAL HIGH (ref 6.1–8.1)

## 2017-11-29 NOTE — Progress Notes (Signed)
Chief complaint: " Why are you late?"  Also complaining of a rash on his neck and legs and arms  Subjective:    Patient ID: [name], male    DOB: May 15, 1956, 62 y.o.   MRN: [id number]  HPI  [name] is 62 year old with HIV/AIDS, prior cryptococcal meningitis with IRIS who has intermittently been adherent to antiretroviral medications.  [name] recently was started on [facility] and claims to be taking it reliably.  His viral load did indeed come down from 68,400 copies to 185 copies.  CD4 count is still at 160.     Lab Results  Component Value Date   HIV1RNAQUANT 185 (H) 10/22/2017   HIV1RNAQUANT 68,400 (H) 07/23/2017   HIV1RNAQUANT 25 (H) 05/03/2016   Lab Results  Component Value Date   CD4TABS 160 (L) 10/22/2017   CD4TABS 160 (L) 07/23/2017   CD4TABS 450 07/19/2016   [name] does complain of a rash on his neck arms and legs.  According to med she brought him to clinic today the patient is exposed to all sorts of bugs and insects due to poor sanitation in his living conditions.  [name] has just suffered the loss of his best friend [name] who is also my patient who suffered from HIV AIDS and died from cryptococcal meningitis in the last few weeks.  I gave [name] a hug and talk to him at length about his loss and grieving.  [name] did not want to see a counselor today and says [name] is talking to a woman who is a friend of his.   Past Medical History:  Diagnosis Date  . Acute kidney failure    unspec, secondary to medications  . AIDS ([place]) 06/07/2016  . Alcohol abuse 05/03/2016  . anal ca dx'd 02/2011  . Cachexia ([place])   . 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 Drug abuse ([place])   . GERD (gastroesophageal reflux disease) 08/09/2016  . Hemorrhoid   . Hepatitis C   . History of radiation therapy 01/01/2011 -05/18/11   anal, pelvis, inguinal lymph nodes  . HIV or AIDS   . Hypertension   . Loose stools 06/07/2016  . Mass of anus 01/26/2011   squamous cell cancer  . Meningitis   . Mood disorder ([place])   .  Pain    right leg  . Pulmonary nodule   . Rash and nonspecific skin eruption 03/31/2015  . Testicular hypofunction     Past Surgical History:  Procedure Laterality Date  . MASS EXCISION  01/26/2011   squamous cell carcinoma    No family history on file.    Social History   Socioeconomic History  . Marital status: Single    Spouse name: Not on file  . Number of children: Not on file  . Years of education: Not on file  . Highest education level: Not on file  Occupational History  . Not on file  Social Needs  . Financial resource strain: Not on file  . Food insecurity:    Worry: Not on file    Inability: Not on file  . Transportation needs:    Medical: Not on file    Non-medical: Not on file  Tobacco Use  . Smoking status: Current Every Day Smoker    Packs/day: 0.25    Types: Cigarettes  . Smokeless tobacco: Never Used  . Tobacco comment: 2-3 day  Substance and Sexual Activity  . Alcohol use: Yes    Alcohol/week: 1.2 - 1.8 oz  Types: 2 - 3 Cans of beer per week  . Drug use: No  . Sexual activity: Not Currently    Partners: Male    Birth control/protection: Condom    Comment: given condoms  Lifestyle  . Physical activity:    Days per week: Not on file    Minutes per session: Not on file  . Stress: Not on file  Relationships  . Social connections:    Talks on phone: Not on file    Gets together: Not on file    Attends religious service: Not on file    Active member of club or organization: Not on file    Attends meetings of clubs or organizations: Not on file    Relationship status: Not on file  Other Topics Concern  . Not on file  Social History Narrative  . Not on file    No Known Allergies   Current Outpatient Medications:  .  Darunavir-Cobicisctat-Emtricitabine-Tenofovir Alafenamide (SYMTUZA) 800-150-200-10 MG TABS, Take 1 tablet by mouth daily with breakfast., Disp: 30 tablet, Rfl: 5 .  omeprazole (PRILOSEC) 40 MG capsule, TAKE 1 CAPSULE (40  MG TOTAL) BY MOUTH DAILY., Disp: 30 capsule, Rfl: 5 .  sulfamethoxazole-trimethoprim (BACTRIM DS,SEPTRA DS) 800-160 MG tablet, Take 1 tablet by mouth daily., Disp: 30 tablet, Rfl: 5 .  triamcinolone ointment (KENALOG) 0.5 %, APPLY 1 APPLICATION TOPICALLY TWO TIMES DAILY., Disp: 30 g, Rfl: 1   Review of Systems  Constitutional: Negative for activity change, appetite change, chills, diaphoresis, fatigue, fever and unexpected weight change.  HENT: Negative for congestion, rhinorrhea, sinus pressure, sneezing, sore throat and trouble swallowing.   Eyes: Negative for photophobia and visual disturbance.  Respiratory: Negative for cough, chest tightness, shortness of breath, wheezing and stridor.   Cardiovascular: Negative for chest pain, palpitations and leg swelling.  Gastrointestinal: Negative for abdominal distention, anal bleeding, blood in stool, constipation, nausea and vomiting.  Genitourinary: Negative for difficulty urinating, dysuria, flank pain and hematuria.  Musculoskeletal: Negative for arthralgias, back pain, gait problem, joint swelling and myalgias.  Skin: Positive for rash. Negative for color change, pallor and wound.  Neurological: Negative for dizziness, tremors, weakness and light-headedness.  Hematological: Negative for adenopathy. Does not bruise/bleed easily.  Psychiatric/Behavioral: Negative for agitation, behavioral problems, confusion, decreased concentration, dysphoric mood and sleep disturbance.       Objective:   Physical Exam  Constitutional: [name] is oriented to person, place, and time. [name] appears well-developed and well-nourished.  HENT:  Head: Normocephalic and atraumatic.  Eyes: Conjunctivae and EOM are normal.  Neck: Normal range of motion. Neck supple.  Cardiovascular: Normal rate and regular rhythm.  Pulmonary/Chest: Effort normal. No respiratory distress. [name] has no wheezes.  Abdominal: Soft. [name] exhibits no distension. There is no rebound.  Musculoskeletal:  Normal range of motion. [name] exhibits no edema or tenderness.  Neurological: [name] is alert and oriented to person, place, and time.  Skin: Skin is warm and dry. No erythema. No pallor.  Psychiatric: Cognition and memory are normal. [name] exhibits a depressed mood.    Rash 11/29/2017:            Assessment & Plan:   HIV: Continue SYMTUZA and check labs.  Etoh use: Needs to abstain completely and [name] will be vulnerable for relapse currently with grieving of loss of his friend who is his lifelong companion.    Chronic hepatitis C without hepatic coma. HCV Fibrosure F3 Lab Results  Component Value Date   FIBROSTAGE [facility] 08/26/2014   Lab  Results  Component Value Date   HCVGENOTYPE 1a 08/26/2014   Hepatitis C RNA quantitative Latest Ref Rng & Units 10/22/2017 05/03/2016 08/26/2014 08/27/2008 10/01/2006  HCV Quantitative <15 IU/mL - 641,599(H) 1,297,990(H) 8520000(H) 4,750,000  HCV Quantitative Log NOT DETECT Log IU/mL 6.32(H) 5.81(H) 6.11(H) 6.93 (NOTE)  This test utilizes the [place] FDA approved Roche HCV Test Kit by RT-PCR.(H) -   We will start treating him when [name] is stably being followed for his HIV which is something [name] is not been doing for a long time.  GERD  PPI  Rash: This may be an HIV-associated folliculitis or due to sun exposure in his environment I do not think it is due to an adverse reaction to the darunavir.  I will check a CBC with differential which was not done before.  The rash does appear stable compared to when [place] saw it. Pt never started them similar which was prescribed for this.  We made sure [name] had a prescription now that [name] could pick up today in our clinic.   I spent greater than 25 minutes with the patient including greater than 50% of time in face to face counsel of the patient and swelling him in the loss of his lifelong partner, placing him on his adherence to his intervertebral medication and in coordination of his care with ID pharmacy and [place].

## 2017-11-30 LAB — RPR: RPR Ser Ql: NONREACTIVE

## 2017-11-30 LAB — T-HELPER CELL (CD4) - (RCID CLINIC ONLY)
CD4 % Helper T Cell: 16 % — ABNORMAL LOW (ref 33–55)
CD4 T Cell Abs: 190 /uL — ABNORMAL LOW (ref 400–2700)

## 2017-12-01 LAB — HIV-1 RNA ULTRAQUANT REFLEX TO GENTYP+
HIV 1 RNA Quant: <20 Copies/mL — ABNORMAL HIGH
HIV-1 RNA Quant, Log: <1.3 Log cps/mL — ABNORMAL HIGH

## 2017-12-14 MED FILL — SYMTUZA 800-150-200-10 MG T: 800-150-200 | 30 days supply | Qty: 30 | Fill #3

## 2017-12-14 MED FILL — TRIAMCINOLONE 0.5% OINTMENT: 0.5 | 15 days supply | Qty: 30 | Fill #1

## 2017-12-14 MED FILL — OMEPRAZOLE 40 MG CPDR: 40 | 30 days supply | Qty: 30 | Fill #3

## 2017-12-14 MED FILL — SULFAMETHOXAZOLE-TMP DS TAB: 800-160 | 30 days supply | Qty: 30 | Fill #2

## 2017-12-18 ENCOUNTER — Ambulatory Visit: Payer: Medicaid Other

## 2017-12-26 ENCOUNTER — Ambulatory Visit (INDEPENDENT_AMBULATORY_CARE_PROVIDER_SITE_OTHER): Payer: Medicaid Other | Admitting: Pharmacist Clinician (PhC)/ Clinical Pharmacy Specialist

## 2017-12-26 DIAGNOSIS — B20 Human immunodeficiency virus [HIV] disease: Secondary | ICD-10-CM

## 2017-12-26 LAB — BASIC METABOLIC PANEL
BUN: 15 mg/dL (ref 7–25)
CO2: 27 mmol/L (ref 20–32)
Calcium: 9.1 mg/dL (ref 8.6–10.3)
Chloride: 105 mmol/L (ref 98–110)
Creat: 0.97 mg/dL (ref 0.70–1.25)
Glucose, Bld: 102 mg/dL — ABNORMAL HIGH (ref 65–99)
Potassium: 4.1 mmol/L (ref 3.5–5.3)
Sodium: 139 mmol/L (ref 135–146)

## 2017-12-26 NOTE — Progress Notes (Signed)
HPI: [name] is a 62 y.o. male who is here to see pharmacy for his adherence check up.   Allergies: No Known Allergies  Vitals:    Past Medical History: Past Medical History:  Diagnosis Date  . Acute kidney failure    unspec, secondary to medications  . AIDS ([place]) 06/07/2016  . Alcohol abuse 05/03/2016  . anal ca dx'd 02/2011  . Cachexia ([place])   . 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 Drug abuse ([place])   . GERD (gastroesophageal reflux disease) 08/09/2016  . Grieving 11/29/2017  . Hemorrhoid   . Hepatitis C   . History of radiation therapy 01/01/2011 -05/18/11   anal, pelvis, inguinal lymph nodes  . HIV or AIDS   . Hypertension   . Loose stools 06/07/2016  . Mass of anus 01/26/2011   squamous cell cancer  . Meningitis   . Mood disorder ([place])   . Pain    right leg  . Pulmonary nodule   . Rash and nonspecific skin eruption 03/31/2015  . Testicular hypofunction     Social History: Social History   Socioeconomic History  . Marital status: Single    Spouse name: Not on file  . Number of children: Not on file  . Years of education: Not on file  . Highest education level: Not on file  Occupational History  . Not on file  Social Needs  . Financial resource strain: Not on file  . Food insecurity:    Worry: Not on file    Inability: Not on file  . Transportation needs:    Medical: Not on file    Non-medical: Not on file  Tobacco Use  . Smoking status: Current Every Day Smoker    Packs/day: 0.25    Types: Cigarettes  . Smokeless tobacco: Never Used  . Tobacco comment: 2-3 day  Substance and Sexual Activity  . Alcohol use: Yes    Alcohol/week: 1.2 - 1.8 oz    Types: 2 - 3 Cans of beer per week  . Drug use: No  . Sexual activity: Not Currently    Partners: Male    Birth control/protection: Condom    Comment: given condoms  Lifestyle  . Physical activity:    Days per week: Not on file    Minutes per session: Not on file  . Stress: Not on file  Relationships  . Social  connections:    Talks on phone: Not on file    Gets together: Not on file    Attends religious service: Not on file    Active member of club or organization: Not on file    Attends meetings of clubs or organizations: Not on file    Relationship status: Not on file  Other Topics Concern  . Not on file  Social History Narrative  . Not on file    Previous Regimen: DTG/Descovy, DRV/r/TRV  Current Regimen: Symtuza  Labs: HIV 1 RNA Quant  Date Value  11/29/2017 <20 Copies/mL (H)  10/22/2017 185 copies/mL (H)  07/23/2017 68,400 copies/mL (H)   CD4 T Cell Abs (/uL)  Date Value  11/29/2017 190 (L)  10/22/2017 160 (L)  07/23/2017 160 (L)   Hep B S Ab (no units)  Date Value  10/01/2006 NO   Hepatitis B Surface Ag (no units)  Date Value  10/01/2006 NO   HCV Ab (no units)  Date Value  10/01/2006 YES    CrCl: CrCl cannot be calculated (Patient's most recent lab result is older  than the maximum 21 days allowed.).  Lipids:    Component Value Date/Time   CHOL 91 07/23/2017 1513   TRIG 78 07/23/2017 1513   HDL 42 07/23/2017 1513   CHOLHDL 2.2 07/23/2017 1513   VLDL 79 (H) 07/22/2014 1128   LDLCALC 33 07/23/2017 1513    Assessment: [name] is here to see pharmacy to pick up his meds, adherence check, and get labs. Told him that we are super proud of him to become undetectable. He is doing very well on the meds. We finally got him the triamcinolone cream for his skin issue. Counseled him on how to apply it. Told him continue his Bactrim for now but his CD4 is improving. We will continue to see him here monthly for the time being until [name] can't work with him anymore.   [name] said that he should get section 8 housing very soon. He can then move out of the boarding house.   Recommendations:  Continue Symtuza 1 daily Continue Bactrim 1 daily Use triamcinolone BID F/u with pharmacy in 1 mo  [name], PharmD, BCPS, AAHIVP, CPP Clinical Infectious [place] [facility] 12/26/2017, 11:28 AM

## 2017-12-27 LAB — T-HELPER CELL (CD4) - (RCID CLINIC ONLY)
CD4 % Helper T Cell: 16 % — ABNORMAL LOW (ref 33–55)
CD4 T Cell Abs: 190 /uL — ABNORMAL LOW (ref 400–2700)

## 2017-12-28 ENCOUNTER — Other Ambulatory Visit: Payer: Self-pay | Admitting: Pharmacist Clinician (PhC)/ Clinical Pharmacy Specialist

## 2017-12-28 DIAGNOSIS — K219 Gastro-esophageal reflux disease without esophagitis: Secondary | ICD-10-CM

## 2017-12-28 LAB — HIV-1 RNA QUANT-NO REFLEX-BLD
HIV 1 RNA Quant: 21 copies/mL — ABNORMAL HIGH
HIV-1 RNA Quant, Log: 1.32 Log copies/mL — ABNORMAL HIGH

## 2017-12-28 MED ORDER — SULFAMETHOXAZOLE-TRIMETHOPRIM 800-160 MG PO TABS: 1.0000 | ORAL_TABLET | Freq: Every day | ORAL | 5 refills | Status: DC

## 2017-12-28 MED ORDER — DARUN-COBIC-EMTRICIT-TENOFAF 800-150-200-10 MG PO TABS: 1.0000 | ORAL_TABLET | Freq: Every day | ORAL | 5 refills | Status: DC

## 2017-12-28 MED ORDER — OMEPRAZOLE 40 MG PO CPDR: DELAYED_RELEASE_CAPSULE | ORAL | 5 refills | Status: DC

## 2017-12-28 NOTE — Progress Notes (Signed)
Refills sent to [facility]

## 2018-01-08 NOTE — Telephone Encounter (Signed)
Mistake.

## 2018-01-14 MED FILL — SULFAMETHOXAZOLE-TMP DS TAB: 800-160 | 30 days supply | Qty: 30 | Fill #0

## 2018-01-14 MED FILL — SYMTUZA 800-150-200-10 MG T: 800-150-200 | 30 days supply | Qty: 30 | Fill #0

## 2018-01-14 MED FILL — OMEPRAZOLE 40 MG CPDR: 40 | 30 days supply | Qty: 30 | Fill #0

## 2018-01-24 ENCOUNTER — Ambulatory Visit (INDEPENDENT_AMBULATORY_CARE_PROVIDER_SITE_OTHER): Payer: Medicaid Other | Admitting: Pharmacist Clinician (PhC)/ Clinical Pharmacy Specialist

## 2018-01-24 DIAGNOSIS — B20 Human immunodeficiency virus [HIV] disease: Secondary | ICD-10-CM | POA: Diagnosis not present

## 2018-01-24 MED ORDER — ONDANSETRON HCL 4 MG PO TABS: 4.0000 mg | ORAL_TABLET | ORAL | 3 refills | Status: DC

## 2018-01-24 MED FILL — ONDANSETRON HCL 4 MG TABS: 4 | 30 days supply | Qty: 30 | Fill #0

## 2018-01-24 NOTE — Progress Notes (Signed)
HPI: [name] is a 62 y.o. male who is here to pick up his meds from pharmacy.   Allergies: No Known Allergies  Vitals:    Past Medical History: Past Medical History:  Diagnosis Date  . Acute kidney failure    unspec, secondary to medications  . AIDS ([place]) 06/07/2016  . Alcohol abuse 05/03/2016  . anal ca dx'd 02/2011  . Cachexia ([place])   . 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 Drug abuse ([place])   . GERD (gastroesophageal reflux disease) 08/09/2016  . Grieving 11/29/2017  . Hemorrhoid   . Hepatitis C   . History of radiation therapy 01/01/2011 -05/18/11   anal, pelvis, inguinal lymph nodes  . HIV or AIDS   . Hypertension   . Loose stools 06/07/2016  . Mass of anus 01/26/2011   squamous cell cancer  . Meningitis   . Mood disorder ([place])   . Pain    right leg  . Pulmonary nodule   . Rash and nonspecific skin eruption 03/31/2015  . Testicular hypofunction     Social History: Social History   Socioeconomic History  . Marital status: Single    Spouse name: Not on file  . Number of children: Not on file  . Years of education: Not on file  . Highest education level: Not on file  Occupational History  . Not on file  Social Needs  . Financial resource strain: Not on file  . Food insecurity:    Worry: Not on file    Inability: Not on file  . Transportation needs:    Medical: Not on file    Non-medical: Not on file  Tobacco Use  . Smoking status: Current Every Day Smoker    Packs/day: 0.25    Types: Cigarettes  . Smokeless tobacco: Never Used  . Tobacco comment: 2-3 day  Substance and Sexual Activity  . Alcohol use: Yes    Alcohol/week: 1.2 - 1.8 oz    Types: 2 - 3 Cans of beer per week  . Drug use: No  . Sexual activity: Not Currently    Partners: Male    Birth control/protection: Condom    Comment: given condoms  Lifestyle  . Physical activity:    Days per week: Not on file    Minutes per session: Not on file  . Stress: Not on file  Relationships  . Social  connections:    Talks on phone: Not on file    Gets together: Not on file    Attends religious service: Not on file    Active member of club or organization: Not on file    Attends meetings of clubs or organizations: Not on file    Relationship status: Not on file  Other Topics Concern  . Not on file  Social History Narrative  . Not on file    Previous Regimen: DTG/Descovy, DRV/r/TRV   Current Regimen: Symtuza  Labs: HIV 1 RNA Quant  Date Value  12/26/2017 21 copies/mL (H)  11/29/2017 <20 Copies/mL (H)  10/22/2017 185 copies/mL (H)   CD4 T Cell Abs (/uL)  Date Value  12/26/2017 190 (L)  11/29/2017 190 (L)  10/22/2017 160 (L)   Hep B S Ab (no units)  Date Value  10/01/2006 NO   Hepatitis B Surface Ag (no units)  Date Value  10/01/2006 NO   HCV Ab (no units)  Date Value  10/01/2006 YES    CrCl: CrCl cannot be calculated (Patient's most recent lab result is older  than the maximum 21 days allowed.).  Lipids:    Component Value Date/Time   CHOL 91 07/23/2017 1513   TRIG 78 07/23/2017 1513   HDL 42 07/23/2017 1513   CHOLHDL 2.2 07/23/2017 1513   VLDL 79 (H) 07/22/2014 1128   LDLCALC 33 07/23/2017 1513    Assessment: [name] is here today for an adherence visit with pharmacy and pick up his meds. He claimed to be taking it except that complained of some nausea. He has been taking them around lunch time but he has forgotten sometimes and take them later. There were a couple of times where he double up the dose. Counseling again on not to do that. He can take the pill late but not doubling up the dose. We are going to delay the labs until the next visit.   Going to give him some zofran to take before each dose of antiretrovirals. [name] is going to drive over to [place] today to pick up.   Recommendations:  Continue Symtuza 1 PO daily Pick up meds today F/u next month for meds and labs  [name], PharmD, BCPS, AAHIVP, CPP Clinical Infectious [place] [facility] 01/24/2018, 2:54 PM

## 2018-02-18 MED FILL — ONDANSETRON HCL 4 MG TABS: 4 | 30 days supply | Qty: 30 | Fill #1

## 2018-02-18 MED FILL — OMEPRAZOLE 40 MG CPDR: 40 | 30 days supply | Qty: 30 | Fill #1

## 2018-02-18 MED FILL — SYMTUZA 800-150-200-10 MG T: 800-150-200 | 30 days supply | Qty: 30 | Fill #1

## 2018-02-18 MED FILL — SULFAMETHOXAZOLE-TMP DS TAB: 800-160 | 30 days supply | Qty: 30 | Fill #1

## 2018-02-21 ENCOUNTER — Ambulatory Visit (INDEPENDENT_AMBULATORY_CARE_PROVIDER_SITE_OTHER): Payer: Medicaid Other | Admitting: Pharmacist Clinician (PhC)/ Clinical Pharmacy Specialist

## 2018-02-21 DIAGNOSIS — B20 Human immunodeficiency virus [HIV] disease: Secondary | ICD-10-CM | POA: Diagnosis present

## 2018-02-21 NOTE — Progress Notes (Signed)
HPI: [name] is a 62 y.o. male   Allergies: No Known Allergies  Vitals:    Past Medical History: Past Medical History:  Diagnosis Date  . Acute kidney failure    unspec, secondary to medications  . AIDS ([place]) 06/07/2016  . Alcohol abuse 05/03/2016  . anal ca dx'd 02/2011  . Cachexia ([place])   . 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 Drug abuse ([place])   . GERD (gastroesophageal reflux disease) 08/09/2016  . Grieving 11/29/2017  . Hemorrhoid   . Hepatitis C   . History of radiation therapy 01/01/2011 -05/18/11   anal, pelvis, inguinal lymph nodes  . HIV or AIDS   . Hypertension   . Loose stools 06/07/2016  . Mass of anus 01/26/2011   squamous cell cancer  . Meningitis   . Mood disorder ([place])   . Pain    right leg  . Pulmonary nodule   . Rash and nonspecific skin eruption 03/31/2015  . Testicular hypofunction     Social History: Social History   Socioeconomic History  . Marital status: Single    Spouse name: Not on file  . Number of children: Not on file  . Years of education: Not on file  . Highest education level: Not on file  Occupational History  . Not on file  Social Needs  . Financial resource strain: Not on file  . Food insecurity:    Worry: Not on file    Inability: Not on file  . Transportation needs:    Medical: Not on file    Non-medical: Not on file  Tobacco Use  . Smoking status: Current Every Day Smoker    Packs/day: 0.25    Types: Cigarettes  . Smokeless tobacco: Never Used  . Tobacco comment: 2-3 day  Substance and Sexual Activity  . Alcohol use: Yes    Alcohol/week: 1.2 - 1.8 oz    Types: 2 - 3 Cans of beer per week  . Drug use: No  . Sexual activity: Not Currently    Partners: Male    Birth control/protection: Condom    Comment: given condoms  Lifestyle  . Physical activity:    Days per week: Not on file    Minutes per session: Not on file  . Stress: Not on file  Relationships  . Social connections:    Talks on phone: Not on file    Gets  together: Not on file    Attends religious service: Not on file    Active member of club or organization: Not on file    Attends meetings of clubs or organizations: Not on file    Relationship status: Not on file  Other Topics Concern  . Not on file  Social History Narrative  . Not on file    Previous Regimen: DTG/Descovy, DRV/r/TRV  Current Regimen: Symtuza  Labs: HIV 1 RNA Quant  Date Value  12/26/2017 21 copies/mL (H)  11/29/2017 <20 Copies/mL (H)  10/22/2017 185 copies/mL (H)   CD4 T Cell Abs (/uL)  Date Value  12/26/2017 190 (L)  11/29/2017 190 (L)  10/22/2017 160 (L)   Hep B S Ab (no units)  Date Value  10/01/2006 NO   Hepatitis B Surface Ag (no units)  Date Value  10/01/2006 NO   HCV Ab (no units)  Date Value  10/01/2006 YES    CrCl: CrCl cannot be calculated (Patient's most recent lab result is older than the maximum 21 days allowed.).  Lipids:  Component Value Date/Time   CHOL 91 07/23/2017 1513   TRIG 78 07/23/2017 1513   HDL 42 07/23/2017 1513   CHOLHDL 2.2 07/23/2017 1513   VLDL 79 (H) 07/22/2014 1128   LDLCALC 33 07/23/2017 1513    Assessment: [name] was brought by [name] today to the visit with pharmacy. He has been doing well with taking meds. He is suppressed now on Symtuza. We will get labs today. Gave him his monthly meds. F/u with pharmacy in 1 mo to get meds and adherence check.   His CD4 is still below 200 so he will continue Bactrim. [name] gave him from [employer] from the pantry today.   Recommendations:  HIV VL and CD4 Continue Symtuza 1 PO qday Continue Bactrim 1 PO qday  [name], PharmD, [place], AAHIVP, CPP Clinical Infectious [place] for Infectious Disease 02/21/2018, 2:18 PM

## 2018-02-22 LAB — T-HELPER CELL (CD4) - (RCID CLINIC ONLY)
CD4 % Helper T Cell: 16 % — ABNORMAL LOW (ref 33–55)
CD4 T Cell Abs: 130 /uL — ABNORMAL LOW (ref 400–2700)

## 2018-02-23 LAB — HIV-1 RNA QUANT-NO REFLEX-BLD
HIV 1 RNA Quant: <20 copies/mL
HIV-1 RNA Quant, Log: <1.3 Log copies/mL

## 2018-03-19 MED FILL — SULFAMETHOXAZOLE-TMP DS TAB: 800-160 | 30 days supply | Qty: 30 | Fill #2

## 2018-03-19 MED FILL — SYMTUZA 800-150-200-10 MG T: 800-150-200 | 30 days supply | Qty: 30 | Fill #2

## 2018-03-19 MED FILL — OMEPRAZOLE 40 MG CPDR: 40 | 30 days supply | Qty: 30 | Fill #2

## 2018-03-19 MED FILL — ONDANSETRON HCL 4 MG TABLET: 4 | 30 days supply | Qty: 30 | Fill #2

## 2018-03-21 ENCOUNTER — Ambulatory Visit: Payer: Medicaid Other

## 2018-04-10 ENCOUNTER — Ambulatory Visit: Payer: Medicaid Other

## 2018-04-15 MED FILL — SYMTUZA 800-150-200-10 MG T: 800-150-200 | 30 days supply | Qty: 30 | Fill #3

## 2018-04-15 MED FILL — SULFAMETHOXAZOLE-TMP DS TAB: 800-160 | 30 days supply | Qty: 30 | Fill #3

## 2018-04-15 MED FILL — ONDANSETRON HCL 4 MG TABLET: 4 | 30 days supply | Qty: 30 | Fill #3

## 2018-04-15 MED FILL — OMEPRAZOLE 40 MG CPDR: 40 | 30 days supply | Qty: 30 | Fill #3

## 2018-04-18 ENCOUNTER — Ambulatory Visit (INDEPENDENT_AMBULATORY_CARE_PROVIDER_SITE_OTHER): Payer: Medicaid Other | Admitting: Pharmacist

## 2018-04-18 DIAGNOSIS — B20 Human immunodeficiency virus [HIV] disease: Secondary | ICD-10-CM | POA: Diagnosis present
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
## 2018-04-19 LAB — T-HELPER CELL (CD4) - (RCID CLINIC ONLY)
CD4 % Helper T Cell: 13 % — ABNORMAL LOW (ref 33–55)
CD4 T Cell Abs: 120 /uL — ABNORMAL LOW (ref 400–2700)

## 2018-04-23 LAB — HIV-1 RNA QUANT-NO REFLEX-BLD
HIV 1 RNA Quant: 76 copies/mL — ABNORMAL HIGH
HIV-1 RNA Quant, Log: 1.88 Log copies/mL — ABNORMAL HIGH

## 2018-05-07 ENCOUNTER — Emergency Department (HOSPITAL_COMMUNITY): Payer: Medicaid Other

## 2018-05-07 ENCOUNTER — Encounter (HOSPITAL_COMMUNITY): Payer: Self-pay | Admitting: Emergency Medicine

## 2018-05-07 ENCOUNTER — Inpatient Hospital Stay (HOSPITAL_COMMUNITY)
Admission: EM | Admit: 2018-05-07 | Discharge: 2018-05-11 | DRG: 199 | Disposition: A | Discharge status: Home Health | Payer: Medicaid Other | Attending: Surgery | Admitting: Surgery

## 2018-05-07 DIAGNOSIS — B182 Chronic viral hepatitis C: Secondary | ICD-10-CM | POA: Diagnosis present

## 2018-05-07 DIAGNOSIS — W3400XD Accidental discharge from unspecified firearms or gun, subsequent encounter: Secondary | ICD-10-CM | POA: Diagnosis not present

## 2018-05-07 DIAGNOSIS — Z8619 Personal history of other infectious and parasitic diseases: Secondary | ICD-10-CM | POA: Diagnosis not present

## 2018-05-07 DIAGNOSIS — Z923 Personal history of irradiation: Secondary | ICD-10-CM

## 2018-05-07 DIAGNOSIS — Z85048 Personal history of other malignant neoplasm of rectum, rectosigmoid junction, and anus: Secondary | ICD-10-CM

## 2018-05-07 DIAGNOSIS — S2242XB Multiple fractures of ribs, left side, initial encounter for open fracture: Secondary | ICD-10-CM

## 2018-05-07 DIAGNOSIS — R000 Tachycardia, unspecified: Secondary | ICD-10-CM | POA: Diagnosis present

## 2018-05-07 DIAGNOSIS — B20 Human immunodeficiency virus [HIV] disease: Secondary | ICD-10-CM

## 2018-05-07 DIAGNOSIS — S21102D Unspecified open wound of left front wall of thorax without penetration into thoracic cavity, subsequent encounter: Secondary | ICD-10-CM | POA: Diagnosis not present

## 2018-05-07 DIAGNOSIS — J9601 Acute respiratory failure with hypoxia: Secondary | ICD-10-CM | POA: Diagnosis present

## 2018-05-07 DIAGNOSIS — N179 Acute kidney failure, unspecified: Secondary | ICD-10-CM | POA: Diagnosis present

## 2018-05-07 DIAGNOSIS — J942 Hemothorax: Secondary | ICD-10-CM

## 2018-05-07 DIAGNOSIS — F1010 Alcohol abuse, uncomplicated: Secondary | ICD-10-CM | POA: Diagnosis present

## 2018-05-07 DIAGNOSIS — S21332A Puncture wound without foreign body of left front wall of thorax with penetration into thoracic cavity, initial encounter: Secondary | ICD-10-CM | POA: Diagnosis present

## 2018-05-07 DIAGNOSIS — Z79899 Other long term (current) drug therapy: Secondary | ICD-10-CM

## 2018-05-07 DIAGNOSIS — Y92512 Supermarket, store or market as the place of occurrence of the external cause: Secondary | ICD-10-CM

## 2018-05-07 DIAGNOSIS — J939 Pneumothorax, unspecified: Secondary | ICD-10-CM

## 2018-05-07 DIAGNOSIS — S2242XA Multiple fractures of ribs, left side, initial encounter for closed fracture: Secondary | ICD-10-CM | POA: Diagnosis present

## 2018-05-07 DIAGNOSIS — Z85828 Personal history of other malignant neoplasm of skin: Secondary | ICD-10-CM

## 2018-05-07 DIAGNOSIS — K219 Gastro-esophageal reflux disease without esophagitis: Secondary | ICD-10-CM | POA: Diagnosis present

## 2018-05-07 DIAGNOSIS — S272XXA Traumatic hemopneumothorax, initial encounter: Principal | ICD-10-CM | POA: Diagnosis present

## 2018-05-07 DIAGNOSIS — S299XXA Unspecified injury of thorax, initial encounter: Secondary | ICD-10-CM

## 2018-05-07 DIAGNOSIS — I10 Essential (primary) hypertension: Secondary | ICD-10-CM | POA: Diagnosis present

## 2018-05-07 DIAGNOSIS — T148XXD Other injury of unspecified body region, subsequent encounter: Secondary | ICD-10-CM | POA: Diagnosis not present

## 2018-05-07 DIAGNOSIS — S21132A Puncture wound without foreign body of left front wall of thorax without penetration into thoracic cavity, initial encounter: Secondary | ICD-10-CM | POA: Diagnosis present

## 2018-05-07 DIAGNOSIS — Z792 Long term (current) use of antibiotics: Secondary | ICD-10-CM | POA: Diagnosis not present

## 2018-05-07 DIAGNOSIS — T797XXA Traumatic subcutaneous emphysema, initial encounter: Secondary | ICD-10-CM | POA: Diagnosis present

## 2018-05-07 DIAGNOSIS — Z8661 Personal history of infections of the central nervous system: Secondary | ICD-10-CM

## 2018-05-07 DIAGNOSIS — F1410 Cocaine abuse, uncomplicated: Secondary | ICD-10-CM | POA: Diagnosis present

## 2018-05-07 DIAGNOSIS — Y249XXA Unspecified firearm discharge, undetermined intent, initial encounter: Secondary | ICD-10-CM

## 2018-05-07 DIAGNOSIS — W3400XA Accidental discharge from unspecified firearms or gun, initial encounter: Secondary | ICD-10-CM

## 2018-05-07 DIAGNOSIS — D62 Acute posthemorrhagic anemia: Secondary | ICD-10-CM | POA: Diagnosis present

## 2018-05-07 DIAGNOSIS — S270XXA Traumatic pneumothorax, initial encounter: Secondary | ICD-10-CM

## 2018-05-07 HISTORY — DX: Asymptomatic human immunodeficiency virus (hiv) infection status: Z21

## 2018-05-07 HISTORY — DX: Human immunodeficiency virus (HIV) disease: B20

## 2018-05-07 LAB — CBC
HCT: 42.4 % (ref 39.0–52.0)
HCT: 39.7 % (ref 39.0–52.0)
HCT: 43.1 % (ref 39.0–52.0)
Hemoglobin: 13.9 g/dL (ref 13.0–17.0)
Hemoglobin: 12.7 g/dL — ABNORMAL LOW (ref 13.0–17.0)
Hemoglobin: 13.1 g/dL (ref 13.0–17.0)
MCH: 30.7 pg (ref 26.0–34.0)
MCH: 30.7 pg (ref 26.0–34.0)
MCH: 30.3 pg (ref 26.0–34.0)
MCHC: 32.8 g/dL (ref 30.0–36.0)
MCHC: 32 g/dL (ref 30.0–36.0)
MCHC: 30.4 g/dL (ref 30.0–36.0)
MCV: 93.6 fL (ref 78.0–100.0)
MCV: 95.9 fL (ref 78.0–100.0)
MCV: 99.8 fL (ref 78.0–100.0)
Platelets: 296 10*3/uL (ref 150–400)
Platelets: 263 10*3/uL (ref 150–400)
Platelets: 285 10*3/uL (ref 150–400)
RBC: 4.53 MIL/uL (ref 4.22–5.81)
RBC: 4.14 MIL/uL — ABNORMAL LOW (ref 4.22–5.81)
RBC: 4.32 MIL/uL (ref 4.22–5.81)
RDW: 12.1 % (ref 11.5–15.5)
RDW: 12.2 % (ref 11.5–15.5)
RDW: 12.4 % (ref 11.5–15.5)
WBC: 5.8 10*3/uL (ref 4.0–10.5)
WBC: 9.5 10*3/uL (ref 4.0–10.5)
WBC: 10.6 10*3/uL — ABNORMAL HIGH (ref 4.0–10.5)

## 2018-05-07 LAB — BPAM FFP
Blood Product Expiration Date: 201910052359
Blood Product Expiration Date: 201910052359
ISSUE DATE / TIME: 201910010216
ISSUE DATE / TIME: 201910010216
Unit Type and Rh: 600
Unit Type and Rh: 6200

## 2018-05-07 LAB — BASIC METABOLIC PANEL
Anion gap: 12 (ref 5–15)
BUN: 12 mg/dL (ref 8–23)
CO2: 19 mmol/L — ABNORMAL LOW (ref 22–32)
Calcium: 8.5 mg/dL — ABNORMAL LOW (ref 8.9–10.3)
Chloride: 107 mmol/L (ref 98–111)
Creatinine, Ser: 1.01 mg/dL (ref 0.61–1.24)
GFR calc Af Amer: >60 mL/min (ref >60)
GFR calc non Af Amer: >60 mL/min (ref >60)
Glucose, Bld: 75 mg/dL (ref 70–99)
Potassium: 3.8 mmol/L (ref 3.5–5.1)
Sodium: 138 mmol/L (ref 135–145)

## 2018-05-07 LAB — PREPARE FRESH FROZEN PLASMA
Unit division: 0
Unit division: 0

## 2018-05-07 LAB — PROTIME-INR
INR: 1.02
Prothrombin Time: 13.3 seconds (ref 11.4–15.2)

## 2018-05-07 LAB — COMPREHENSIVE METABOLIC PANEL
ALT: 38 U/L (ref 0–44)
AST: 55 U/L — ABNORMAL HIGH (ref 15–41)
Albumin: 3.7 g/dL (ref 3.5–5.0)
Alkaline Phosphatase: 47 U/L (ref 38–126)
Anion gap: 13 (ref 5–15)
BUN: 13 mg/dL (ref 8–23)
CO2: 21 mmol/L — ABNORMAL LOW (ref 22–32)
Calcium: 9.2 mg/dL (ref 8.9–10.3)
Chloride: 101 mmol/L (ref 98–111)
Creatinine, Ser: 1.16 mg/dL (ref 0.61–1.24)
GFR calc Af Amer: >60 mL/min (ref >60)
GFR calc non Af Amer: >60 mL/min (ref >60)
Glucose, Bld: 102 mg/dL — ABNORMAL HIGH (ref 70–99)
Potassium: 3.6 mmol/L (ref 3.5–5.1)
Sodium: 135 mmol/L (ref 135–145)
Total Bilirubin: 0.7 mg/dL (ref 0.3–1.2)
Total Protein: 7.7 g/dL (ref 6.5–8.1)

## 2018-05-07 LAB — URINALYSIS, ROUTINE W REFLEX MICROSCOPIC
Bacteria, UA: NONE SEEN
Bilirubin Urine: NEGATIVE
Glucose, UA: NEGATIVE mg/dL
Ketones, ur: NEGATIVE mg/dL
Leukocytes, UA: NEGATIVE
Nitrite: NEGATIVE
Protein, ur: NEGATIVE mg/dL
Specific Gravity, Urine: 1.011 (ref 1.005–1.030)
pH: 5 (ref 5.0–8.0)

## 2018-05-07 LAB — LIPID PANEL
Cholesterol: 118 mg/dL (ref 0–200)
HDL: 66 mg/dL (ref >40)
LDL Cholesterol: 40 mg/dL (ref 0–99)
Total CHOL/HDL Ratio: 1.8 RATIO
Triglycerides: 62 mg/dL (ref <150)
VLDL: 12 mg/dL (ref 0–40)

## 2018-05-07 LAB — BLOOD PRODUCT ORDER (VERBAL) VERIFICATION

## 2018-05-07 LAB — I-STAT CG4 LACTIC ACID, ED: Lactic Acid, Venous: 2.03 mmol/L (ref 0.5–1.9)

## 2018-05-07 LAB — I-STAT CHEM 8, ED
BUN: 16 mg/dL (ref 8–23)
Calcium, Ion: 1.14 mmol/L — ABNORMAL LOW (ref 1.15–1.40)
Chloride: 102 mmol/L (ref 98–111)
Creatinine, Ser: 1.4 mg/dL — ABNORMAL HIGH (ref 0.61–1.24)
Glucose, Bld: 98 mg/dL (ref 70–99)
HCT: 45 % (ref 39.0–52.0)
Hemoglobin: 15.3 g/dL (ref 13.0–17.0)
Potassium: 3.9 mmol/L (ref 3.5–5.1)
Sodium: 138 mmol/L (ref 135–145)
TCO2: 24 mmol/L (ref 22–32)

## 2018-05-07 LAB — ETHANOL: Alcohol, Ethyl (B): 186 mg/dL — ABNORMAL HIGH (ref <10)

## 2018-05-07 LAB — MRSA PCR SCREENING: MRSA by PCR: NEGATIVE

## 2018-05-07 LAB — ABO/RH: ABO/RH(D): O NEG

## 2018-05-07 LAB — CDS SEROLOGY
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
## 2018-05-07 MED ORDER — MORPHINE SULFATE (PF) 4 MG/ML IV SOLN
INTRAVENOUS | Status: AC
  Administered 2018-05-07: 4 mg
  Filled 2018-05-07: qty 1

## 2018-05-07 MED ORDER — FOLIC ACID 1 MG PO TABS
1.0000 mg | ORAL_TABLET | Freq: Every day | ORAL | Status: DC
Start: 2018-05-07 — End: 2018-05-11
  Administered 2018-05-07 – 2018-05-11 (×5): 1 mg via ORAL
  Filled 2018-05-07 (×5): qty 1

## 2018-05-07 MED ORDER — SODIUM CHLORIDE 0.9 % IV SOLN
INTRAVENOUS | Status: AC | PRN
  Administered 2018-05-07: 1000 mL via INTRAVENOUS

## 2018-05-07 MED ORDER — LIDOCAINE-EPINEPHRINE 1 %-1:100000 IJ SOLN
20.0000 mL | Freq: Once | INTRAMUSCULAR | Status: AC
  Administered 2018-05-07: 20 mL

## 2018-05-07 MED ORDER — MORPHINE SULFATE (PF) 2 MG/ML IV SOLN: 1.0000 mg | INTRAVENOUS | Status: DC | PRN

## 2018-05-07 MED ORDER — EMTRICITABINE-TENOFOVIR AF 200-25 MG PO TABS
1.0000 | ORAL_TABLET | Freq: Every day | ORAL | Status: DC
  Administered 2018-05-07 – 2018-05-11 (×5): 1 via ORAL
  Filled 2018-05-07 (×5): qty 1

## 2018-05-07 MED ORDER — ONDANSETRON 4 MG PO TBDP: 4.0000 mg | ORAL_TABLET | Freq: Four times a day (QID) | ORAL | Status: DC | PRN

## 2018-05-07 MED ORDER — IPRATROPIUM-ALBUTEROL 0.5-2.5 (3) MG/3ML IN SOLN: 3.0000 mL | Freq: Four times a day (QID) | RESPIRATORY_TRACT | Status: DC | PRN

## 2018-05-07 MED ORDER — MIDAZOLAM HCL 2 MG/2ML IJ SOLN
INTRAMUSCULAR | Status: AC
  Administered 2018-05-07: 2 mg
  Filled 2018-05-07: qty 2

## 2018-05-07 MED ORDER — SODIUM CHLORIDE 0.9 % IV BOLUS
1000.0000 mL | Freq: Once | INTRAVENOUS | Status: AC
  Administered 2018-05-07: 1000 mL via INTRAVENOUS

## 2018-05-07 MED ORDER — DOCUSATE SODIUM 100 MG PO CAPS
100.0000 mg | ORAL_CAPSULE | Freq: Two times a day (BID) | ORAL | Status: DC
  Administered 2018-05-07 – 2018-05-11 (×7): 100 mg via ORAL
  Filled 2018-05-07 (×8): qty 1

## 2018-05-07 MED ORDER — HYDROMORPHONE HCL 1 MG/ML IJ SOLN
1.0000 mg | INTRAMUSCULAR | Status: DC | PRN
  Administered 2018-05-07 (×4): 1 mg via INTRAVENOUS
  Filled 2018-05-07 (×4): qty 1

## 2018-05-07 MED ORDER — POTASSIUM CHLORIDE IN NACL 20-0.9 MEQ/L-% IV SOLN
INTRAVENOUS | Status: DC
  Administered 2018-05-07 (×2): via INTRAVENOUS
  Filled 2018-05-07 (×2): qty 1000

## 2018-05-07 MED ORDER — OXYCODONE HCL 5 MG PO TABS
5.0000 mg | ORAL_TABLET | ORAL | Status: DC | PRN
  Administered 2018-05-08: 5 mg via ORAL
  Filled 2018-05-07: qty 1

## 2018-05-07 MED ORDER — SULFAMETHOXAZOLE-TRIMETHOPRIM 800-160 MG PO TABS
1.0000 | ORAL_TABLET | Freq: Every day | ORAL | Status: DC
  Administered 2018-05-07 – 2018-05-11 (×5): 1 via ORAL
  Filled 2018-05-07 (×5): qty 1

## 2018-05-07 MED ORDER — ENOXAPARIN SODIUM 40 MG/0.4ML ~~LOC~~ SOLN
40.0000 mg | SUBCUTANEOUS | Status: DC
  Administered 2018-05-07 – 2018-05-10 (×4): 40 mg via SUBCUTANEOUS
  Filled 2018-05-07 (×4): qty 0.4

## 2018-05-07 MED ORDER — ONDANSETRON HCL 4 MG/2ML IJ SOLN
4.0000 mg | Freq: Four times a day (QID) | INTRAMUSCULAR | Status: DC | PRN
  Administered 2018-05-08: 4 mg via INTRAVENOUS
  Filled 2018-05-07: qty 2

## 2018-05-07 MED ORDER — VITAMIN B-1 100 MG PO TABS
100.0000 mg | ORAL_TABLET | Freq: Every day | ORAL | Status: DC
Start: 2018-05-07 — End: 2018-05-11
  Administered 2018-05-07 – 2018-05-11 (×5): 100 mg via ORAL
  Filled 2018-05-07 (×5): qty 1

## 2018-05-07 MED ORDER — MORPHINE SULFATE (PF) 4 MG/ML IV SOLN
4.0000 mg | INTRAVENOUS | Status: DC | PRN
  Filled 2018-05-07: qty 1

## 2018-05-07 MED ORDER — OXYCODONE HCL 5 MG PO TABS
10.0000 mg | ORAL_TABLET | ORAL | Status: DC | PRN
  Administered 2018-05-07 (×3): 10 mg via ORAL
  Filled 2018-05-07 (×3): qty 2

## 2018-05-07 MED ORDER — DARUNAVIR-COBICISTAT 800-150 MG PO TABS
1.0000 | ORAL_TABLET | Freq: Every day | ORAL | Status: DC
  Administered 2018-05-07 – 2018-05-11 (×5): 1 via ORAL
  Filled 2018-05-07 (×5): qty 1
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
## 2018-05-07 NOTE — ED Notes (Signed)
Wallet, 2 lighters, pocket knife, keys, $48 cash, $2.06 in change taken upstairs with pt

## 2018-05-07 NOTE — ED Notes (Signed)
Dr. Ninfa Linden at bedside preparing to inset chest tube

## 2018-05-07 NOTE — ED Notes (Signed)
Clothing (long sleeve shirt, shorts, pants, belt, hat) and shoes placed in brown paper bag for CSI. 3 small baggies of drugs removed from pt mouth and given to [employer].

## 2018-05-07 NOTE — Clinical Social Work Note (Addendum)
Clinical Social Work Assessment  Patient Details  Name: [name] MRN: [id number] Date of Birth: 1956-02-10  Date of referral:  05/07/18               Reason for consult:  Substance Use/ETOH Abuse                Permission sought to share information with:    Permission granted to share information::     Name::        Agency::     Relationship::     Contact Information:     Housing/Transportation Living arrangements for the past 2 months:  Single Family Home Source of Information:  Patient Patient Interpreter Needed:  None Criminal Activity/Legal Involvement Pertinent to Current Situation/Hospitalization:  No - Comment as needed Significant Relationships:  Siblings Lives with:  Self Do you feel safe going back to the place where you live?  Yes Need for family participation in patient care:  Yes (Comment)  Care giving concerns: Patient is alert and oriented. Patient requested that CSW contact his brother. CSW was able to reach family as requested.    Social Worker assessment / plan:  CSW spoke with the patient at bedside to completed SBIRT. Patient states he drinks beer once per week. Patient denies any substance use.    Employment status:  Other Network engineer) Insurance information:  Medicaid [place] PT Recommendations:    Information / Referral to community resources:  SBIRT  Patient/Family's Response to care:  Patient verbalized understanding CSW role and he appreciates the CSW agreeing to contact his family.   Patient/Family's Understanding of and Emotional Response to Diagnosis, Current Treatment, and Prognosis:  Patient verbalized understanding of screening. Patient denied any substance use. Patient declined out patient treatment resources for alcohol and substance use.  CSW will sign off for now as social work intervention is no longer needed.   Emotional Assessment Appearance:  Appears stated age Attitude/Demeanor/Rapport:  Engaged Affect (typically observed):   Anxious, Apprehensive, Appropriate Orientation:  Oriented to Self, Oriented to Place, Oriented to  Time, Oriented to Situation Alcohol / Substance use:  Not Applicable Psych involvement (Current and /or in the community):  No (Comment)  Discharge Needs  Concerns to be addressed:  Substance Abuse Concerns Readmission within the last 30 days:  No Current discharge risk:  None Barriers to Discharge:  Continued Medical Work up   [employer], [place] 05/07/2018, 1:12 PM

## 2018-05-07 NOTE — Progress Notes (Signed)
CSW and chaplin met with the patient at bedside. Patient provided the CSW with brother's name and the name of his church. Patient requested CSW contact his family member. CSW was able to contact patient's brother and provided him with needed information.

## 2018-05-08 ENCOUNTER — Inpatient Hospital Stay (HOSPITAL_COMMUNITY): Payer: Medicaid Other

## 2018-05-08 DIAGNOSIS — T148XXD Other injury of unspecified body region, subsequent encounter: Secondary | ICD-10-CM

## 2018-05-08 DIAGNOSIS — W3400XD Accidental discharge from unspecified firearms or gun, subsequent encounter: Secondary | ICD-10-CM

## 2018-05-08 LAB — TYPE AND SCREEN
ABO/RH(D): O NEG
Antibody Screen: NEGATIVE
Unit division: 0
Unit division: 0

## 2018-05-08 LAB — BPAM RBC
Blood Product Expiration Date: 201910182359
Blood Product Expiration Date: 201910182359
ISSUE DATE / TIME: 201910011639
ISSUE DATE / TIME: 201910012038
Unit Type and Rh: 9500
Unit Type and Rh: 9500

## 2018-05-08 LAB — CBC
HCT: 33.9 % — ABNORMAL LOW (ref 39.0–52.0)
Hemoglobin: 10.7 g/dL — ABNORMAL LOW (ref 13.0–17.0)
MCH: 30.2 pg (ref 26.0–34.0)
MCHC: 31.6 g/dL (ref 30.0–36.0)
MCV: 95.8 fL (ref 78.0–100.0)
Platelets: 224 10*3/uL (ref 150–400)
RBC: 3.54 MIL/uL — ABNORMAL LOW (ref 4.22–5.81)
RDW: 12.1 % (ref 11.5–15.5)
WBC: 5.4 10*3/uL (ref 4.0–10.5)

## 2018-05-08 LAB — GC/CHLAMYDIA PROBE AMP (~~LOC~~) NOT AT ARMC
Chlamydia: NEGATIVE
Neisseria Gonorrhea: NEGATIVE

## 2018-05-08 LAB — RPR: RPR Ser Ql: NONREACTIVE
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
## 2018-05-08 MED ORDER — OXYCODONE HCL 5 MG PO TABS
5.0000 mg | ORAL_TABLET | ORAL | Status: DC | PRN
  Administered 2018-05-08: 10 mg via ORAL
  Administered 2018-05-08: 5 mg via ORAL
  Administered 2018-05-09 – 2018-05-10 (×5): 10 mg via ORAL
  Administered 2018-05-11: 5 mg via ORAL
  Administered 2018-05-11: 10 mg via ORAL
  Filled 2018-05-08 (×3): qty 2
  Filled 2018-05-08: qty 1
  Filled 2018-05-08 (×3): qty 2
  Filled 2018-05-08: qty 1
  Filled 2018-05-08: qty 2

## 2018-05-08 MED ORDER — HYDROMORPHONE HCL 1 MG/ML IJ SOLN: 0.5000 mg | INTRAMUSCULAR | Status: DC | PRN

## 2018-05-08 NOTE — Progress Notes (Signed)
PT Cancellation Note  Patient Details Name: [name] MRN: [id number] DOB: 01/31/1956   Cancelled Treatment:    Reason Eval/Treat Not Completed: Patient declined, no reason specified  Wells Guiles B. Lorisa Scheid, PT, DPT  Acute Rehabilitation [phone] pager [phone]) [phone] office     [name] 05/08/2018, 4:42 PM
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
## 2018-05-08 NOTE — Progress Notes (Signed)
PT Cancellation Note  Patient Details Name: [name] MRN: [id number] DOB: [birth year]   Cancelled Treatment:    Reason Eval/Treat Not Completed: Patient declined, no reason specified.  Pt refused PT again this PM, stating "I just moved" referring to the SDU.  He reported that scooting over from one bed to the other bed was painful and now he didn't want to walk.  "Check on me tomorrow".  PT will check back tomorrow.  Thanks,    Barbarann Ehlers. Finlee Concepcion, PT, DPT  Acute Rehabilitation [phone] pager #[phone] office   05/08/2018, 5:53 PM
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
## 2018-05-09 ENCOUNTER — Inpatient Hospital Stay (HOSPITAL_COMMUNITY): Payer: Medicaid Other

## 2018-05-09 LAB — CBC WITH DIFFERENTIAL/PLATELET
Abs Immature Granulocytes: 0 10*3/uL (ref 0.0–0.1)
Basophils Absolute: 0 10*3/uL (ref 0.0–0.1)
Basophils Relative: 0 %
Eosinophils Absolute: 0 10*3/uL (ref 0.0–0.7)
Eosinophils Relative: 0 %
HCT: 31.4 % — ABNORMAL LOW (ref 39.0–52.0)
Hemoglobin: 9.9 g/dL — ABNORMAL LOW (ref 13.0–17.0)
Immature Granulocytes: 1 %
Lymphocytes Relative: 16 %
Lymphs Abs: 0.8 10*3/uL (ref 0.7–4.0)
MCH: 30.1 pg (ref 26.0–34.0)
MCHC: 31.5 g/dL (ref 30.0–36.0)
MCV: 95.4 fL (ref 78.0–100.0)
Monocytes Absolute: 0.6 10*3/uL (ref 0.1–1.0)
Monocytes Relative: 12 %
Neutro Abs: 3.7 10*3/uL (ref 1.7–7.7)
Neutrophils Relative %: 71 %
Platelets: 216 10*3/uL (ref 150–400)
RBC: 3.29 MIL/uL — ABNORMAL LOW (ref 4.22–5.81)
RDW: 11.9 % (ref 11.5–15.5)
WBC: 5.2 10*3/uL (ref 4.0–10.5)

## 2018-05-09 LAB — HEPATITIS C VRS RNA DETECT BY PCR-QUAL: Hepatitis C Vrs RNA by PCR-Qual: POSITIVE — AB
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
## 2018-05-09 MED ORDER — HYDROMORPHONE HCL 1 MG/ML IJ SOLN
0.5000 mg | Freq: Four times a day (QID) | INTRAMUSCULAR | Status: DC | PRN
  Administered 2018-05-10: 1 mg via INTRAVENOUS
  Filled 2018-05-09: qty 1
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
## 2018-05-09 NOTE — Progress Notes (Addendum)
SATURATION QUALIFICATIONS: (This note is used to comply with regulatory documentation for home oxygen)  Patient Saturations on Room Air at Rest = 87%  Patient Saturations on Room Air while Ambulating = NT as he desaturated at rest  Patient Saturations on 2 Liters of oxygen while Ambulating = 86%  Please briefly explain why patient needs home oxygen: Pt desaturates on RA at rest and with mobility.  Would benefit from supplemental O2, frequent rest breaks and continued education on pursed lip breathing/pulmonary health.  Barbarann Ehlers Neeley Sedivy, PT, DPT  Acute Rehabilitation [phone] pager [phone]) [phone] office

## 2018-05-10 ENCOUNTER — Inpatient Hospital Stay (HOSPITAL_COMMUNITY): Payer: Medicaid Other
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
## 2018-05-10 IMAGING — DX DG CHEST 1V PORT
1 series · 1 of 1 positions shown · non-contrast
Comparison: Radiograph of same day.

CLINICAL DATA: Left hemothorax.

EXAM:
PORTABLE CHEST 1 VIEW

[chest ap]
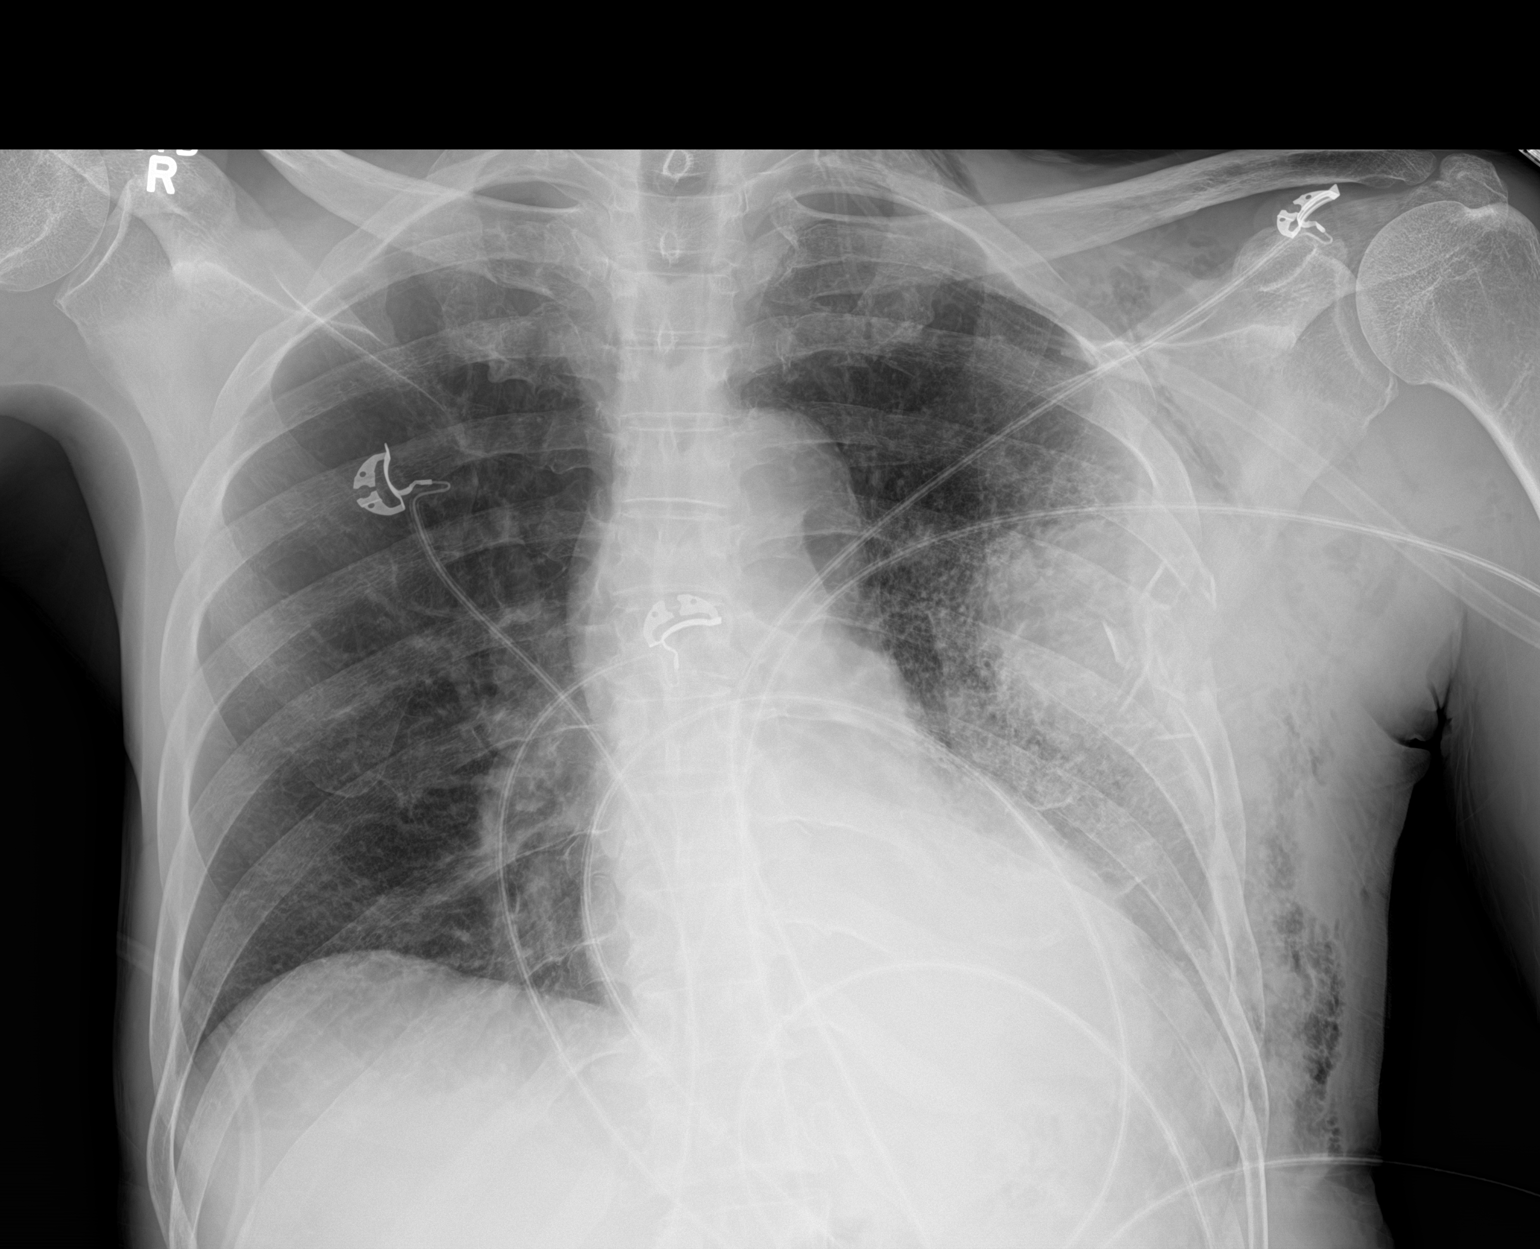

[1 of 1 positions shown; findings below may reference images not displayed]

FINDINGS: The heart size and mediastinal contours are within normal limits.
Right lung is clear. Left-sided chest tube has been removed without
definite pneumothorax. Stable pulmonary contusion is noted laterally
in left midlung. There are adjacent comminuted left rib fractures.
Stable subcutaneous emphysema is seen in left lateral chest wall.
IMPRESSION: No definite pneumothorax status post left-sided chest tube removal.
Stable left pulmonary contusion is noted with overlying comminuted
rib fractures.

## 2018-05-10 MED ORDER — POLYETHYLENE GLYCOL 3350 17 G PO PACK
17.0000 g | PACK | Freq: Every day | ORAL | Status: DC
  Administered 2018-05-11: 17 g via ORAL
  Filled 2018-05-10: qty 1
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
## 2018-05-10 NOTE — Care Management Note (Signed)
Case Management Note  Patient Details  Name: [name] MRN: [id number] Date of Birth: June 21, 1956  Subjective/Objective:                    Action/Plan:  Possible discharge 24 hours, called Reggie with [facility] asked to have DME delivered today  Expected Discharge Date:                  Expected Discharge Plan:  [place]  In-House Referral:  Clinical Social Work  Discharge planning Services  CM Consult  Post Acute Care Choice:  Durable Medical Equipment, Home Health Choice offered to:  Patient  DME Arranged:  3-N-1, Walker rolling DME Agency:  [place]:  PT, OT [place] Agency:  [place]  Status of Service:  Completed, signed off  If discussed at [place] of Stay Meetings, dates discussed:    Additional Comments:  Marilu Favre, RN 05/10/2018, 11:54 AM
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
## 2018-05-11 ENCOUNTER — Inpatient Hospital Stay (HOSPITAL_COMMUNITY): Payer: Medicaid Other
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
## 2018-05-11 MED ORDER — OXYCODONE HCL 5 MG PO TABS: 5.0000 mg | ORAL_TABLET | ORAL | 0 refills | Status: DC | PRN
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
## 2018-05-13 ENCOUNTER — Encounter: Payer: Self-pay | Admitting: Infectious Disease

## 2018-05-13 MED FILL — SYMTUZA 800-150-200-10 MG T: 800-150-200 | 30 days supply | Qty: 30 | Fill #4

## 2018-05-13 MED FILL — SULFAMETHOXAZOLE-TMP DS TAB: 800-160 | 30 days supply | Qty: 30 | Fill #4

## 2018-05-13 MED FILL — OMEPRAZOLE 40 MG CPDR: 40 | 30 days supply | Qty: 30 | Fill #4

## 2018-05-23 ENCOUNTER — Inpatient Hospital Stay: Payer: Medicaid Other | Admitting: Infectious Diseases

## 2018-06-06 ENCOUNTER — Ambulatory Visit (INDEPENDENT_AMBULATORY_CARE_PROVIDER_SITE_OTHER): Payer: Medicaid Other | Admitting: Family

## 2018-06-06 ENCOUNTER — Encounter: Payer: Self-pay | Admitting: Family

## 2018-06-06 VITALS — BP 148/91 | HR 90 | Temp 98.2°F | Ht 66.0 in | Wt 146.0 lb

## 2018-06-06 DIAGNOSIS — B20 Human immunodeficiency virus [HIV] disease: Secondary | ICD-10-CM | POA: Diagnosis not present

## 2018-06-06 DIAGNOSIS — Z23 Encounter for immunization: Secondary | ICD-10-CM | POA: Diagnosis not present

## 2018-06-06 MED ORDER — DARUN-COBIC-EMTRICIT-TENOFAF 800-150-200-10 MG PO TABS: 1.0000 | ORAL_TABLET | Freq: Every day | ORAL | 3 refills | Status: DC

## 2018-06-06 MED ORDER — SULFAMETHOXAZOLE-TRIMETHOPRIM 800-160 MG PO TABS: 1.0000 | ORAL_TABLET | Freq: Every day | ORAL | 3 refills | Status: DC

## 2018-06-06 NOTE — Assessment & Plan Note (Signed)
[name] continues to take his Symtuza and Bactrim as prescribed and picks his medication up from the clinic. No signs/symptoms of opportunistic infection at present. Influenza and Prevnar updated today. He appears to be stable. There is concern about the fatigue that I think needs to be followed over time. May need cardiac work up in the future. Continue current dose of Symtuza and Bactrim. Check viral load and CD4 count today. Declines condoms. Food provided per patient request. Follow up office visit in 3 months or sooner if needed.

## 2018-06-06 NOTE — Progress Notes (Signed)
Flu and prevnar administered today. Patient tolerated well.

## 2018-06-06 NOTE — Patient Instructions (Addendum)
Nice to meet you.   Please continue to take your Symtuza and Bactrim.  Continue to work on nutrition. The fatigue should improve with time.   Follow up office visit in 3 months or sooner if needed with Dr. [name] and lab work 1-2 weeks prior to appointment.
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
## 2018-06-07 LAB — T-HELPER CELL (CD4) - (RCID CLINIC ONLY)
CD4 % Helper T Cell: 17 % — ABNORMAL LOW (ref 33–55)
CD4 T Cell Abs: 180 /uL — ABNORMAL LOW (ref 400–2700)

## 2018-06-10 LAB — HIV-1 RNA QUANT-NO REFLEX-BLD
HIV 1 RNA Quant: 24 copies/mL — ABNORMAL HIGH
HIV-1 RNA Quant, Log: 1.38 Log copies/mL — ABNORMAL HIGH

## 2018-06-13 ENCOUNTER — Telehealth: Payer: Self-pay | Admitting: Behavioral Health

## 2018-06-13 NOTE — Telephone Encounter (Signed)
-----   Message from Golden Circle, [place]  sent at 06/11/2018 12:52 PM EST ----- Please inform Mr. Schulenburg that his viral load is undetectable and CD4 count is 180 both of which are improved. Please continue to take Symtuza and Bactrim and follow up as scheduled.

## 2018-06-13 NOTE — Telephone Encounter (Signed)
Called patient left voicemail to call the doctor's office back and left the call back number. Pricilla Riffle RN

## 2018-06-14 MED FILL — SULFAMETHOXAZOLE-TMP DS TAB: 800-160 | 30 days supply | Qty: 30 | Fill #5

## 2018-06-14 MED FILL — SYMTUZA 800-150-200-10 MG T: 800-150-200 | 30 days supply | Qty: 30 | Fill #5

## 2018-06-14 MED FILL — OMEPRAZOLE 40 MG CPDR: 40 | 30 days supply | Qty: 30 | Fill #5

## 2018-06-18 NOTE — Telephone Encounter (Signed)
Called patient left voicemail to call the doctor's office back and left the call back number. Pricilla Riffle RN

## 2018-09-09 ENCOUNTER — Other Ambulatory Visit: Payer: Medicaid Other

## 2018-09-23 ENCOUNTER — Encounter: Payer: Medicaid Other | Admitting: Family

## 2018-10-24 ENCOUNTER — Encounter: Payer: Self-pay | Admitting: Family

## 2018-10-24 ENCOUNTER — Ambulatory Visit (INDEPENDENT_AMBULATORY_CARE_PROVIDER_SITE_OTHER): Payer: Medicaid Other | Admitting: Family

## 2018-10-24 ENCOUNTER — Other Ambulatory Visit: Payer: Self-pay

## 2018-10-24 VITALS — BP 156/94 | HR 76 | Temp 98.7°F | Wt 143.0 lb

## 2018-10-24 DIAGNOSIS — B20 Human immunodeficiency virus [HIV] disease: Secondary | ICD-10-CM | POA: Diagnosis not present

## 2018-10-24 DIAGNOSIS — R29898 Other symptoms and signs involving the musculoskeletal system: Secondary | ICD-10-CM

## 2018-10-24 DIAGNOSIS — Z0000 Encounter for general adult medical examination without abnormal findings: Secondary | ICD-10-CM | POA: Diagnosis not present

## 2018-10-24 DIAGNOSIS — Z7189 Other specified counseling: Secondary | ICD-10-CM | POA: Insufficient documentation

## 2018-10-24 MED ORDER — SULFAMETHOXAZOLE-TRIMETHOPRIM 800-160 MG PO TABS: 1.0000 | ORAL_TABLET | Freq: Every day | ORAL | 2 refills | Status: DC

## 2018-10-24 MED ORDER — OMEPRAZOLE 40 MG PO CPDR: 40.0000 mg | DELAYED_RELEASE_CAPSULE | Freq: Every day | ORAL | 2 refills | Status: DC

## 2018-10-24 MED ORDER — DARUN-COBIC-EMTRICIT-TENOFAF 800-150-200-10 MG PO TABS: 1.0000 | ORAL_TABLET | Freq: Every day | ORAL | 2 refills | Status: DC

## 2018-10-24 MED ORDER — ONDANSETRON HCL 4 MG PO TABS: 4.0000 mg | ORAL_TABLET | Freq: Every day | ORAL | 1 refills | Status: DC

## 2018-10-24 NOTE — Patient Instructions (Signed)
Nice to see you.  We will check your blood work today.  Please continue to take your Symtuza and Bactrim.  We will get you referred to Neurology for further evaluation of your arm.  Plan for follow up in 1 month or sooner if needed with lab work at that appointment.

## 2018-10-24 NOTE — Assessment & Plan Note (Signed)
[name] reports good adherence and tolerance to Symtuza supplemented with Bactrim for OI prophylaxis. Pharmacy indicates he has not picked up meds which brings to question compliance. He has no signs/symtpoms of opportunistic infection or progressive HIV disease at present. Will check blood work today. Appreciate the care the bridge counselor is providing. Continue current dose of Symtuza. If remains suppressed may consider stopping Bactrim although I have my concern about his consistency when taking the medication. Plan for follow up in 1 month or sooner if needed.
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
## 2018-10-24 NOTE — Assessment & Plan Note (Signed)
   Immunizations are up to date per recommendations.   Discussed importance of safe sexual practice to reduce risk of acquisition and transmission of STI. Declines condoms today.
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
## 2018-10-25 LAB — T-HELPER CELL (CD4) - (RCID CLINIC ONLY)
CD4 % Helper T Cell: 20 % — ABNORMAL LOW (ref 33–55)
CD4 T Cell Abs: 230 /uL — ABNORMAL LOW (ref 400–2700)

## 2018-11-05 LAB — COMPLETE METABOLIC PANEL WITH GFR
AG Ratio: 1.1 (calc) (ref 1.0–2.5)
ALT: 28 U/L (ref 9–46)
AST: 39 U/L — ABNORMAL HIGH (ref 10–35)
Albumin: 3.8 g/dL (ref 3.6–5.1)
Alkaline phosphatase (APISO): 71 U/L (ref 35–144)
BUN: 15 mg/dL (ref 7–25)
CO2: 25 mmol/L (ref 20–32)
Calcium: 8.8 mg/dL (ref 8.6–10.3)
Chloride: 107 mmol/L (ref 98–110)
Creat: 0.93 mg/dL (ref 0.70–1.25)
GFR, Est African American: 100 mL/min/{1.73_m2} (ref >=60)
GFR, Est Non African American: 86 mL/min/{1.73_m2} (ref >=60)
Globulin: 3.5 g/dL (calc) (ref 1.9–3.7)
Glucose, Bld: 97 mg/dL (ref 65–99)
Potassium: 3.8 mmol/L (ref 3.5–5.3)
Sodium: 140 mmol/L (ref 135–146)
Total Bilirubin: 0.4 mg/dL (ref 0.2–1.2)
Total Protein: 7.3 g/dL (ref 6.1–8.1)

## 2018-11-05 LAB — CBC
HCT: 39.9 % (ref 38.5–50.0)
Hemoglobin: 13 g/dL — ABNORMAL LOW (ref 13.2–17.1)
MCH: 28.7 pg (ref 27.0–33.0)
MCHC: 32.6 g/dL (ref 32.0–36.0)
MCV: 88.1 fL (ref 80.0–100.0)
MPV: 9 fL (ref 7.5–12.5)
Platelets: 282 10*3/uL (ref 140–400)
RBC: 4.53 10*6/uL (ref 4.20–5.80)
RDW: 14.4 % (ref 11.0–15.0)
WBC: 3.8 10*3/uL (ref 3.8–10.8)

## 2018-11-05 LAB — HIV-1 RNA ULTRAQUANT REFLEX TO GENTYP+
HIV 1 RNA Quant: 27 copies/mL — ABNORMAL HIGH
HIV-1 RNA Quant, Log: 1.43 Log copies/mL — ABNORMAL HIGH

## 2018-11-14 ENCOUNTER — Telehealth: Payer: Self-pay

## 2018-11-14 ENCOUNTER — Other Ambulatory Visit: Payer: Self-pay | Admitting: Family

## 2018-11-14 NOTE — Telephone Encounter (Signed)
-----   Message from Golden Circle, [place] sent at 11/14/2018  3:56 PM EDT ----- Please inform [name] that his viral load is undetectable and his CD4 count is 230. His kidney function, liver function, and electrolytes are normal. Continue to take Symtuza and it is safe for him to stop taking Bactrim.

## 2018-11-14 NOTE — Telephone Encounter (Signed)
Attempted to call patient with lab results per FNP. Mobile number listed does not belong to patient, and alternative number is inactive. [place]

## 2019-01-14 ENCOUNTER — Telehealth: Payer: Self-pay | Admitting: Family

## 2019-01-14 NOTE — Telephone Encounter (Signed)
COVID-19 Pre-Screening Questions: ° °Do you currently have a fever (>100 °F), chills or unexplained body aches? No  ° °Are you currently experiencing new cough, shortness of breath, sore throat, runny nose?no   °•  °Have you recently travelled outside the state of [place] in the last 14 days? No   °•  °Have you been in contact with someone that is currently pending confirmation of Covid19 testing or has been confirmed to have the Covid19 virus?  No  °

## 2019-01-15 ENCOUNTER — Encounter: Payer: Self-pay | Admitting: Family

## 2019-01-15 ENCOUNTER — Other Ambulatory Visit: Payer: Self-pay

## 2019-01-15 ENCOUNTER — Ambulatory Visit (INDEPENDENT_AMBULATORY_CARE_PROVIDER_SITE_OTHER): Payer: Medicaid Other | Admitting: Family

## 2019-01-15 VITALS — BP 134/82 | HR 84 | Temp 97.5°F | Wt 133.0 lb

## 2019-01-15 DIAGNOSIS — R0789 Other chest pain: Secondary | ICD-10-CM | POA: Diagnosis not present

## 2019-01-15 DIAGNOSIS — Z0000 Encounter for general adult medical examination without abnormal findings: Secondary | ICD-10-CM | POA: Diagnosis not present

## 2019-01-15 DIAGNOSIS — R29898 Other symptoms and signs involving the musculoskeletal system: Secondary | ICD-10-CM

## 2019-01-15 DIAGNOSIS — Z9189 Other specified personal risk factors, not elsewhere classified: Secondary | ICD-10-CM | POA: Diagnosis not present

## 2019-01-15 DIAGNOSIS — B20 Human immunodeficiency virus [HIV] disease: Secondary | ICD-10-CM | POA: Diagnosis not present

## 2019-01-15 NOTE — Assessment & Plan Note (Signed)
[name] reports good adherence and tolerance to his ART regimen of Symtuza. Previous viral load was undetectable. He has no signs/symptoms of opportunistic infection or progressive HIV disease at present. Will check blood work today. He continues to work with our Engineer, materials, Sales executive. Continue current dose of Symtuza with plan to follow up in 3 months or sooner if needed.

## 2019-01-15 NOTE — Assessment & Plan Note (Signed)
Continues to have upper extremity weakness and chest pains likely neuropathic. None on exam today. Awaiting neurology referral for further evaluation and treatment as needed.

## 2019-01-15 NOTE — Assessment & Plan Note (Signed)
   All immunizations are up to date per recommendations  Due for colon cancer screening with referral placed.  Discussed importance of safe sexual practice to reduce risk of acquisition/transmission of STI.

## 2019-01-15 NOTE — Patient Instructions (Signed)
Nice to see you.  We will check your blood work today.  Continue to take your Symtuza daily.  We will get you referred to neurology and also gastroenterology (for colon cancer screening).  Plan to follow up in 3 months or sooner if needed with lab work same day.   Have a great day and stay safe!
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
## 2019-01-16 LAB — T-HELPER CELL (CD4) - (RCID CLINIC ONLY)
CD4 % Helper T Cell: 23 % — ABNORMAL LOW (ref 33–65)
CD4 T Cell Abs: 278 /uL — ABNORMAL LOW (ref 400–1790)

## 2019-01-21 ENCOUNTER — Encounter: Payer: Self-pay | Admitting: Gastroenterology

## 2019-01-25 LAB — CBC
HCT: 41.4 % (ref 38.5–50.0)
Hemoglobin: 13.7 g/dL (ref 13.2–17.1)
MCH: 30 pg (ref 27.0–33.0)
MCHC: 33.1 g/dL (ref 32.0–36.0)
MCV: 90.8 fL (ref 80.0–100.0)
MPV: 8.8 fL (ref 7.5–12.5)
Platelets: 260 10*3/uL (ref 140–400)
RBC: 4.56 10*6/uL (ref 4.20–5.80)
RDW: 13.6 % (ref 11.0–15.0)
WBC: 3.2 10*3/uL — ABNORMAL LOW (ref 3.8–10.8)

## 2019-01-25 LAB — COMPLETE METABOLIC PANEL WITH GFR
AG Ratio: 1.3 (calc) (ref 1.0–2.5)
ALT: 42 U/L (ref 9–46)
AST: 58 U/L — ABNORMAL HIGH (ref 10–35)
Albumin: 4.4 g/dL (ref 3.6–5.1)
Alkaline phosphatase (APISO): 55 U/L (ref 35–144)
BUN: 14 mg/dL (ref 7–25)
CO2: 19 mmol/L — ABNORMAL LOW (ref 20–32)
Calcium: 9 mg/dL (ref 8.6–10.3)
Chloride: 110 mmol/L (ref 98–110)
Creat: 0.99 mg/dL (ref 0.70–1.25)
GFR, Est African American: 93 mL/min/{1.73_m2} (ref >=60)
GFR, Est Non African American: 80 mL/min/{1.73_m2} (ref >=60)
Globulin: 3.4 g/dL (calc) (ref 1.9–3.7)
Glucose, Bld: 101 mg/dL — ABNORMAL HIGH (ref 65–99)
Potassium: 3.8 mmol/L (ref 3.5–5.3)
Sodium: 140 mmol/L (ref 135–146)
Total Bilirubin: 0.5 mg/dL (ref 0.2–1.2)
Total Protein: 7.8 g/dL (ref 6.1–8.1)

## 2019-01-25 LAB — HIV-1 RNA QUANT-NO REFLEX-BLD
HIV 1 RNA Quant: 61 copies/mL — ABNORMAL HIGH
HIV-1 RNA Quant, Log: 1.79 Log copies/mL — ABNORMAL HIGH

## 2019-01-28 ENCOUNTER — Telehealth: Payer: Self-pay

## 2019-01-28 NOTE — Telephone Encounter (Signed)
-----   Message from [place], [place] sent at 01/28/2019  3:38 PM EDT ----- Please inform [name] that his viral load is 61 and his CD4 count is 298. Continue to take Symtuza and follow up in 3 months or sooner if needed.

## 2019-01-28 NOTE — Telephone Encounter (Signed)
Per Jason Piedra, Np called patient to review lab results. Unable to reach patient at this time. Left message requesting a call back to review labs. Did not disclose any information in message. [place]

## 2019-01-31 ENCOUNTER — Telehealth: Payer: Self-pay | Admitting: *Deleted

## 2019-01-31 NOTE — Telephone Encounter (Signed)
Call placed for nurse visit message left for pt to call back by 5:00 p.m to reschedule nurse visit but if we have not received call by 5:00 we will have to cancel procedure.

## 2019-01-31 NOTE — Telephone Encounter (Signed)
Pt. Never returned call procedure cancelled and no show letter mailed.

## 2019-02-08 ENCOUNTER — Other Ambulatory Visit: Payer: Self-pay | Admitting: Family

## 2019-02-08 DIAGNOSIS — B20 Human immunodeficiency virus [HIV] disease: Secondary | ICD-10-CM

## 2019-02-14 ENCOUNTER — Encounter: Payer: Medicaid Other | Admitting: Gastroenterology

## 2019-02-20 ENCOUNTER — Other Ambulatory Visit: Payer: Self-pay | Admitting: Family

## 2019-02-20 DIAGNOSIS — B20 Human immunodeficiency virus [HIV] disease: Secondary | ICD-10-CM

## 2019-03-03 ENCOUNTER — Telehealth: Payer: Self-pay | Admitting: *Deleted

## 2019-03-03 DIAGNOSIS — B20 Human immunodeficiency virus [HIV] disease: Secondary | ICD-10-CM

## 2019-03-03 DIAGNOSIS — K219 Gastro-esophageal reflux disease without esophagitis: Secondary | ICD-10-CM

## 2019-03-03 MED ORDER — OMEPRAZOLE 40 MG PO CPDR: 40.0000 mg | DELAYED_RELEASE_CAPSULE | Freq: Every day | ORAL | 2 refills | Status: DC

## 2019-03-03 MED ORDER — SYMTUZA 800-150-200-10 MG PO TABS: 1.0000 | ORAL_TABLET | Freq: Every day | ORAL | 2 refills | Status: DC

## 2019-03-03 MED ORDER — ONDANSETRON HCL 4 MG PO TABS: 4.0000 mg | ORAL_TABLET | Freq: Every day | ORAL | 1 refills | Status: DC

## 2019-03-03 NOTE — Telephone Encounter (Signed)
Ok to refill Zofran. Does not need Bactrim as his CD4 count is greater than 200. Thanks!

## 2019-03-03 NOTE — Addendum Note (Signed)
Addended by: Reggy Eye on: 03/03/2019 03:29 PM   Modules accepted: Orders

## 2019-03-03 NOTE — Telephone Encounter (Signed)
Patient came to clinic to ask if we could refill his Zofran and Bactrim. Advised him will have to ask the provider and give him a call back. Advised his phone does not work and asked if I could call his Case Oceanographer. Advised will do once provider responds.

## 2019-03-04 MED FILL — ONDANSETRON HCL 4 MG TABLET: 4 | 20 days supply | Qty: 20 | Fill #0

## 2019-03-04 MED FILL — SULFAMETHOXAZOLE-TMP DS TAB: 800-160 | 30 days supply | Qty: 30 | Fill #0

## 2019-03-04 MED FILL — SYMTUZA 800-150-200-10 MG T: 800-150-200 | 30 days supply | Qty: 30 | Fill #0

## 2019-03-04 MED FILL — OMEPRAZOLE 40 MG CPDR: 40 | 30 days supply | Qty: 30 | Fill #0

## 2019-03-05 ENCOUNTER — Telehealth: Payer: Self-pay | Admitting: Pharmacy Technician

## 2019-03-05 NOTE — Telephone Encounter (Addendum)
[facility] Patient Advocate Encounter  Patient's medications have been couriered to [facility] from [facility] and are ready for pick up.  Case manager [name] will give the medications to patient who is homeless.  [name] is taking them to him today 03/12/2019.  [name]. Nadara Mustard [place] Patient [facility] for Infectious [facility] Phone: [phone] Fax:  [phone]

## 2019-04-07 MED FILL — SYMTUZA 800-150-200-10 MG T: 800-150-200 | 30 days supply | Qty: 30 | Fill #1

## 2019-04-07 MED FILL — OMEPRAZOLE 40 MG CPDR: 40 | 30 days supply | Qty: 30 | Fill #1

## 2019-04-07 MED FILL — SULFAMETHOXAZOLE-TMP DS TAB: 800-160 | 30 days supply | Qty: 30 | Fill #1

## 2019-04-08 ENCOUNTER — Telehealth: Payer: Self-pay | Admitting: Pharmacy Technician

## 2019-04-08 NOTE — Telephone Encounter (Signed)
Medication arrived today to the clinic at 16:00 from [facility]. Alerted Mitch that the medications had arrived and he will reach out to the patient.

## 2019-04-10 NOTE — Telephone Encounter (Addendum)
The patient's medication has been picked up by Mitch this afternoon and will be delivered to the patient's living residence. Moving forward, we are going to try and have the medication shipped to his brother's house and he deliver the med to the patient.   Address: [facility] Attn: Dr. Lanell Matar [address] [place], [place] [address] [phone]

## 2019-05-07 MED FILL — OMEPRAZOLE 40 MG CPDR: 40 | 30 days supply | Qty: 30 | Fill #2

## 2019-05-07 MED FILL — SYMTUZA 800-150-200-10 MG T: 800-150-200 | 30 days supply | Qty: 30 | Fill #2

## 2019-05-07 MED FILL — SULFAMETHOXAZOLE-TMP DS TAB: 800-160 | 30 days supply | Qty: 30 | Fill #2

## 2019-06-13 ENCOUNTER — Other Ambulatory Visit: Payer: Self-pay | Admitting: Family

## 2019-06-13 DIAGNOSIS — B20 Human immunodeficiency virus [HIV] disease: Secondary | ICD-10-CM

## 2019-06-13 MED ORDER — SYMTUZA 800-150-200-10 MG PO TABS: 1.0000 | ORAL_TABLET | Freq: Every day | ORAL | 0 refills | Status: DC

## 2019-06-13 NOTE — Telephone Encounter (Signed)
[facility] Patient Advocate Encounter  Spoke with [name] to coordinate the patient's medication refills for November. [place] will [facility] out next week [facility] Tuesday to the [facility]. The church office hours are Tuesday-Friday 9:00am-1:00pm. Address has been confirmed by Jeno's brother and he has also given demographic information to confirm on future refill calls. He is overdue for his 59-month follow-up appointment.

## 2019-06-16 MED FILL — SYMTUZA 800-150-200-10 MG T: 800-150-200 | 30 days supply | Qty: 30 | Fill #0

## 2019-07-21 ENCOUNTER — Other Ambulatory Visit: Payer: Self-pay

## 2019-07-21 ENCOUNTER — Ambulatory Visit (INDEPENDENT_AMBULATORY_CARE_PROVIDER_SITE_OTHER): Payer: Medicaid Other

## 2019-07-21 ENCOUNTER — Other Ambulatory Visit: Payer: Medicaid Other

## 2019-07-21 DIAGNOSIS — Z79899 Other long term (current) drug therapy: Secondary | ICD-10-CM

## 2019-07-21 DIAGNOSIS — Z113 Encounter for screening for infections with a predominantly sexual mode of transmission: Secondary | ICD-10-CM

## 2019-07-21 DIAGNOSIS — B20 Human immunodeficiency virus [HIV] disease: Secondary | ICD-10-CM

## 2019-07-21 DIAGNOSIS — Z23 Encounter for immunization: Secondary | ICD-10-CM | POA: Diagnosis not present

## 2019-07-21 MED ORDER — SYMTUZA 800-150-200-10 MG PO TABS: 1.0000 | ORAL_TABLET | Freq: Every day | ORAL | 0 refills | Status: DC

## 2019-07-21 MED FILL — SYMTUZA 800-150-200-10 MG T: 800-150-200 | 30 days supply | Qty: 30 | Fill #0

## 2019-07-21 NOTE — Addendum Note (Signed)
Addended by: Dolan Amen D on: 07/21/2019 11:08 AM   Modules accepted: Orders

## 2019-07-21 NOTE — Telephone Encounter (Signed)
Patient arrived in office today requesting flu shot. Noted patient also needed medication refills, labs, and provider visit.  Labs obtained today and f/u visit provided in 2 weeks to see Terri Piedra, NP Eugenia Mcalpine

## 2019-07-21 NOTE — Telephone Encounter (Signed)
[facility] Patient Advocate Encounter  Coordinated his December refill to be shipped today via [employer] and arrive to his brother's church Tuesday. I informed him to go by Wednesday between 9:00-1:00pm and he understood. He has an office visit 12/28.

## 2019-07-22 LAB — T-HELPER CELL (CD4) - (RCID CLINIC ONLY)
CD4 % Helper T Cell: 17 % — ABNORMAL LOW (ref 33–65)
CD4 T Cell Abs: 201 /uL — ABNORMAL LOW (ref 400–1790)

## 2019-07-24 LAB — LIPID PANEL
Cholesterol: 170 mg/dL (ref <200)
HDL: 69 mg/dL (ref >=40)
LDL Cholesterol (Calc): 75 mg/dL (calc)
Non-HDL Cholesterol (Calc): 101 mg/dL (calc) (ref <130)
Total CHOL/HDL Ratio: 2.5 (calc) (ref <5.0)
Triglycerides: 166 mg/dL — ABNORMAL HIGH (ref <150)

## 2019-07-24 LAB — COMPREHENSIVE METABOLIC PANEL
AG Ratio: 1 (calc) (ref 1.0–2.5)
ALT: 40 U/L (ref 9–46)
AST: 49 U/L — ABNORMAL HIGH (ref 10–35)
Albumin: 4.1 g/dL (ref 3.6–5.1)
Alkaline phosphatase (APISO): 50 U/L (ref 35–144)
BUN: 17 mg/dL (ref 7–25)
CO2: 24 mmol/L (ref 20–32)
Calcium: 9.3 mg/dL (ref 8.6–10.3)
Chloride: 101 mmol/L (ref 98–110)
Creat: 1.02 mg/dL (ref 0.70–1.25)
Globulin: 4.2 g/dL (calc) — ABNORMAL HIGH (ref 1.9–3.7)
Glucose, Bld: 90 mg/dL (ref 65–99)
Potassium: 4.8 mmol/L (ref 3.5–5.3)
Sodium: 138 mmol/L (ref 135–146)
Total Bilirubin: 0.7 mg/dL (ref 0.2–1.2)
Total Protein: 8.3 g/dL — ABNORMAL HIGH (ref 6.1–8.1)

## 2019-07-24 LAB — CBC WITH DIFFERENTIAL/PLATELET
Absolute Monocytes: 634 cells/uL (ref 200–950)
Basophils Absolute: 40 cells/uL (ref 0–200)
Basophils Relative: 0.9 %
Eosinophils Absolute: 158 cells/uL (ref 15–500)
Eosinophils Relative: 3.6 %
HCT: 44.3 % (ref 38.5–50.0)
Hemoglobin: 14.6 g/dL (ref 13.2–17.1)
Lymphs Abs: 1263 cells/uL (ref 850–3900)
MCH: 30 pg (ref 27.0–33.0)
MCHC: 33 g/dL (ref 32.0–36.0)
MCV: 91.2 fL (ref 80.0–100.0)
MPV: 9.1 fL (ref 7.5–12.5)
Monocytes Relative: 14.4 %
Neutro Abs: 2306 cells/uL (ref 1500–7800)
Neutrophils Relative %: 52.4 %
Platelets: 279 10*3/uL (ref 140–400)
RBC: 4.86 10*6/uL (ref 4.20–5.80)
RDW: 12.7 % (ref 11.0–15.0)
Total Lymphocyte: 28.7 %
WBC: 4.4 10*3/uL (ref 3.8–10.8)

## 2019-07-24 LAB — HIV-1 RNA QUANT-NO REFLEX-BLD
HIV 1 RNA Quant: 47 copies/mL — ABNORMAL HIGH
HIV-1 RNA Quant, Log: 1.67 Log copies/mL — ABNORMAL HIGH

## 2019-07-24 LAB — RPR: RPR Ser Ql: NONREACTIVE

## 2019-08-04 ENCOUNTER — Ambulatory Visit (INDEPENDENT_AMBULATORY_CARE_PROVIDER_SITE_OTHER): Payer: Medicaid Other | Admitting: Family

## 2019-08-04 ENCOUNTER — Other Ambulatory Visit: Payer: Self-pay

## 2019-08-04 ENCOUNTER — Encounter: Payer: Self-pay | Admitting: Family

## 2019-08-04 DIAGNOSIS — B20 Human immunodeficiency virus [HIV] disease: Secondary | ICD-10-CM

## 2019-08-04 DIAGNOSIS — Z0000 Encounter for general adult medical examination without abnormal findings: Secondary | ICD-10-CM | POA: Diagnosis not present

## 2019-08-04 DIAGNOSIS — K219 Gastro-esophageal reflux disease without esophagitis: Secondary | ICD-10-CM

## 2019-08-04 MED ORDER — SYMTUZA 800-150-200-10 MG PO TABS: 1.0000 | ORAL_TABLET | Freq: Every day | ORAL | 3 refills | Status: DC

## 2019-08-04 MED ORDER — OMEPRAZOLE 40 MG PO CPDR: 40.0000 mg | DELAYED_RELEASE_CAPSULE | Freq: Every day | ORAL | 3 refills | Status: DC

## 2019-08-04 MED ORDER — ONDANSETRON HCL 4 MG PO TABS: 4.0000 mg | ORAL_TABLET | Freq: Every day | ORAL | 3 refills | Status: DC

## 2019-08-04 NOTE — Assessment & Plan Note (Signed)
   Vaccinations up-to-date per recommendations.  Discussed importance of safe sexual practice to reduce risk of STI.  Food and Christmas bag provided.

## 2019-08-04 NOTE — Patient Instructions (Addendum)
Nice to see you.  Please continue to take your Symtuza as prescribed.  Refills of medication of been sent to the pharmacy.  I have sent refills for your ondansetron and omeprazole as well.  Plan for follow-up in 3 months or sooner if needed with lab work 1 to 2 weeks prior to appointment.  Feel free to schedule with Dr. [name] or Marya Amsler
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
## 2019-08-04 NOTE — Assessment & Plan Note (Signed)
Mr. Harnage appears to have well-controlled HIV disease with good adherence and tolerance to his ART regimen of Symtuza.  No signs/symptoms of opportunistic infection or progressive HIV disease at present.  Continues to require ondansetron prior to Symtuza dosing.  Discussed blood work and reviewed plan of care.  Continue current dose of Symtuza.  No indication for Bactrim at the present time as his CD4 count has been greater than 200 for 3+ months.  Encouraged continued adherence with Symtuza.  Plan for follow-up in 3 months or sooner if needed

## 2019-09-29 MED FILL — SYMTUZA 800-150-200-10 MG T: 800-150-200 | 30 days supply | Qty: 30 | Fill #0

## 2019-10-06 ENCOUNTER — Other Ambulatory Visit: Payer: Self-pay

## 2019-10-06 ENCOUNTER — Ambulatory Visit (HOSPITAL_COMMUNITY)
Admission: EM | Admit: 2019-10-06 | Discharge: 2019-10-06 | Disposition: A | Discharge status: Home / Self Care | Payer: Medicare Other

## 2019-10-06 ENCOUNTER — Encounter (HOSPITAL_COMMUNITY): Payer: Self-pay

## 2019-10-06 ENCOUNTER — Telehealth: Payer: Self-pay

## 2019-10-06 DIAGNOSIS — B379 Candidiasis, unspecified: Secondary | ICD-10-CM

## 2019-10-06 DIAGNOSIS — R21 Rash and other nonspecific skin eruption: Secondary | ICD-10-CM

## 2019-10-06 MED ORDER — CLOTRIMAZOLE 1 % EX CREA: TOPICAL_CREAM | CUTANEOUS | 0 refills | Status: DC

## 2019-10-06 MED ORDER — FLUCONAZOLE 200 MG PO TABS: 200.0000 mg | ORAL_TABLET | Freq: Once | ORAL | 0 refills | Status: AC

## 2019-10-06 NOTE — Telephone Encounter (Signed)
Agree with plan 

## 2019-10-06 NOTE — Discharge Instructions (Addendum)
You likely have a yeast infection. I have sent in oral and topical medications for this. If you are able to get the pill, I would rather you take that. The cream will be helpful to the affected area.   Follow up with Dr [name] as needed.

## 2019-10-06 NOTE — ED Triage Notes (Signed)
Pt is here with rectal bleeding & abscess on his buttocks that started a month ago. Pt has not taken any meds to relieve discomfort.

## 2019-10-06 NOTE — Telephone Encounter (Signed)
Patient walked into clinic today requesting an appointment today. States that he is having rectal bleeding and pain. Advised patient to go to ED or Urgent Care to be evaluated. Patient verbalized understanding. [name]
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
## 2019-10-27 MED FILL — SYMTUZA 800-150-200-10 MG T: 800-150-200 | 30 days supply | Qty: 30 | Fill #1

## 2019-11-03 ENCOUNTER — Other Ambulatory Visit: Payer: Self-pay | Admitting: *Deleted

## 2019-11-03 ENCOUNTER — Other Ambulatory Visit: Payer: Medicaid Other

## 2019-11-03 DIAGNOSIS — B20 Human immunodeficiency virus [HIV] disease: Secondary | ICD-10-CM

## 2019-11-20 MED FILL — SYMTUZA 800-150-200-10 MG T: 800-150-200 | 30 days supply | Qty: 30 | Fill #2

## 2019-11-24 ENCOUNTER — Encounter: Payer: Medicaid Other | Admitting: Family

## 2020-02-24 ENCOUNTER — Emergency Department (HOSPITAL_COMMUNITY)
Admission: EM | Admit: 2020-02-24 | Discharge: 2020-02-24 | Disposition: A | Discharge status: Home / Self Care | Payer: Medicare Other | Attending: Emergency Medicine | Admitting: Emergency Medicine

## 2020-02-24 ENCOUNTER — Other Ambulatory Visit: Payer: Self-pay

## 2020-02-24 DIAGNOSIS — Z5321 Procedure and treatment not carried out due to patient leaving prior to being seen by health care provider: Secondary | ICD-10-CM | POA: Insufficient documentation

## 2020-02-24 DIAGNOSIS — K625 Hemorrhage of anus and rectum: Secondary | ICD-10-CM | POA: Diagnosis present

## 2020-02-24 LAB — COMPREHENSIVE METABOLIC PANEL
ALT: 47 U/L — ABNORMAL HIGH (ref 0–44)
AST: 63 U/L — ABNORMAL HIGH (ref 15–41)
Albumin: 3.9 g/dL (ref 3.5–5.0)
Alkaline Phosphatase: 66 U/L (ref 38–126)
Anion gap: 12 (ref 5–15)
BUN: 14 mg/dL (ref 8–23)
CO2: 23 mmol/L (ref 22–32)
Calcium: 9.1 mg/dL (ref 8.9–10.3)
Chloride: 106 mmol/L (ref 98–111)
Creatinine, Ser: 1.05 mg/dL (ref 0.61–1.24)
GFR calc Af Amer: >60 mL/min (ref >60)
GFR calc non Af Amer: >60 mL/min (ref >60)
Glucose, Bld: 103 mg/dL — ABNORMAL HIGH (ref 70–99)
Potassium: 3.9 mmol/L (ref 3.5–5.1)
Sodium: 141 mmol/L (ref 135–145)
Total Bilirubin: 0.6 mg/dL (ref 0.3–1.2)
Total Protein: 8.3 g/dL — ABNORMAL HIGH (ref 6.5–8.1)

## 2020-02-24 LAB — TYPE AND SCREEN
ABO/RH(D): O NEG
Antibody Screen: NEGATIVE

## 2020-02-24 LAB — CBC
HCT: 48.9 % (ref 39.0–52.0)
Hemoglobin: 15 g/dL (ref 13.0–17.0)
MCH: 28.8 pg (ref 26.0–34.0)
MCHC: 30.7 g/dL (ref 30.0–36.0)
MCV: 93.9 fL (ref 80.0–100.0)
Platelets: 278 10*3/uL (ref 150–400)
RBC: 5.21 MIL/uL (ref 4.22–5.81)
RDW: 16.1 % — ABNORMAL HIGH (ref 11.5–15.5)
WBC: 5.5 10*3/uL (ref 4.0–10.5)
nRBC: 0 % (ref 0.0–0.2)

## 2020-02-24 NOTE — ED Triage Notes (Signed)
Pt arrived ambulatory into ED CC rectal bleeding X 1 month. Pt reports " hurts worse when I use the bathroom and I have been taking Asprin for pain, I hope its just my Hemorids but I am trying to make sure it isn't cancer". Pt A/OX4 VSS   Hx HIV and " rectal cancer"

## 2020-04-21 ENCOUNTER — Telehealth: Payer: Self-pay

## 2020-04-21 ENCOUNTER — Other Ambulatory Visit: Payer: Self-pay

## 2020-04-21 ENCOUNTER — Ambulatory Visit: Payer: Medicare Other

## 2020-04-21 ENCOUNTER — Ambulatory Visit (INDEPENDENT_AMBULATORY_CARE_PROVIDER_SITE_OTHER): Payer: Medicare Other | Admitting: Infectious Disease

## 2020-04-21 ENCOUNTER — Encounter: Payer: Self-pay | Admitting: Infectious Disease

## 2020-04-21 ENCOUNTER — Other Ambulatory Visit (HOSPITAL_COMMUNITY)
Admission: RE | Admit: 2020-04-21 | Discharge: 2020-04-21 | Disposition: A | Discharge status: Home / Self Care | Payer: Medicare Other | Source: Ambulatory Visit | Attending: Infectious Disease | Admitting: Infectious Disease

## 2020-04-21 VITALS — BP 186/93 | HR 80 | Temp 98.1°F

## 2020-04-21 DIAGNOSIS — B20 Human immunodeficiency virus [HIV] disease: Secondary | ICD-10-CM | POA: Insufficient documentation

## 2020-04-21 DIAGNOSIS — B459 Cryptococcosis, unspecified: Secondary | ICD-10-CM | POA: Insufficient documentation

## 2020-04-21 DIAGNOSIS — Z23 Encounter for immunization: Secondary | ICD-10-CM

## 2020-04-21 DIAGNOSIS — C210 Malignant neoplasm of anus, unspecified: Secondary | ICD-10-CM

## 2020-04-21 DIAGNOSIS — Z923 Personal history of irradiation: Secondary | ICD-10-CM

## 2020-04-21 DIAGNOSIS — I10 Essential (primary) hypertension: Secondary | ICD-10-CM

## 2020-04-21 DIAGNOSIS — C801 Malignant (primary) neoplasm, unspecified: Secondary | ICD-10-CM | POA: Insufficient documentation

## 2020-04-21 DIAGNOSIS — K625 Hemorrhage of anus and rectum: Secondary | ICD-10-CM | POA: Insufficient documentation

## 2020-04-21 DIAGNOSIS — K645 Perianal venous thrombosis: Secondary | ICD-10-CM

## 2020-04-21 MED ORDER — SYMTUZA 800-150-200-10 MG PO TABS: 1.0000 | ORAL_TABLET | Freq: Every day | ORAL | 3 refills | Status: DC

## 2020-04-21 NOTE — Progress Notes (Signed)
   Covid-19 Vaccination Clinic  Name:  [name]    MRN: [id number] DOB: 1956-06-20  04/21/2020  [name] was observed post Covid-19 immunization for 15 minutes without incident. He was provided with Vaccine Information Sheet and instruction to access the V-Safe system.   [name] was instructed to call 911 with any severe reactions post vaccine: Marland Kitchen Difficulty breathing  . Swelling of face and throat  . A fast heartbeat  . A bad rash all over body  . Dizziness and weakness   Immunizations Administered    Name Date Dose VIS Date Route   Pfizer COVID-19 Vaccine 04/21/2020 11:22 AM 0.3 mL 10/01/2018 Intramuscular   Manufacturer: [place]   Lot: [id number]   [place]: [place]

## 2020-04-21 NOTE — Telephone Encounter (Signed)
Patient walked into office today asking to speak with a nurse regarding his medications. Patient states he has not taken his medication in over a month and he is worries. Patient also states he now is having some rectal pain and bleeding. Patient has not been seen in the office since Dec 2020. Spoke with Dr. [name] and he agrees to see patient today. Patient added to open schedule. Eugenia Mcalpine

## 2020-04-21 NOTE — Progress Notes (Signed)
Chief complaint: Rectal bleeding  Subjective:    Patient ID: [name], male    DOB: [birth year], 64 y.o.   MRN: [id number]  Rectal Bleeding  Associated symptoms include rectal pain. Pertinent negatives include no fever, no nausea, no vomiting, no hematuria, no chest pain and no coughing.    [name] is 64 year old with HIV/AIDS, prior cryptococcal meningitis with IRIS who has intermittently been adherent to antiretroviral medications.  He also has a history of rectal cancer and underwent radiation therapy.  I have not myself seen since [place] since April 2019 he was seen in the interim by Dr. Johnnye Sima and also Terri Piedra on several occasions and October 2019 June 2020 in July 2020 as well as December 2020.  Since then he has not been seen.  Last remote fill for Symtuza was in December with 3 refills in 2020.  He claims though that he had some left and only ran out a month ago.  He presents to clinic today for labs and complaining of rectal bleeding.  He tells me that he was seen in urgent care and told that it was a yeast infection.  Looking through the records it appears that [name] came to the urgent care on March 1 with complaints of rectal bleeding and a perianal rash.  The rash was thought to be due to yeast.  He was instructed to go to the ER if he had worsening symptoms and to follow-up with me which he did not do.  Since then apparently has had ongoing bleeding of red blood from the rectum as well as pruritus.  On exam today he was quite tender to digital rectal exam.  I could not palpate a mass but clearly given his history of rectal cancer and the likelihood that he does not been on his antiviral regimen there is concern for recurrent rectal cancer.       Lab Results  Component Value Date   HIV1RNAQUANT 47 (H) 07/21/2019   HIV1RNAQUANT 61 (H) 01/15/2019   HIV1RNAQUANT 27 (H) 10/24/2018   Lab Results  Component Value Date   CD4TABS 201 (L) 07/21/2019   CD4TABS 278  (L) 01/15/2019   CD4TABS 230 (L) 10/24/2018   He does complain of a rash on his neck arms and legs.  According to med she brought him to clinic today the patient is exposed to all sorts of bugs and insects due to poor sanitation in his living conditions.  He has just suffered the loss of his best friend [name] who is also my patient who suffered from HIV AIDS and died from cryptococcal meningitis in the last few weeks.  I gave [name] a hug and talk to him at length about his loss and grieving.  He did not want to see a counselor today and says he is talking to a woman who is a friend of his.   Past Medical History:  Diagnosis Date  . Acute kidney failure    unspec, secondary to medications  . AIDS ([place]) 06/07/2016  . Alcohol abuse 05/03/2016  . anal ca dx'd 02/2011  . Cachexia ([place])   . 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 Drug abuse ([place])   . GERD (gastroesophageal reflux disease) 08/09/2016  . Grieving 11/29/2017  . Hemorrhoid   . Hepatitis C   . History of radiation therapy 01/01/2011 -05/18/11   anal, pelvis, inguinal lymph nodes  . HIV (human immunodeficiency virus infection) ([place])   . HIV or AIDS   .  Hypertension   . Loose stools 06/07/2016  . Mass of anus 01/26/2011   squamous cell cancer  . Meningitis   . Mood disorder ([place])   . Pain    right leg  . Pulmonary nodule   . Rash and nonspecific skin eruption 03/31/2015  . Testicular hypofunction     Past Surgical History:  Procedure Laterality Date  . MASS EXCISION  01/26/2011   squamous cell carcinoma    Family History  Family history unknown: Yes      Social History   Socioeconomic History  . Marital status: Single    Spouse name: Not on file  . Number of children: Not on file  . Years of education: Not on file  . Highest education level: Not on file  Occupational History  . Not on file  Tobacco Use  . Smoking status: Current Every Day Smoker  . Smokeless tobacco: Never Used  . Tobacco comment: 2-3 day  Vaping Use  .  Vaping Use: Never used  Substance and Sexual Activity  . Alcohol use: Yes    Alcohol/week: 6.0 standard drinks    Types: 6 Cans of beer per week  . Drug use: Not Currently  . Sexual activity: Not Currently    Partners: Male    Birth control/protection: Condom    Comment: given condoms  Other Topics Concern  . Not on file  Social History Narrative   ** Merged History Encounter **       Social Determinants of Health   Financial Resource Strain:   . Difficulty of Paying Living Expenses: Not on file  Food Insecurity:   . Worried About Charity fundraiser in the Last Year: Not on file  . Ran Out of Food in the Last Year: Not on file  Transportation Needs:   . Lack of Transportation (Medical): Not on file  . Lack of Transportation (Non-Medical): Not on file  Physical Activity:   . Days of Exercise per Week: Not on file  . Minutes of Exercise per Session: Not on file  Stress:   . Feeling of Stress : Not on file  Social Connections:   . Frequency of Communication with Friends and Family: Not on file  . Frequency of Social Gatherings with Friends and Family: Not on file  . Attends Religious Services: Not on file  . Active Member of Clubs or Organizations: Not on file  . Attends Archivist Meetings: Not on file  . Marital Status: Not on file    No Known Allergies   Current Outpatient Medications:  .  clotrimazole (LOTRIMIN) 1 % cream, Apply to affected area 2 times daily, Disp: 15 g, Rfl: 0 .  Darunavir-Cobicisctat-Emtricitabine-Tenofovir Alafenamide (SYMTUZA) 800-150-200-10 MG TABS, Take 1 tablet by mouth daily with breakfast., Disp: 30 tablet, Rfl: 3 .  omeprazole (PRILOSEC) 40 MG capsule, Take 1 capsule (40 mg total) by mouth daily., Disp: 30 capsule, Rfl: 3 .  ondansetron (ZOFRAN) 4 MG tablet, Take 1 tablet (4 mg total) by mouth daily. Take before symtuza, Disp: 30 tablet, Rfl: 3 .  sulfamethoxazole-trimethoprim (BACTRIM DS) 800-160 MG tablet, , Disp: , Rfl:     Review of Systems  Constitutional: Negative for activity change, appetite change, chills, diaphoresis, fatigue, fever and unexpected weight change.  HENT: Negative for congestion, rhinorrhea, sinus pressure, sneezing, sore throat and trouble swallowing.   Eyes: Negative for photophobia and visual disturbance.  Respiratory: Negative for cough, chest tightness, shortness of breath, wheezing and stridor.  Cardiovascular: Negative for chest pain, palpitations and leg swelling.  Gastrointestinal: Positive for anal bleeding, blood in stool, hematochezia and rectal pain. Negative for abdominal distention, constipation, nausea and vomiting.  Genitourinary: Negative for difficulty urinating, dysuria, flank pain and hematuria.  Musculoskeletal: Negative for arthralgias, back pain, gait problem, joint swelling and myalgias.  Skin: Negative for color change, pallor and wound.  Neurological: Negative for dizziness, tremors, weakness and light-headedness.  Hematological: Negative for adenopathy. Does not bruise/bleed easily.  Psychiatric/Behavioral: Negative for agitation, behavioral problems, confusion, decreased concentration, dysphoric mood and sleep disturbance.       Objective:   Physical Exam Constitutional:      Appearance: He is well-developed.  HENT:     Head: Normocephalic and atraumatic.  Eyes:     Conjunctiva/sclera: Conjunctivae normal.  Cardiovascular:     Rate and Rhythm: Normal rate and regular rhythm.  Pulmonary:     Effort: Pulmonary effort is normal. No respiratory distress.     Breath sounds: No wheezing.  Abdominal:     General: There is no distension.     Palpations: Abdomen is soft.     Tenderness: There is no rebound.  Genitourinary:    Rectum: Guaiac result positive. Tenderness and anal fissure present.  Musculoskeletal:        General: No tenderness. Normal range of motion.     Cervical back: Normal range of motion and neck supple.  Skin:    General: Skin is  warm and dry.     Coloration: Skin is not pale.     Findings: No erythema.  Neurological:     Mental Status: He is alert and oriented to person, place, and time.  Psychiatric:        Attention and Perception: Attention normal.        Speech: Speech normal.        Behavior: Behavior is cooperative.        Thought Content: Thought content normal.        Cognition and Memory: Cognition normal.               Assessment & Plan:   Rectal bleeding: I am concerned that he may indeed have rectal cancer again though he could also have a hemorrhoid he had thrombosed hemorrhoids in the past.  I wonder for him to [facility] [place] [facility] though I do not know if we may run into the issue that they are not doing HRA's again.  If they do not see him and he has to go through the loop of getting an HRA then he will need to go to [facility] infectious disease to get an HRA.  We will check a CBC with differential today certainly.  I am also checking gonorrhea chlamydia in the rectum as well as oropharynx and urine     HIV: Check labs today and Symtuza has been sent and he probably needs Bactrim as well.  Vaccination we will vaccinate with the first dose of Pfizer and also with a flu vaccine.  Chronic hepatitis C without hepatic coma: Would certainly like to treat this but we have not had engaged in care consecutively enough to be able to get him onto meds and get this cured.  History of alcoholism concerned that this may be an issue again.  HTN: some of this may be related to pain today   I spent greater than 40 minutes with the patient including greater than 50% of time in face to face counsel  of the patient re workup for his rectal bleeding, need for vaccines, need to be on his ARV and in care and in  coordination of his care.

## 2020-04-22 LAB — CYTOLOGY, (ORAL, ANAL, URETHRAL) ANCILLARY ONLY
Chlamydia: NEGATIVE
Chlamydia: NEGATIVE
Comment: NEGATIVE
Comment: NEGATIVE
Comment: NORMAL
Comment: NORMAL
Neisseria Gonorrhea: NEGATIVE
Neisseria Gonorrhea: NEGATIVE

## 2020-04-22 LAB — URINE CYTOLOGY ANCILLARY ONLY
Chlamydia: NEGATIVE
Comment: NEGATIVE
Comment: NORMAL
Neisseria Gonorrhea: NEGATIVE

## 2020-04-22 LAB — T-HELPER CELL (CD4) - (RCID CLINIC ONLY)
CD4 % Helper T Cell: 22 % — ABNORMAL LOW (ref 33–65)
CD4 T Cell Abs: 231 /uL — ABNORMAL LOW (ref 400–1790)

## 2020-04-25 LAB — COMPLETE METABOLIC PANEL WITH GFR
AG Ratio: 1.1 (calc) (ref 1.0–2.5)
ALT: 39 U/L (ref 9–46)
AST: 50 U/L — ABNORMAL HIGH (ref 10–35)
Albumin: 4.1 g/dL (ref 3.6–5.1)
Alkaline phosphatase (APISO): 67 U/L (ref 35–144)
BUN: 18 mg/dL (ref 7–25)
CO2: 27 mmol/L (ref 20–32)
Calcium: 9 mg/dL (ref 8.6–10.3)
Chloride: 105 mmol/L (ref 98–110)
Creat: 0.87 mg/dL (ref 0.70–1.25)
GFR, Est African American: 105 mL/min/{1.73_m2} (ref >=60)
GFR, Est Non African American: 91 mL/min/{1.73_m2} (ref >=60)
Globulin: 3.6 g/dL (calc) (ref 1.9–3.7)
Glucose, Bld: 89 mg/dL (ref 65–99)
Potassium: 3.8 mmol/L (ref 3.5–5.3)
Sodium: 139 mmol/L (ref 135–146)
Total Bilirubin: 0.5 mg/dL (ref 0.2–1.2)
Total Protein: 7.7 g/dL (ref 6.1–8.1)

## 2020-04-25 LAB — CBC WITH DIFFERENTIAL/PLATELET
Absolute Monocytes: 796 cells/uL (ref 200–950)
Basophils Absolute: 51 cells/uL (ref 0–200)
Basophils Relative: 1.1 %
Eosinophils Absolute: 244 cells/uL (ref 15–500)
Eosinophils Relative: 5.3 %
HCT: 45.1 % (ref 38.5–50.0)
Hemoglobin: 14.6 g/dL (ref 13.2–17.1)
Lymphs Abs: 1067 cells/uL (ref 850–3900)
MCH: 29.3 pg (ref 27.0–33.0)
MCHC: 32.4 g/dL (ref 32.0–36.0)
MCV: 90.4 fL (ref 80.0–100.0)
MPV: 8.9 fL (ref 7.5–12.5)
Monocytes Relative: 17.3 %
Neutro Abs: 2443 cells/uL (ref 1500–7800)
Neutrophils Relative %: 53.1 %
Platelets: 285 10*3/uL (ref 140–400)
RBC: 4.99 10*6/uL (ref 4.20–5.80)
RDW: 12.3 % (ref 11.0–15.0)
Total Lymphocyte: 23.2 %
WBC: 4.6 10*3/uL (ref 3.8–10.8)

## 2020-04-25 LAB — HIV-1 RNA ULTRAQUANT REFLEX TO GENTYP+
HIV 1 RNA Quant: <20 copies/mL
HIV-1 RNA Quant, Log: <1.3 Log copies/mL

## 2020-04-25 LAB — RPR: RPR Ser Ql: NONREACTIVE

## 2020-05-14 ENCOUNTER — Ambulatory Visit: Payer: Medicare Other

## 2020-05-26 ENCOUNTER — Telehealth: Payer: Self-pay

## 2020-05-26 NOTE — Telephone Encounter (Signed)
Patient walked into office, states he has lost his [place] vaccine card and is requesting another other. Patient provided with card and bus passes for next upcoming appointment.  Eugenia Mcalpine

## 2020-06-02 ENCOUNTER — Emergency Department (HOSPITAL_COMMUNITY)
Admission: EM | Admit: 2020-06-02 | Discharge: 2020-06-02 | Disposition: A | Discharge status: Home / Self Care | Payer: Medicare Other | Attending: Emergency Medicine | Admitting: Emergency Medicine

## 2020-06-02 ENCOUNTER — Emergency Department (HOSPITAL_COMMUNITY): Payer: Medicare Other

## 2020-06-02 ENCOUNTER — Ambulatory Visit: Payer: Medicare Other | Admitting: Infectious Disease

## 2020-06-02 ENCOUNTER — Other Ambulatory Visit: Payer: Self-pay

## 2020-06-02 DIAGNOSIS — R079 Chest pain, unspecified: Secondary | ICD-10-CM | POA: Insufficient documentation

## 2020-06-02 DIAGNOSIS — R1010 Upper abdominal pain, unspecified: Secondary | ICD-10-CM | POA: Diagnosis not present

## 2020-06-02 DIAGNOSIS — F17200 Nicotine dependence, unspecified, uncomplicated: Secondary | ICD-10-CM | POA: Diagnosis not present

## 2020-06-02 DIAGNOSIS — I10 Essential (primary) hypertension: Secondary | ICD-10-CM | POA: Insufficient documentation

## 2020-06-02 DIAGNOSIS — K6289 Other specified diseases of anus and rectum: Secondary | ICD-10-CM | POA: Diagnosis not present

## 2020-06-02 LAB — CBC WITH DIFFERENTIAL/PLATELET
Abs Immature Granulocytes: 0.02 10*3/uL (ref 0.00–0.07)
Basophils Absolute: 0 10*3/uL (ref 0.0–0.1)
Basophils Relative: 1 %
Eosinophils Absolute: 0.1 10*3/uL (ref 0.0–0.5)
Eosinophils Relative: 2 %
HCT: 48.8 % (ref 39.0–52.0)
Hemoglobin: 16 g/dL (ref 13.0–17.0)
Immature Granulocytes: 1 %
Lymphocytes Relative: 20 %
Lymphs Abs: 0.8 10*3/uL (ref 0.7–4.0)
MCH: 29.7 pg (ref 26.0–34.0)
MCHC: 32.8 g/dL (ref 30.0–36.0)
MCV: 90.5 fL (ref 80.0–100.0)
Monocytes Absolute: 0.5 10*3/uL (ref 0.1–1.0)
Monocytes Relative: 12 %
Neutro Abs: 2.6 10*3/uL (ref 1.7–7.7)
Neutrophils Relative %: 64 %
Platelets: 277 10*3/uL (ref 150–400)
RBC: 5.39 MIL/uL (ref 4.22–5.81)
RDW: 13.8 % (ref 11.5–15.5)
WBC: 4 10*3/uL (ref 4.0–10.5)
nRBC: 0 % (ref 0.0–0.2)

## 2020-06-02 LAB — COMPREHENSIVE METABOLIC PANEL
ALT: 52 U/L — ABNORMAL HIGH (ref 0–44)
AST: 72 U/L — ABNORMAL HIGH (ref 15–41)
Albumin: 4.3 g/dL (ref 3.5–5.0)
Alkaline Phosphatase: 73 U/L (ref 38–126)
Anion gap: 15 (ref 5–15)
BUN: 15 mg/dL (ref 8–23)
CO2: 22 mmol/L (ref 22–32)
Calcium: 10 mg/dL (ref 8.9–10.3)
Chloride: 105 mmol/L (ref 98–111)
Creatinine, Ser: 1.1 mg/dL (ref 0.61–1.24)
GFR, Estimated: >60 mL/min (ref >60)
Glucose, Bld: 96 mg/dL (ref 70–99)
Potassium: 3.7 mmol/L (ref 3.5–5.1)
Sodium: 142 mmol/L (ref 135–145)
Total Bilirubin: 1.1 mg/dL (ref 0.3–1.2)
Total Protein: 9.4 g/dL — ABNORMAL HIGH (ref 6.5–8.1)

## 2020-06-02 LAB — LIPASE, BLOOD: Lipase: 30 U/L (ref 11–51)

## 2020-06-02 IMAGING — CT CT CHEST W/ CM
2 of 5 series · 12 of 36 positions shown, 15 images · IV contrast (omnipaque)
Comparison: PET-CT dated [DATE]

CLINICAL DATA: Weight loss, history of anorectal cancer, pulmonary
nodule

EXAM:
CT CHEST, ABDOMEN, AND PELVIS WITH CONTRAST
TECHNIQUE: Multidetector CT imaging of the chest, abdomen and pelvis was
performed following the standard protocol during bolus
administration of intravenous contrast.
CONTRAST:  100mL OMNIPAQUE IOHEXOL 300 MG/ML  SOLN

[Series 2: axial st · axial · 0.68mm/px · z∈[+700,+1288]mm · 9 of 334 slices shown, 12 images]
[im 20/334  mediastinal]
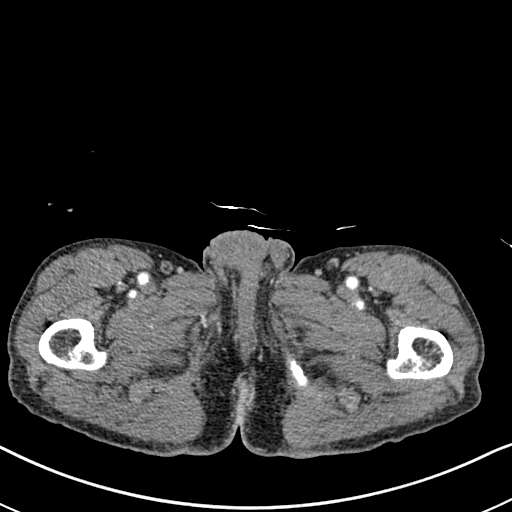
[im 20/334  lung]
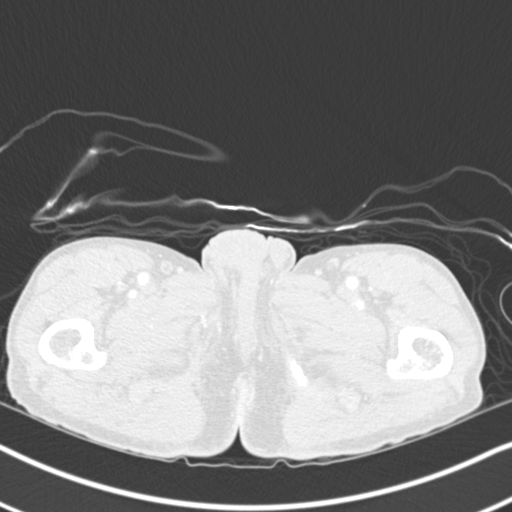
[im 59/334  lung]
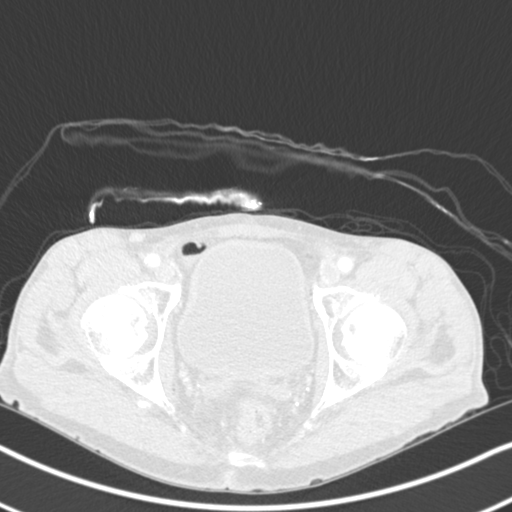
[im 98/334  lung]
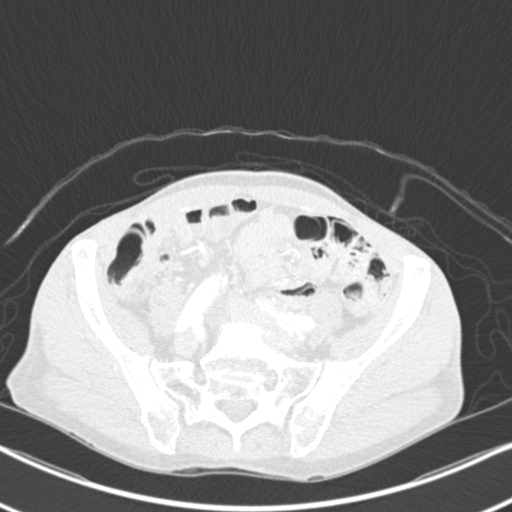
[im 138/334  lung]
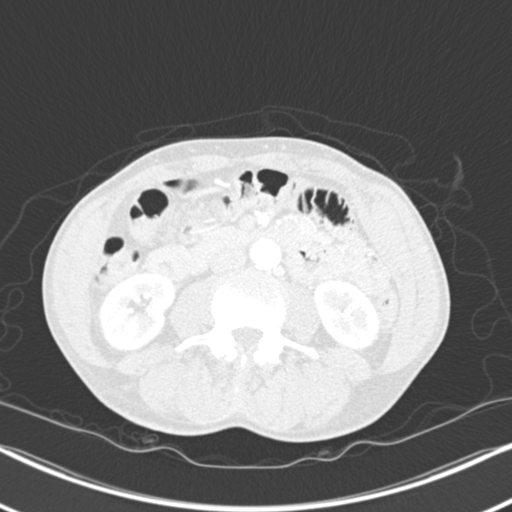
[im 177/334  mediastinal]
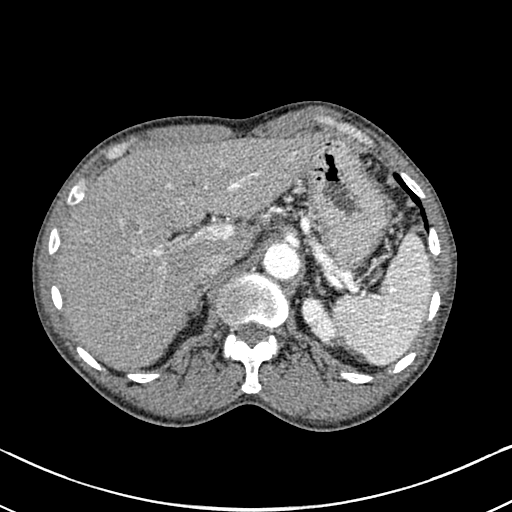
[im 177/334  lung]
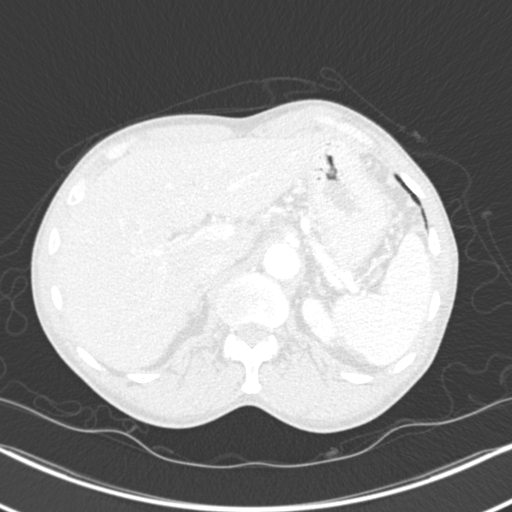
[im 196/334  lung]
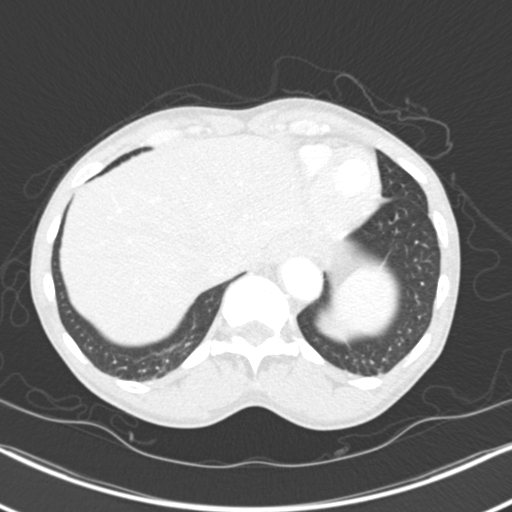
[im 236/334  lung]
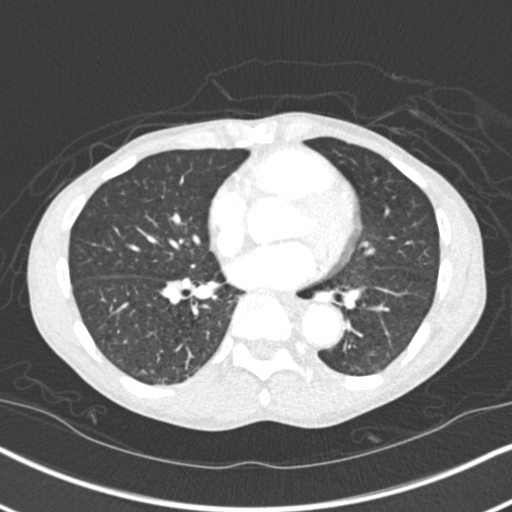
[im 275/334  lung]
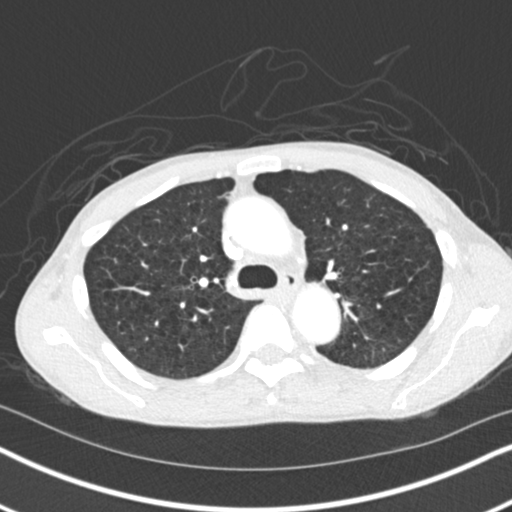
[im 314/334  mediastinal]
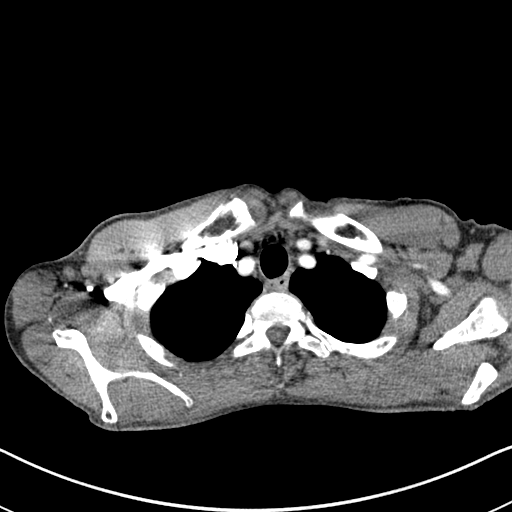
[im 314/334  lung]
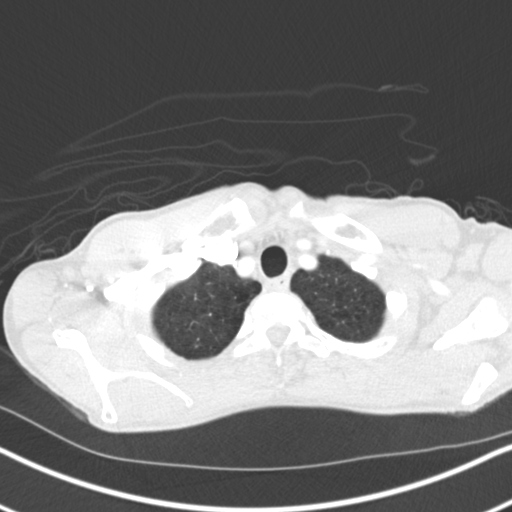

[Series 4: coronal · coronal · 0.71mm/px · 3 of 140 slices shown]
[im 28/140  lung]
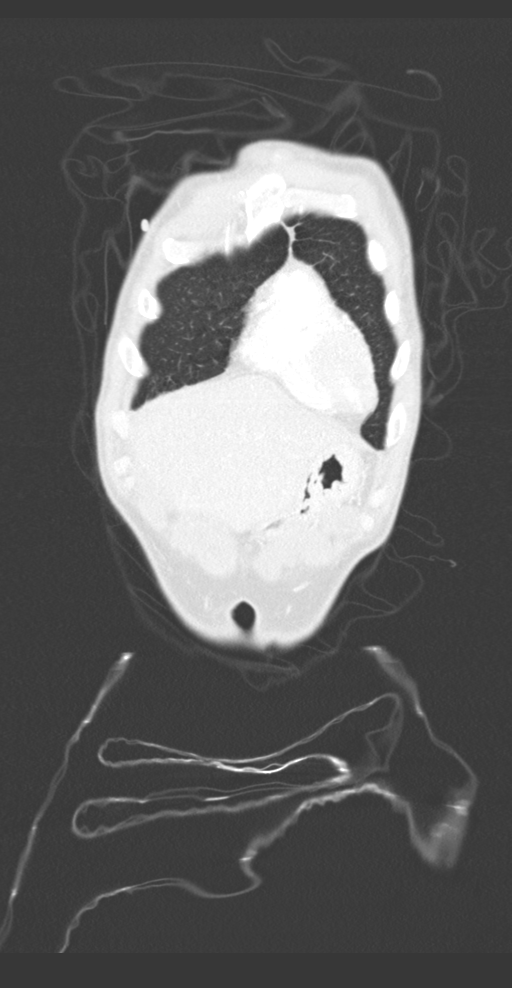
[im 56/140  lung]
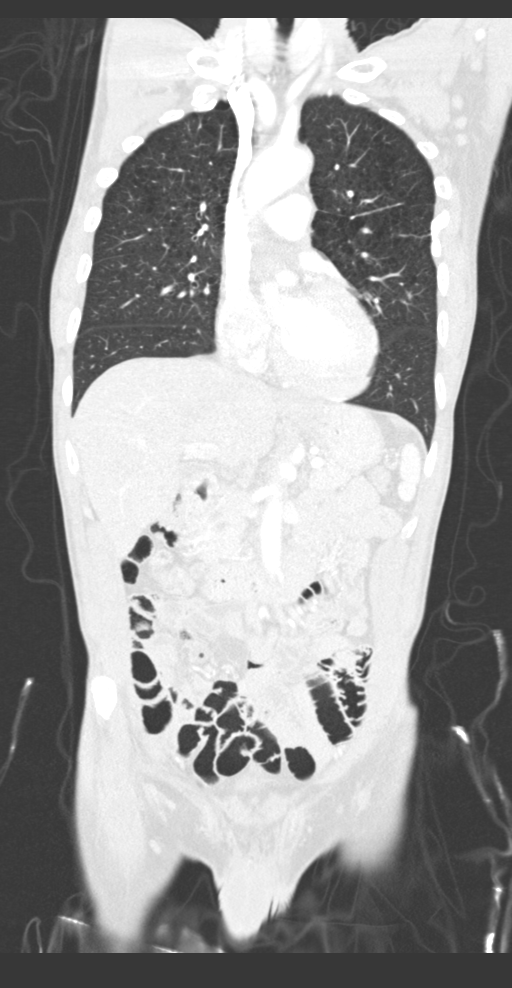
[im 84/140  lung]
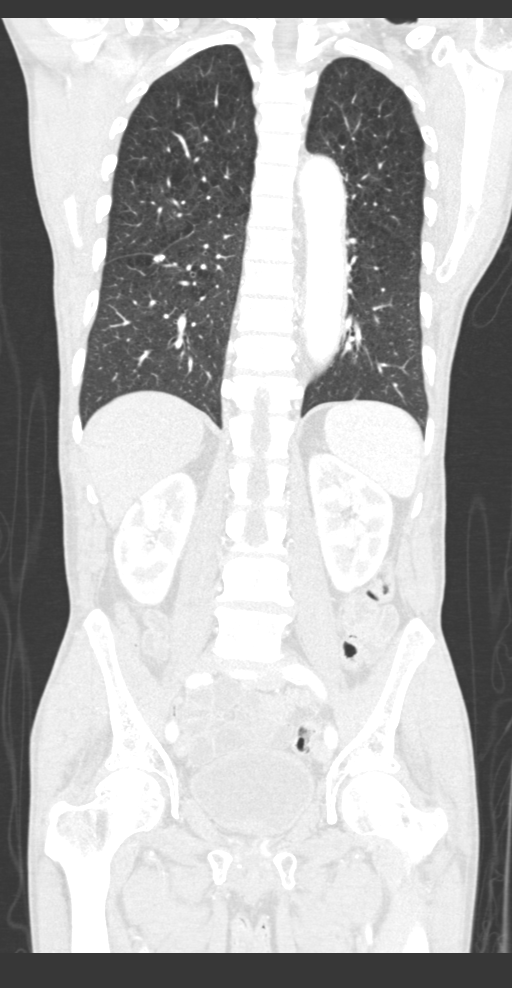

[12 of 36 positions shown; findings below may reference images not displayed]

FINDINGS: CT CHEST FINDINGS

Cardiovascular: The heart is normal in size. No pericardial
effusion.

No evidence of thoracic aortic aneurysm. Mild atherosclerotic
calcifications at the aortic arch.

Mild coronary atherosclerosis of the left circumflex.

Mediastinum/Nodes: No suspicious mediastinal lymphadenopathy.

Visualized thyroid is unremarkable.

Lungs/Pleura: Moderate centrilobular and paraseptal emphysematous
changes, upper lung predominant.

Mild platelike scarring in the lateral left upper lobe (series
7/image 77). No focal consolidation.

7 x 9 mm nodule in the superior segment right lower lobe (series
7/image 87), partially calcified, previously 8 x 12 mm without
calcification. Additional 8 x 11 mm nodule in the medial right lower
lobe (series 7/image 82), partially calcified, previously 13 x 18 mm
without calcification. These may reflect treated metastases.

No pleural effusion or pneumothorax.

Musculoskeletal: Visualized osseous structures are within normal
limits.

CT ABDOMEN PELVIS FINDINGS

Hepatobiliary: Liver is within normal limits. No
suspicious/enhancing hepatic lesions.

Gallbladder is unremarkable. No intrahepatic or extrahepatic ductal
dilatation.

Pancreas: Within normal limits.

Spleen: Within normal limits.

Adrenals/Urinary Tract: Adrenal glands are within normal limits.

Kidneys are within normal limits.  No hydronephrosis.

Mildly thick-walled bladder.

Stomach/Bowel: Stomach is within normal limits.

No evidence of bowel obstruction.

Appendix is not discretely visualized.

Mild sigmoid diverticulosis, without evidence of diverticulitis.

No colonic wall thickening or mass is seen. Specifically, the
anorectal region is within normal limits on CT.

Vascular/Lymphatic: No evidence of abdominal aortic aneurysm.

Atherosclerotic calcifications of the abdominal aorta and branch
vessels.

No suspicious abdominopelvic lymphadenopathy.

Reproductive: Prostate is unremarkable.

Other: No abdominopelvic ascites.

No frank peritoneal nodularity or omental caking.

Musculoskeletal: Mild degenerative changes at L5-S1.
IMPRESSION: No colonic wall thickening or mass is seen. Specifically, the
anorectal region is within normal limits on CT.

Two calcified right lower lobe nodules are improved from [84],
possibly reflecting treated metastases.

Otherwise, no evidence of recurrent or metastatic disease.

## 2020-06-02 IMAGING — DX DG CHEST 1V PORT
2 series · 2 of 2 positions shown · non-contrast
Comparison: Prior chest radiographs [DATE].

CLINICAL DATA: Chest pain. Additional history provided: Patient
with history of rectal cancer and new onset left-sided chest pain.

EXAM:
PORTABLE CHEST 1 VIEW

[chest ap (1 of 2)]
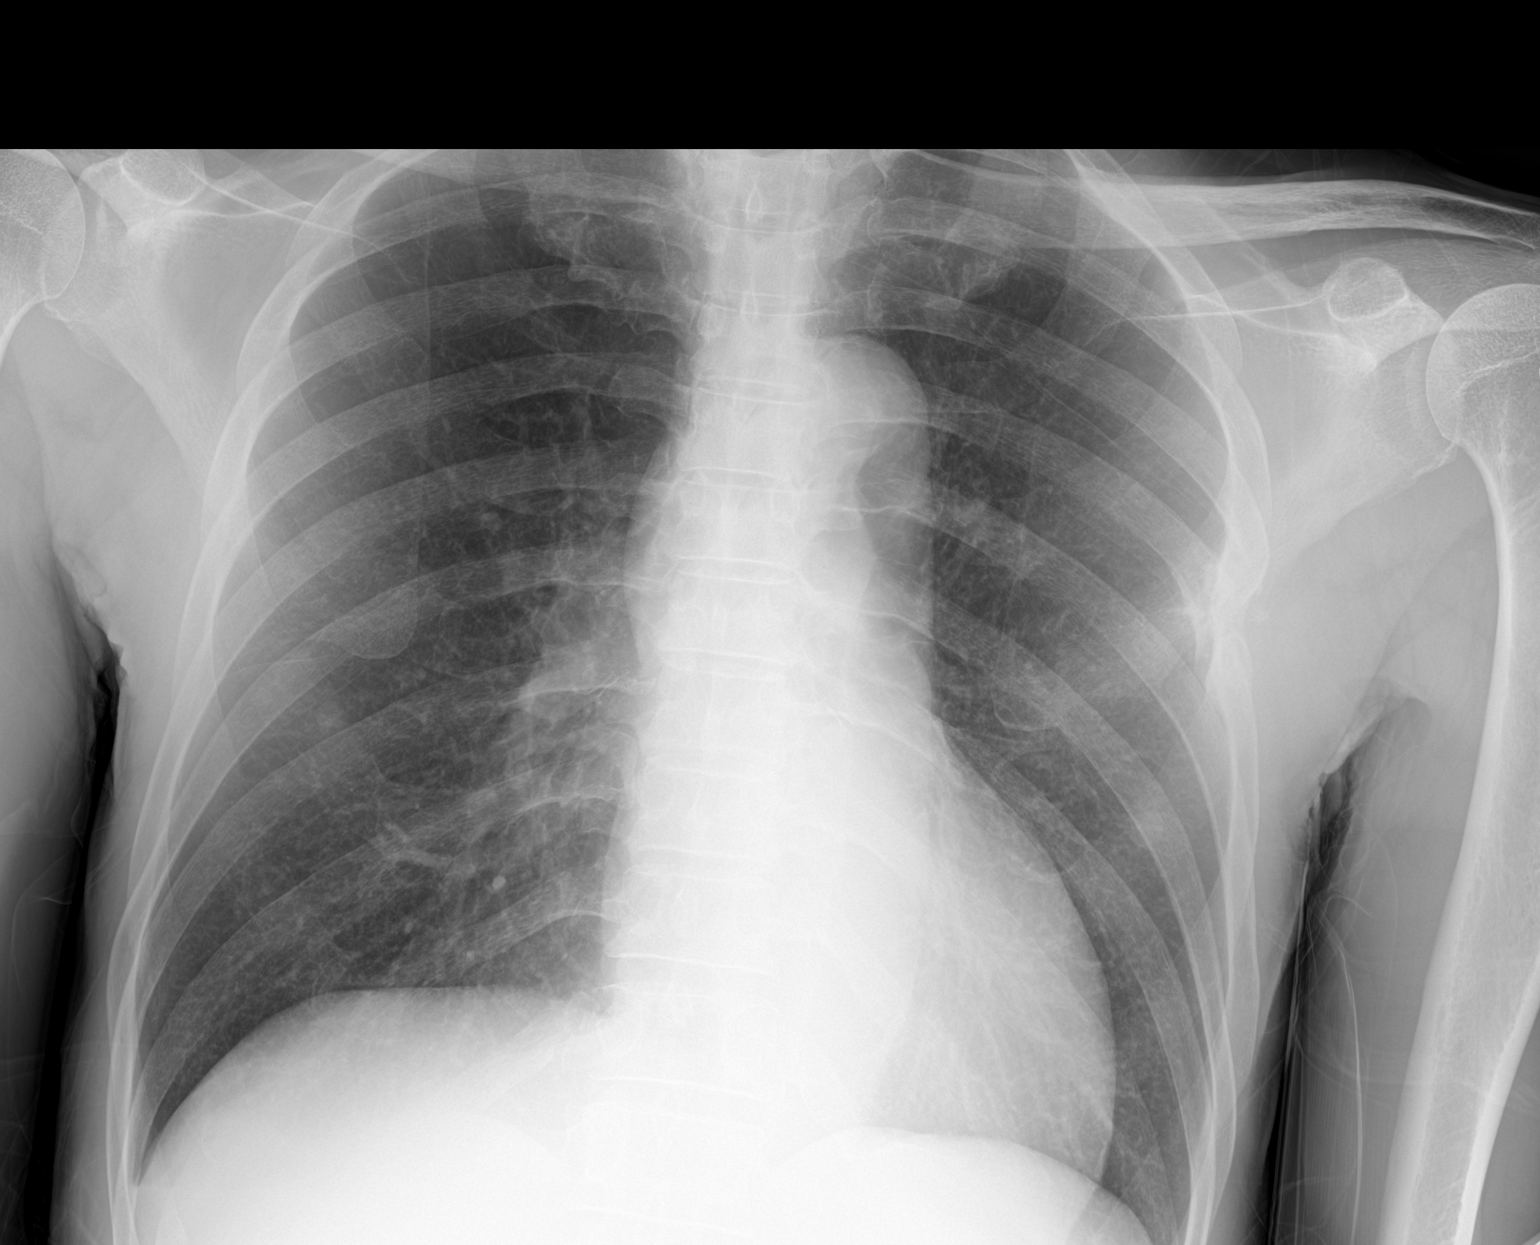

[chest ap (2 of 2)]
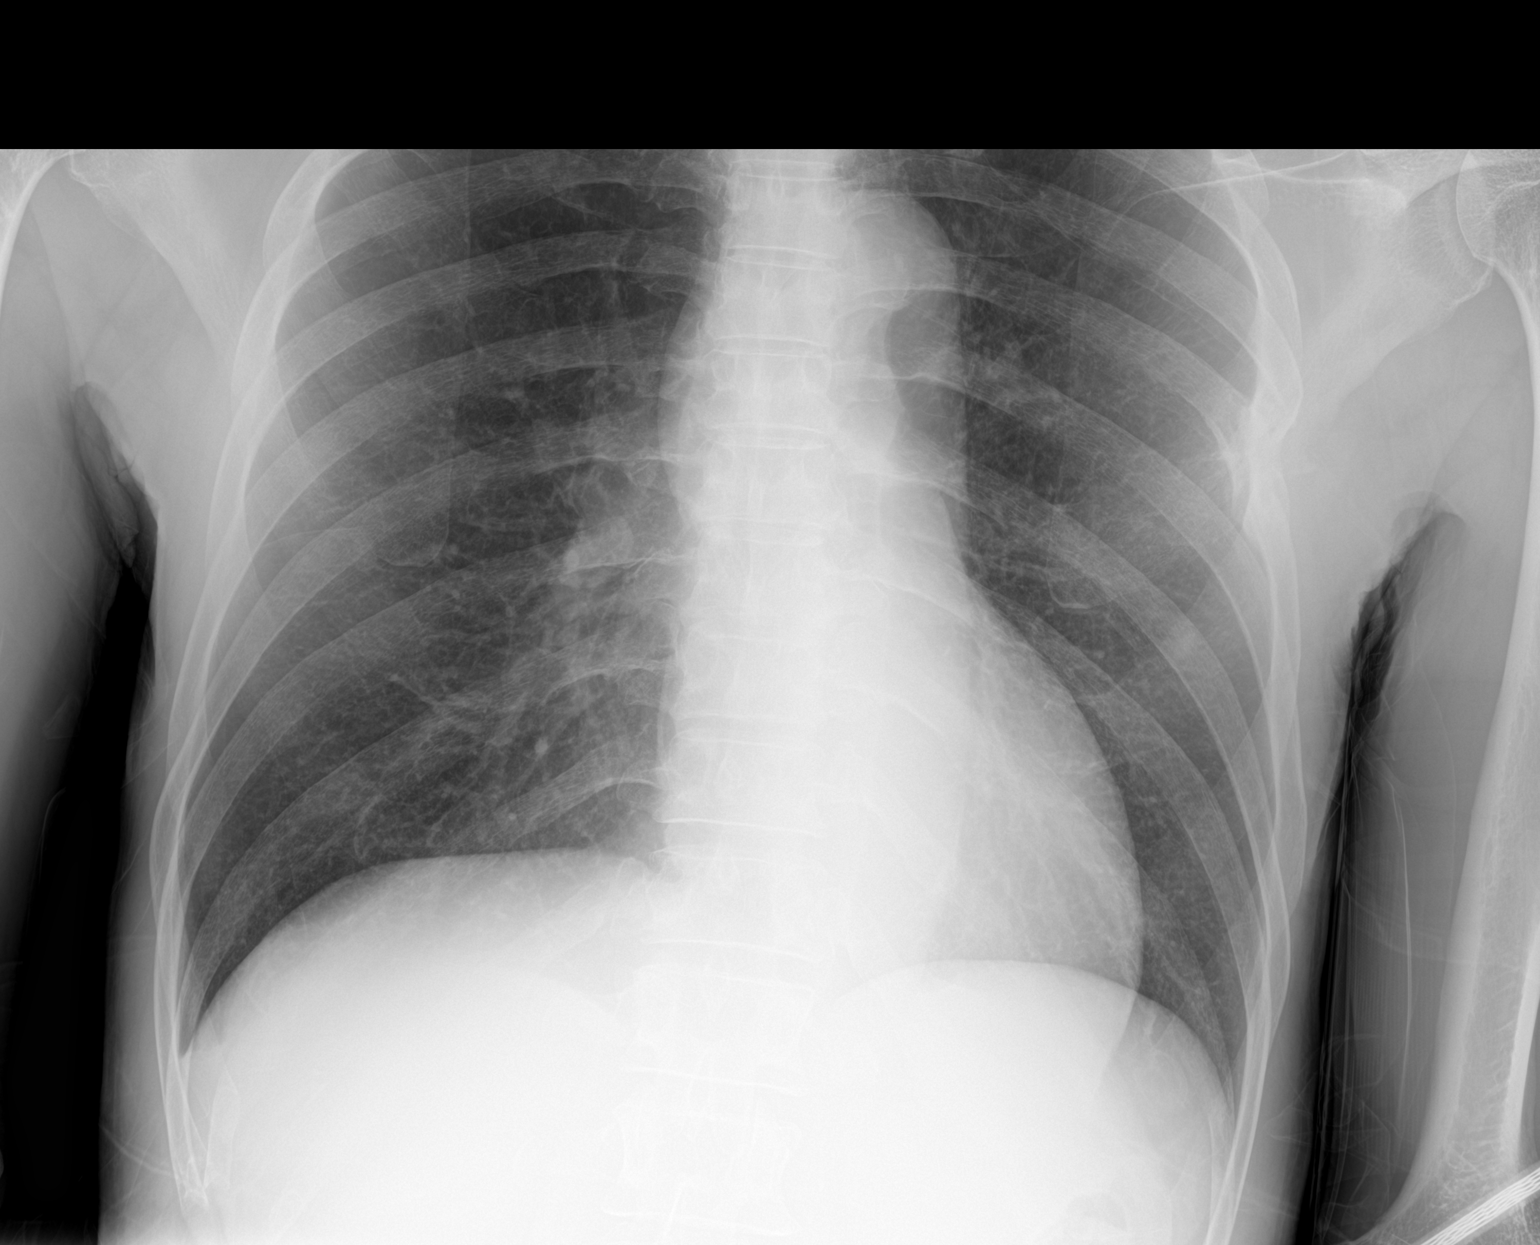

[2 of 2 positions shown; findings below may reference images not displayed]

FINDINGS: Heart size within normal limits. Redemonstrated chronic left-sided
rib fracture deformities with adjacent mild pulmonary scarring. No
appreciable airspace consolidation or pulmonary edema. There is an
apparent subcentimeter nodule within the right mid to lower lung
field, new as compared to the prior chest radiographs of [DATE].
Trace right pleural effusion. No evidence of pneumothorax. No acute
bony abnormality.
IMPRESSION: Apparent nodule within the right mid to lower lung field, new as
compared to the chest radiographs of [DATE]. Nonemergent chest
CT is recommended for further evaluation.

Trace right pleural effusion.

Chronic left-sided rib fracture deformities with adjacent mild
pulmonary scarring.

No appreciable airspace consolidation or pulmonary edema.

## 2020-06-02 MED ORDER — FENTANYL CITRATE (PF) 100 MCG/2ML IJ SOLN
50.0000 ug | Freq: Once | INTRAMUSCULAR | Status: AC
  Administered 2020-06-02: 50 ug via INTRAVENOUS
  Filled 2020-06-02: qty 2

## 2020-06-02 MED ORDER — AMOXICILLIN-POT CLAVULANATE 875-125 MG PO TABS: 1.0000 | ORAL_TABLET | Freq: Two times a day (BID) | ORAL | 0 refills | Status: DC

## 2020-06-02 MED ORDER — IOHEXOL 300 MG/ML  SOLN
100.0000 mL | Freq: Once | INTRAMUSCULAR | Status: AC | PRN
  Administered 2020-06-02: 100 mL via INTRAVENOUS

## 2020-06-02 MED ORDER — SODIUM CHLORIDE 0.9 % IV BOLUS
500.0000 mL | Freq: Once | INTRAVENOUS | Status: AC
  Administered 2020-06-02: 500 mL via INTRAVENOUS

## 2020-06-02 NOTE — ED Triage Notes (Signed)
BIBA Per EMS: Pt coming from home with complaints of rectal pian. Denies any injury. States it has been going on for several months and it has gotten worse today. Hx of rectal cancer. Unsure of remission but not getting treatment at this time. Bedbugs present on pt.  Vitals: 170/90 80HR 97% room air  18 RR 97.4

## 2020-06-02 NOTE — Discharge Instructions (Signed)
Follow-up Dr. Drucilla Schmidt

## 2020-06-02 NOTE — ED Notes (Signed)
Pt in bed resting bed. Denies any needs at this time. Respirations even and unlabored.
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
## 2020-06-02 NOTE — ED Notes (Signed)
Called pt brother and he is on the way to come get him.

## 2020-06-02 NOTE — ED Notes (Signed)
Ct is waiting on another machine before they get this pt. They state that if he has any additional bugs they have to shut this machine down and that the dept will not have any other scanners available. Dr was notified, pt has had a head to toe bath and fresh linens. RN and tech have checked pt multiple times for any additional bugs and have found nothing.

## 2020-06-02 NOTE — ED Provider Notes (Addendum)
Informed by nursing staff the patient is out front and requested that his antibiotic be changed to the [facility] off of [address] instead of the [facility].  Previous provider has left for the day.  I sent patient's prescription for Augmentin the preferred pharmacy.  Note: Portions of this report may have been transcribed using voice recognition software. Every effort was made to ensure accuracy; however, inadvertent computerized transcription errors may still be present.   Deliah Boston, PA-C 06/02/20 1852    Deliah Boston, PA-C 06/02/20 Laurey Morale, MD 06/04/20 0001

## 2020-07-31 ENCOUNTER — Other Ambulatory Visit: Payer: Self-pay

## 2020-07-31 ENCOUNTER — Encounter (HOSPITAL_COMMUNITY): Payer: Self-pay

## 2020-07-31 ENCOUNTER — Emergency Department (HOSPITAL_COMMUNITY): Payer: Medicare Other

## 2020-07-31 ENCOUNTER — Emergency Department (HOSPITAL_COMMUNITY)
Admission: EM | Admit: 2020-07-31 | Discharge: 2020-07-31 | Disposition: A | Discharge status: Home / Self Care | Payer: Medicare Other | Attending: Emergency Medicine | Admitting: Emergency Medicine

## 2020-07-31 DIAGNOSIS — K6289 Other specified diseases of anus and rectum: Secondary | ICD-10-CM | POA: Insufficient documentation

## 2020-07-31 DIAGNOSIS — C20 Malignant neoplasm of rectum: Secondary | ICD-10-CM | POA: Insufficient documentation

## 2020-07-31 DIAGNOSIS — B20 Human immunodeficiency virus [HIV] disease: Secondary | ICD-10-CM | POA: Insufficient documentation

## 2020-07-31 DIAGNOSIS — Z20822 Contact with and (suspected) exposure to covid-19: Secondary | ICD-10-CM | POA: Diagnosis not present

## 2020-07-31 DIAGNOSIS — G8929 Other chronic pain: Secondary | ICD-10-CM

## 2020-07-31 DIAGNOSIS — I10 Essential (primary) hypertension: Secondary | ICD-10-CM | POA: Diagnosis not present

## 2020-07-31 DIAGNOSIS — F17200 Nicotine dependence, unspecified, uncomplicated: Secondary | ICD-10-CM | POA: Diagnosis not present

## 2020-07-31 DIAGNOSIS — C210 Malignant neoplasm of anus, unspecified: Secondary | ICD-10-CM

## 2020-07-31 LAB — CBC WITH DIFFERENTIAL/PLATELET
Abs Immature Granulocytes: 0.03 10*3/uL (ref 0.00–0.07)
Basophils Absolute: 0 10*3/uL (ref 0.0–0.1)
Basophils Relative: 1 %
Eosinophils Absolute: 0 10*3/uL (ref 0.0–0.5)
Eosinophils Relative: 1 %
HCT: 47.2 % (ref 39.0–52.0)
Hemoglobin: 15.1 g/dL (ref 13.0–17.0)
Immature Granulocytes: 1 %
Lymphocytes Relative: 20 %
Lymphs Abs: 0.9 10*3/uL (ref 0.7–4.0)
MCH: 29 pg (ref 26.0–34.0)
MCHC: 32 g/dL (ref 30.0–36.0)
MCV: 90.6 fL (ref 80.0–100.0)
Monocytes Absolute: 0.6 10*3/uL (ref 0.1–1.0)
Monocytes Relative: 13 %
Neutro Abs: 2.9 10*3/uL (ref 1.7–7.7)
Neutrophils Relative %: 64 %
Platelets: 289 10*3/uL (ref 150–400)
RBC: 5.21 MIL/uL (ref 4.22–5.81)
RDW: 13.6 % (ref 11.5–15.5)
WBC: 4.5 10*3/uL (ref 4.0–10.5)
nRBC: 0 % (ref 0.0–0.2)

## 2020-07-31 LAB — BASIC METABOLIC PANEL
Anion gap: 11 (ref 5–15)
BUN: 17 mg/dL (ref 8–23)
CO2: 23 mmol/L (ref 22–32)
Calcium: 9.4 mg/dL (ref 8.9–10.3)
Chloride: 103 mmol/L (ref 98–111)
Creatinine, Ser: 1.3 mg/dL — ABNORMAL HIGH (ref 0.61–1.24)
GFR, Estimated: >60 mL/min (ref >60)
Glucose, Bld: 87 mg/dL (ref 70–99)
Potassium: 4 mmol/L (ref 3.5–5.1)
Sodium: 137 mmol/L (ref 135–145)

## 2020-07-31 LAB — RESP PANEL BY RT-PCR (FLU A&B, COVID) ARPGX2
Influenza A by PCR: NEGATIVE
Influenza B by PCR: NEGATIVE
SARS Coronavirus 2 by RT PCR: NEGATIVE

## 2020-07-31 IMAGING — CT CT ABD-PELV W/ CM
2 of 5 series · 16 of 46 positions shown, 18 images · IV contrast (APPLIED)
Comparison: [DATE]

CLINICAL DATA: Perirectal pain. Evaluate for intra-abdominal
abscess. History of anorectal cancer. HIV.

EXAM:
CT ABDOMEN AND PELVIS WITH CONTRAST
TECHNIQUE: Multidetector CT imaging of the abdomen and pelvis was performed
using the standard protocol following bolus administration of
intravenous contrast.
CONTRAST:  100mL OMNIPAQUE IOHEXOL 300 MG/ML  SOLN

[Series 3: abd/ pelvis 5.0 i30f 2 · axial · 0.73mm/px · z∈[-519,-89]mm · 13 of 96 slices shown, 15 images]
[im 5/96  soft-tissue]
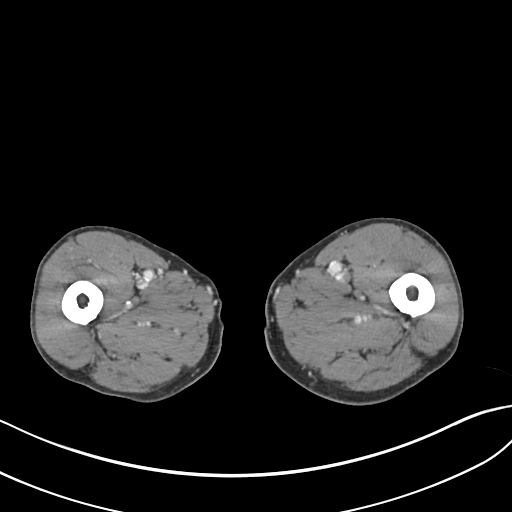
[im 5/96  bone]
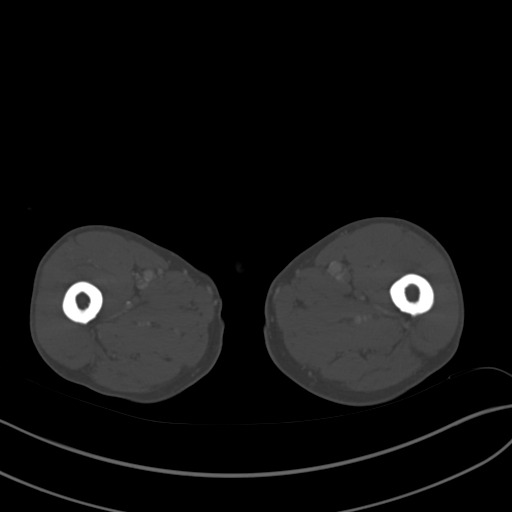
[im 15/96  soft-tissue]
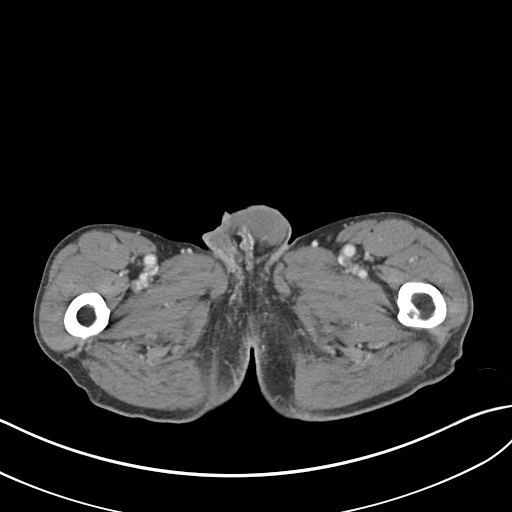
[im 20/96  soft-tissue]
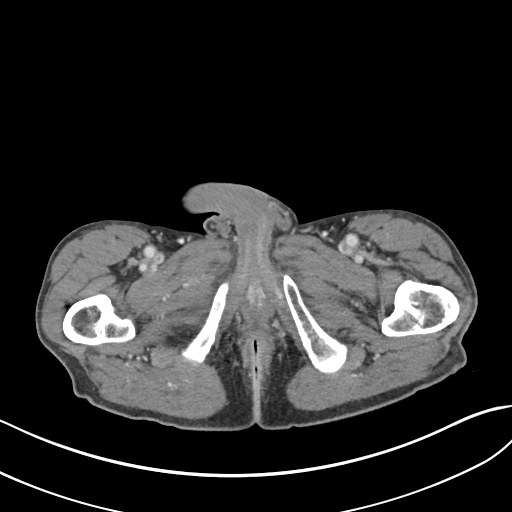
[im 29/96  soft-tissue]
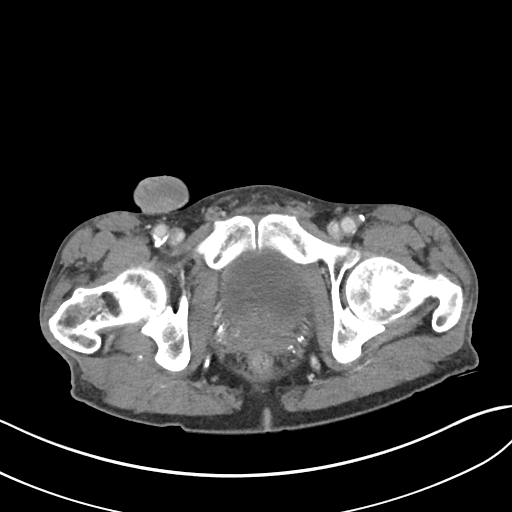
[im 34/96  soft-tissue]
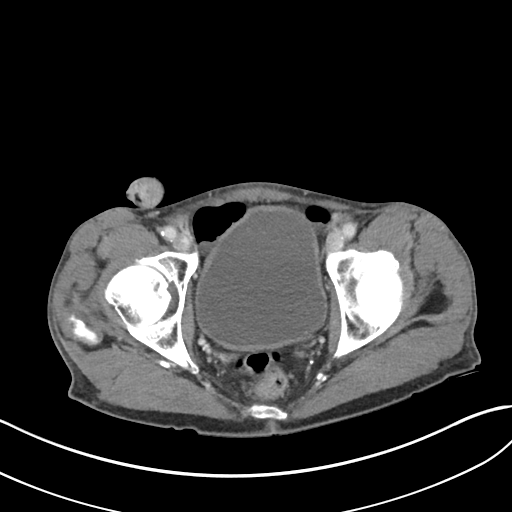
[im 43/96  soft-tissue]
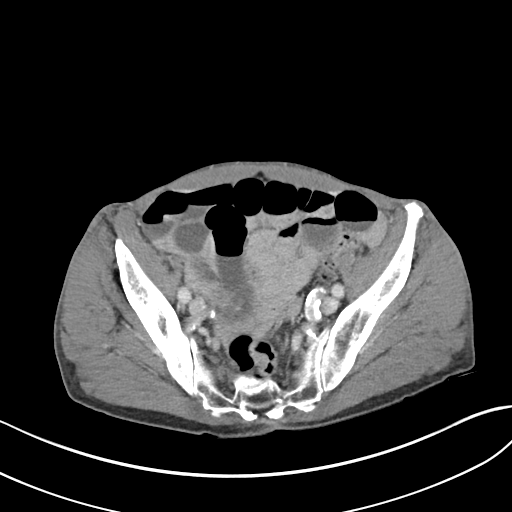
[im 48/96  soft-tissue]
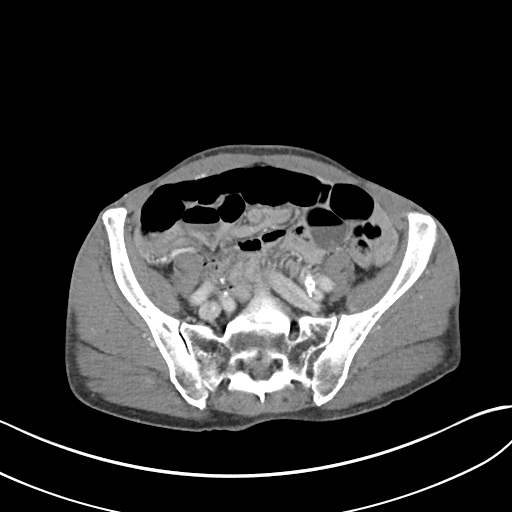
[im 53/96  soft-tissue]
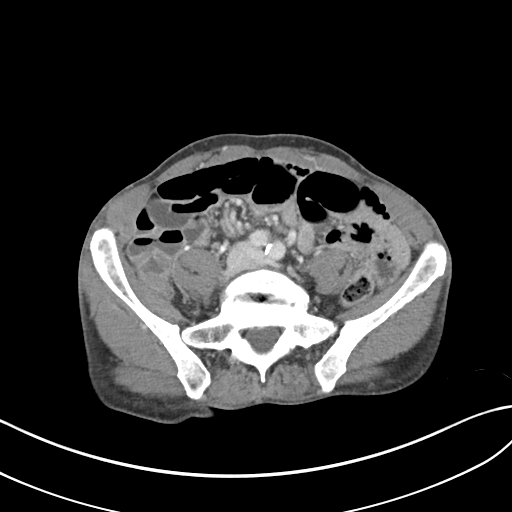
[im 62/96  soft-tissue]
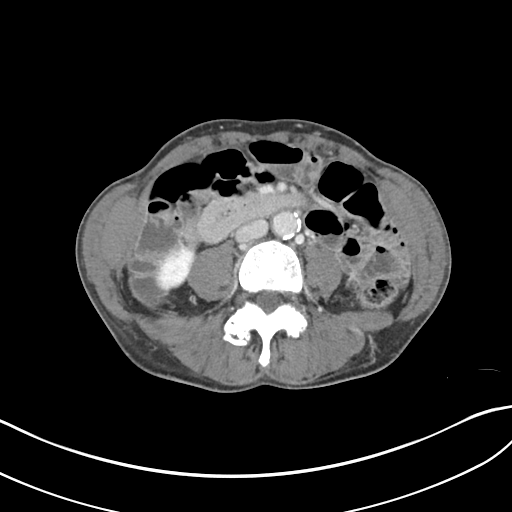
[im 62/96  bone]
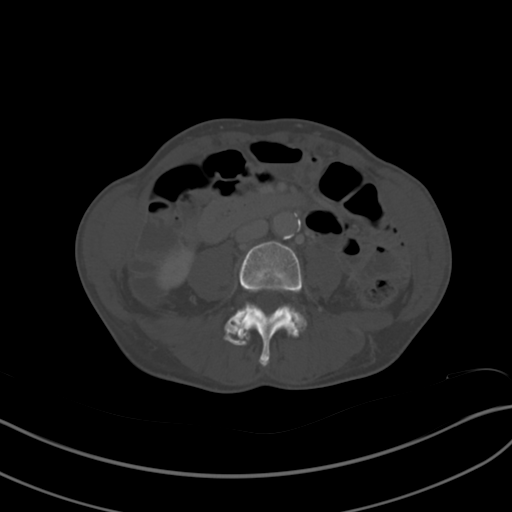
[im 67/96  soft-tissue]
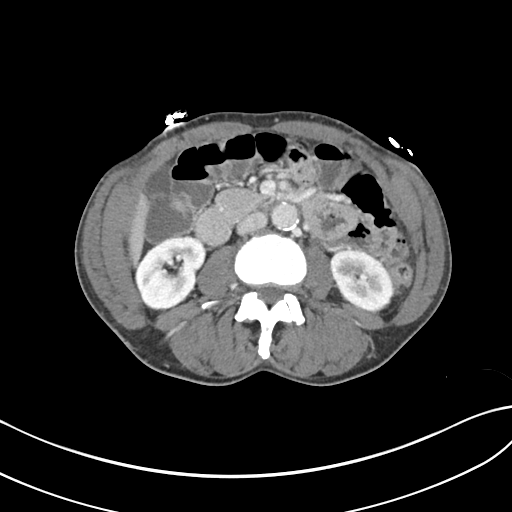
[im 77/96  soft-tissue]
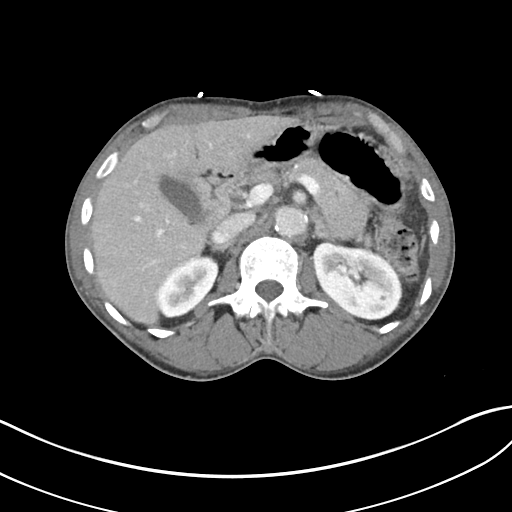
[im 81/96  soft-tissue]
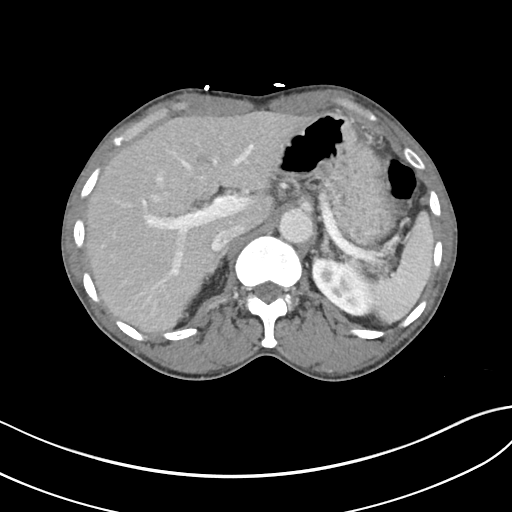
[im 91/96  soft-tissue]
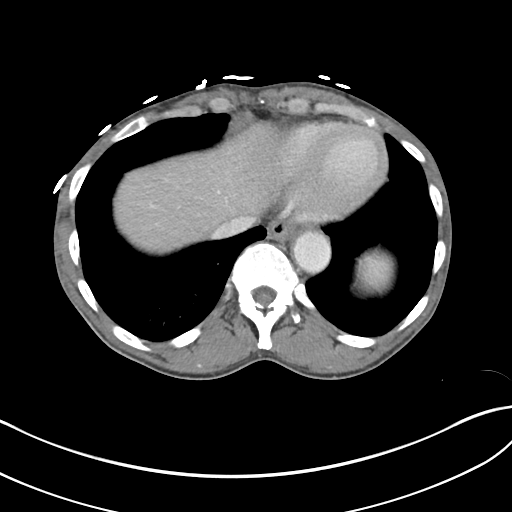

[Series 6: coronal soft tissue · coronal · 0.68mm/px · 3 of 101 slices shown]
[im 34/101  soft-tissue]
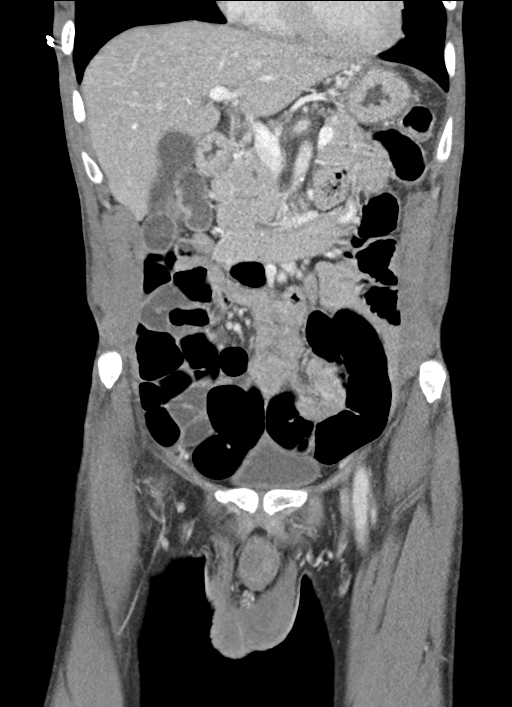
[im 45/101  soft-tissue]
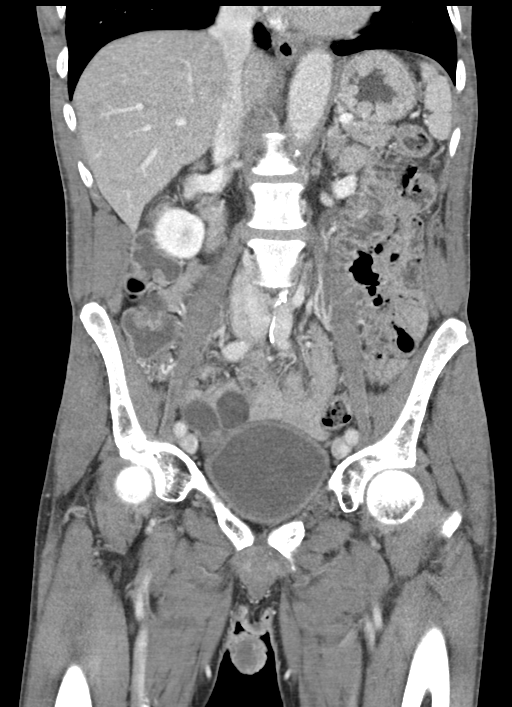
[im 56/101  soft-tissue]
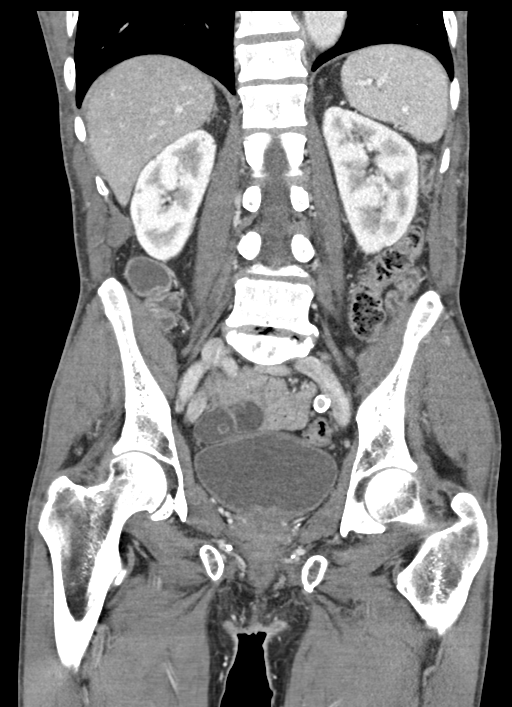

[16 of 46 positions shown; findings below may reference images not displayed]

FINDINGS: Lower chest: Mild emphysema. Mild cardiomegaly, without pericardial
or pleural effusion.

Hepatobiliary: Suspicion of mild hepatic steatosis, without focal
liver lesion. Normal gallbladder, without biliary ductal dilatation.

Pancreas: Normal, without mass or ductal dilatation.

Spleen: Normal in size, without focal abnormality.

Adrenals/Urinary Tract: Normal adrenal glands. Bilateral too small
to characterize renal lesions. No hydronephrosis. Normal urinary
bladder.

Stomach/Bowel: Portions of the stomach are underdistended.

Normal anus and rectum, without evidence of mass or inflammation.

Normal remainder of the colon. Normal terminal ileum and appendix.
Mild cachexia. Given this limitation, normal appearance of small
bowel and mesentery.

Vascular/Lymphatic: Aortic atherosclerosis. No abdominopelvic
adenopathy.

Reproductive: Normal prostate.

Other: No significant free fluid. No evidence of omental or
peritoneal disease.

Musculoskeletal: No acute osseous abnormality. Degenerate disc
disease at the lumbosacral junction with trace L4-5 anterolisthesis.
IMPRESSION: 1. No acute process or explanation for perirectal pain. No history
of residual or recurrent anorectal cancer.
2. Aortic atherosclerosis ([UQ]-[UQ]) and emphysema ([UQ]-[UQ]).

## 2020-07-31 MED ORDER — HYDROCODONE-ACETAMINOPHEN 5-325 MG PO TABS
2.0000 | ORAL_TABLET | Freq: Once | ORAL | Status: AC
  Administered 2020-07-31: 2 via ORAL
  Filled 2020-07-31: qty 2

## 2020-07-31 MED ORDER — IOHEXOL 300 MG/ML  SOLN
100.0000 mL | Freq: Once | INTRAMUSCULAR | Status: AC | PRN
  Administered 2020-07-31: 100 mL via INTRAVENOUS

## 2020-07-31 MED ORDER — MORPHINE SULFATE (PF) 4 MG/ML IV SOLN
4.0000 mg | Freq: Once | INTRAVENOUS | Status: AC
  Administered 2020-07-31: 4 mg via INTRAVENOUS
  Filled 2020-07-31: qty 1

## 2020-07-31 MED ORDER — HYDROCODONE-ACETAMINOPHEN 5-325 MG PO TABS
1.0000 | ORAL_TABLET | ORAL | 0 refills | Status: DC | PRN
Start: 2020-07-31 — End: 2020-08-25
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
## 2020-07-31 NOTE — ED Notes (Signed)
Pt was refusing to be cleaned at this time after having a BM. This RN advised the pt that he cannot lay in filth and allowed this RN to clean this pt.

## 2020-07-31 NOTE — Discharge Instructions (Addendum)
You were seen in the emergency department for worsening rectal pain especially with stooling.  You had blood work and a CAT scan of your abdomen and pelvis that did not show any significant abnormalities.  We are providing you a prescription for some pain medicine.  This is likely related to your anal cancer and will need follow-up with your infectious disease doctor.  Please call them on Monday for close follow-up.  Return to the emergency department if any worsening or concerning symptoms

## 2020-07-31 NOTE — ED Notes (Signed)
This RN contacted [employer] to provide transportation for pt to home. Pt made aware and Lobby Staff made aware transportation was en route for patient.

## 2020-07-31 NOTE — ED Notes (Signed)
Patient verbalizes understanding of discharge instructions. Opportunity for questioning and answers were provided. Armband removed by staff, pt discharged from ED to lobby where pt is awaiting transport to home.

## 2020-07-31 NOTE — ED Triage Notes (Signed)
Pt bib [facility] for rectal abscess since wednesday

## 2020-08-02 ENCOUNTER — Telehealth: Payer: Self-pay

## 2020-08-02 NOTE — Telephone Encounter (Signed)
Patient requested to speak with Dr. Daiva Eves regarding most recent hospital visit. States that he is still having rectal pain and would like for him to prescribe something to help. Does not have a PCP at this time. ED prescribed Hydrocodone on 12/25. Patient has not picked this up yet. Advised patient pick up prescription from pharmacy. Encouraged he establish primary care. Is currently scheduled to follow up with Dr. Daiva Eves on 08/09/20.

## 2020-08-09 ENCOUNTER — Other Ambulatory Visit: Payer: Self-pay

## 2020-08-09 ENCOUNTER — Encounter: Payer: Self-pay | Admitting: Infectious Disease

## 2020-08-09 ENCOUNTER — Ambulatory Visit (INDEPENDENT_AMBULATORY_CARE_PROVIDER_SITE_OTHER): Payer: Medicare Other | Admitting: Infectious Disease

## 2020-08-09 ENCOUNTER — Ambulatory Visit: Payer: Medicare Other | Admitting: Infectious Disease

## 2020-08-09 VITALS — BP 194/101 | HR 63 | Temp 97.7°F | Ht 67.0 in | Wt 137.0 lb

## 2020-08-09 DIAGNOSIS — B182 Chronic viral hepatitis C: Secondary | ICD-10-CM

## 2020-08-09 DIAGNOSIS — B20 Human immunodeficiency virus [HIV] disease: Secondary | ICD-10-CM | POA: Diagnosis present

## 2020-08-09 DIAGNOSIS — K625 Hemorrhage of anus and rectum: Secondary | ICD-10-CM

## 2020-08-09 DIAGNOSIS — C801 Malignant (primary) neoplasm, unspecified: Secondary | ICD-10-CM

## 2020-08-09 DIAGNOSIS — I10 Essential (primary) hypertension: Secondary | ICD-10-CM | POA: Diagnosis not present

## 2020-08-09 HISTORY — DX: Hemorrhage of anus and rectum: K62.5
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
## 2020-08-10 ENCOUNTER — Ambulatory Visit: Payer: Medicare Other | Admitting: Infectious Disease

## 2020-08-10 LAB — T-HELPER CELL (CD4) - (RCID CLINIC ONLY)
CD4 % Helper T Cell: 24 % — ABNORMAL LOW (ref 33–65)
CD4 T Cell Abs: 238 /uL — ABNORMAL LOW (ref 400–1790)

## 2020-08-18 LAB — CBC WITH DIFFERENTIAL/PLATELET
Absolute Monocytes: 586 cells/uL (ref 200–950)
Basophils Absolute: 41 cells/uL (ref 0–200)
Basophils Relative: 1.4 %
Eosinophils Absolute: 29 cells/uL (ref 15–500)
Eosinophils Relative: 1 %
HCT: 46.9 % (ref 38.5–50.0)
Hemoglobin: 15.4 g/dL (ref 13.2–17.1)
Lymphs Abs: 992 cells/uL (ref 850–3900)
MCH: 29.3 pg (ref 27.0–33.0)
MCHC: 32.8 g/dL (ref 32.0–36.0)
MCV: 89.2 fL (ref 80.0–100.0)
MPV: 9.1 fL (ref 7.5–12.5)
Monocytes Relative: 20.2 %
Neutro Abs: 1253 cells/uL — ABNORMAL LOW (ref 1500–7800)
Neutrophils Relative %: 43.2 %
Platelets: 301 10*3/uL (ref 140–400)
RBC: 5.26 10*6/uL (ref 4.20–5.80)
RDW: 12.4 % (ref 11.0–15.0)
Total Lymphocyte: 34.2 %
WBC: 2.9 10*3/uL — ABNORMAL LOW (ref 3.8–10.8)

## 2020-08-18 LAB — COMPLETE METABOLIC PANEL WITH GFR
AG Ratio: 1 (calc) (ref 1.0–2.5)
ALT: 46 U/L (ref 9–46)
AST: 53 U/L — ABNORMAL HIGH (ref 10–35)
Albumin: 4.2 g/dL (ref 3.6–5.1)
Alkaline phosphatase (APISO): 78 U/L (ref 35–144)
BUN: 12 mg/dL (ref 7–25)
CO2: 28 mmol/L (ref 20–32)
Calcium: 9.6 mg/dL (ref 8.6–10.3)
Chloride: 108 mmol/L (ref 98–110)
Creat: 1.02 mg/dL (ref 0.70–1.25)
GFR, Est African American: 89 mL/min/{1.73_m2} (ref >=60)
GFR, Est Non African American: 77 mL/min/{1.73_m2} (ref >=60)
Globulin: 4.3 g/dL (calc) — ABNORMAL HIGH (ref 1.9–3.7)
Glucose, Bld: 96 mg/dL (ref 65–99)
Potassium: 3.9 mmol/L (ref 3.5–5.3)
Sodium: 143 mmol/L (ref 135–146)
Total Bilirubin: 0.5 mg/dL (ref 0.2–1.2)
Total Protein: 8.5 g/dL — ABNORMAL HIGH (ref 6.1–8.1)

## 2020-08-18 LAB — HEPATITIS C RNA QUANTITATIVE
HCV Quantitative Log: 6.08 log IU/mL — ABNORMAL HIGH
HCV RNA, PCR, QN: 1200000 IU/mL — ABNORMAL HIGH

## 2020-08-18 LAB — HIV-1 RNA QUANT-NO REFLEX-BLD
HIV 1 RNA Quant: <20 Copies/mL — ABNORMAL HIGH
HIV-1 RNA Quant, Log: <1.3 Log cps/mL — ABNORMAL HIGH

## 2020-08-18 LAB — HEPATITIS C GENOTYPE

## 2020-08-18 LAB — RPR: RPR Ser Ql: NONREACTIVE

## 2020-08-24 ENCOUNTER — Emergency Department (HOSPITAL_COMMUNITY): Payer: Medicare Other

## 2020-08-24 ENCOUNTER — Emergency Department (HOSPITAL_COMMUNITY)
Admission: EM | Admit: 2020-08-24 | Discharge: 2020-08-25 | Disposition: A | Discharge status: Home / Self Care | Payer: Medicare Other | Attending: Emergency Medicine | Admitting: Emergency Medicine

## 2020-08-24 ENCOUNTER — Other Ambulatory Visit: Payer: Self-pay

## 2020-08-24 ENCOUNTER — Encounter (HOSPITAL_COMMUNITY): Payer: Self-pay | Admitting: Radiology

## 2020-08-24 DIAGNOSIS — R55 Syncope and collapse: Secondary | ICD-10-CM | POA: Insufficient documentation

## 2020-08-24 DIAGNOSIS — R110 Nausea: Secondary | ICD-10-CM | POA: Diagnosis not present

## 2020-08-24 DIAGNOSIS — K6289 Other specified diseases of anus and rectum: Secondary | ICD-10-CM | POA: Diagnosis not present

## 2020-08-24 DIAGNOSIS — F17200 Nicotine dependence, unspecified, uncomplicated: Secondary | ICD-10-CM | POA: Diagnosis not present

## 2020-08-24 DIAGNOSIS — N481 Balanitis: Secondary | ICD-10-CM | POA: Diagnosis not present

## 2020-08-24 DIAGNOSIS — B20 Human immunodeficiency virus [HIV] disease: Secondary | ICD-10-CM | POA: Insufficient documentation

## 2020-08-24 DIAGNOSIS — R109 Unspecified abdominal pain: Secondary | ICD-10-CM | POA: Insufficient documentation

## 2020-08-24 DIAGNOSIS — K219 Gastro-esophageal reflux disease without esophagitis: Secondary | ICD-10-CM | POA: Insufficient documentation

## 2020-08-24 DIAGNOSIS — I10 Essential (primary) hypertension: Secondary | ICD-10-CM | POA: Diagnosis not present

## 2020-08-24 DIAGNOSIS — G8929 Other chronic pain: Secondary | ICD-10-CM

## 2020-08-24 LAB — HEPATIC FUNCTION PANEL
ALT: 33 U/L (ref 0–44)
AST: 38 U/L (ref 15–41)
Albumin: 3.7 g/dL (ref 3.5–5.0)
Alkaline Phosphatase: 72 U/L (ref 38–126)
Bilirubin, Direct: 0.1 mg/dL (ref 0.0–0.2)
Indirect Bilirubin: 0.3 mg/dL (ref 0.3–0.9)
Total Bilirubin: 0.4 mg/dL (ref 0.3–1.2)
Total Protein: 8 g/dL (ref 6.5–8.1)

## 2020-08-24 LAB — BASIC METABOLIC PANEL
Anion gap: 8 (ref 5–15)
BUN: 20 mg/dL (ref 8–23)
CO2: 27 mmol/L (ref 22–32)
Calcium: 9.2 mg/dL (ref 8.9–10.3)
Chloride: 105 mmol/L (ref 98–111)
Creatinine, Ser: 1.01 mg/dL (ref 0.61–1.24)
GFR, Estimated: >60 mL/min (ref >60)
Glucose, Bld: 93 mg/dL (ref 70–99)
Potassium: 4.1 mmol/L (ref 3.5–5.1)
Sodium: 140 mmol/L (ref 135–145)

## 2020-08-24 LAB — CBC
HCT: 44.9 % (ref 39.0–52.0)
Hemoglobin: 14.2 g/dL (ref 13.0–17.0)
MCH: 29.1 pg (ref 26.0–34.0)
MCHC: 31.6 g/dL (ref 30.0–36.0)
MCV: 92 fL (ref 80.0–100.0)
Platelets: 284 10*3/uL (ref 150–400)
RBC: 4.88 MIL/uL (ref 4.22–5.81)
RDW: 13.4 % (ref 11.5–15.5)
WBC: 3.9 10*3/uL — ABNORMAL LOW (ref 4.0–10.5)
nRBC: 0 % (ref 0.0–0.2)

## 2020-08-24 LAB — TROPONIN I (HIGH SENSITIVITY): Troponin I (High Sensitivity): 12 ng/L (ref <18)

## 2020-08-24 LAB — CBG MONITORING, ED: Glucose-Capillary: 105 mg/dL — ABNORMAL HIGH (ref 70–99)

## 2020-08-24 LAB — LIPASE, BLOOD: Lipase: 32 U/L (ref 11–51)

## 2020-08-24 IMAGING — CR DG CHEST 2V
3 series · 3 of 3 positions shown · non-contrast
Comparison: None.

CLINICAL DATA: Shortness of breath

EXAM:
CHEST - 2 VIEW

[w chest lat]
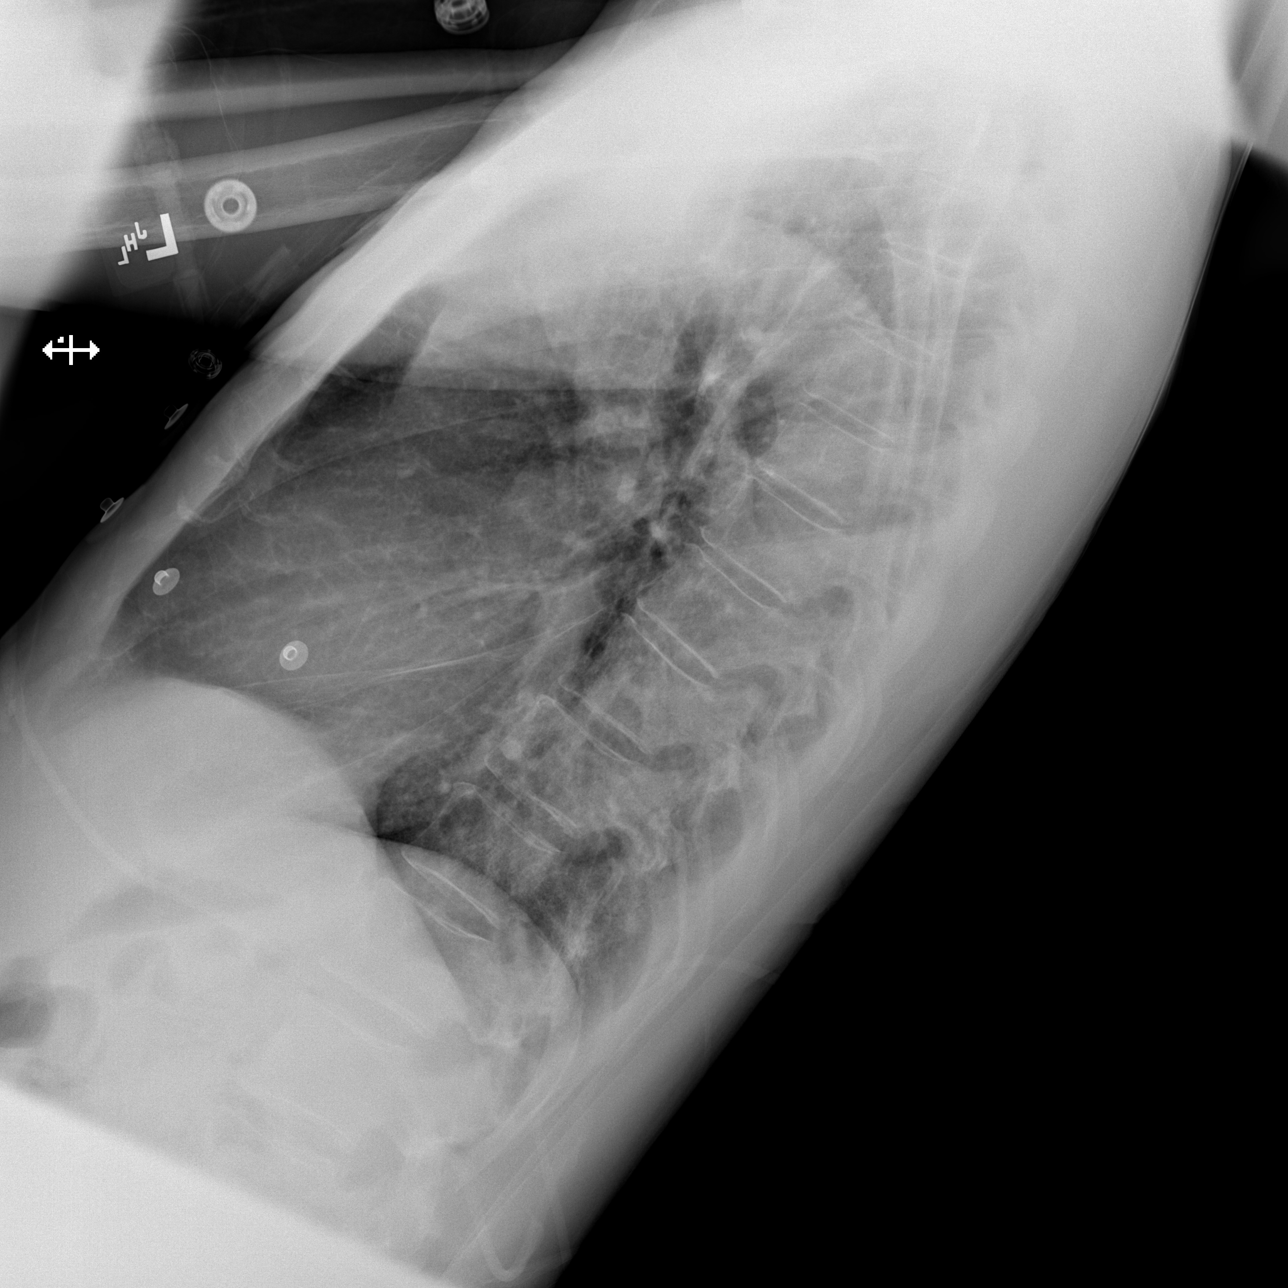

[x chest ap (1 of 2)]
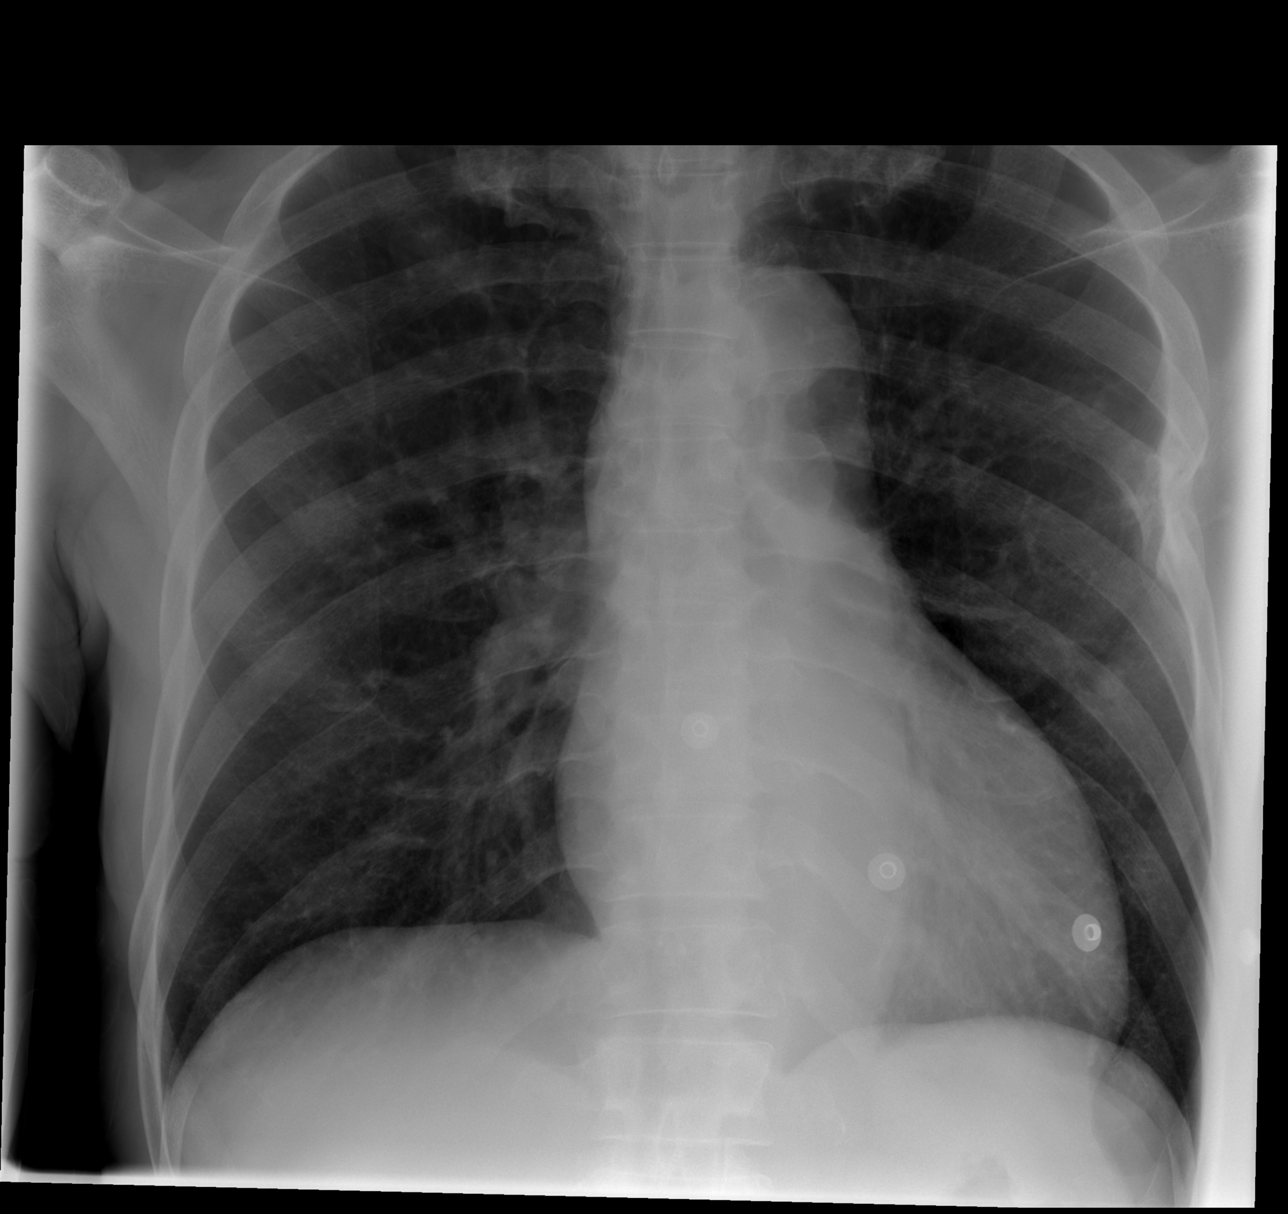

[x chest ap (2 of 2)]
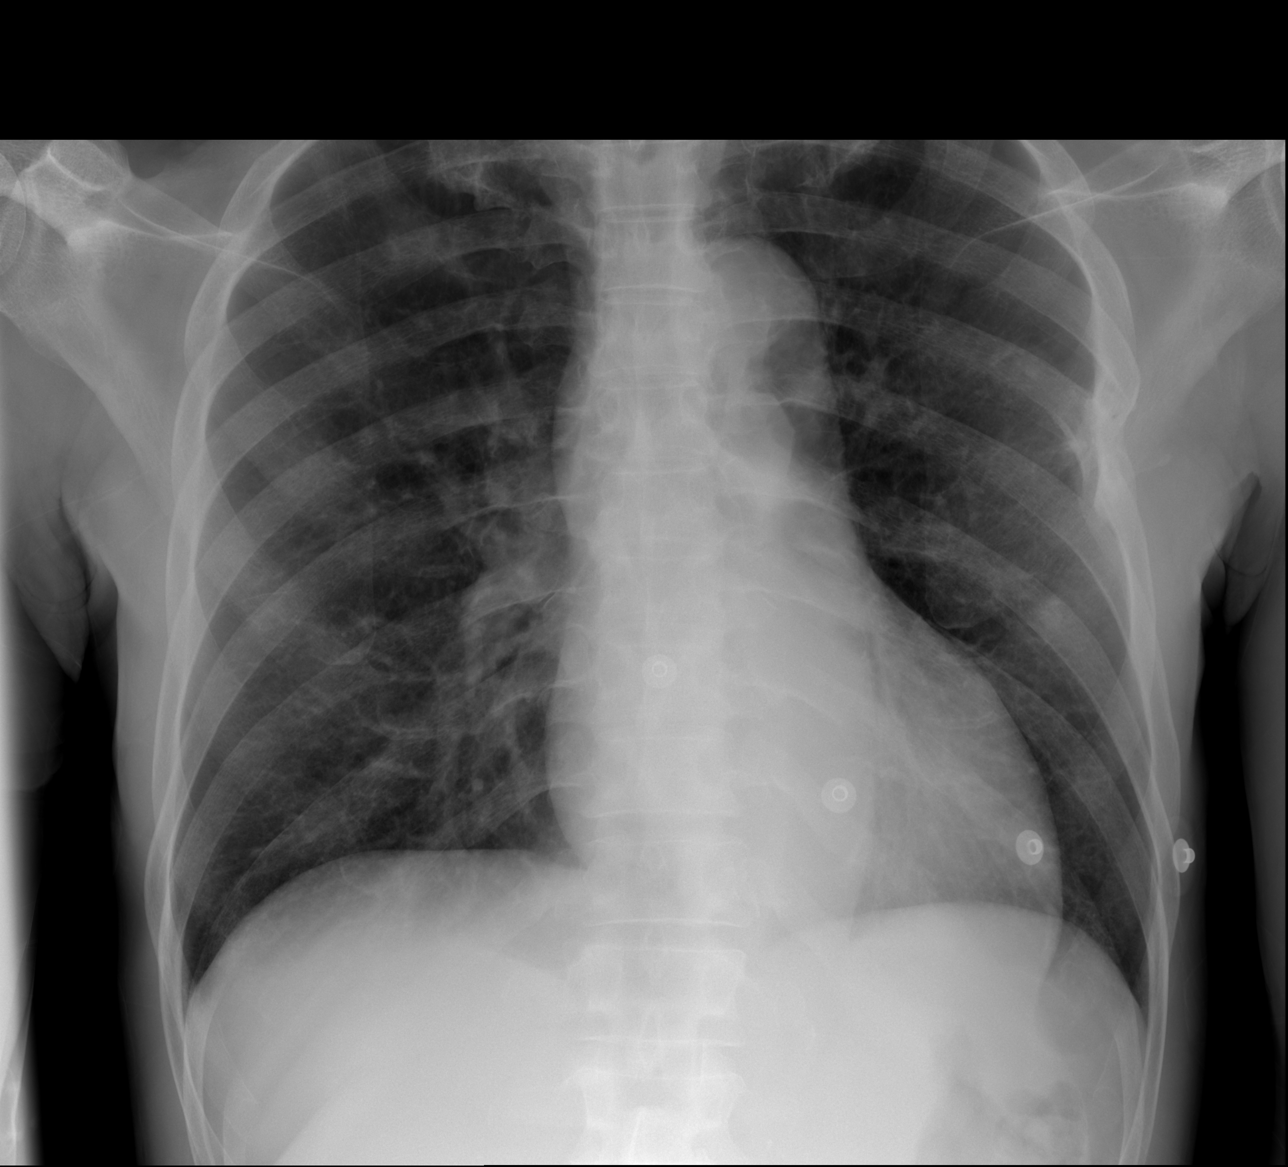

[3 of 3 positions shown; findings below may reference images not displayed]

FINDINGS: The heart size and mediastinal contours are within normal limits.
Both lungs are clear. The visualized skeletal structures are
unremarkable.
IMPRESSION: No active cardiopulmonary disease.

## 2020-08-24 IMAGING — CT CT ABD-PELV W/ CM
2 of 5 series · 15 of 46 positions shown, 17 images · IV contrast (omnipaque)
Comparison: CT abdomen pelvis [DATE], chest x-ray [DATE]

CLINICAL DATA: Diaphoresis, stomach pain, syncope

EXAM:
CT ABDOMEN AND PELVIS WITH CONTRAST
TECHNIQUE: Multidetector CT imaging of the abdomen and pelvis was performed
using the standard protocol following bolus administration of
intravenous contrast.
CONTRAST:  100mL OMNIPAQUE IOHEXOL 300 MG/ML  SOLN

[Series 2: axial st · axial · 0.76mm/px · z∈[+1130,+1495]mm · 12 of 85 slices shown, 14 images]
[im 6/85  soft-tissue]
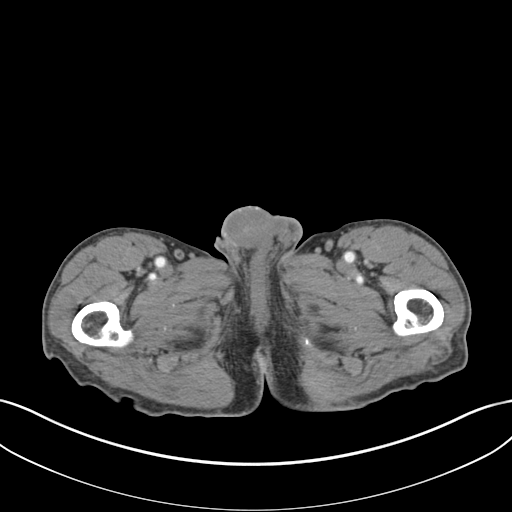
[im 6/85  bone]
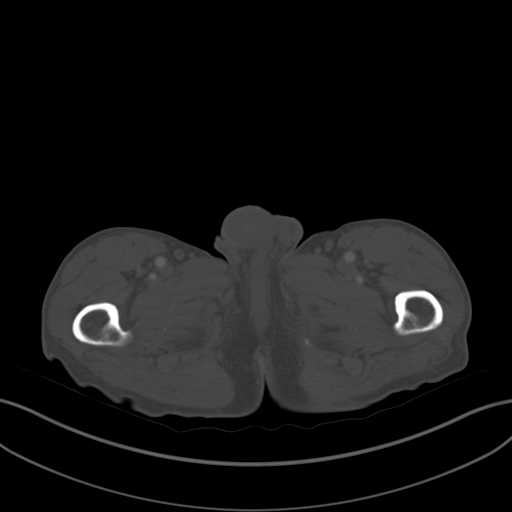
[im 12/85  soft-tissue]
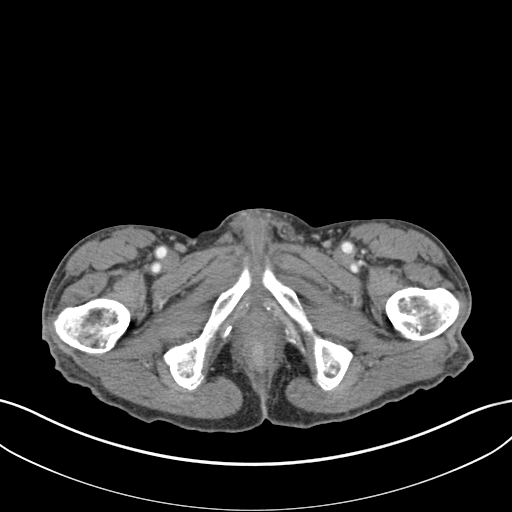
[im 17/85  soft-tissue]
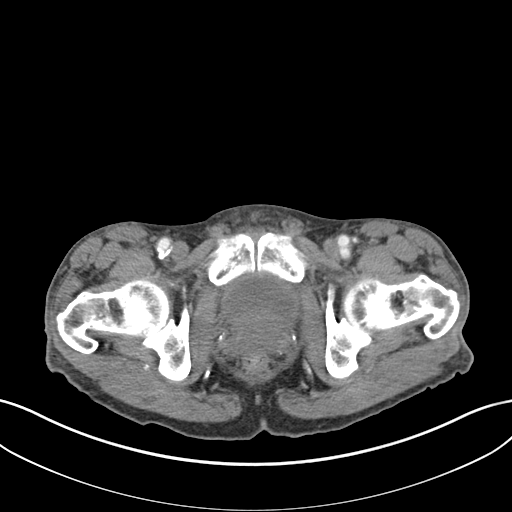
[im 29/85  soft-tissue]
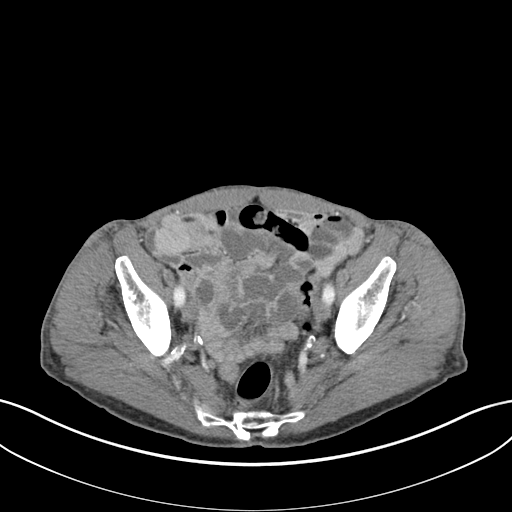
[im 34/85  soft-tissue]
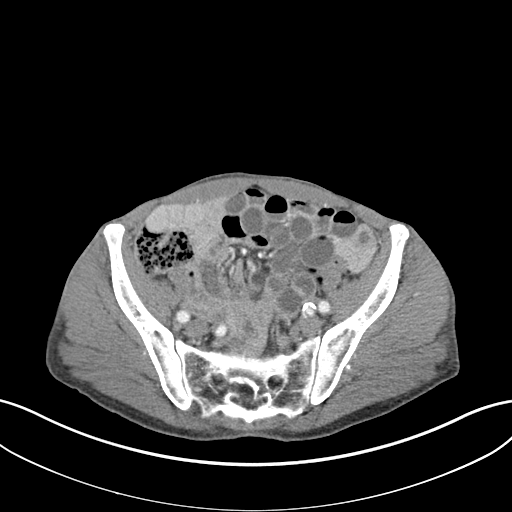
[im 40/85  soft-tissue]
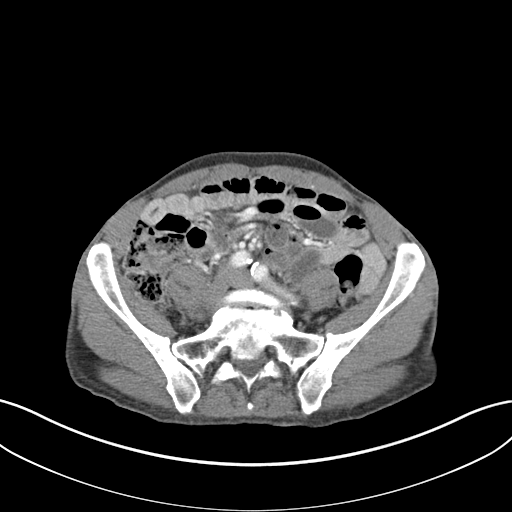
[im 45/85  soft-tissue]
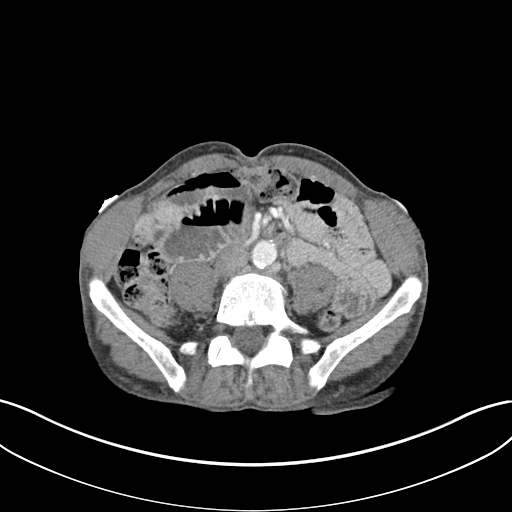
[im 51/85  soft-tissue]
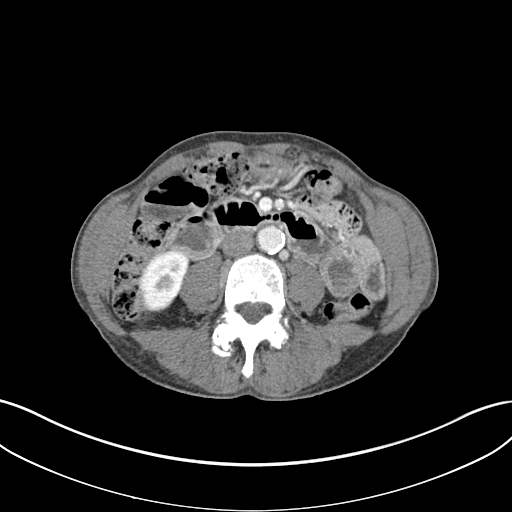
[im 57/85  soft-tissue]
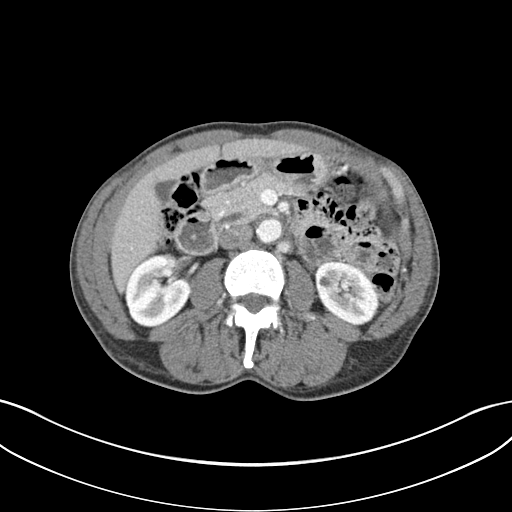
[im 57/85  bone]
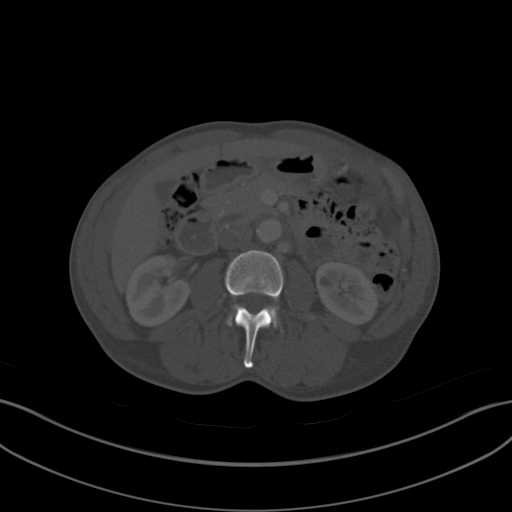
[im 68/85  soft-tissue]
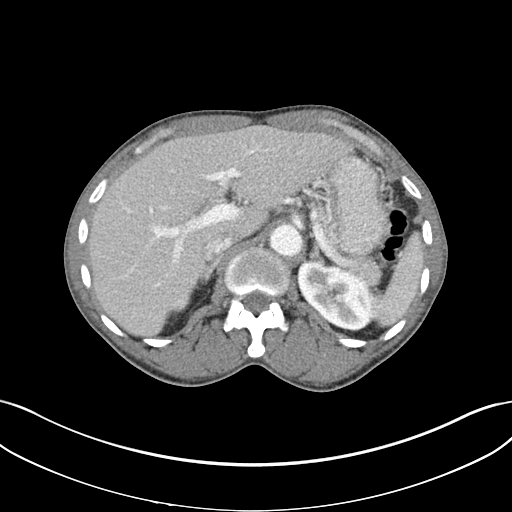
[im 73/85  soft-tissue]
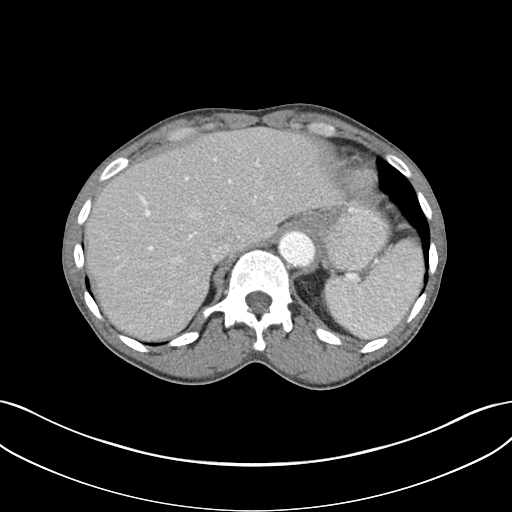
[im 79/85  soft-tissue]
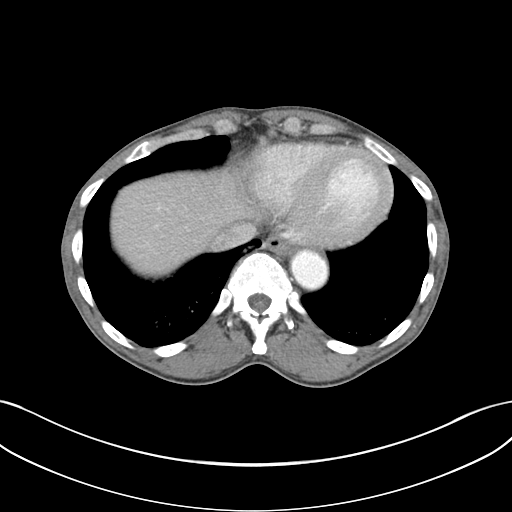

[Series 5: coronal st · coronal · 0.62mm/px · 3 of 130 slices shown]
[im 44/130  soft-tissue]
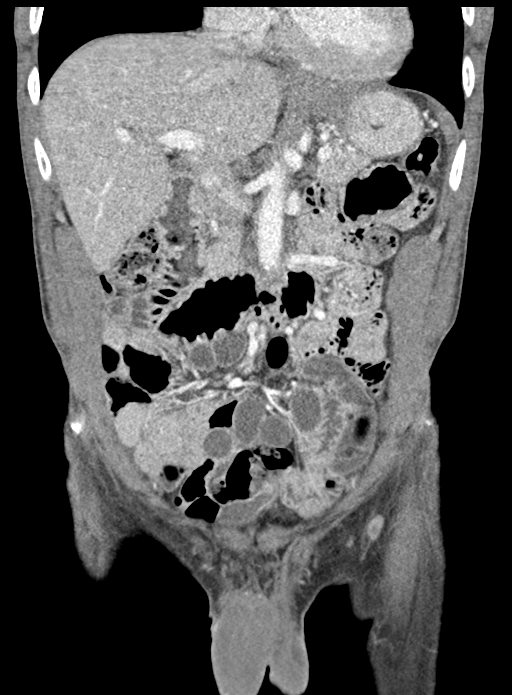
[im 58/130  soft-tissue]
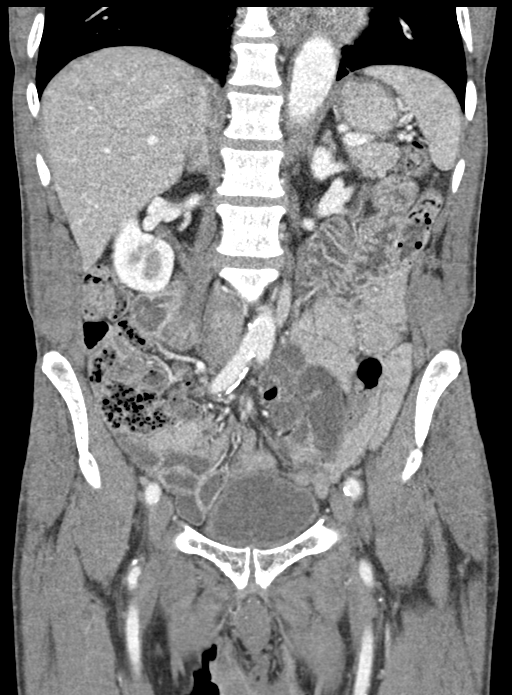
[im 72/130  soft-tissue]
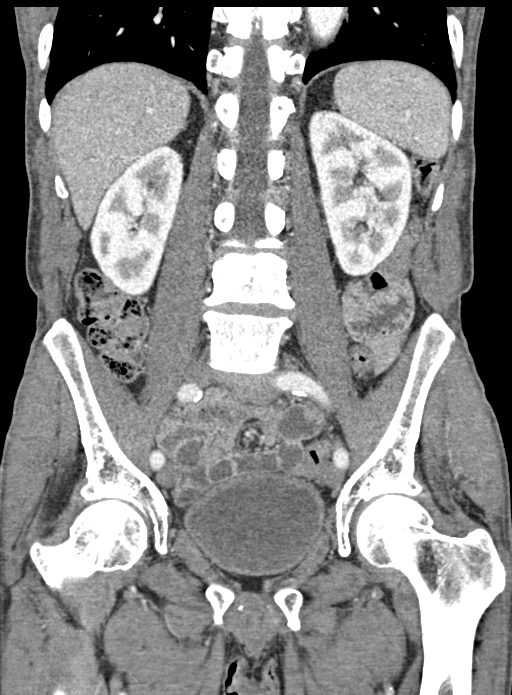

[15 of 46 positions shown; findings below may reference images not displayed]

FINDINGS: Lower chest: Bilateral lower lobe subsegmental atelectasis. Vague
centrilobular emphysematous changes. No acute abnormality.

Hepatobiliary: No focal liver abnormality. No gallstones,
gallbladder wall thickening, or pericholecystic fluid. No biliary
dilatation.

Pancreas: No focal lesion. Normal pancreatic contour. No surrounding
inflammatory changes. No main pancreatic ductal dilatation.

Spleen: Normal in size without focal abnormality.

Adrenals/Urinary Tract: No adrenal nodule bilaterally. Bilateral
kidneys enhance symmetrically. Subcentimeter hypodensities within
the kidneys are too small to characterize. No hydronephrosis. No
hydroureter. The urinary bladder is unremarkable.

Stomach/Bowel: Under distended gastric fundus and body. Stomach is
within normal limits. No evidence of bowel wall thickening or
dilatation. Appendix appears normal.

Vascular/Lymphatic: The portal, splenic, superior mesenteric veins
are patent. No abdominal aorta or iliac aneurysm. At least moderate
calcified and noncalcified atherosclerotic plaque of the aorta and
its branches. No abdominal, pelvic, or inguinal lymphadenopathy.

Reproductive: Prostate is unremarkable.

Other: No intraperitoneal free fluid. No intraperitoneal free gas.
No organized fluid collection.

Musculoskeletal:

No abdominal wall hernia or abnormality

No suspicious lytic or blastic osseous lesions. No acute displaced
fracture. Degenerative changes of the L4-L5 and L5-S1 level. Grade 1
anterolisthesis of L4 on L5.
IMPRESSION: 1. No acute intra-abdominal or intrapelvic abnormality.
2. Aortic Atherosclerosis ([GU]-[GU]) and Emphysema ([GU]-[GU]).

## 2020-08-24 MED ORDER — IOHEXOL 300 MG/ML  SOLN
100.0000 mL | Freq: Once | INTRAMUSCULAR | Status: AC | PRN
  Administered 2020-08-24: 100 mL via INTRAVENOUS

## 2020-08-24 MED ORDER — ONDANSETRON HCL 4 MG/2ML IJ SOLN
4.0000 mg | Freq: Once | INTRAMUSCULAR | Status: AC
  Administered 2020-08-24: 4 mg via INTRAVENOUS
  Filled 2020-08-24: qty 2

## 2020-08-24 MED ORDER — MORPHINE SULFATE (PF) 4 MG/ML IV SOLN
4.0000 mg | Freq: Once | INTRAVENOUS | Status: AC
  Administered 2020-08-24: 4 mg via INTRAVENOUS
  Filled 2020-08-24: qty 1

## 2020-08-24 MED ORDER — SODIUM CHLORIDE 0.9 % IV BOLUS
1000.0000 mL | Freq: Once | INTRAVENOUS | Status: AC
  Administered 2020-08-24: 1000 mL via INTRAVENOUS

## 2020-08-24 NOTE — ED Triage Notes (Addendum)
[facility] transported pt to [facility] and reports the following:  Pt walked to a friends house, asked to use the restroom, said his stomach hurt, and started sweating. Pt friend said he passed out for a few seconds. He used the restroom after that. She caught him, and he did not hit his head. Normal sinus rhythm and 12-lead.
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
## 2020-08-25 ENCOUNTER — Other Ambulatory Visit (HOSPITAL_COMMUNITY): Payer: Self-pay | Admitting: Emergency Medicine

## 2020-08-25 LAB — URINALYSIS, ROUTINE W REFLEX MICROSCOPIC
Bilirubin Urine: NEGATIVE
Glucose, UA: 50 mg/dL — AB
Hgb urine dipstick: NEGATIVE
Ketones, ur: NEGATIVE mg/dL
Nitrite: NEGATIVE
Protein, ur: 100 mg/dL — AB
Specific Gravity, Urine: 1.04 — ABNORMAL HIGH (ref 1.005–1.030)
pH: 5 (ref 5.0–8.0)

## 2020-08-25 MED ORDER — MUPIROCIN CALCIUM 2 % EX CREA: 1.0000 "application " | TOPICAL_CREAM | Freq: Two times a day (BID) | CUTANEOUS | 0 refills | Status: AC

## 2020-08-25 MED ORDER — HYDROCODONE-ACETAMINOPHEN 5-325 MG PO TABS
1.0000 | ORAL_TABLET | Freq: Three times a day (TID) | ORAL | 0 refills | Status: AC | PRN
Start: 2020-08-25 — End: 2020-08-30

## 2020-08-25 MED ORDER — HYDROCODONE-ACETAMINOPHEN 5-325 MG PO TABS: 1.0000 | ORAL_TABLET | Freq: Three times a day (TID) | ORAL | 0 refills | Status: DC | PRN

## 2020-08-25 NOTE — ED Provider Notes (Signed)
I assumed care of this patient.  Please see previous provider note for further details of Hx, PE.  Briefly patient is a 65 y.o. male who presented for syncopal episode. He had rectal pain and passed out while having a BM.  Work up thus far reassuring including EKG, CBC, CMP, Lipase. Pending UA, trop, and CT A/P.  On my assessment, patient's pain had resolved.  BP reassuring. Rectal exam w/o evidence of cellulitis or abscess. Balanitis noted.  Trop >9 hours since syncope was negative. UA with leuks and WBCs - likley from balanitis. CT negative.  The patient appears reasonably screened and/or stabilized for discharge and I doubt any other medical condition or other [facility] requiring further screening, evaluation, or treatment in the ED at this time prior to discharge. Safe for discharge with strict return precautions.  Disposition: Discharge  Condition: Good  I have discussed the results, Dx and Tx plan with the patient/family who expressed understanding and agree(s) with the plan. Discharge instructions discussed at length. The patient/family was given strict return precautions who verbalized understanding of the instructions. No further questions at time of discharge.    ED Discharge Orders         Ordered    mupirocin cream (BACTROBAN) 2 %  2 times daily        08/25/20 0044    HYDROcodone-acetaminophen (NORCO/VICODIN) 5-325 MG tablet  Every 8 hours PRN,   Status:  Discontinued        08/25/20 0044    HYDROcodone-acetaminophen (NORCO/VICODIN) 5-325 MG tablet  Every 8 hours PRN        08/25/20 0055           Follow Up: [name], [name], MD [address] [place] [address] [phone]  Call           Fatima Blank, [place] 08/25/20 [phone]

## 2020-09-09 ENCOUNTER — Other Ambulatory Visit: Payer: Self-pay | Admitting: Surgery

## 2020-09-09 ENCOUNTER — Encounter: Payer: Self-pay | Admitting: Nurse Practitioner

## 2020-09-13 ENCOUNTER — Ambulatory Visit: Payer: Medicare Other | Admitting: Infectious Disease

## 2020-09-13 ENCOUNTER — Other Ambulatory Visit: Payer: Self-pay

## 2020-09-23 ENCOUNTER — Other Ambulatory Visit: Payer: Self-pay | Admitting: Surgery

## 2020-09-23 DIAGNOSIS — C210 Malignant neoplasm of anus, unspecified: Secondary | ICD-10-CM

## 2020-09-29 ENCOUNTER — Ambulatory Visit: Payer: Medicare Other | Admitting: Nurse Practitioner

## 2020-10-22 ENCOUNTER — Encounter (HOSPITAL_COMMUNITY): Payer: Self-pay | Admitting: *Deleted

## 2020-10-22 ENCOUNTER — Emergency Department (HOSPITAL_COMMUNITY)
Admission: EM | Admit: 2020-10-22 | Discharge: 2020-10-23 | Disposition: A | Discharge status: Home / Self Care | Payer: Medicare Other | Attending: Emergency Medicine | Admitting: Emergency Medicine

## 2020-10-22 ENCOUNTER — Encounter: Payer: Self-pay | Admitting: Infectious Disease

## 2020-10-22 ENCOUNTER — Ambulatory Visit (INDEPENDENT_AMBULATORY_CARE_PROVIDER_SITE_OTHER): Payer: Medicare Other | Admitting: Infectious Disease

## 2020-10-22 ENCOUNTER — Other Ambulatory Visit: Payer: Self-pay

## 2020-10-22 VITALS — BP 163/88 | HR 79 | Temp 98.2°F | Wt 134.0 lb

## 2020-10-22 DIAGNOSIS — F17200 Nicotine dependence, unspecified, uncomplicated: Secondary | ICD-10-CM | POA: Diagnosis not present

## 2020-10-22 DIAGNOSIS — K625 Hemorrhage of anus and rectum: Secondary | ICD-10-CM

## 2020-10-22 DIAGNOSIS — B20 Human immunodeficiency virus [HIV] disease: Secondary | ICD-10-CM

## 2020-10-22 DIAGNOSIS — G8929 Other chronic pain: Secondary | ICD-10-CM | POA: Diagnosis not present

## 2020-10-22 DIAGNOSIS — Z923 Personal history of irradiation: Secondary | ICD-10-CM | POA: Diagnosis not present

## 2020-10-22 DIAGNOSIS — Z85828 Personal history of other malignant neoplasm of skin: Secondary | ICD-10-CM | POA: Diagnosis not present

## 2020-10-22 DIAGNOSIS — Z20822 Contact with and (suspected) exposure to covid-19: Secondary | ICD-10-CM | POA: Insufficient documentation

## 2020-10-22 DIAGNOSIS — R64 Cachexia: Secondary | ICD-10-CM

## 2020-10-22 DIAGNOSIS — Z21 Asymptomatic human immunodeficiency virus [HIV] infection status: Secondary | ICD-10-CM | POA: Insufficient documentation

## 2020-10-22 DIAGNOSIS — Z599 Problem related to housing and economic circumstances, unspecified: Secondary | ICD-10-CM

## 2020-10-22 DIAGNOSIS — B182 Chronic viral hepatitis C: Secondary | ICD-10-CM | POA: Diagnosis not present

## 2020-10-22 DIAGNOSIS — I10 Essential (primary) hypertension: Secondary | ICD-10-CM | POA: Insufficient documentation

## 2020-10-22 DIAGNOSIS — C801 Malignant (primary) neoplasm, unspecified: Secondary | ICD-10-CM

## 2020-10-22 DIAGNOSIS — Z85048 Personal history of other malignant neoplasm of rectum, rectosigmoid junction, and anus: Secondary | ICD-10-CM | POA: Diagnosis not present

## 2020-10-22 DIAGNOSIS — K6289 Other specified diseases of anus and rectum: Secondary | ICD-10-CM | POA: Insufficient documentation

## 2020-10-22 HISTORY — DX: Problem related to housing and economic circumstances, unspecified: Z59.9

## 2020-10-22 LAB — CBC
HCT: 42.5 % (ref 39.0–52.0)
Hemoglobin: 14.1 g/dL (ref 13.0–17.0)
MCH: 29.7 pg (ref 26.0–34.0)
MCHC: 33.2 g/dL (ref 30.0–36.0)
MCV: 89.7 fL (ref 80.0–100.0)
Platelets: 261 10*3/uL (ref 150–400)
RBC: 4.74 MIL/uL (ref 4.22–5.81)
RDW: 14.2 % (ref 11.5–15.5)
WBC: 3.7 10*3/uL — ABNORMAL LOW (ref 4.0–10.5)
nRBC: 0 % (ref 0.0–0.2)

## 2020-10-22 LAB — BASIC METABOLIC PANEL
Anion gap: 7 (ref 5–15)
BUN: 15 mg/dL (ref 8–23)
CO2: 24 mmol/L (ref 22–32)
Calcium: 8.9 mg/dL (ref 8.9–10.3)
Chloride: 107 mmol/L (ref 98–111)
Creatinine, Ser: 1.23 mg/dL (ref 0.61–1.24)
GFR, Estimated: >60 mL/min (ref >60)
Glucose, Bld: 117 mg/dL — ABNORMAL HIGH (ref 70–99)
Potassium: 4 mmol/L (ref 3.5–5.1)
Sodium: 138 mmol/L (ref 135–145)

## 2020-10-22 NOTE — ED Triage Notes (Signed)
Pt saw Dr. [name] today and he advised pt to come to ER for admission. Per Dr. [name] note pt has History of rectal cancer and bleeding from rectum and sensation of a mass in the rectum. He has seen [facility] and will need to be followed in the hospital by GI also.

## 2020-10-22 NOTE — Progress Notes (Signed)
RN escorted patient to [facility] Emergency Department per Dr. [name] via wheelchair. RN checked patient into ED and patient was taken back to triage waiting by Gerald Stabs, EMT and Denton Ar, nurse tech. Patient was alert and oriented and not in any acute distress when RN left.   Beryle Flock, RN
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
## 2020-10-23 ENCOUNTER — Other Ambulatory Visit (HOSPITAL_COMMUNITY): Payer: Self-pay | Admitting: Emergency Medicine

## 2020-10-23 ENCOUNTER — Emergency Department (HOSPITAL_COMMUNITY): Payer: Medicare Other

## 2020-10-23 DIAGNOSIS — K6289 Other specified diseases of anus and rectum: Secondary | ICD-10-CM | POA: Diagnosis not present

## 2020-10-23 LAB — SARS CORONAVIRUS 2 (TAT 6-24 HRS): SARS Coronavirus 2: NEGATIVE

## 2020-10-23 LAB — POC OCCULT BLOOD, ED: Fecal Occult Bld: NEGATIVE

## 2020-10-23 IMAGING — MR MR PELVIS W/O CM
20 of 21 series · 47 of 48 positions shown · non-contrast
Comparison: None.

CLINICAL DATA: Severe rectal pain. AIDS. Clinical suspicion for
perirectal fistula. Personal history of rectal carcinoma.

EXAM:
MRI PELVIS WITHOUT CONTRAST
TECHNIQUE: Multiplanar multisequence MR imaging of the pelvis was performed. No
intravenous contrast was administered. Ultrasound gel was
administered per rectum.

[Series 2: T2 · coronal · 5.0mm · 1.41mm/px · 1 of 30 slices shown (1 of 10)]
[im 1/30]
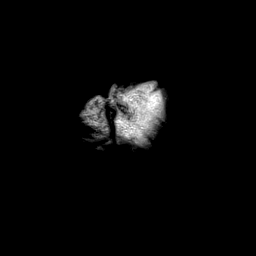

[Series 3: T2 · sagittal · 3.0mm · 1.50mm/px · 4 of 90 slices shown (2 of 10)]
[im 1/90]
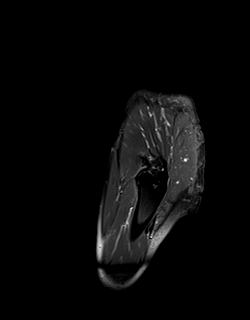
[im 30/90]
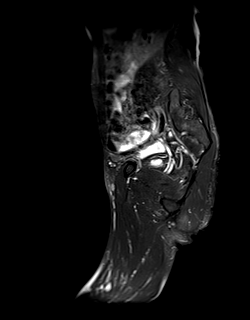
[im 60/90]
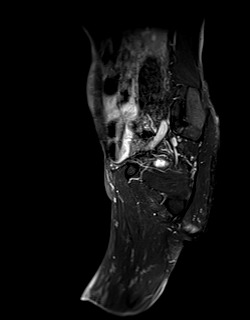
[im 90/90]
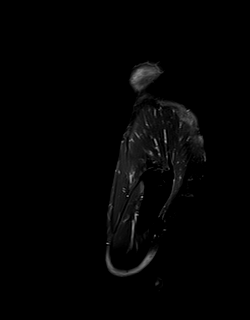

[Series 4: T2 · sagittal · 3.0mm · 0.85mm/px · 1 of 25 slices shown (3 of 10)]
[im 1/25]
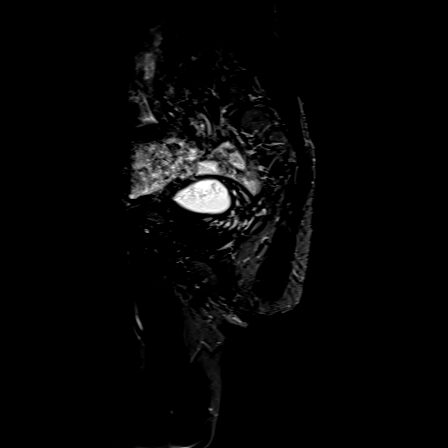

[Series 5: T2 · axial · 4.5mm · 0.62mm/px · 1 of 25 slices shown (4 of 10)]
[im 1/25]
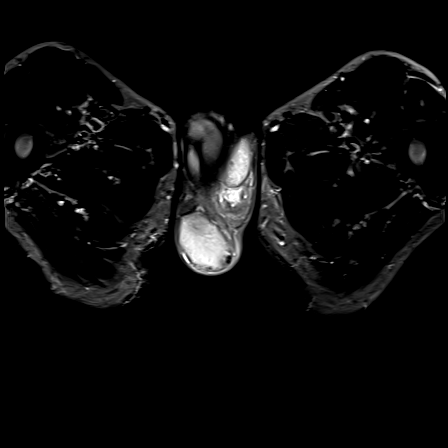

[Series 6: ax in&out whole · axial · 5.0mm · 0.74mm/px · z∈[-191,+13]mm · 2 of 35 slices shown (1 of 2)]
[im 1/35]
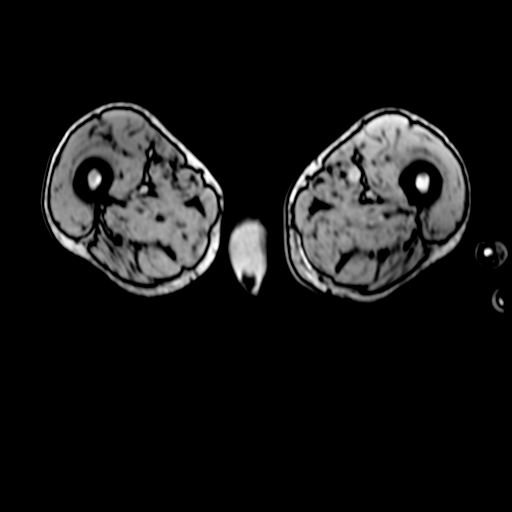
[im 35/35]
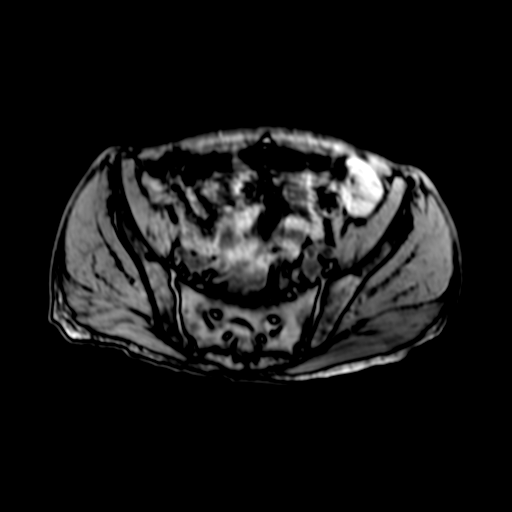

[Series 6: ax in&out whole · axial · 5.0mm · 0.74mm/px · z∈[-191,+13]mm · 2 of 35 slices shown (2 of 2)]
[im 1/35]
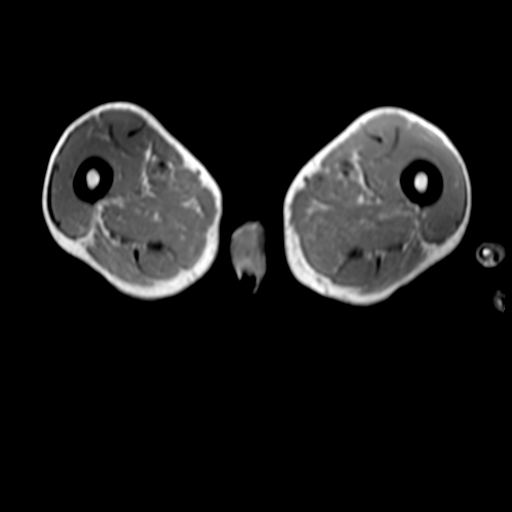
[im 35/35]
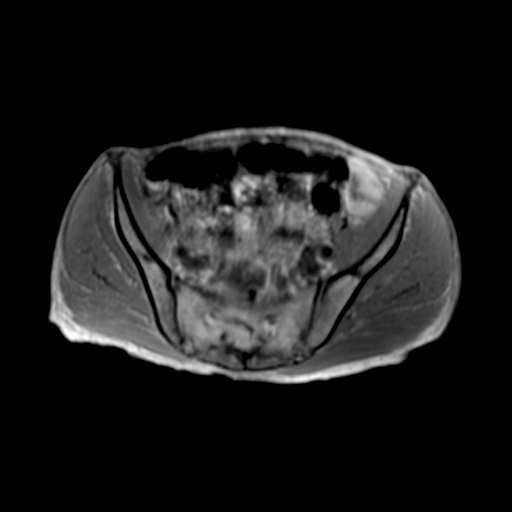

[Series 7: t2_space_tra_p2_288 · axial · 1.0mm · 1.04mm/px · z∈[-135,-16]mm · 5 of 120 slices shown (1 of 2)]
[im 1/120]
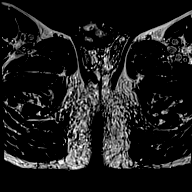
[im 30/120]
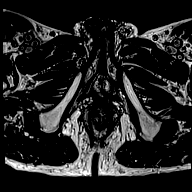
[im 60/120]
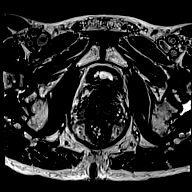
[im 90/120]
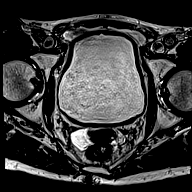
[im 120/120]
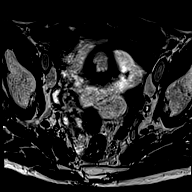

[Series 8: ep2d_diff_b50_400_800_tra_p2_tracew · axial · 3.0mm · 0.78mm/px · z∈[-134,-17]mm · 5 of 120 slices shown (1 of 2)]
[im 1/120]
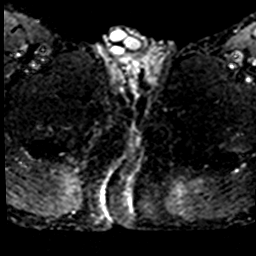
[im 30/120]
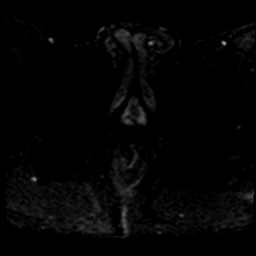
[im 60/120]
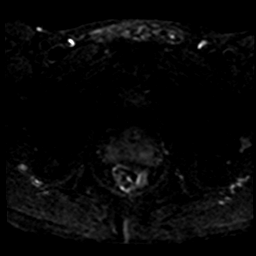
[im 90/120]
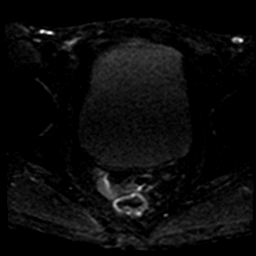
[im 120/120]
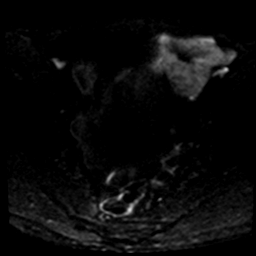

[Series 9: ep2d_diff_b50_400_800_tra_p2_adc · axial · 3.0mm · 0.78mm/px · z∈[-134,-17]mm · 2 of 40 slices shown (1 of 2)]
[im 1/40]
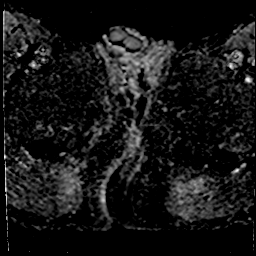
[im 40/40]
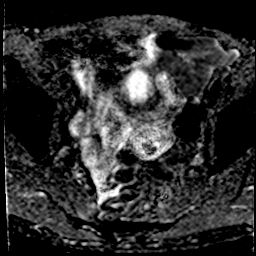

[Series 10: ep2d_diff_b50_400_800_tra_p2_calc_bval · axial · 3.0mm · 0.78mm/px · z∈[-134,-17]mm · 2 of 40 slices shown (1 of 2)]
[im 1/40]
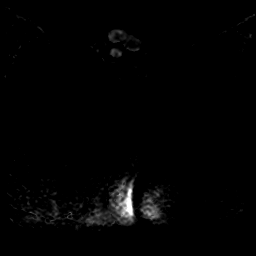
[im 40/40]
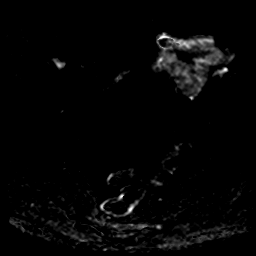

[Series 11: T2 · axial · 3.0mm · 0.40mm/px · z∈[-61,+40]mm · 2 of 43 slices shown (5 of 10)]
[im 1/43]
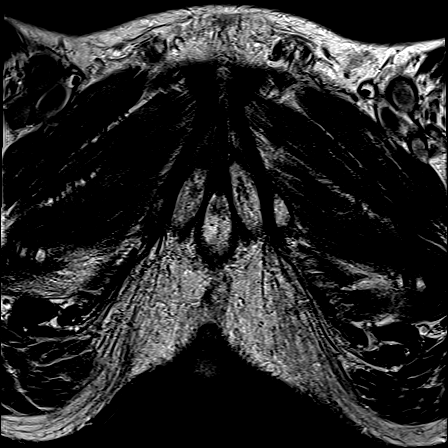
[im 43/43]
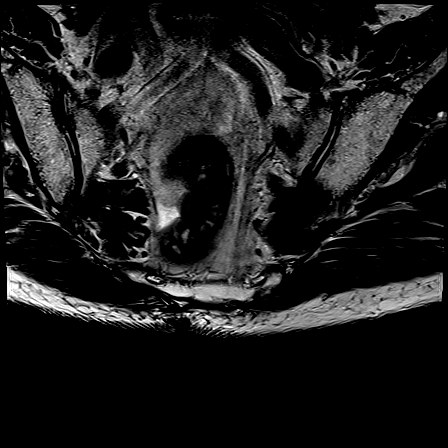

[Series 12: T2 · coronal · 3.0mm · 0.54mm/px · 1 of 31 slices shown (6 of 10)]
[im 1/31]
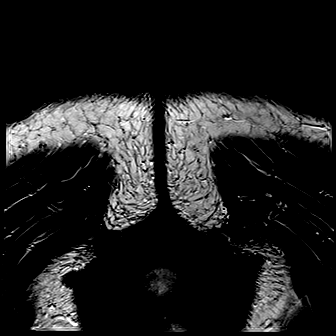

[Series 14: T2 · sagittal · 3.0mm · 0.85mm/px · 1 of 25 slices shown (7 of 10)]
[im 1/25]
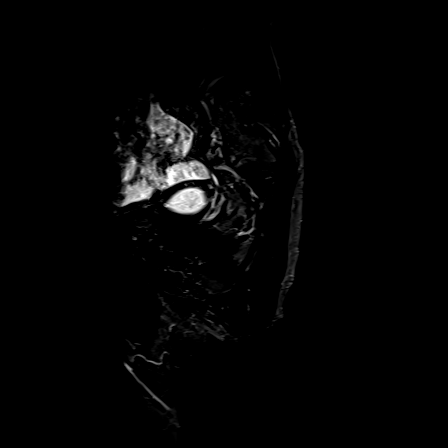

[Series 15: T2 · axial · 3.0mm · 0.40mm/px · z∈[-100,+1]mm · 2 of 43 slices shown (8 of 10)]
[im 1/43]
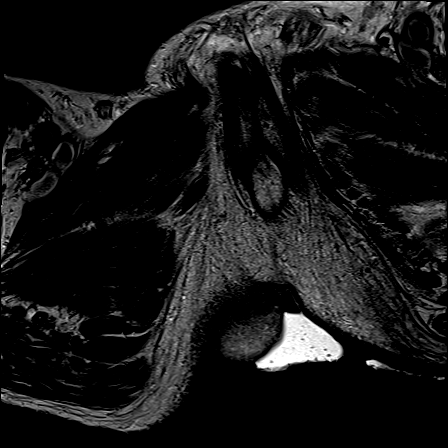
[im 43/43]
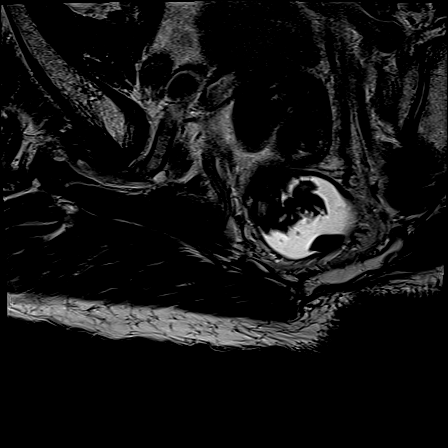

[Series 16: T2 · coronal · 5.0mm · 1.17mm/px · 1 of 28 slices shown (9 of 10)]
[im 1/28]
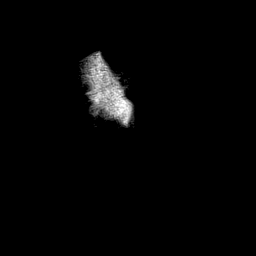

[Series 17: ep2d_diff_b50_400_800_tra_p2_tracew · axial · 3.0mm · 0.78mm/px · z∈[-168,-51]mm · 5 of 118 slices shown (2 of 2)]
[im 1/118]
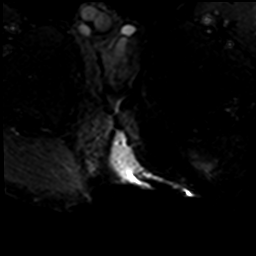
[im 30/118]
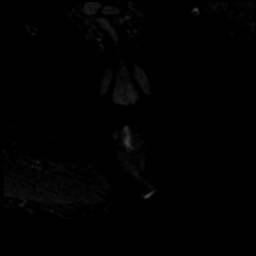
[im 59/118]
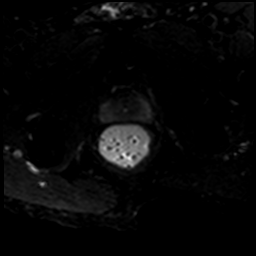
[im 88/118]
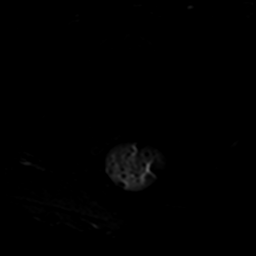
[im 118/118]
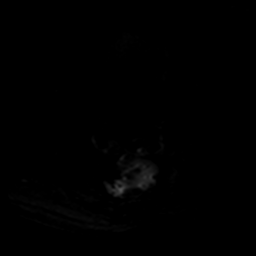

[Series 18: ep2d_diff_b50_400_800_tra_p2_adc · axial · 3.0mm · 0.78mm/px · z∈[-168,-51]mm · 2 of 40 slices shown (2 of 2)]
[im 1/40]
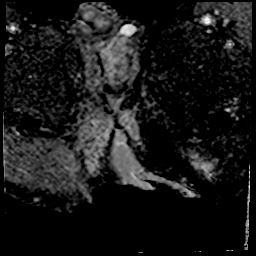
[im 40/40]
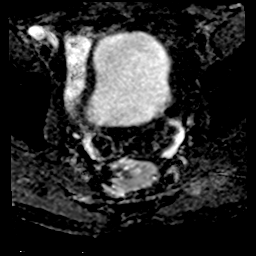

[Series 19: ep2d_diff_b50_400_800_tra_p2_calc_bval · axial · 3.0mm · 0.78mm/px · z∈[-168,-51]mm · 2 of 40 slices shown (2 of 2)]
[im 1/40]
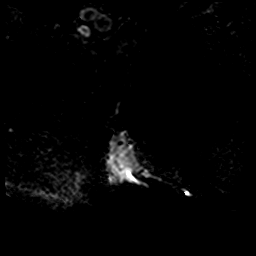
[im 40/40]
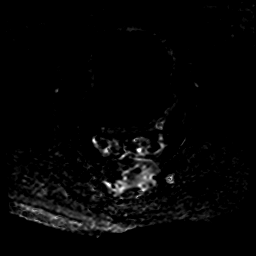

[Series 20: T2 · oblique · 3.0mm · 0.54mm/px · 1 of 31 slices shown (10 of 10)]
[im 1/31]
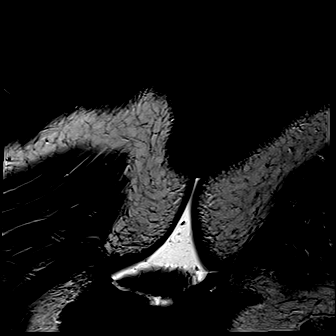

[Series 21: t2_space_tra_p2_288 · axial · 1.0mm · 1.04mm/px · z∈[-165,-46]mm · 5 of 120 slices shown (2 of 2)]
[im 1/120]
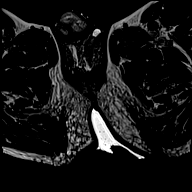
[im 30/120]
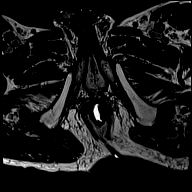
[im 60/120]
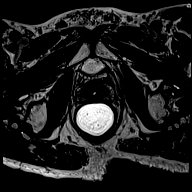
[im 90/120]
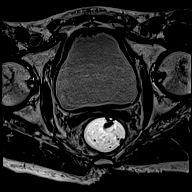
[im 120/120]
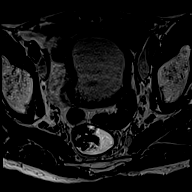

[47 of 48 positions shown; findings below may reference images not displayed]

FINDINGS: Lower Urinary Tract: Unremarkable urinary bladder.

Bowel: No evidence of perianal fistula or abscess. No rectal mass
identified. Other visualized pelvic bowel loops are unremarkable.

Vascular/Lymphatic: No pathologically enlarged lymph nodes or other
significant abnormality seen in lower pelvis.

Reproductive:  Normal size prostate and seminal vesicles.

Other: Small amount of free fluid is noted in the pelvis which is
nonspecific.

Musculoskeletal: No significant abnormality identified.
IMPRESSION: No acute findings.  No evidence of perianal fistula or abscess.

No evidence of rectal mass or pelvic metastatic disease.

Small amount of free fluid in the pelvis, nonspecific.

## 2020-10-23 MED ORDER — OXYCODONE-ACETAMINOPHEN 5-325 MG PO TABS: 2.0000 | ORAL_TABLET | Freq: Four times a day (QID) | ORAL | 0 refills | Status: DC | PRN

## 2020-10-23 MED ORDER — SODIUM CHLORIDE 0.9 % IV BOLUS
1000.0000 mL | Freq: Once | INTRAVENOUS | Status: AC
  Administered 2020-10-23: 1000 mL via INTRAVENOUS

## 2020-10-23 MED ORDER — HYDROCODONE-ACETAMINOPHEN 5-325 MG PO TABS
1.0000 | ORAL_TABLET | Freq: Once | ORAL | Status: AC
  Administered 2020-10-23: 1 via ORAL
  Filled 2020-10-23: qty 1
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
## 2020-10-23 NOTE — ED Provider Notes (Signed)
MR has been read preliminariliy by Dr. Katherine Basset - I appreciate his willingness to read this difficulty study, and will be over read by radiologist specializing in this on Monday.  The MRI does not show any emergent findings, the patient's vital signs are unremarkable, his lab work is unremarkable, he has been sitting up on the bed without any significant obvious pain, no tachycardia, no fever, he has eaten and had food and water without any difficulty, I offered the patient pain medication, he states that he wants to go home, he wants to follow-up, he does not want to wait in the hospital.  The MRI will not be read until Monday formally  The patient can follow-up with infectious disease, there is no indication for admission, pain medication has been ordered to the patient's pharmacy.  I offered to change his pharmacy to one that is open on the weekend but he wants to keep it at [facility] understanding it may not be ready until Monday   Noemi Chapel, MD 10/23/20 1800

## 2020-10-23 NOTE — Discharge Instructions (Addendum)
You can follow up with Dr. Wendie Agreste on Monday - your MRI will not be read until Monday - Oxycocodone for severe pain only.  Maximum 2 pills every 6 hours - this may make you constipated - take a stool softener.  The pain that you are having may be related to recurrent cancer so make sure that you follow-up very closely.  Call the office on Monday morning to make sure that there is a schedule appointment for you this coming week  ER for worsening symptoms

## 2020-10-23 NOTE — ED Provider Notes (Signed)
65 yo male with ho rectal pain, ho rectal ca, presents with ongoing rectal pain.  Sent from ID clinic for exam because it was too painful to have surgery see Physical Exam  BP (!) 161/74   Pulse 71   Temp 98.5 F (36.9 C) (Oral)   Resp 16   Wt 60.8 kg   SpO2 99%   BMI 20.99 kg/m   Physical Exam  ED Course/Procedures     Procedures  MDM  Patient here with rectal pain.  Exam today with some tissue break down in perianal region.  MRI for rectal cancer protocol pending. hemocult negative. Dispo pending mri results. Patient hungry and thirsty.  He states he has not eaten since yesterday.  He still pending MRI.  We will give IV fluids as he has IV in place. 3:42 PM MRI completed and pending results.  Patient continues to asked to be fed. Patient signed out to Dr. Leonel Ramsay, MD 10/23/20 [phone]

## 2020-11-05 ENCOUNTER — Other Ambulatory Visit: Payer: Self-pay

## 2020-11-05 DIAGNOSIS — B20 Human immunodeficiency virus [HIV] disease: Secondary | ICD-10-CM

## 2020-11-05 DIAGNOSIS — Z79899 Other long term (current) drug therapy: Secondary | ICD-10-CM

## 2020-11-12 ENCOUNTER — Other Ambulatory Visit: Payer: Medicare Other

## 2020-11-12 ENCOUNTER — Other Ambulatory Visit: Payer: Self-pay | Admitting: Infectious Disease

## 2020-11-12 DIAGNOSIS — B20 Human immunodeficiency virus [HIV] disease: Secondary | ICD-10-CM

## 2020-11-12 NOTE — Progress Notes (Signed)
[id number]

## 2020-11-16 ENCOUNTER — Emergency Department (HOSPITAL_COMMUNITY): Payer: Medicare Other

## 2020-11-16 ENCOUNTER — Encounter (HOSPITAL_COMMUNITY): Payer: Self-pay | Admitting: Emergency Medicine

## 2020-11-16 ENCOUNTER — Other Ambulatory Visit: Payer: Self-pay

## 2020-11-16 ENCOUNTER — Inpatient Hospital Stay (HOSPITAL_COMMUNITY)
Admission: EM | Admit: 2020-11-16 | Discharge: 2020-12-09 | DRG: 064 | Disposition: A | Discharge status: Hospice | Payer: Medicare Other | Attending: Internal Medicine | Admitting: Internal Medicine

## 2020-11-16 ENCOUNTER — Inpatient Hospital Stay (HOSPITAL_COMMUNITY): Payer: Medicare Other

## 2020-11-16 DIAGNOSIS — L139 Bullous disorder, unspecified: Secondary | ICD-10-CM | POA: Diagnosis not present

## 2020-11-16 DIAGNOSIS — R578 Other shock: Secondary | ICD-10-CM | POA: Diagnosis not present

## 2020-11-16 DIAGNOSIS — E43 Unspecified severe protein-calorie malnutrition: Secondary | ICD-10-CM | POA: Diagnosis present

## 2020-11-16 DIAGNOSIS — E870 Hyperosmolality and hypernatremia: Secondary | ICD-10-CM | POA: Diagnosis not present

## 2020-11-16 DIAGNOSIS — I21A1 Myocardial infarction type 2: Secondary | ICD-10-CM | POA: Diagnosis present

## 2020-11-16 DIAGNOSIS — I639 Cerebral infarction, unspecified: Secondary | ICD-10-CM | POA: Diagnosis not present

## 2020-11-16 DIAGNOSIS — K921 Melena: Secondary | ICD-10-CM | POA: Diagnosis not present

## 2020-11-16 DIAGNOSIS — R29718 NIHSS score 18: Secondary | ICD-10-CM | POA: Diagnosis present

## 2020-11-16 DIAGNOSIS — R64 Cachexia: Secondary | ICD-10-CM | POA: Diagnosis present

## 2020-11-16 DIAGNOSIS — I110 Hypertensive heart disease with heart failure: Secondary | ICD-10-CM | POA: Diagnosis present

## 2020-11-16 DIAGNOSIS — S36119A Unspecified injury of liver, initial encounter: Secondary | ICD-10-CM | POA: Diagnosis not present

## 2020-11-16 DIAGNOSIS — I472 Ventricular tachycardia: Secondary | ICD-10-CM | POA: Diagnosis present

## 2020-11-16 DIAGNOSIS — E512 Wernicke's encephalopathy: Secondary | ICD-10-CM | POA: Diagnosis present

## 2020-11-16 DIAGNOSIS — E875 Hyperkalemia: Secondary | ICD-10-CM | POA: Diagnosis present

## 2020-11-16 DIAGNOSIS — T796XXA Traumatic ischemia of muscle, initial encounter: Secondary | ICD-10-CM | POA: Diagnosis present

## 2020-11-16 DIAGNOSIS — L8942 Pressure ulcer of contiguous site of back, buttock and hip, stage 2: Secondary | ICD-10-CM | POA: Diagnosis not present

## 2020-11-16 DIAGNOSIS — R1312 Dysphagia, oropharyngeal phase: Secondary | ICD-10-CM | POA: Diagnosis not present

## 2020-11-16 DIAGNOSIS — I161 Hypertensive emergency: Secondary | ICD-10-CM | POA: Diagnosis present

## 2020-11-16 DIAGNOSIS — Z7189 Other specified counseling: Secondary | ICD-10-CM

## 2020-11-16 DIAGNOSIS — I5022 Chronic systolic (congestive) heart failure: Secondary | ICD-10-CM | POA: Diagnosis present

## 2020-11-16 DIAGNOSIS — B20 Human immunodeficiency virus [HIV] disease: Secondary | ICD-10-CM | POA: Diagnosis present

## 2020-11-16 DIAGNOSIS — I429 Cardiomyopathy, unspecified: Secondary | ICD-10-CM | POA: Diagnosis present

## 2020-11-16 DIAGNOSIS — L138 Other specified bullous disorders: Secondary | ICD-10-CM | POA: Diagnosis not present

## 2020-11-16 DIAGNOSIS — G9340 Encephalopathy, unspecified: Secondary | ICD-10-CM | POA: Diagnosis present

## 2020-11-16 DIAGNOSIS — M6282 Rhabdomyolysis: Secondary | ICD-10-CM | POA: Diagnosis not present

## 2020-11-16 DIAGNOSIS — F1410 Cocaine abuse, uncomplicated: Secondary | ICD-10-CM | POA: Diagnosis not present

## 2020-11-16 DIAGNOSIS — Z0189 Encounter for other specified special examinations: Secondary | ICD-10-CM

## 2020-11-16 DIAGNOSIS — Z66 Do not resuscitate: Secondary | ICD-10-CM | POA: Diagnosis not present

## 2020-11-16 DIAGNOSIS — F32A Depression, unspecified: Secondary | ICD-10-CM | POA: Diagnosis not present

## 2020-11-16 DIAGNOSIS — I6340 Cerebral infarction due to embolism of unspecified cerebral artery: Secondary | ICD-10-CM | POA: Diagnosis present

## 2020-11-16 DIAGNOSIS — X58XXXA Exposure to other specified factors, initial encounter: Secondary | ICD-10-CM | POA: Diagnosis present

## 2020-11-16 DIAGNOSIS — Z5900 Homelessness unspecified: Secondary | ICD-10-CM

## 2020-11-16 DIAGNOSIS — F17210 Nicotine dependence, cigarettes, uncomplicated: Secondary | ICD-10-CM | POA: Diagnosis present

## 2020-11-16 DIAGNOSIS — B182 Chronic viral hepatitis C: Secondary | ICD-10-CM | POA: Diagnosis not present

## 2020-11-16 DIAGNOSIS — Z4659 Encounter for fitting and adjustment of other gastrointestinal appliance and device: Secondary | ICD-10-CM

## 2020-11-16 DIAGNOSIS — F1010 Alcohol abuse, uncomplicated: Secondary | ICD-10-CM | POA: Diagnosis not present

## 2020-11-16 DIAGNOSIS — E871 Hypo-osmolality and hyponatremia: Secondary | ICD-10-CM | POA: Diagnosis present

## 2020-11-16 DIAGNOSIS — N179 Acute kidney failure, unspecified: Secondary | ICD-10-CM

## 2020-11-16 DIAGNOSIS — K148 Other diseases of tongue: Secondary | ICD-10-CM | POA: Diagnosis not present

## 2020-11-16 DIAGNOSIS — Y92009 Unspecified place in unspecified non-institutional (private) residence as the place of occurrence of the external cause: Secondary | ICD-10-CM

## 2020-11-16 DIAGNOSIS — E785 Hyperlipidemia, unspecified: Secondary | ICD-10-CM | POA: Diagnosis present

## 2020-11-16 DIAGNOSIS — Z20822 Contact with and (suspected) exposure to covid-19: Secondary | ICD-10-CM | POA: Diagnosis present

## 2020-11-16 DIAGNOSIS — K626 Ulcer of anus and rectum: Secondary | ICD-10-CM | POA: Diagnosis present

## 2020-11-16 DIAGNOSIS — F1920 Other psychoactive substance dependence, uncomplicated: Secondary | ICD-10-CM | POA: Diagnosis not present

## 2020-11-16 DIAGNOSIS — N170 Acute kidney failure with tubular necrosis: Secondary | ICD-10-CM | POA: Diagnosis present

## 2020-11-16 DIAGNOSIS — L120 Bullous pemphigoid: Secondary | ICD-10-CM | POA: Diagnosis present

## 2020-11-16 DIAGNOSIS — C20 Malignant neoplasm of rectum: Secondary | ICD-10-CM | POA: Diagnosis not present

## 2020-11-16 DIAGNOSIS — L958 Other vasculitis limited to the skin: Secondary | ICD-10-CM | POA: Diagnosis not present

## 2020-11-16 DIAGNOSIS — Z85048 Personal history of other malignant neoplasm of rectum, rectosigmoid junction, and anus: Secondary | ICD-10-CM | POA: Diagnosis not present

## 2020-11-16 DIAGNOSIS — L8990 Pressure ulcer of unspecified site, unspecified stage: Secondary | ICD-10-CM | POA: Insufficient documentation

## 2020-11-16 DIAGNOSIS — L89890 Pressure ulcer of other site, unstageable: Secondary | ICD-10-CM | POA: Diagnosis present

## 2020-11-16 DIAGNOSIS — R159 Full incontinence of feces: Secondary | ICD-10-CM | POA: Diagnosis not present

## 2020-11-16 DIAGNOSIS — G931 Anoxic brain damage, not elsewhere classified: Secondary | ICD-10-CM | POA: Diagnosis present

## 2020-11-16 DIAGNOSIS — K120 Recurrent oral aphthae: Secondary | ICD-10-CM | POA: Diagnosis present

## 2020-11-16 DIAGNOSIS — R21 Rash and other nonspecific skin eruption: Secondary | ICD-10-CM | POA: Diagnosis present

## 2020-11-16 DIAGNOSIS — L89310 Pressure ulcer of right buttock, unstageable: Secondary | ICD-10-CM | POA: Diagnosis present

## 2020-11-16 DIAGNOSIS — K625 Hemorrhage of anus and rectum: Secondary | ICD-10-CM | POA: Diagnosis not present

## 2020-11-16 DIAGNOSIS — I351 Nonrheumatic aortic (valve) insufficiency: Secondary | ICD-10-CM | POA: Diagnosis present

## 2020-11-16 DIAGNOSIS — E872 Acidosis: Secondary | ICD-10-CM | POA: Diagnosis present

## 2020-11-16 DIAGNOSIS — I10 Essential (primary) hypertension: Secondary | ICD-10-CM | POA: Diagnosis not present

## 2020-11-16 DIAGNOSIS — I5041 Acute combined systolic (congestive) and diastolic (congestive) heart failure: Secondary | ICD-10-CM

## 2020-11-16 DIAGNOSIS — G9349 Other encephalopathy: Secondary | ICD-10-CM | POA: Diagnosis not present

## 2020-11-16 DIAGNOSIS — I214 Non-ST elevation (NSTEMI) myocardial infarction: Secondary | ICD-10-CM | POA: Diagnosis not present

## 2020-11-16 DIAGNOSIS — R000 Tachycardia, unspecified: Secondary | ICD-10-CM | POA: Diagnosis not present

## 2020-11-16 DIAGNOSIS — Z515 Encounter for palliative care: Secondary | ICD-10-CM

## 2020-11-16 DIAGNOSIS — I6389 Other cerebral infarction: Secondary | ICD-10-CM | POA: Diagnosis not present

## 2020-11-16 DIAGNOSIS — R197 Diarrhea, unspecified: Secondary | ICD-10-CM | POA: Diagnosis not present

## 2020-11-16 DIAGNOSIS — I6501 Occlusion and stenosis of right vertebral artery: Secondary | ICD-10-CM | POA: Diagnosis present

## 2020-11-16 DIAGNOSIS — L89150 Pressure ulcer of sacral region, unstageable: Secondary | ICD-10-CM | POA: Diagnosis present

## 2020-11-16 DIAGNOSIS — Z6821 Body mass index (BMI) 21.0-21.9, adult: Secondary | ICD-10-CM

## 2020-11-16 DIAGNOSIS — L89320 Pressure ulcer of left buttock, unstageable: Secondary | ICD-10-CM | POA: Diagnosis present

## 2020-11-16 DIAGNOSIS — R471 Dysarthria and anarthria: Secondary | ICD-10-CM | POA: Diagnosis present

## 2020-11-16 LAB — BASIC METABOLIC PANEL
Anion gap: 17 — ABNORMAL HIGH (ref 5–15)
BUN: 86 mg/dL — ABNORMAL HIGH (ref 8–23)
CO2: 17 mmol/L — ABNORMAL LOW (ref 22–32)
Calcium: 8.5 mg/dL — ABNORMAL LOW (ref 8.9–10.3)
Chloride: 109 mmol/L (ref 98–111)
Creatinine, Ser: 3.32 mg/dL — ABNORMAL HIGH (ref 0.61–1.24)
GFR, Estimated: 20 mL/min — ABNORMAL LOW (ref >60)
Glucose, Bld: 91 mg/dL (ref 70–99)
Potassium: 4.6 mmol/L (ref 3.5–5.1)
Sodium: 143 mmol/L (ref 135–145)

## 2020-11-16 LAB — URINALYSIS, ROUTINE W REFLEX MICROSCOPIC
Bacteria, UA: NONE SEEN
Bilirubin Urine: NEGATIVE
Glucose, UA: NEGATIVE mg/dL
Ketones, ur: NEGATIVE mg/dL
Leukocytes,Ua: NEGATIVE
Nitrite: NEGATIVE
Protein, ur: 100 mg/dL — AB
Specific Gravity, Urine: 1.014 (ref 1.005–1.030)
pH: 5 (ref 5.0–8.0)

## 2020-11-16 LAB — T-HELPER CELLS (CD4) COUNT (NOT AT ARMC)
CD4 % Helper T Cell: 23 % — ABNORMAL LOW (ref 33–65)
CD4 T Cell Abs: 197 /uL — ABNORMAL LOW (ref 400–1790)

## 2020-11-16 LAB — COMPREHENSIVE METABOLIC PANEL
ALT: 113 U/L — ABNORMAL HIGH (ref 0–44)
AST: 254 U/L — ABNORMAL HIGH (ref 15–41)
Albumin: 3.3 g/dL — ABNORMAL LOW (ref 3.5–5.0)
Alkaline Phosphatase: 75 U/L (ref 38–126)
Anion gap: 17 — ABNORMAL HIGH (ref 5–15)
BUN: 95 mg/dL — ABNORMAL HIGH (ref 8–23)
CO2: 19 mmol/L — ABNORMAL LOW (ref 22–32)
Calcium: 8.9 mg/dL (ref 8.9–10.3)
Chloride: 105 mmol/L (ref 98–111)
Creatinine, Ser: 5.21 mg/dL — ABNORMAL HIGH (ref 0.61–1.24)
GFR, Estimated: 11 mL/min — ABNORMAL LOW (ref >60)
Glucose, Bld: 105 mg/dL — ABNORMAL HIGH (ref 70–99)
Potassium: 5.9 mmol/L — ABNORMAL HIGH (ref 3.5–5.1)
Sodium: 141 mmol/L (ref 135–145)
Total Bilirubin: 1.1 mg/dL (ref 0.3–1.2)
Total Protein: 8.6 g/dL — ABNORMAL HIGH (ref 6.5–8.1)

## 2020-11-16 LAB — CBC WITH DIFFERENTIAL/PLATELET
Abs Immature Granulocytes: 0.14 10*3/uL — ABNORMAL HIGH (ref 0.00–0.07)
Basophils Absolute: 0 10*3/uL (ref 0.0–0.1)
Basophils Relative: 0 %
Eosinophils Absolute: 0.1 10*3/uL (ref 0.0–0.5)
Eosinophils Relative: 0 %
HCT: 49.4 % (ref 39.0–52.0)
Hemoglobin: 15.8 g/dL (ref 13.0–17.0)
Immature Granulocytes: 1 %
Lymphocytes Relative: 4 %
Lymphs Abs: 0.7 10*3/uL (ref 0.7–4.0)
MCH: 29.5 pg (ref 26.0–34.0)
MCHC: 32 g/dL (ref 30.0–36.0)
MCV: 92.3 fL (ref 80.0–100.0)
Monocytes Absolute: 1.8 10*3/uL — ABNORMAL HIGH (ref 0.1–1.0)
Monocytes Relative: 10 %
Neutro Abs: 15.9 10*3/uL — ABNORMAL HIGH (ref 1.7–7.7)
Neutrophils Relative %: 85 %
Platelets: 293 10*3/uL (ref 150–400)
RBC: 5.35 MIL/uL (ref 4.22–5.81)
RDW: 15.4 % (ref 11.5–15.5)
WBC: 18.7 10*3/uL — ABNORMAL HIGH (ref 4.0–10.5)
nRBC: 0 % (ref 0.0–0.2)

## 2020-11-16 LAB — RAPID URINE DRUG SCREEN, HOSP PERFORMED
Amphetamines: NOT DETECTED
Barbiturates: NOT DETECTED
Benzodiazepines: NOT DETECTED
Cocaine: POSITIVE — AB
Opiates: NOT DETECTED
Tetrahydrocannabinol: NOT DETECTED

## 2020-11-16 LAB — LIPASE, BLOOD: Lipase: 28 U/L (ref 11–51)

## 2020-11-16 LAB — RESP PANEL BY RT-PCR (FLU A&B, COVID) ARPGX2
Influenza A by PCR: NEGATIVE
Influenza B by PCR: NEGATIVE
SARS Coronavirus 2 by RT PCR: NEGATIVE

## 2020-11-16 LAB — LACTIC ACID, PLASMA
Lactic Acid, Venous: 3.9 mmol/L (ref 0.5–1.9)
Lactic Acid, Venous: 3.5 mmol/L (ref 0.5–1.9)

## 2020-11-16 LAB — PROTIME-INR
INR: 1.1 (ref 0.8–1.2)
Prothrombin Time: 14.3 seconds (ref 11.4–15.2)

## 2020-11-16 LAB — CK
Total CK: 9003 U/L — ABNORMAL HIGH (ref 49–397)
Total CK: 8609 U/L — ABNORMAL HIGH (ref 49–397)

## 2020-11-16 LAB — CRYPTOCOCCAL ANTIGEN: Crypto Ag: NEGATIVE

## 2020-11-16 LAB — ETHANOL: Alcohol, Ethyl (B): <10 mg/dL (ref <10)

## 2020-11-16 LAB — TROPONIN I (HIGH SENSITIVITY): Troponin I (High Sensitivity): 149 ng/L (ref <18)

## 2020-11-16 LAB — POC OCCULT BLOOD, ED: Fecal Occult Bld: POSITIVE — AB

## 2020-11-16 LAB — AMMONIA: Ammonia: 70 umol/L — ABNORMAL HIGH (ref 9–35)

## 2020-11-16 IMAGING — CT CT HEAD W/O CM
4 series · 16 of 47 positions shown, 18 images · non-contrast
Comparison: Head CT [DATE].

CLINICAL DATA: Altered mental status. History of anorectal
carcinoma.

EXAM:
CT HEAD WITHOUT CONTRAST
TECHNIQUE: Contiguous axial images were obtained from the base of the skull
through the vertex without intravenous contrast.

[Series 3: head without · axial · non-contrast · 0.45mm/px · z∈[-135,-5]mm · 7 of 36 slices shown, 9 images]
[im 5/36  brain]
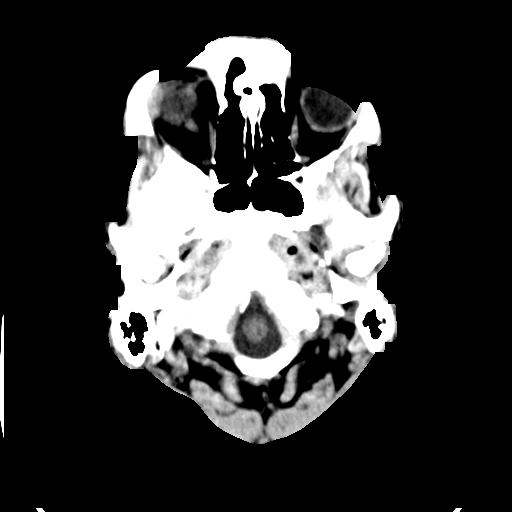
[im 5/36  bone]
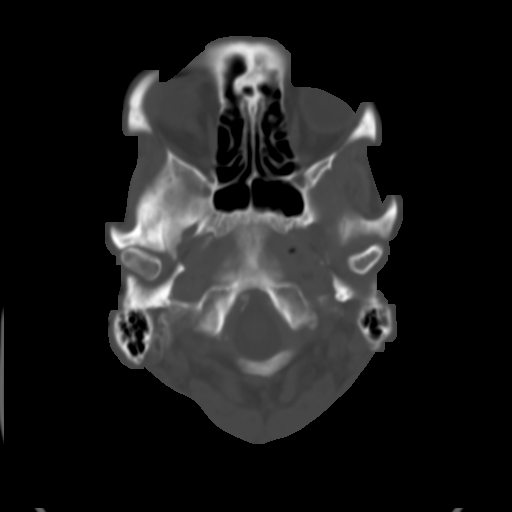
[im 9/36  brain]
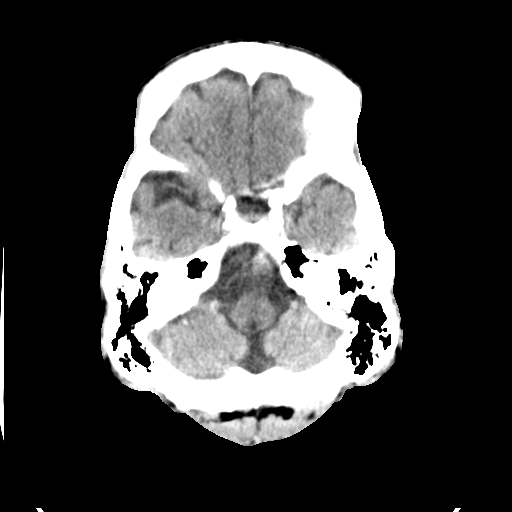
[im 14/36  brain]
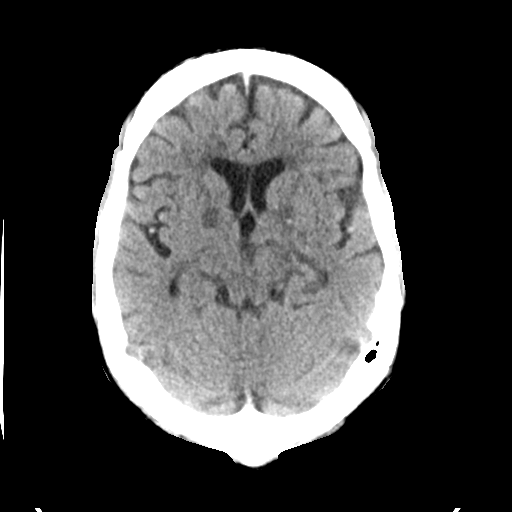
[im 18/36  brain]
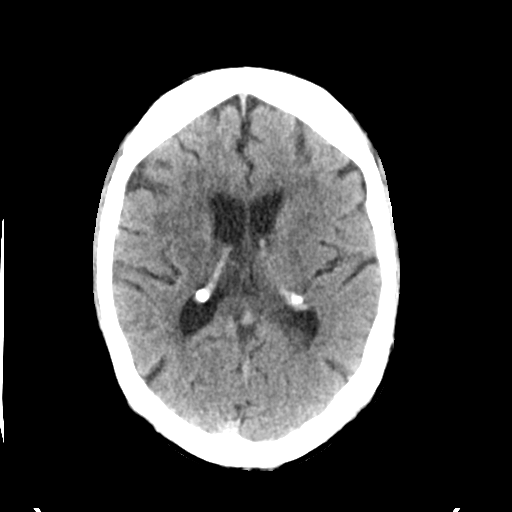
[im 22/36  brain]
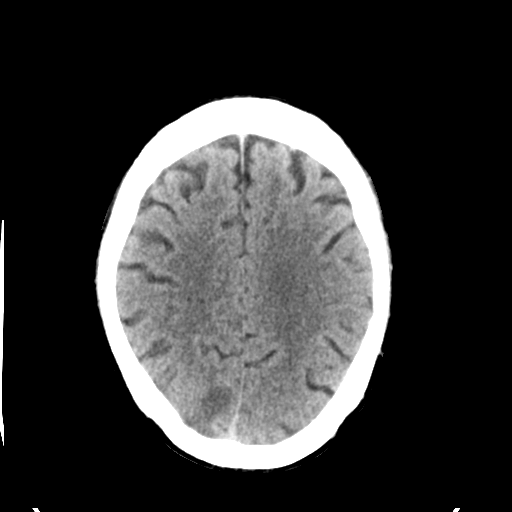
[im 22/36  bone]
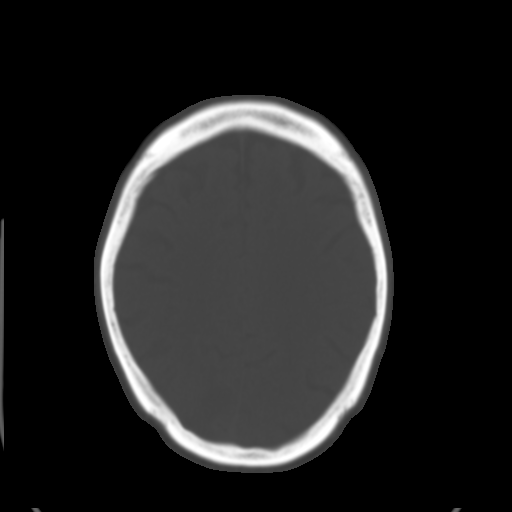
[im 27/36  brain]
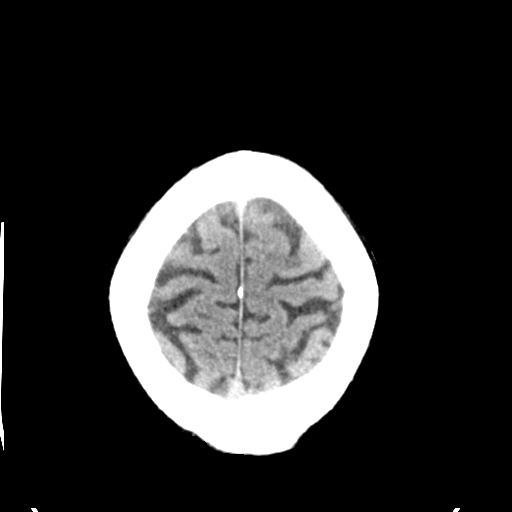
[im 31/36  brain]
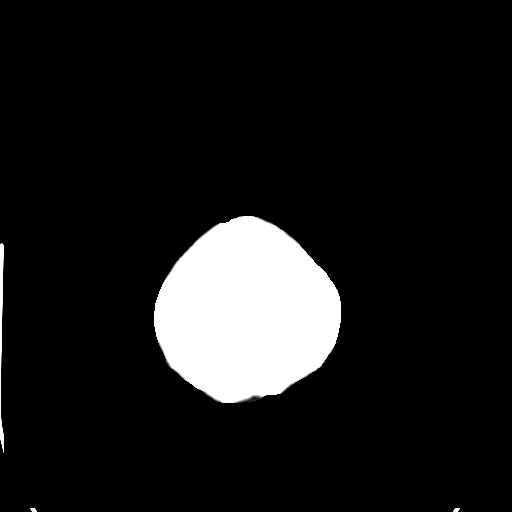

[Series 4: head bone · axial · 0.45mm/px · z∈[-139,-103]mm · 3 of 90 slices shown]
[im 9/90  bone]
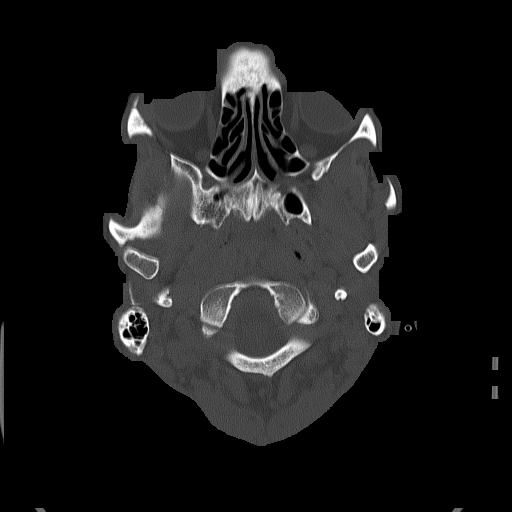
[im 18/90  bone]
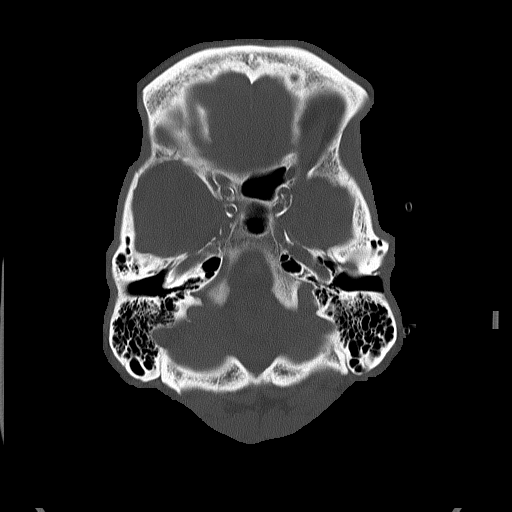
[im 27/90  bone]
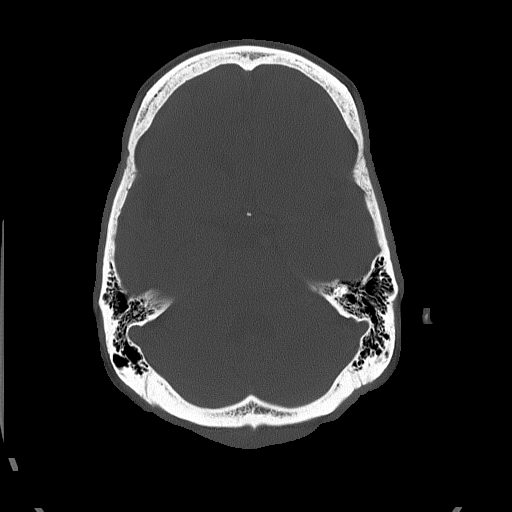

[Series 5: head without cor · coronal · non-contrast · 0.35mm/px · 3 of 70 slices shown]
[im 24/70  brain]
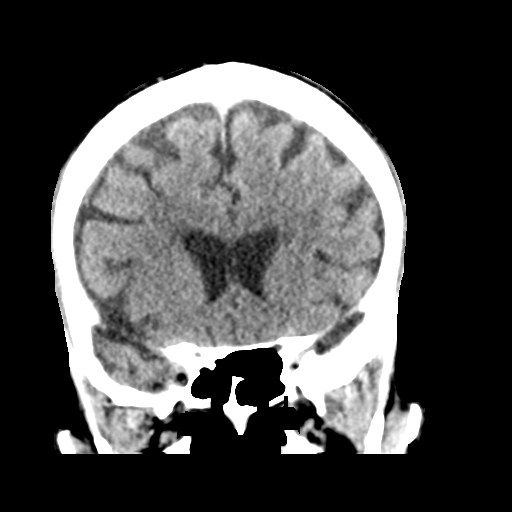
[im 31/70  brain]
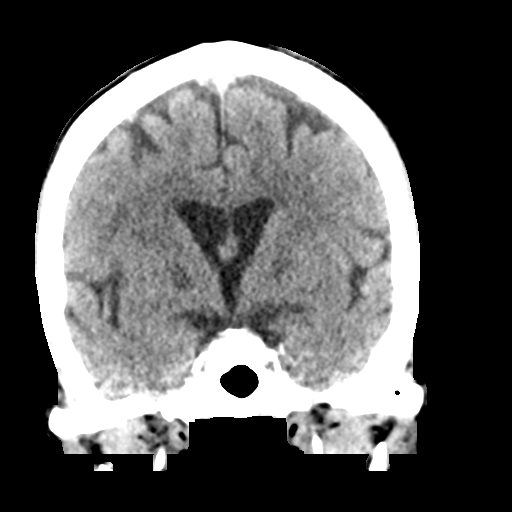
[im 39/70  brain]
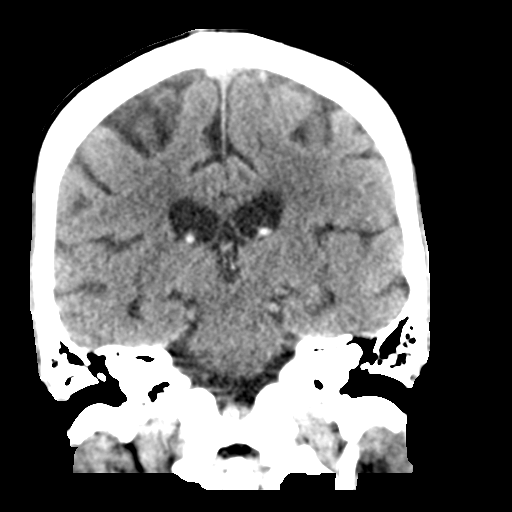

[Series 6: head without sag · sagittal · non-contrast · 0.33mm/px · 3 of 66 slices shown]
[im 22/66  brain]
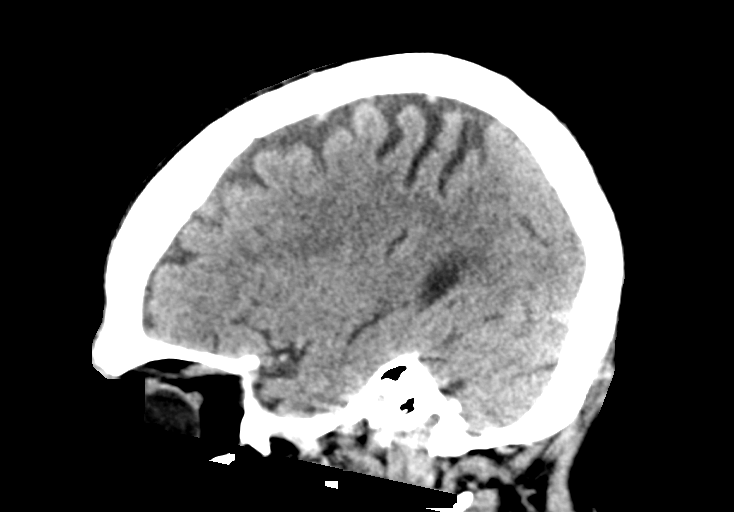
[im 33/66  brain]
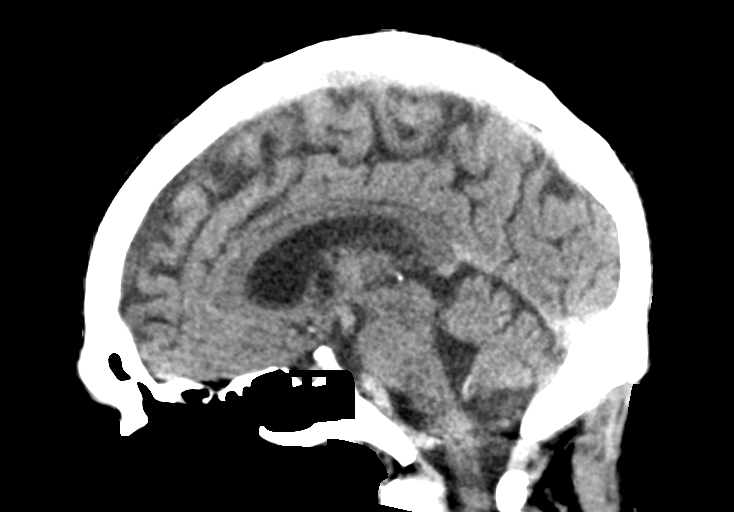
[im 44/66  brain]
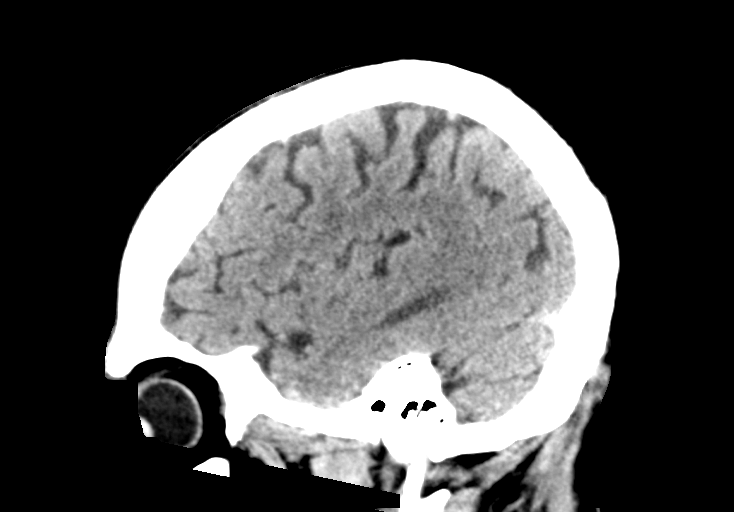

[16 of 47 positions shown; findings below may reference images not displayed]

FINDINGS: Brain: There is a rounded focus of hypoattenuation in the posterior
right parietal lobe measuring 2.1 by 1.7 by 1.3 cm. Scattered
hypoattenuation in the subcortical and periventricular deep white
matter is noted. Remote lacunar infarctions in the basal ganglia are
seen. No hemorrhage, hydrocephalus or midline shift.

Vascular: Atherosclerosis noted.

Skull: Intact.  No focal lesion.

Sinuses/Orbits: Negative.

Other: None.
IMPRESSION: Rounded focus of hypoattenuation in the posterior right parietal
lobe may be due to an infarct but cannot be definitively
characterized. Brain MRI with and without contrast is recommended
for further evaluation.

Chronic microvascular ischemic change.

## 2020-11-16 IMAGING — DX DG CHEST 1V PORT
1 series · 1 of 1 positions shown · non-contrast
Comparison: [DATE]

CLINICAL DATA: Altered mental status

EXAM:
PORTABLE CHEST 1 VIEW

[chest ap]
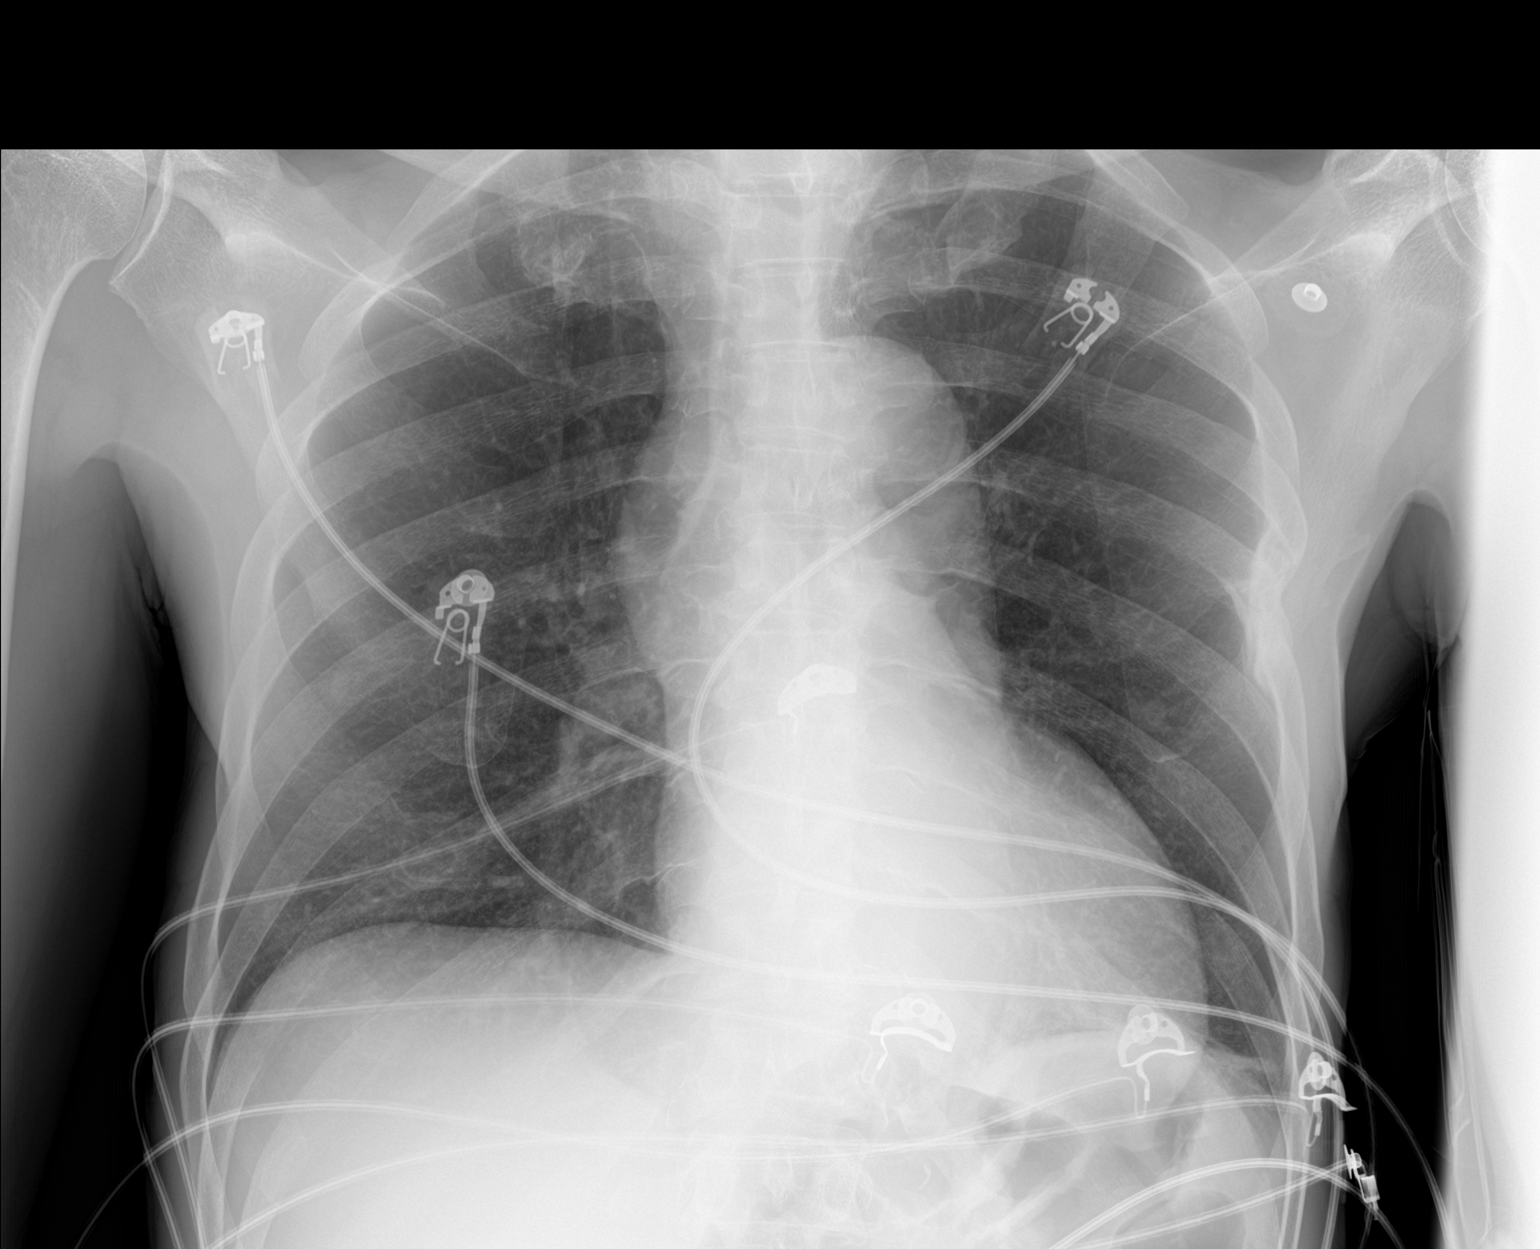

[1 of 1 positions shown; findings below may reference images not displayed]

FINDINGS: Cardiac shadow is within normal limits. Tortuous thoracic aorta is
noted accentuated by patient rotation. The lungs are clear
bilaterally. Old rib fractures on the left are seen and stable. No
new focal abnormality is noted.
IMPRESSION: No acute abnormality seen.

## 2020-11-16 IMAGING — MR MR HEAD W/O CM
10 of 11 series · 42 of 48 positions shown · non-contrast
Comparison: Prior head CT from earlier the same day.

CLINICAL DATA: Initial evaluation for acute mental status change,
unknown cause.

EXAM:
MRI HEAD WITHOUT CONTRAST
TECHNIQUE: Multiplanar, multiecho pulse sequences of the brain and surrounding
structures were obtained without intravenous contrast.

[Series 5: DWI · axial · 3.0mm · 0.88mm/px · z∈[-108,+38]mm · 9 of 102 slices shown (1 of 4)]
[im 1/102]
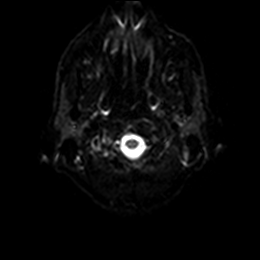
[im 12/102]
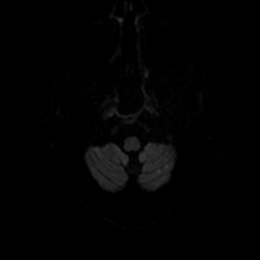
[im 23/102]
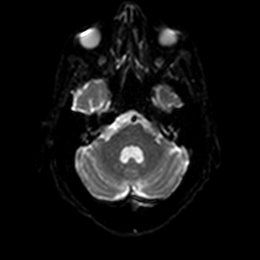
[im 34/102]
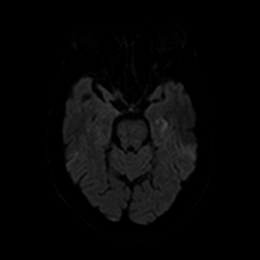
[im 45/102]
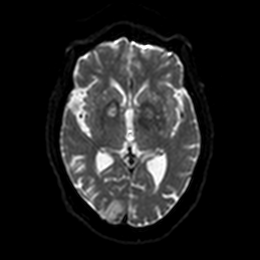
[im 57/102]
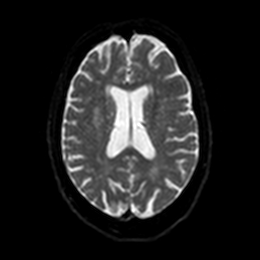
[im 68/102]
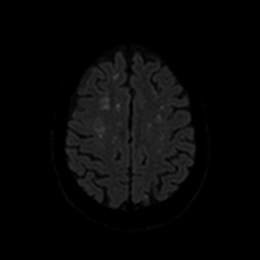
[im 90/102]
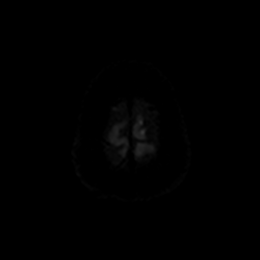
[im 102/102]
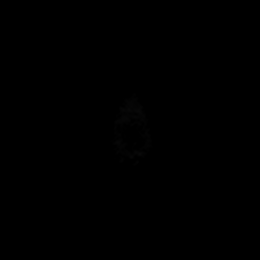

[Series 6: DWI · axial · 3.0mm · 0.88mm/px · z∈[-108,+38]mm · 5 of 51 slices shown (2 of 4)]
[im 1/51]
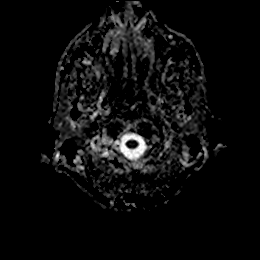
[im 13/51]
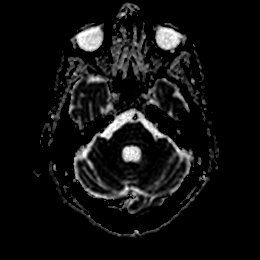
[im 26/51]
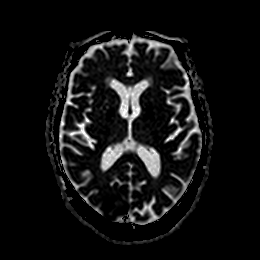
[im 38/51]
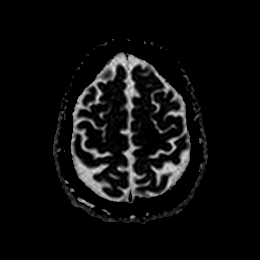
[im 51/51]
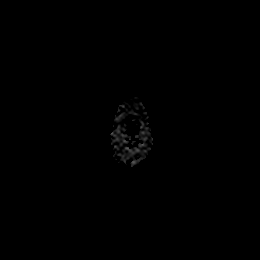

[Series 7: DWI · coronal · 4.0mm · 0.88mm/px · 6 of 66 slices shown (3 of 4)]
[im 1/66]
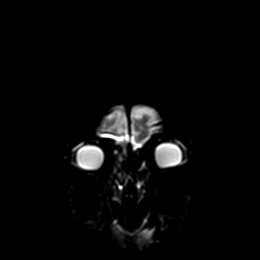
[im 14/66]
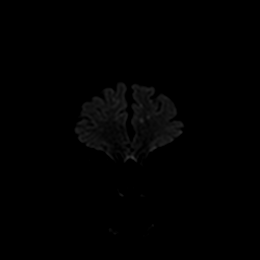
[im 27/66]
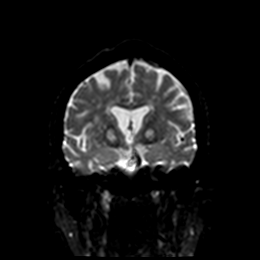
[im 40/66]
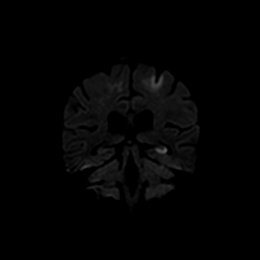
[im 53/66]
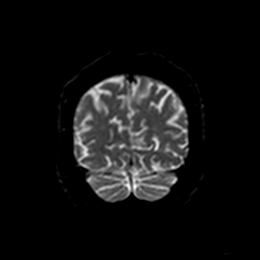
[im 66/66]
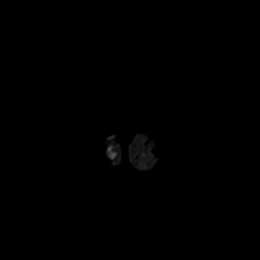

[Series 8: DWI · coronal · 4.0mm · 0.88mm/px · 3 of 33 slices shown (4 of 4)]
[im 1/33]
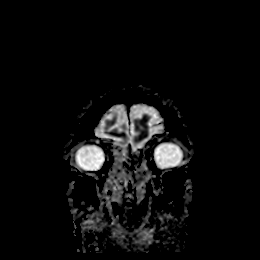
[im 17/33]
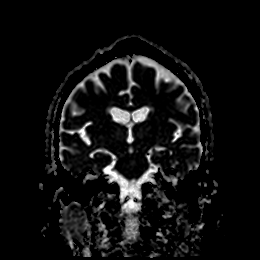
[im 33/33]
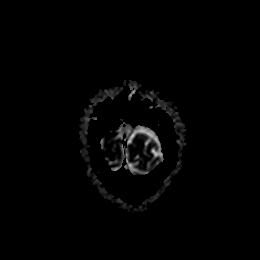

[Series 9: T1 · sagittal · 5.0mm · 0.75mm/px · 2 of 23 slices shown]
[im 1/23]
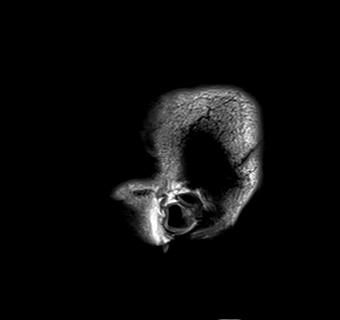
[im 23/23]
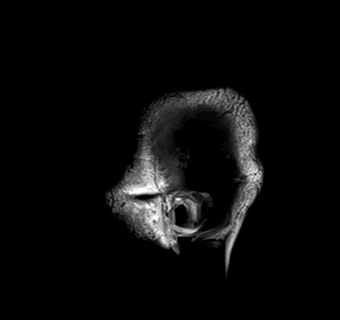

[Series 10: T2 · axial · 5.0mm · 0.72mm/px · z∈[-105,+35]mm · 2 of 25 slices shown (1 of 2)]
[im 1/25]
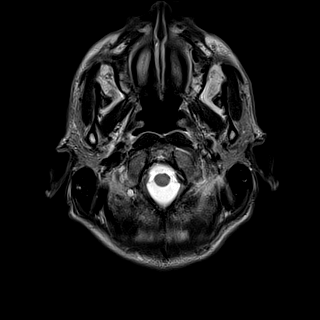
[im 25/25]
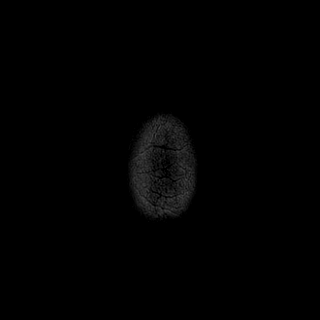

[Series 11: FLAIR · axial · 5.0mm · 0.45mm/px · z∈[-108,+33]mm · 2 of 25 slices shown]
[im 1/25]
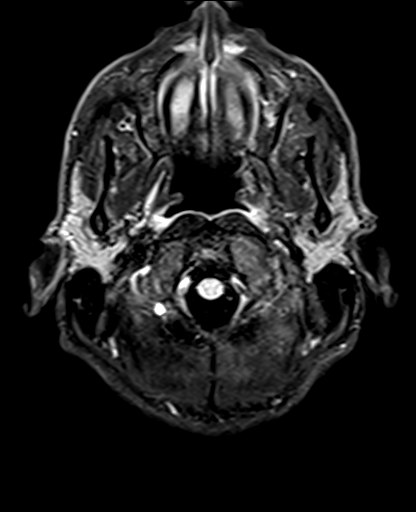
[im 25/25]
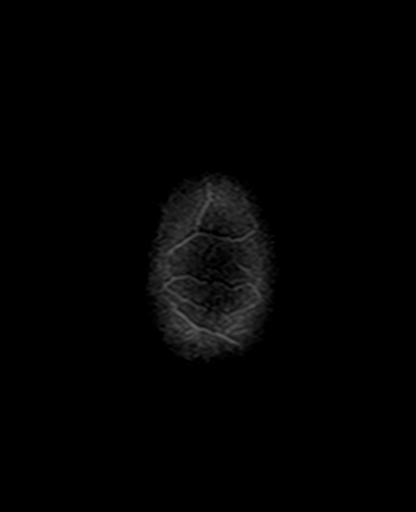

[Series 13: pha_images · axial · 3.0mm · 0.90mm/px · z∈[-124,+49]mm · 5 of 58 slices shown]
[im 1/58]
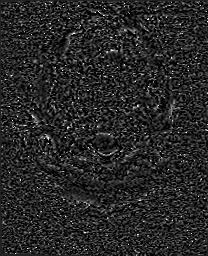
[im 15/58]
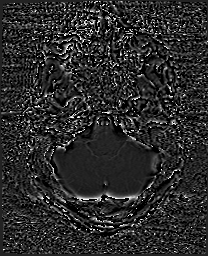
[im 29/58]
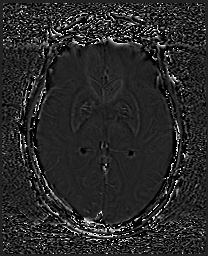
[im 43/58]
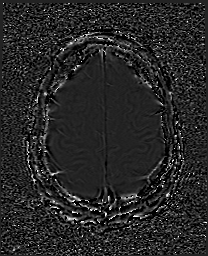
[im 58/58]
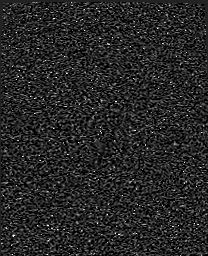

[Series 14: swi_images · axial · 3.0mm · 0.90mm/px · z∈[-124,+49]mm · 5 of 60 slices shown]
[im 1/60]
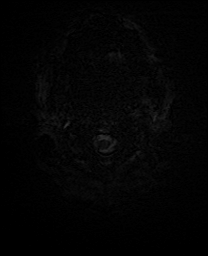
[im 15/60]
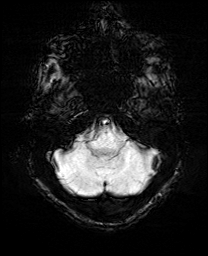
[im 30/60]
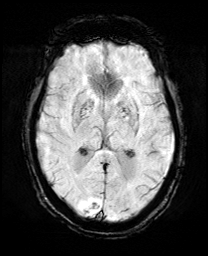
[im 45/60]
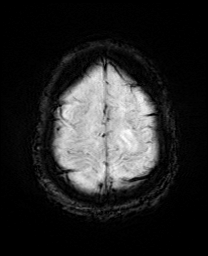
[im 60/60]
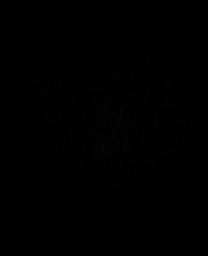

[Series 17: T2 · coronal · 5.0mm · 0.34mm/px · 3 of 29 slices shown (2 of 2)]
[im 1/29]
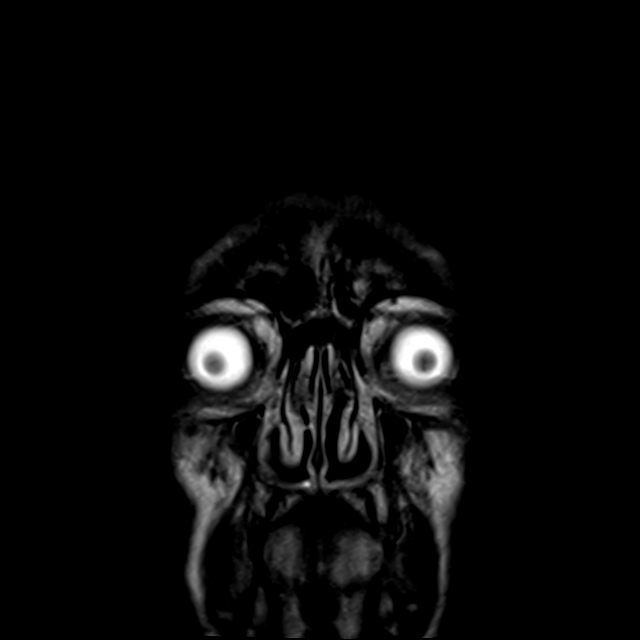
[im 15/29]
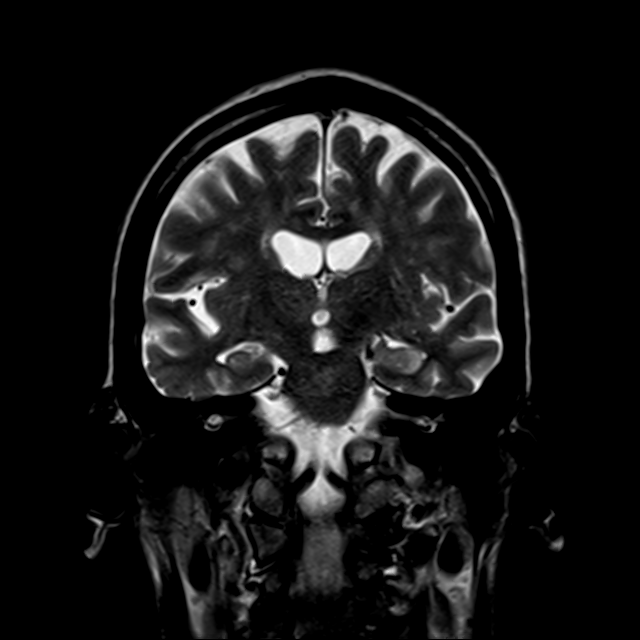
[im 29/29]
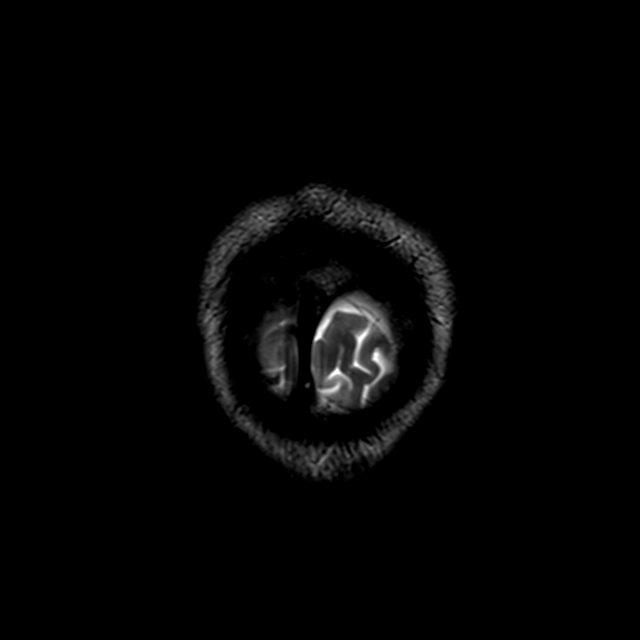

[42 of 48 positions shown; findings below may reference images not displayed]

FINDINGS: Brain: Diffuse prominence of the CSF containing spaces compatible
with generalized cerebral atrophy. Patchy T2/FLAIR hyperintensity
within the periventricular and deep white matter both cerebral
hemispheres as well as the pons, consistent with chronic
microvascular ischemic disease, moderate in nature.

2.6 area of restricted diffusion involving the cortical and
subcortical aspect of the right occipital pole, consistent with
acute to early subacute ischemic infarct (series 5, image 76).
Finding corresponds with abnormality on prior CT. Associated mild
petechial hemorrhage without frank hemorrhagic transformation or
significant mass effect. Multiple additional scattered foci of
restricted diffusion involving the cortical and subcortical aspect
of the bilateral cerebral hemispheres are seen, also consistent with
acute ischemic changes (series 5, images 91, 86, 79). Patchy
involvement of the caudate and lentiform nuclei noted bilaterally.
Few additional scattered punctate ischemic infarcts noted involving
the bilateral cerebellar hemispheres. No associated hemorrhage or
significant mass effect.

Additionally, there is abnormal symmetric restricted diffusion and
FLAIR signal abnormality involving the globus pallidi bilaterally
(series 5, image 75). Appearance is most typical of a superimposed
hypoxic ischemic injury versus toxic metabolic derangement.

Additionally, there is abnormal restricted diffusion involving the
mesial left temporal lobe with involvement of the left hippocampus
(series 5, image 70). While this finding may be ischemic in nature,
superimposed changes of seizure could also be considered.

No mass lesion, significant mass effect, or midline shift. No
hydrocephalus or extra-axial fluid collection. Pituitary gland and
suprasellar region normal. Midline structures intact.

Vascular: Major intracranial vascular flow voids are maintained.

Skull and upper cervical spine: Craniocervical junction within
normal limits. Signal intensity within the visualized bone marrow of
the upper cervical spine is diffusely heterogeneous and decreased on
T1 weighted imaging, nonspecific, but most commonly related to
anemia, smoking, or obesity. No focal marrow replacing lesion. No
scalp soft tissue abnormality.

Sinuses/Orbits: Globes and orbital soft tissues demonstrate no acute
finding. Scattered mucosal thickening noted throughout the paranasal
sinuses. No significant mastoid effusion. Inner ear structures
grossly normal.

Other: None.
IMPRESSION: 1. 2.6 area of acute to early subacute ischemic infarct involving
the right occipital pole, corresponding with abnormality on prior
CT. Associated mild petechial hemorrhage without frank hemorrhagic
transformation or significant mass effect.
2. Multiple additional scattered acute to early subacute ischemic
infarcts involving the cortical and subcortical aspect of both
cerebral and cerebellar hemispheres. Overall, a central
thromboembolic etiology is suspected given the various vascular
distributions involved.
3. Additional abnormal symmetric restricted diffusion involving the
globus pallidi bilaterally. Appearance is most characteristic of a
superimposed/concomitant hypoxic ischemic injury versus toxic
metabolic derangement.
4. Restricted diffusion involving the mesial left temporal lobe with
involvement of the left hippocampus. While this finding may be
ischemic in nature as well, superimposed changes of seizure and/or
limbic encephalitis could also be considered. Correlation with EEG
recommended.
5. Underlying age-related cerebral atrophy with moderate chronic
microvascular ischemic disease.

## 2020-11-16 MED ORDER — ACETAMINOPHEN 325 MG PO TABS: 650.0000 mg | ORAL_TABLET | Freq: Four times a day (QID) | ORAL | Status: DC | PRN

## 2020-11-16 MED ORDER — THIAMINE HCL 100 MG/ML IJ SOLN
100.0000 mg | Freq: Once | INTRAMUSCULAR | Status: AC
  Administered 2020-11-16: 100 mg via INTRAVENOUS
  Filled 2020-11-16: qty 2

## 2020-11-16 MED ORDER — ACETAMINOPHEN 650 MG RE SUPP: 650.0000 mg | Freq: Four times a day (QID) | RECTAL | Status: DC | PRN

## 2020-11-16 MED ORDER — SODIUM BICARBONATE 8.4 % IV SOLN
50.0000 meq | Freq: Once | INTRAVENOUS | Status: AC
  Administered 2020-11-16: 50 meq via INTRAVENOUS
  Filled 2020-11-16: qty 50

## 2020-11-16 MED ORDER — VANCOMYCIN HCL 1000 MG/200ML IV SOLN: 1000.0000 mg | Freq: Once | INTRAVENOUS | Status: DC

## 2020-11-16 MED ORDER — METRONIDAZOLE IN NACL 5-0.79 MG/ML-% IV SOLN
500.0000 mg | Freq: Once | INTRAVENOUS | Status: AC
  Administered 2020-11-16: 500 mg via INTRAVENOUS
  Filled 2020-11-16: qty 100

## 2020-11-16 MED ORDER — VANCOMYCIN VARIABLE DOSE PER UNSTABLE RENAL FUNCTION (PHARMACIST DOSING): Status: DC

## 2020-11-16 MED ORDER — SODIUM CHLORIDE 0.9% FLUSH
3.0000 mL | Freq: Two times a day (BID) | INTRAVENOUS | Status: DC
  Administered 2020-11-16 – 2020-11-28 (×22): 3 mL via INTRAVENOUS

## 2020-11-16 MED ORDER — LACTATED RINGERS IV SOLN: INTRAVENOUS | Status: AC

## 2020-11-16 MED ORDER — SODIUM CHLORIDE 0.9 % IV BOLUS
1000.0000 mL | Freq: Once | INTRAVENOUS | Status: AC
  Administered 2020-11-16: 1000 mL via INTRAVENOUS

## 2020-11-16 MED ORDER — VANCOMYCIN HCL 1250 MG/250ML IV SOLN
1250.0000 mg | Freq: Once | INTRAVENOUS | Status: AC
  Administered 2020-11-16: 1250 mg via INTRAVENOUS
  Filled 2020-11-16: qty 250

## 2020-11-16 MED ORDER — SODIUM CHLORIDE 0.9 % IV SOLN
2.0000 g | Freq: Once | INTRAVENOUS | Status: AC
  Administered 2020-11-16: 2 g via INTRAVENOUS
  Filled 2020-11-16: qty 2

## 2020-11-16 MED ORDER — INSULIN ASPART 100 UNIT/ML IV SOLN
5.0000 [IU] | Freq: Once | INTRAVENOUS | Status: AC
  Administered 2020-11-16: 5 [IU] via INTRAVENOUS

## 2020-11-16 MED ORDER — DEXTROSE 50 % IV SOLN
1.0000 | Freq: Once | INTRAVENOUS | Status: AC
  Administered 2020-11-16: 50 mL via INTRAVENOUS
  Filled 2020-11-16: qty 50

## 2020-11-16 MED ORDER — SODIUM CHLORIDE 0.9 % IV SOLN: 2.0000 g | INTRAVENOUS | Status: DC

## 2020-11-16 MED ORDER — LACTATED RINGERS IV BOLUS
1000.0000 mL | Freq: Once | INTRAVENOUS | Status: AC
  Administered 2020-11-16: 1000 mL via INTRAVENOUS

## 2020-11-16 MED ORDER — CALCIUM GLUCONATE 10 % IV SOLN
1.0000 g | Freq: Once | INTRAVENOUS | Status: AC
  Administered 2020-11-16: 1 g via INTRAVENOUS
  Filled 2020-11-16: qty 10

## 2020-11-16 MED ORDER — LABETALOL HCL 5 MG/ML IV SOLN: 5.0000 mg | INTRAVENOUS | Status: DC | PRN

## 2020-11-16 NOTE — Progress Notes (Signed)
Pharmacy Antibiotic Note  [name] is a 65 y.o. male admitted on 11/16/2020 with sepsis.  Pharmacy has been consulted for vancomycin and cefepime dosing.  Patient presents to the ED s/p found down and unresponsive, w/ c/o AMS. Patient noted to have acute AKI with Scr 5.21 on admit (BL ~1). Currently afebrile with WBC elevated at 18.7 and LAC elevated at 3.9.   Plan: Cefepime 2g IV q24h  Vancomycin 1250 mg IV x1 - Monitor renal function to determine further vancomycin doses  - F/u clinical improvement, LOT, and de-escalation  F/u culture data     Temp (24hrs), Avg:98.4 F (36.9 C), Min:98.4 F (36.9 C), Max:98.4 F (36.9 C)  Recent Labs  Lab 11/16/20 1125 11/16/20 1135  WBC  --  18.7*  CREATININE  --  5.21*  LATICACIDVEN 3.9*  --     CrCl cannot be calculated (Unknown ideal weight.).    No Known Allergies  Antimicrobials this admission: Cefepime 4/12 >>  Vancomycin 4/12 >>   Microbiology results: 4/12 Bcx: sent   Thank you for allowing pharmacy to be a part of this patient's care.  Claudina Lick, PharmD PGY1 Acute Care Pharmacy Resident 11/16/2020 1:37 PM  Please check [email or web address] for unit-specific pharmacy phone numbers.

## 2020-11-16 NOTE — ED Notes (Signed)
Contacted MRI about pt status in waiting list. Reports pt will need to be cleared d/t AMS which will take some time
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
## 2020-11-16 NOTE — ED Notes (Signed)
Critical trop of 147 - reported to Md.
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
## 2020-11-16 NOTE — ED Notes (Signed)
Pt transported to MRI 

## 2020-11-16 NOTE — ED Notes (Signed)
Patient transported to CT 

## 2020-11-16 NOTE — ED Triage Notes (Signed)
Pt arrives to ED via [facility] with c/o altered mental status. Per EMS pt was found by friend laying unresponsive on the floor. Last known normal was Saturday. Pt currently disoriented x4. Hx of rectal cancer.

## 2020-11-17 ENCOUNTER — Inpatient Hospital Stay (HOSPITAL_COMMUNITY): Payer: Medicare Other

## 2020-11-17 DIAGNOSIS — L8942 Pressure ulcer of contiguous site of back, buttock and hip, stage 2: Secondary | ICD-10-CM

## 2020-11-17 DIAGNOSIS — G9340 Encephalopathy, unspecified: Secondary | ICD-10-CM | POA: Diagnosis not present

## 2020-11-17 DIAGNOSIS — I639 Cerebral infarction, unspecified: Secondary | ICD-10-CM | POA: Diagnosis not present

## 2020-11-17 DIAGNOSIS — L8990 Pressure ulcer of unspecified site, unspecified stage: Secondary | ICD-10-CM | POA: Insufficient documentation

## 2020-11-17 DIAGNOSIS — L139 Bullous disorder, unspecified: Secondary | ICD-10-CM

## 2020-11-17 DIAGNOSIS — I5041 Acute combined systolic (congestive) and diastolic (congestive) heart failure: Secondary | ICD-10-CM

## 2020-11-17 DIAGNOSIS — T796XXA Traumatic ischemia of muscle, initial encounter: Secondary | ICD-10-CM

## 2020-11-17 DIAGNOSIS — F1010 Alcohol abuse, uncomplicated: Secondary | ICD-10-CM

## 2020-11-17 DIAGNOSIS — N179 Acute kidney failure, unspecified: Secondary | ICD-10-CM | POA: Diagnosis not present

## 2020-11-17 DIAGNOSIS — C20 Malignant neoplasm of rectum: Secondary | ICD-10-CM

## 2020-11-17 DIAGNOSIS — B182 Chronic viral hepatitis C: Secondary | ICD-10-CM

## 2020-11-17 DIAGNOSIS — B20 Human immunodeficiency virus [HIV] disease: Secondary | ICD-10-CM

## 2020-11-17 LAB — CBC WITH DIFFERENTIAL/PLATELET
Abs Immature Granulocytes: 0.07 10*3/uL (ref 0.00–0.07)
Basophils Absolute: 0 10*3/uL (ref 0.0–0.1)
Basophils Relative: 0 %
Eosinophils Absolute: 0 10*3/uL (ref 0.0–0.5)
Eosinophils Relative: 0 %
HCT: 45.7 % (ref 39.0–52.0)
Hemoglobin: 14.9 g/dL (ref 13.0–17.0)
Immature Granulocytes: 1 %
Lymphocytes Relative: 5 %
Lymphs Abs: 0.6 10*3/uL — ABNORMAL LOW (ref 0.7–4.0)
MCH: 30 pg (ref 26.0–34.0)
MCHC: 32.6 g/dL (ref 30.0–36.0)
MCV: 92 fL (ref 80.0–100.0)
Monocytes Absolute: 1.2 10*3/uL — ABNORMAL HIGH (ref 0.1–1.0)
Monocytes Relative: 9 %
Neutro Abs: 12.2 10*3/uL — ABNORMAL HIGH (ref 1.7–7.7)
Neutrophils Relative %: 85 %
Platelets: 244 10*3/uL (ref 150–400)
RBC: 4.97 MIL/uL (ref 4.22–5.81)
RDW: 15 % (ref 11.5–15.5)
WBC: 14.1 10*3/uL — ABNORMAL HIGH (ref 4.0–10.5)
nRBC: 0 % (ref 0.0–0.2)

## 2020-11-17 LAB — CSF CELL COUNT WITH DIFFERENTIAL
RBC Count, CSF: 1 /mm3 — ABNORMAL HIGH
Tube #: 1
WBC, CSF: 1 /mm3 (ref 0–5)

## 2020-11-17 LAB — COMPREHENSIVE METABOLIC PANEL
ALT: 94 U/L — ABNORMAL HIGH (ref 0–44)
AST: 213 U/L — ABNORMAL HIGH (ref 15–41)
Albumin: 2.4 g/dL — ABNORMAL LOW (ref 3.5–5.0)
Alkaline Phosphatase: 51 U/L (ref 38–126)
Anion gap: 14 (ref 5–15)
BUN: 76 mg/dL — ABNORMAL HIGH (ref 8–23)
CO2: 18 mmol/L — ABNORMAL LOW (ref 22–32)
Calcium: 8.5 mg/dL — ABNORMAL LOW (ref 8.9–10.3)
Chloride: 114 mmol/L — ABNORMAL HIGH (ref 98–111)
Creatinine, Ser: 2.45 mg/dL — ABNORMAL HIGH (ref 0.61–1.24)
GFR, Estimated: 28 mL/min — ABNORMAL LOW (ref >60)
Glucose, Bld: 82 mg/dL (ref 70–99)
Potassium: 4.2 mmol/L (ref 3.5–5.1)
Sodium: 146 mmol/L — ABNORMAL HIGH (ref 135–145)
Total Bilirubin: 1.1 mg/dL (ref 0.3–1.2)
Total Protein: 6.5 g/dL (ref 6.5–8.1)

## 2020-11-17 LAB — ECHOCARDIOGRAM LIMITED
Area-P 1/2: 3.27 cm2
Calc EF: 49.6 %
Height: 67 in
P 1/2 time: 569 msec
S' Lateral: 3.4 cm
Single Plane A2C EF: 51.4 %
Single Plane A4C EF: 49.1 %
Weight: 2144.63 oz

## 2020-11-17 LAB — PROTEIN AND GLUCOSE, CSF
Glucose, CSF: 66 mg/dL (ref 40–70)
Total  Protein, CSF: 50 mg/dL — ABNORMAL HIGH (ref 15–45)

## 2020-11-17 LAB — HIV-1 RNA QUANT-NO REFLEX-BLD
HIV 1 RNA Quant: 480 copies/mL
LOG10 HIV-1 RNA: 2.681 log10copy/mL

## 2020-11-17 LAB — CRYPTOCOCCAL ANTIGEN, CSF: Crypto Ag: NEGATIVE

## 2020-11-17 LAB — TROPONIN I (HIGH SENSITIVITY): Troponin I (High Sensitivity): 127 ng/L (ref <18)

## 2020-11-17 LAB — PROTIME-INR
INR: 1.1 (ref 0.8–1.2)
Prothrombin Time: 14.3 seconds (ref 11.4–15.2)

## 2020-11-17 LAB — LACTIC ACID, PLASMA: Lactic Acid, Venous: 3.2 mmol/L (ref 0.5–1.9)

## 2020-11-17 LAB — CK: Total CK: 5920 U/L — ABNORMAL HIGH (ref 49–397)

## 2020-11-17 LAB — MAGNESIUM: Magnesium: 2.6 mg/dL — ABNORMAL HIGH (ref 1.7–2.4)

## 2020-11-17 MED ORDER — DEXTROSE 5 % IV SOLN
600.0000 mg | Freq: Two times a day (BID) | INTRAVENOUS | Status: DC
  Administered 2020-11-17: 600 mg via INTRAVENOUS
  Filled 2020-11-17 (×2): qty 12

## 2020-11-17 MED ORDER — SODIUM CHLORIDE 0.9 % IV SOLN
2.0000 g | Freq: Two times a day (BID) | INTRAVENOUS | Status: DC
  Administered 2020-11-17 (×2): 2 g via INTRAVENOUS
  Filled 2020-11-17 (×2): qty 20

## 2020-11-17 MED ORDER — VANCOMYCIN HCL 1250 MG/250ML IV SOLN
1250.0000 mg | Freq: Once | INTRAVENOUS | Status: DC
  Filled 2020-11-17: qty 250

## 2020-11-17 MED ORDER — VANCOMYCIN HCL 1000 MG/200ML IV SOLN: 1000.0000 mg | Freq: Three times a day (TID) | INTRAVENOUS | Status: DC

## 2020-11-17 MED ORDER — THIAMINE HCL 100 MG/ML IJ SOLN
500.0000 mg | Freq: Three times a day (TID) | INTRAVENOUS | Status: AC
  Administered 2020-11-17 – 2020-11-21 (×13): 500 mg via INTRAVENOUS
  Filled 2020-11-17 (×17): qty 5

## 2020-11-17 MED ORDER — VANCOMYCIN HCL 750 MG/150ML IV SOLN
750.0000 mg | INTRAVENOUS | Status: DC
  Filled 2020-11-17: qty 150

## 2020-11-17 MED ORDER — SODIUM CHLORIDE 0.9 % IV SOLN: 2.0000 g | INTRAVENOUS | Status: DC

## 2020-11-17 MED ORDER — DEXTROSE 5 % IV SOLN
600.0000 mg | INTRAVENOUS | Status: DC
  Filled 2020-11-17: qty 12

## 2020-11-17 MED ORDER — DEXTROSE 5 % IV SOLN
600.0000 mg | INTRAVENOUS | Status: DC
  Administered 2020-11-18: 600 mg via INTRAVENOUS
  Filled 2020-11-17: qty 12

## 2020-11-17 MED ORDER — SODIUM CHLORIDE 0.9 % IV SOLN
2.0000 g | Freq: Three times a day (TID) | INTRAVENOUS | Status: DC
  Administered 2020-11-17 – 2020-11-18 (×3): 2 g via INTRAVENOUS
  Filled 2020-11-17: qty 2000
  Filled 2020-11-17: qty 2
  Filled 2020-11-17 (×3): qty 2000

## 2020-11-17 MED ORDER — ASPIRIN 300 MG RE SUPP
300.0000 mg | Freq: Every day | RECTAL | Status: DC
  Administered 2020-11-18: 300 mg via RECTAL
  Filled 2020-11-17: qty 1

## 2020-11-17 MED ORDER — CHLORHEXIDINE GLUCONATE CLOTH 2 % EX PADS
6.0000 | MEDICATED_PAD | Freq: Every day | CUTANEOUS | Status: DC
  Administered 2020-11-17 – 2020-12-09 (×23): 6 via TOPICAL

## 2020-11-17 MED ORDER — ASPIRIN 81 MG PO CHEW: 81.0000 mg | CHEWABLE_TABLET | Freq: Every day | ORAL | Status: DC

## 2020-11-17 MED ORDER — LACTATED RINGERS IV SOLN: INTRAVENOUS | Status: DC

## 2020-11-17 MED ORDER — SODIUM CHLORIDE 0.9 % IV SOLN
2.0000 g | Freq: Three times a day (TID) | INTRAVENOUS | Status: DC
  Administered 2020-11-17: 2 g via INTRAVENOUS
  Filled 2020-11-17: qty 2000
  Filled 2020-11-17: qty 2
  Filled 2020-11-17 (×2): qty 2000

## 2020-11-17 MED ORDER — LIDOCAINE HCL (CARDIAC) PF 100 MG/5ML IV SOSY
PREFILLED_SYRINGE | INTRAVENOUS | Status: AC
  Filled 2020-11-17: qty 5

## 2020-11-17 NOTE — Progress Notes (Signed)
Subjective: Overnight, patient received MRI Brain. Neurology was also consulted by night float and patient received stat EEG   This morning, patient initially was somnolent but woke to physical stimulation. Does not respond verbally. Eyes track appropriately. Follows some commands.   Objective:  Vital signs in last 24 hours: Vitals:   11/16/20 2200 11/16/20 2345 11/17/20 0200 11/17/20 0607  BP: (!) 154/100 (!) 148/84 (!) 144/90 (!) 157/88  Pulse: 95 91 79   Resp: 20 20 20 20   Temp: 98.3 F (36.8 C) 98.1 F (36.7 C) 98.1 F (36.7 C) 98 F (36.7 C)  TempSrc: Axillary Axillary Axillary Axillary  SpO2: 99% 93% 94%    Physical Exam: General: Lethargic; awakens to physical stimulation; no acute distress Eyes: PERRL, no scleral icterus Cardiovascular: Regular, rate rhythm; no murmurs, rubs, gallops; 2+ radial pulses bilaterally; 1+ pedal pulses bilaterally  Abdomen: Normal bowel sounds; soft, non-distended  Extremities: No lower extremity edema Skin: Tense bullae overlying extensor surfaces of hands, wrists, lower extremities bilaterally; overlying wound dressing over left knee  Neuro: Unresponsive; no facial asymmetry; no tremor, atrophy, or seizure activity; moves fingers, toes bilaterally on command. Rotates head when directed.   GCS: 9  Eye score: 2  Verbal score: 1 Motor score: 6   Intake/Output Summary (Last 24 hours) at 11/17/2020 0723 Last data filed at 11/17/2020 0600 Gross per 24 hour  Intake 5093.61 ml  Output 2600 ml  Net 2493.61 ml   Labs (since admission):   CK: 5,290 Lactic Acid: 3.2  CBC Latest Ref Rng & Units 11/17/2020 11/16/2020 10/22/2020  WBC 4.0 - 10.5 K/uL 14.1(H) 18.7(H) 3.7(L)  Hemoglobin 13.0 - 17.0 g/dL 14.9 15.8 14.1  Hematocrit 39.0 - 52.0 % 45.7 49.4 42.5  Platelets 150 - 400 K/uL 244 293 261  Neutrophil: 12.2   CMP Latest Ref Rng & Units 11/17/2020 11/16/2020 11/16/2020  Glucose 70 - 99 mg/dL 82 91 105(H)  BUN 8 - 23 mg/dL 76(H) 86(H) 95(H)   Creatinine 0.61 - 1.24 mg/dL 2.45(H) 3.32(H) 5.21(H)  Sodium 135 - 145 mmol/L 146(H) 143 141  Potassium 3.5 - 5.1 mmol/L 4.2 4.6 5.9(H)  Chloride 98 - 111 mmol/L 114(H) 109 105  CO2 22 - 32 mmol/L 18(L) 17(L) 19(L)  Total Protein 6.5 - 8.1 g/dL 6.5 - 8.6(H)  Total Bilirubin 0.3 - 1.2 mg/dL 1.1 - 1.1  Alkaline Phos 38 - 126 U/L 51 - 75  AST 15 - 41 U/L 213(H) - 254(H)  ALT 0 - 44 U/L 94(H) - 113(H)  Mg: 2.6  PT/INR: 14.3, 1.1   Troponin I: 127<149   Cryptococcal antigen - negative  Blood culture - no growth < 24 hours   MR BRAIN WO CONTRAST 1. 2.6 area of acute to early subacute ischemic infarct involving the right occipital pole, corresponding with abnormality on prior CT. Associated mild petechial hemorrhage without frank hemorrhagic transformation or significant mass effect.  2. Multiple additional scattered acute to early subacute ischemic infarcts involving the cortical and subcortical aspect of both cerebral and cerebellar hemispheres. Overall, a central thromboembolic etiology is suspected given the various vascular distributions involved.  3. Additional abnormal symmetric restricted diffusion involving the globus pallidi bilaterally. Appearance is most characteristic of a superimposed/concomitant hypoxic ischemic injury versus toxic metabolic derangement.  4. Restricted diffusion involving the mesial left temporal lobe with involvement of the left hippocampus. While this finding may be ischemic in nature as well, superimposed changes of seizure and/or limbic encephalitis could also be considered. Correlation with  EEG recommended.  5. Underlying age-related cerebral atrophy with moderate chronic microvascular ischemic disease.   ECHOCARDIOGRAM LIMITED 1. Left ventricular ejection fraction, by estimation, is 45 to 50%. The left ventricle has mildly decreased function. The left ventricle demonstrates regional wall motion abnormalities (see scoring diagram/findings for description).  There is hypokinesis  of the anterior and anteroseptal LV wall segments. There is moderate concentric left ventricular hypertrophy. Left ventricular diastolic parameters are consistent with Grade I diastolic dysfunction (impaired relaxation).  2. Right ventricular systolic function is normal. The right ventricular size is normal. There is normal pulmonary artery systolic pressure.  3. Moderate pleural effusion in the left lateral region.  4. The mitral valve is normal in structure. Trivial mitral valve regurgitation.  5. The aortic valve is tricuspid. There is mild calcification of the aortic valve. There is mild thickening of the aortic valve. Aortic valve regurgitation is mild. Mild aortic valve sclerosis is present, with no evidence of aortic valve stenosis.  6. The inferior vena cava is normal in size with greater than 50% respiratory variability, suggesting right atrial pressure of 3 mmHg.  7. There is mildly reduced LVEF with anterior and anteroseptal wall motion abnormalities concerning for possible LAD lesion.  EEG: - Obtained while obtunded and is abnormal due to moderate diffuse slowing suggestive of general cerebral dysfunction   Assessment/Plan:  Active Problems:   Acute encephalopathy   Pressure injury of skin  In summary, [name] is a 65 y.o. man with a history of AIDS and substance use admitted with acute encephalopathy. Found to have new acute/subacute ischemic infarcts on MRI Brain and laboratory work-up notable for severe AKI, multiple metabolic abnormalities. MRI Brain demonstrates new acute/subacute ischemic infarcts. Work-up notable for severe AKI and multiple metabolic abnormalities on admission.    #Acute Encephalopathy  Acute Ischemic CVA  Patient's altered mental status has improved slightly since admission. While he remains verbally unresponsive, patient is now opens his eyes to physical stimulation, track objects, and follow motor commands. Patient's acute encephalopathy  is likely multifactorial secondary to structural abnormalities (new ischemic infarcts noted on MRI brain), metabolic abnormalities (uremia, ammonemia), possible infection, and possible toxin exposure/withdrawal. EEG study ordered to assess for possible seizure activity given abnormalities noted at mesial temporal lobe on MRI brain -- results suggestive of general cerebral dysfunction. 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 He would benefit from LP -- notably, his cryptococcal antigen was negative. Metabolic abnormalities, including elevated BUN, have improved with IVF. Patient's IV drug use history is unknown. Injection drug use can cause endocarditis with septic emboli going to the brain, which could contribute to MRI findings. However, no septic vegetations noted on ECHO. Opiate overdose can also contribute to possible anoxic brain injury by inducing respiratory depression. Furthermore, his possibly prolonged hypovolemic status on admission puts him at risk for anoxic brain injury as well. Alcohol withdrawal symptoms need to be monitored (tachycardia, tremors) given his history of chronic alcohol use.  - Neurology consulted, appreciate recommendations  - ID consulted, appreciate recommendations  - Start acyclovir 600 mg IV Q12H for possible HSV - Start ampicillin 2 g IV  - Continue ceftriaxone 2 g IV daily - Thiamine 500 mg injections TID for 5 days (1/5) - f/u LP - f/u RPR  - f/u Thiamine levels   #AKI, improving  Hyperkalemia, resolved Both AKI and hyperkalemia have improved with continuous IVF. Cr 2.45,  GFR 28, improved from Cr 5.21, GFR 11 on admission. Baseline Cr ~1.00, GFR>60. Potassium 4.2 this morning, from 5.9 on admission. Etiology of AKI, hyperkalemia likely multifactorial secondary to pre-renal and intrinsic etiologies (rhabdomyolysis).  - Continue IVF LR 200 cc/hr  - Daily CMP  to monitor renal function, electrolytes    #Rhabdomyolysis  Improving with IVF. Down-trending CK, AST/ALT noted today. Urine output is approximately 103 cc/hr which is on target (~61-180 cc/hr).  - Continue IVF LR 200 cc/hr; titrate if needed to maintain 1-3 ml/kg/hr output   #AIDS CD4 count of 197, down from 238 on 08/09/20. Patient has a history of not taking home Symtuza according to ID team. Unclear if patient has been able to take his medication recently. His low CD4 count puts him at risk for a number of opportunistic infections, including cryptococcal meningitis which can cause altered mentation.  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 EKG on admission notable for ST depressions at V4, V5 leads -- unchanged from prior EKD in January 2022. Troponins also elevated but down-trending. Patient likely had an NSTEMI secondary to demand ischemia given his history of chronic hypertension.  - Cardiology consulted, appreciate recommendations   #Chronic hypertension   Patient's BP has improved without medication since admission, now near his baseline. His cocaine use may also be contributing to his increased blood pressure on admission. Will hold off on decreasing blood pressure at this time given ischemic stroke to facilitate permissive hypertension.      LOS: 1 day   Gwynneth Albright, Medical Student 11/17/2020, 7:23 AM

## 2020-11-17 NOTE — Progress Notes (Signed)
PHARMACY NOTE:  ANTIMICROBIAL RENAL DOSAGE ADJUSTMENT  Current antimicrobial regimen includes a mismatch between antimicrobial dosage and estimated renal function.  As per policy approved by the Pharmacy & Therapeutics and Medical Executive Committees, the antimicrobial dosage will be adjusted accordingly.  Current antimicrobial dosage: Acyclovir 10 mg/kg every 12 hours  Indication: HSV encephalitis  Renal Function:  Estimated Creatinine Clearance: 25.5 mL/min (A) (by C-G formula based on SCr of 2.45 mg/dL (H)). []      On intermittent HD, scheduled: []      On CRRT    Antimicrobial dosage has been changed to:  Acyclovir 10 mg/kg every 24 hours   Additional comments:   Thank you for allowing pharmacy to be a part of this patient's care.  Jimmy Footman, PharmD, BCPS, [facility] Clinical Pharmacist Phone: [phone] 11/17/2020 2:49 PM

## 2020-11-17 NOTE — Progress Notes (Signed)
EEG maintenance complete. No skin breakdown at [facility] and Ref

## 2020-11-17 NOTE — Procedures (Signed)
LUMBAR PUNCTURE (SPINAL TAP) PROCEDURE NOTE  Indication: Acute encephalopathy, leukocytosis with unknown infectious source   Proceduralists: Dr. Leonel Ramsay, Anibal Henderson, NP   Risks of the procedure were dicussed with the patient including post-LP headache, bleeding, infection, weakness/numbness of legs(radiculopathy), death.    Consent obtained from: Patient unable to consent due to altered mental status, family unable to be reached. Emergency consent was obtained with 2 attending physicians Dr. Leonel Ramsay and Dr. [name]    Procedure Note The patient was prepped and draped, and using sterile technique a 20 gauge quinke spinal needle was inserted in the L4-5 space.   Opening pressure was 6 cm H2O.  Approximately 14 cc of CSF were obtained and sent for analysis.  Patient tolerated the procedure well and blood loss was minimal.    Anibal Henderson, AGAC-NP [facility] Pager: [phone]

## 2020-11-17 NOTE — Progress Notes (Signed)
  Echocardiogram 2D Echocardiogram has been performed.  Fidel Levy 11/17/2020, 9:40 AM

## 2020-11-17 NOTE — Procedures (Addendum)
Routine EEG Report  [name] is a 65 y.o. male with a history of acute encephalopathy who is undergoing an EEG to evaluate for seizures.  Report: This EEG was acquired with electrodes placed according to the International 10-20 electrode system (including Fp1, Fp2, F3, F4, C3, C4, P3, P4, O1, O2, T3, T4, T5, T6, A1, A2, Fz, Cz, Pz). The following electrodes were missing or displaced: none.  The best background activity was 5-6 Hz with intermittent delta slowing. This activity is reactive to stimulation. Sleep was characterized by vertex waves, sleep spindles (12 to 14 Hz). There was no focal slowing. There were no interictal epileptiform discharges. There were no electrographic seizures identified. Photic stimulation and hyperventilation were not performed.  Impression and clinical correlation: This EEG was obtained while obtunded and is abnormal due to moderate diffuse slowing suggestive of general cerebral dysfunction.  Su Monks, MD [facility] [phone]  If 7pm- 7am, please page neurology on call as listed in [place].

## 2020-11-17 NOTE — Consult Note (Addendum)
NEUROLOGY CONSULTATION NOTE   Date of service: November 17, 2020 Patient Name: [name] MRN:  [id number] DOB:  02-11-1956 Reason for consult: obtundation w/ w/o extensive acute ischemia on MRI brain _ _ _   _ __   _ __ _ _  __ __   _ __   __ _  History of Present Illness   [name] is a 65 y.o. 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 Pt obtunded and unable to provide hx. Per medical student H&P 11/16/20 "Patient was found unresponsive this morning on friend's back porch after not being seen for more than 24 hours. According to sister, patient was last seen clipping neighbor's hedges yesterday morning. This morning, he was found lying down next to a can of beer and some food, snacks. Sister notes that he had an episode of clear, nonbloody vomiting and was incontinent of stool after being found. ED provider notes that patient had blisters of his knees, hands suggestive of ulcers secondary to prolonged immobility upon arrival to hospital.   Of note, patient's primary interaction with the healthcare system is fragmented. He does not have a PCP. Patient is occassionally seen in the ED and follows with Dr. [name] in the ID clinic. Per ID, patient has not regularly taken his antiretroviral therapy and had an episode of cryptococcal meningitis several years ago. He was last seen in the ID clinic in March 2022 with improving viral load and CD4 count in the 200s. Notably, he was feeling "down" at the time as his close friend recently passed away.  UDS cocaine (+). WBC 18.7. CD4 count on admission 197. Serum crypto antigen neg. (-) COVID, (-) influenza A+B. UA neg for infection. Blood cx pending x2. CXR neg for infection.  Patient has remained obtunded since admission though with some fluctuations (at times unarousable, at other times will open eyes to voice). ID  consulted, recs pending. MRI showed extensive acute cerebrovascular ischemia with abnl restricted diffusion involving L mesial temporal lobe incl hippocampus ddx incl ischemic +/- superimposed changes of seizure.   MRI HEAD WITHOUT CONTRAST   TECHNIQUE: Multiplanar, multiecho pulse sequences of the brain and surrounding structures were obtained without intravenous contrast.   COMPARISON:  Prior head CT from earlier the same day.   FINDINGS: Brain: Diffuse prominence of the CSF containing spaces compatible with generalized cerebral atrophy. Patchy T2/FLAIR hyperintensity within the periventricular and deep white matter both cerebral hemispheres as well as the pons, consistent with chronic microvascular ischemic disease, moderate in nature.   2.6 area of restricted diffusion involving the cortical and subcortical aspect of the right occipital pole, consistent with acute to early subacute ischemic infarct (series 5, image 76). Finding corresponds with abnormality on prior CT. Associated mild petechial hemorrhage without frank hemorrhagic transformation or significant mass effect. Multiple additional scattered foci of restricted diffusion involving the cortical and subcortical aspect of the bilateral cerebral hemispheres are seen, also consistent with acute ischemic changes (series 5, images 91, 86, 79). Patchy involvement of the caudate and lentiform nuclei noted bilaterally. Few additional scattered punctate ischemic infarcts noted involving the bilateral cerebellar hemispheres. No associated hemorrhage or significant mass effect.   Additionally, there is abnormal symmetric restricted diffusion and FLAIR signal abnormality involving the globus pallidi bilaterally (series 5, image 75). Appearance is most typical of a superimposed hypoxic ischemic injury versus toxic  metabolic derangement.   Additionally, there is abnormal restricted diffusion involving the mesial left temporal lobe  with involvement of the left hippocampus (series 5, image 70). While this finding may be ischemic in nature, superimposed changes of seizure could also be considered.   No mass lesion, significant mass effect, or midline shift. No hydrocephalus or extra-axial fluid collection. Pituitary gland and suprasellar region normal. Midline structures intact.   Vascular: Major intracranial vascular flow voids are maintained.   Skull and upper cervical spine: Craniocervical junction within normal limits. Signal intensity within the visualized bone marrow of the upper cervical spine is diffusely heterogeneous and decreased on T1 weighted imaging, nonspecific, but most commonly related to anemia, smoking, or obesity. No focal marrow replacing lesion. No scalp soft tissue abnormality.   Sinuses/Orbits: Globes and orbital soft tissues demonstrate no acute finding. Scattered mucosal thickening noted throughout the paranasal sinuses. No significant mastoid effusion. Inner ear structures grossly normal.   Other: None.   IMPRESSION: 1. 2.6 area of acute to early subacute ischemic infarct involving the right occipital pole, corresponding with abnormality on prior CT. Associated mild petechial hemorrhage without frank hemorrhagic transformation or significant mass effect. 2. Multiple additional scattered acute to early subacute ischemic infarcts involving the cortical and subcortical aspect of both cerebral and cerebellar hemispheres. Overall, a central thromboembolic etiology is suspected given the various vascular distributions involved. 3. Additional abnormal symmetric restricted diffusion involving the globus pallidi bilaterally. Appearance is most characteristic of a superimposed/concomitant hypoxic ischemic injury versus toxic metabolic derangement. 4. Restricted diffusion involving the mesial left temporal lobe with involvement of the left hippocampus. While this finding may be ischemic in  nature as well, superimposed changes of seizure and/or limbic encephalitis could also be considered. Correlation with EEG recommended. 5. Underlying age-related cerebral atrophy with moderate chronic microvascular ischemic disease.  CNS imaging personally reviewed; I agree with above interpretation.  STAT EEG personally reviewed, DS w/o epileptiform abnl, no seizures    ROS   UTA 2/2 encephalopathy  Past History   Past Medical History:  Diagnosis Date  . Acute kidney failure    unspec, secondary to medications  . AIDS ([place]) 06/07/2016  . Alcohol abuse 05/03/2016  . anal ca dx'd 02/2011  . Cachexia ([place])   . 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 Drug abuse ([place])   . GERD (gastroesophageal reflux disease) 08/09/2016  . Grieving 11/29/2017  . Hemorrhoid   . Hepatitis C   . History of radiation therapy 01/01/2011 -05/18/11   anal, pelvis, inguinal lymph nodes  . HIV (human immunodeficiency virus infection) ([place])   . HIV or AIDS   . Housing problems 10/22/2020  . Hypertension   . Loose stools 06/07/2016  . Mass of anus 01/26/2011   squamous cell cancer  . Meningitis   . Mood disorder ([place])   . Pain    right leg  . Pulmonary nodule   . Rash and nonspecific skin eruption 03/31/2015  . Rectal bleeding 08/09/2020  . Testicular hypofunction    Past Surgical History:  Procedure Laterality Date  . MASS EXCISION  01/26/2011   squamous cell carcinoma   Family History  Family history unknown: Yes   Social History   Socioeconomic History  . Marital status: Single    Spouse name: Not on file  . Number of children: Not on file  . Years of education: Not on file  . Highest education level: Not on file  Occupational History  . Not on file  Tobacco Use  .  Smoking status: Current Every Day Smoker  . Smokeless tobacco: Never Used  . Tobacco comment: 2-3 day  Vaping Use  . Vaping Use: Never used  Substance and Sexual Activity  . Alcohol use: Yes    Alcohol/week: 6.0 standard drinks     Types: 6 Cans of beer per week  . Drug use: Not Currently  . Sexual activity: Not Currently    Partners: Male    Birth control/protection: Condom    Comment: given condoms  Other Topics Concern  . Not on file  Social History Narrative   ** Merged History Encounter **       Social Determinants of Health   Financial Resource Strain: Not on file  Food Insecurity: Not on file  Transportation Needs: Not on file  Physical Activity: Not on file  Stress: Not on file  Social Connections: Not on file   No Known Allergies  Medications   Medications Prior to Admission  Medication Sig Dispense Refill Last Dose  . clotrimazole (LOTRIMIN) 1 % cream Apply to affected area 2 times daily 15 g 0   . Darunavir-Cobicisctat-Emtricitabine-Tenofovir Alafenamide (SYMTUZA) 800-150-200-10 MG TABS Take 1 tablet by mouth daily with breakfast. 30 tablet 3   . omeprazole (PRILOSEC) 40 MG capsule Take 1 capsule (40 mg total) by mouth daily. 30 capsule 3   . oxyCODONE-acetaminophen (PERCOCET/ROXICET) 5-325 MG tablet Take 2 tablets by mouth every 6 (six) hours as needed for severe pain. 12 tablet 0      Vitals   Vitals:   11/16/20 2200 11/16/20 2345 11/17/20 0200 11/17/20 0607  BP: (!) 154/100 (!) 148/84 (!) 144/90 (!) 157/88  Pulse: 95 91 79   Resp: 20 20 20 20   Temp: 98.3 F (36.8 C) 98.1 F (36.7 C) 98.1 F (36.7 C) 98 F (36.7 C)  TempSrc: Axillary Axillary Axillary Axillary  SpO2: 99% 93% 94%   Weight:      Height:         Body mass index is 20.99 kg/m.  Physical Exam   Physical Exam Gen: obtunded, stirs in response to noxious stimuli only HEENT: Atraumatic, normocephalic;mucous membranes moist; oropharynx clear, tongue without atrophy or fasciculations. Neck: Supple, trachea midline. Resp: CTAB, no w/r/r CV: RRR, no m/g/r; nml S1 and S2. 2+ symmetric peripheral pulses.   Neuro: MS: obtunded. Stirs in response to noxious stimuli only Speech: no intelligible speech CN: PERRL 54mm,  (+) corneals, oculocephalics, gag, cough Motor and sensory: withdraws to noxious stimuli in all extremities  Coordination, gait: UTA 2/2 encephalopathy  1a Level of Conscious.: 2 1b LOC Questions: 2 1c LOC Commands: 2 2 Best Gaze: 0 3 Visual: 0 4 Facial Palsy: 0 5a Motor Arm - left: 3 5b Motor Arm - Right: 3 6a Motor Leg - Left: 3 6b Motor Leg - Right: 3 7 Limb Ataxia: 0  8 Sensory: 0 9 Best Language: 3 10 Dysarthria: 0 11 Extinct. and Inatten.: 0 TOTAL: 21    Labs   CBC:  Recent Labs  Lab 11/16/20 1135  WBC 18.7*  NEUTROABS 15.9*  HGB 15.8  HCT 49.4  MCV 92.3  PLT 270    Basic Metabolic Panel:  Lab Results  Component Value Date   NA 146 (H) 11/17/2020   K 4.2 11/17/2020   CO2 18 (L) 11/17/2020   GLUCOSE 82 11/17/2020   BUN 76 (H) 11/17/2020   CREATININE 2.45 (H) 11/17/2020   CALCIUM 8.5 (L) 11/17/2020   GFRNONAA 28 (L) 11/17/2020   GFRAA  89 08/09/2020   Lipid Panel:  Lab Results  Component Value Date   LDLCALC 75 07/21/2019   HgbA1c: No results found for: HGBA1C Urine Drug Screen:     Component Value Date/Time   LABOPIA NONE DETECTED 11/16/2020 1700   COCAINSCRNUR POSITIVE (A) 11/16/2020 1700   LABBENZ NONE DETECTED 11/16/2020 1700   AMPHETMU NONE DETECTED 11/16/2020 1700   THCU NONE DETECTED 11/16/2020 1700   LABBARB NONE DETECTED 11/16/2020 1700    Alcohol Level     Component Value Date/Time   ETH <10 11/16/2020 1133     Impression   Aleks Nicolosi is a 65 y.o. 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 Leukocytosis 18.4 in setting of AIDS (CD4 197) with unknown source. UDS cocaine (+)  Recommendations   - Plan for LP today: cell count x2, glucose, protein, gram stain, culture, crypto antigen, HSV CMV EBV HHV6 enterovirus  PCR - Empiric CNS coverage w/ vanc 1 g q 8 hrs, cefepime 2g q 8 hrs, acyclovir 10 mg/kg q 8 hrs, ampicillin 2g IV q 4 hrs - Pharm consults placed for vanc dosing and acyclovir dosing in setting of AKI - Appreciate ID consulting; f/u recs - cEEG - No indication to start AED unless EEG shows epileptiform abnl - MRA H&N after unhooked from EEG (no CTA 2/2 Cr 2.45) - TTE w/ bubble - Check A1c and LDL + add statin per guidelines - Start ASA 81mg  daily - q4 hr neuro checks - STAT head CT for any change in neuro exam - Tele - PT/OT/SLP when able to participate - Stroke education - Amb referral to neurology upon discharge w/ amb cardiac monitoring  Will continue to follow  ______________________________________________________________________   Thank you for the opportunity to take part in the care of this patient. If you have any further questions, please contact the neurology consultation attending.  Signed,  Su Monks, MD [facility] [phone]  If 7pm- 7am, please page neurology on call as listed in [place].

## 2020-11-17 NOTE — Progress Notes (Signed)
Stat  EEG complete - results pending.  

## 2020-11-17 NOTE — Progress Notes (Addendum)
Pharmacy Antibiotic Note  [name] is a 65 y.o. male admitted on 11/16/2020 with AMS, possible meningitis.  Pharmacy has been consulted for Vancomycin  dosing.  Plan: Vancomycin 750 mg IV q24h Change Acyclovir 600 mg IV q12h Change Cefepime 2 g IV q24h Change Ampicillin 2 g IV q8h  Height: 5\' 7"  (170.2 cm) Weight: 60.8 kg (134 lb 0.6 oz) IBW/kg (Calculated) : 66.1  Temp (24hrs), Avg:98.2 F (36.8 C), Min:98 F (36.7 C), Max:98.4 F (36.9 C)  Recent Labs  Lab 11/16/20 1125 11/16/20 1135 11/16/20 1351 11/16/20 1826 11/16/20 2254 11/17/20 0134  WBC  --  18.7*  --   --   --   --   CREATININE  --  5.21*  --  3.32*  --  2.45*  LATICACIDVEN 3.9*  --  3.5*  --  3.2*  --     Estimated Creatinine Clearance: 25.5 mL/min (A) (by C-G formula based on SCr of 2.45 mg/dL (H)).    No Known Allergies   Caryl Pina 11/17/2020 6:49 AM

## 2020-11-17 NOTE — Procedures (Signed)
Patient Name: [name]  MRN: [id number]  Epilepsy Attending: Lora Havens  Referring Physician/Provider: Dr Su Monks Duration: 11/17/2020 0234 to 11/18/2020 0234  Patient history: 64yo M with ams. EEG to evaluate for seizure  Level of alertness: Awake, asleep  AEDs during EEG study: None  Technical aspects: This EEG study was done with scalp electrodes positioned according to the 10-20 International system of electrode placement. Electrical activity was acquired at a sampling rate of 500Hz  and reviewed with a high frequency filter of 70Hz  and a low frequency filter of 1Hz . EEG data were recorded continuously and digitally stored.   Description: The posterior dominant rhythm consists of 8 Hz activity of moderate voltage (25-35 uV) seen predominantly in posterior head regions, symmetric and reactive to eye opening and eye closing. Sleep was characterized by vertex waves, sleep spindles (12 to 14 Hz), maximal frontocentral region.  EEG showed continuous generalized predominantly 5 to 6 Hz theta slowing as well as intermittent 2-3Hz  delta slowing.  Hyperventilation and photic stimulation were not performed.     Of note, parts of study were difficult to interpret due to significant electrode artifact.  ABNORMALITY - Continuous slow, generalized  IMPRESSION: This study is suggestive of mild to moderate diffuse encephalopathy, nonspecific etiology. No seizures or epileptiform discharges were seen throughout the recording.  [name]

## 2020-11-17 NOTE — Consult Note (Addendum)
Cardiology Consultation:   Patient ID: [name] MRN: [id number]; DOB: 1955-09-15  Admit date: 11/16/2020 Date of Consult: 11/17/2020  PCP:  [name], [name], MD   [place]  Cardiologist:  New to [place]; Dr. Gardiner Rhyme Advanced Practice Provider:  No care team member to display Electrophysiologist:  None   Patient Profile:   [name] is a 65 y.o. 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 Philipp Ovens.  History of Present Illness:   [name] presented to the ED with AMS. He was reportedly in his usual state of health 11/15/20, however was found down at home 11/16/20 next to a can of beer and snacks. He was noted to have clear, NB emesis and was incontinent of stool. EMS activated and patient was brought to the ED for further evaluation. He was noted to have blisters on his knees/hands suggestive of prolonged immobilization on arrival. He was admitted to medicine for acute encephalopathy with AKI, hyperkalemia, rhabdomyolysis, abnormal CT c/f possible infarction, and acute liver injury. Neurology consulted following MRI showing extensive acute cerebrovascular ischemia. 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 He remains on IV antibiotics and antivirals. Echocardiogram showed EF 45-50%, G1DD, anteroseptal and anterior LV wall motion abnormalities, moderate LVH, mild AI; wall motion abnormalities and reduced EF c/f possible LAD lesion - no comparison available. Cardiology asked to evaluate for abnormal echo.   Patient has no known CAD history though noted to have aortic atherosclerosis on prior CT imaging. No prior ischemic testing.   BP has been markedly elevated this admission with intermittent tachypnea and tachycardia. EKG this admission  showed sinus rhythm, rate 93 bpm, LVH with repol abnormalities, chronic STD/TWI in inferolateral leads, no STE/D. Labs today with Na 146, K 4.2 (improved from 5.9 on admission), Cr 2.45 (improved from 5.21 on admission), LFTs downtrending, WBC 14.1, Hgb 14.9, PLT 244, HsTrop 149>127, CK 5920 (improved from 9000), lactate 3.2.   At the time of this evaluation he remains encephalopathic and unable to participate in history taking.    Past Medical History:  Diagnosis Date  . Acute kidney failure    unspec, secondary to medications  . AIDS ([place]) 06/07/2016  . Alcohol abuse 05/03/2016  . anal ca dx'd 02/2011  . Cachexia ([employer])   . 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 Drug abuse ([place])   . GERD (gastroesophageal reflux disease) 08/09/2016  . Grieving 11/29/2017  . Hemorrhoid   . Hepatitis C   . History of radiation therapy 01/01/2011 -05/18/11   anal, pelvis, inguinal lymph nodes  . HIV (human immunodeficiency virus infection) ([place])   . HIV or AIDS   . Housing problems 10/22/2020  . Hypertension   . Loose stools 06/07/2016  . Mass of anus 01/26/2011   squamous cell cancer  . Meningitis   . Mood disorder ([place])   . Pain    right leg  . Pulmonary nodule   . Rash and nonspecific skin eruption 03/31/2015  . Rectal bleeding 08/09/2020  . Testicular hypofunction     Past Surgical History:  Procedure Laterality Date  . MASS EXCISION  01/26/2011   squamous cell carcinoma     Home Medications:  Prior to Admission medications   Medication Sig Start Date End Date Taking? Authorizing Provider  clotrimazole (  LOTRIMIN) 1 % cream Apply to affected area 2 times daily 10/06/19   Faustino Congress, NP  Darunavir-Cobicisctat-Emtricitabine-Tenofovir Alafenamide [facility]) [phone] MG TABS Take 1 tablet by mouth daily with breakfast. 04/21/20   [name], [name], MD  omeprazole (PRILOSEC) 40 MG capsule Take 1 capsule (40 mg total) by mouth daily. 08/04/19   Golden Circle, FNP  oxyCODONE-acetaminophen  (PERCOCET/ROXICET) 5-325 MG tablet Take 2 tablets by mouth every 6 (six) hours as needed for severe pain. 10/23/20   Noemi Chapel, MD    Inpatient Medications: Scheduled Meds: . aspirin  81 mg Oral Daily   Or  . aspirin  300 mg Rectal Daily  . Chlorhexidine Gluconate Cloth  6 each Topical Daily  . lidocaine (cardiac) 100 mg/80mL      . sodium chloride flush  3 mL Intravenous Q12H   Continuous Infusions: . [START ON 11/18/2020] acyclovir    . ampicillin (OMNIPEN) IV    . cefTRIAXone (ROCEPHIN)  IV    . lactated ringers    . thiamine injection     PRN Meds: acetaminophen **OR** acetaminophen  Allergies:   No Known Allergies  Social History:   Social History   Socioeconomic History  . Marital status: Single    Spouse name: Not on file  . Number of children: Not on file  . Years of education: Not on file  . Highest education level: Not on file  Occupational History  . Not on file  Tobacco Use  . Smoking status: Current Every Day Smoker  . Smokeless tobacco: Never Used  . Tobacco comment: 2-3 day  Vaping Use  . Vaping Use: Never used  Substance and Sexual Activity  . Alcohol use: Yes    Alcohol/week: 6.0 standard drinks    Types: 6 Cans of beer per week  . Drug use: Not Currently  . Sexual activity: Not Currently    Partners: Male    Birth control/protection: Condom    Comment: given condoms  Other Topics Concern  . Not on file  Social History Narrative   ** Merged History Encounter **       Social Determinants of Health   Financial Resource Strain: Not on file  Food Insecurity: Not on file  Transportation Needs: Not on file  Physical Activity: Not on file  Stress: Not on file  Social Connections: Not on file  Intimate Partner Violence: Not on file    Family History:    Family History  Family history unknown: Yes     ROS:  Please see the history of present illness.   All other ROS reviewed and negative.     Physical Exam/Data:   Vitals:    11/17/20 0200 11/17/20 0607 11/17/20 0822 11/17/20 1309  BP: (!) 144/90 (!) 157/88 (!) 163/89 (!) 155/96  Pulse: 79   94  Resp: 20 20 20 20   Temp: 98.1 F (36.7 C) 98 F (36.7 C) 98 F (36.7 C) 98.1 F (36.7 C)  TempSrc: Axillary Axillary Axillary Oral  SpO2: 94%  94% 100%  Weight:      Height:        Intake/Output Summary (Last 24 hours) at 11/17/2020 1617 Last data filed at 11/17/2020 1511 Gross per 24 hour  Intake 3893.61 ml  Output 3500 ml  Net 393.61 ml   Last 3 Weights 11/16/2020 10/22/2020 10/22/2020  Weight (lbs) 134 lb 0.6 oz 134 lb 134 lb  Weight (kg) 60.8 kg 60.782 kg 60.782 kg  Some encounter information is  confidential and restricted. Go to Review Flowsheets activity to see all data.     Body mass index is 20.99 kg/m.  General:  Chronically ill appearing gentleman laying in bed in NAD HEENT: atraumatic Neck: no JVD Vascular: No carotid bruits; distal pulses 2+ bilaterally  Cardiac:  normal S1, S2; RRR; no murmurs, rubs, or gallops Lungs:  clear to auscultation bilaterally, no wheezing, rhonchi or rales  Abd: soft, nontender, no hepatomegaly  Ext: no edema Musculoskeletal:  No deformities, BUE and BLE strength normal and equal Skin: warm and dry  Neuro:  CNs 2-12 intact, no focal abnormalities noted Psych:  Normal affect   EKG:  The EKG was personally reviewed and demonstrates:  sinus rhythm, rate 93 bpm, LVH with repol abnormalities, chronic STD/TWI in inferolateral leads, no STE/D. Telemetry:  Telemetry was personally reviewed and demonstrates:  Sinus rhythm with occasional PVCs/PACs and one 8 beat run of NSVT  Relevant CV Studies: Echocardiogram 11/17/20: 1. Left ventricular ejection fraction, by estimation, is 45 to 50%. The  left ventricle has mildly decreased function. The left ventricle  demonstrates regional wall motion abnormalities (see scoring  diagram/findings for description). There is hypokinesis  of the anterior and anteroseptal LV wall  segments. There is moderate  concentric left ventricular hypertrophy. Left ventricular diastolic  parameters are consistent with Grade I diastolic dysfunction (impaired  relaxation).  2. Right ventricular systolic function is normal. The right ventricular  size is normal. There is normal pulmonary artery systolic pressure.  3. Moderate pleural effusion in the left lateral region.  4. The mitral valve is normal in structure. Trivial mitral valve  regurgitation.  5. The aortic valve is tricuspid. There is mild calcification of the  aortic valve. There is mild thickening of the aortic valve. Aortic valve  regurgitation is mild. Mild aortic valve sclerosis is present, with no  evidence of aortic valve stenosis.  6. The inferior vena cava is normal in size with greater than 50%  respiratory variability, suggesting right atrial pressure of 3 mmHg.  7. There is mildly reduced LVEF with anterior and anteroseptal wall  motion abnormalities concerning for possible LAD lesion.   Laboratory Data:  High Sensitivity Troponin:   Recent Labs  Lab 11/16/20 1845 11/16/20 2254  TROPONINIHS 149* 127*     Chemistry Recent Labs  Lab 11/16/20 1135 11/16/20 1826 11/17/20 0134  NA 141 143 146*  K 5.9* 4.6 4.2  CL 105 109 114*  CO2 19* 17* 18*  GLUCOSE 105* 91 82  BUN 95* 86* 76*  CREATININE 5.21* 3.32* 2.45*  CALCIUM 8.9 8.5* 8.5*  GFRNONAA 11* 20* 28*  ANIONGAP 17* 17* 14    Recent Labs  Lab 11/16/20 1135 11/17/20 0134  PROT 8.6* 6.5  ALBUMIN 3.3* 2.4*  AST 254* 213*  ALT 113* 94*  ALKPHOS 75 51  BILITOT 1.1 1.1   Hematology Recent Labs  Lab 11/16/20 1135 11/17/20 0622  WBC 18.7* 14.1*  RBC 5.35 4.97  HGB 15.8 14.9  HCT 49.4 45.7  MCV 92.3 92.0  MCH 29.5 30.0  MCHC 32.0 32.6  RDW 15.4 15.0  PLT 293 244   BNPNo results for input(s): BNP, PROBNP in the last 168 hours.  DDimer No results for input(s): DDIMER in the last 168 hours.   Radiology/Studies:  CT Head Wo  Contrast  Result Date: 11/16/2020 CLINICAL DATA:  Altered mental status. History of anorectal carcinoma. EXAM: CT HEAD WITHOUT CONTRAST TECHNIQUE: Contiguous axial images were obtained from the base of  the skull through the vertex without intravenous contrast. COMPARISON:  Head CT 10/19/2008. FINDINGS: Brain: There is a rounded focus of hypoattenuation in the posterior right parietal lobe measuring 2.1 by 1.7 by 1.3 cm. Scattered hypoattenuation in the subcortical and periventricular deep white matter is noted. Remote lacunar infarctions in the basal ganglia are seen. No hemorrhage, hydrocephalus or midline shift. Vascular: Atherosclerosis noted. Skull: Intact.  No focal lesion. Sinuses/Orbits: Negative. Other: None. IMPRESSION: Rounded focus of hypoattenuation in the posterior right parietal lobe may be due to an infarct but cannot be definitively characterized. Brain MRI with and without contrast is recommended for further evaluation. Chronic microvascular ischemic change. Electronically Signed   By: Inge Rise M.D.   On: 11/16/2020 14:43   MR BRAIN WO CONTRAST  Result Date: 11/16/2020 CLINICAL DATA:  Initial evaluation for acute mental status change, unknown cause. EXAM: MRI HEAD WITHOUT CONTRAST TECHNIQUE: Multiplanar, multiecho pulse sequences of the brain and surrounding structures were obtained without intravenous contrast. COMPARISON:  Prior head CT from earlier the same day. FINDINGS: Brain: Diffuse prominence of the CSF containing spaces compatible with generalized cerebral atrophy. Patchy T2/FLAIR hyperintensity within the periventricular and deep white matter both cerebral hemispheres as well as the pons, consistent with chronic microvascular ischemic disease, moderate in nature. 2.6 area of restricted diffusion involving the cortical and subcortical aspect of the right occipital pole, consistent with acute to early subacute ischemic infarct (series 5, image 76). Finding corresponds with  abnormality on prior CT. Associated mild petechial hemorrhage without frank hemorrhagic transformation or significant mass effect. Multiple additional scattered foci of restricted diffusion involving the cortical and subcortical aspect of the bilateral cerebral hemispheres are seen, also consistent with acute ischemic changes (series 5, images 91, 86, 79). Patchy involvement of the caudate and lentiform nuclei noted bilaterally. Few additional scattered punctate ischemic infarcts noted involving the bilateral cerebellar hemispheres. No associated hemorrhage or significant mass effect. Additionally, there is abnormal symmetric restricted diffusion and FLAIR signal abnormality involving the globus pallidi bilaterally (series 5, image 75). Appearance is most typical of a superimposed hypoxic ischemic injury versus toxic metabolic derangement. Additionally, there is abnormal restricted diffusion involving the mesial left temporal lobe with involvement of the left hippocampus (series 5, image 70). While this finding may be ischemic in nature, superimposed changes of seizure could also be considered. No mass lesion, significant mass effect, or midline shift. No hydrocephalus or extra-axial fluid collection. Pituitary gland and suprasellar region normal. Midline structures intact. Vascular: Major intracranial vascular flow voids are maintained. Skull and upper cervical spine: Craniocervical junction within normal limits. Signal intensity within the visualized bone marrow of the upper cervical spine is diffusely heterogeneous and decreased on T1 weighted imaging, nonspecific, but most commonly related to anemia, smoking, or obesity. No focal marrow replacing lesion. No scalp soft tissue abnormality. Sinuses/Orbits: Globes and orbital soft tissues demonstrate no acute finding. Scattered mucosal thickening noted throughout the paranasal sinuses. No significant mastoid effusion. Inner ear structures grossly normal. Other:  None. IMPRESSION: 1. 2.6 area of acute to early subacute ischemic infarct involving the right occipital pole, corresponding with abnormality on prior CT. Associated mild petechial hemorrhage without frank hemorrhagic transformation or significant mass effect. 2. Multiple additional scattered acute to early subacute ischemic infarcts involving the cortical and subcortical aspect of both cerebral and cerebellar hemispheres. Overall, a central thromboembolic etiology is suspected given the various vascular distributions involved. 3. Additional abnormal symmetric restricted diffusion involving the globus pallidi bilaterally. Appearance is most characteristic of a  superimposed/concomitant hypoxic ischemic injury versus toxic metabolic derangement. 4. Restricted diffusion involving the mesial left temporal lobe with involvement of the left hippocampus. While this finding may be ischemic in nature as well, superimposed changes of seizure and/or limbic encephalitis could also be considered. Correlation with EEG recommended. 5. Underlying age-related cerebral atrophy with moderate chronic microvascular ischemic disease. Electronically Signed   By: Jeannine Boga M.D.   On: 11/16/2020 21:26   DG Chest Portable 1 View  Result Date: 11/16/2020 CLINICAL DATA:  Altered mental status EXAM: PORTABLE CHEST 1 VIEW COMPARISON:  08/24/2020 FINDINGS: Cardiac shadow is within normal limits. Tortuous thoracic aorta is noted accentuated by patient rotation. The lungs are clear bilaterally. Old rib fractures on the left are seen and stable. No new focal abnormality is noted. IMPRESSION: No acute abnormality seen. Electronically Signed   By: Inez Catalina M.D.   On: 11/16/2020 12:00   EEG adult  Result Date: 11/17/2020 Derek Jack, MD     11/17/2020  8:00 AM Routine EEG Report [name] is a 65 y.o. male with a history of acute encephalopathy who is undergoing an EEG to evaluate for seizures. Report: This EEG was  acquired with electrodes placed according to the International 10-20 electrode system (including Fp1, Fp2, F3, F4, C3, C4, P3, P4, O1, O2, T3, T4, T5, T6, A1, A2, Fz, Cz, Pz). The following electrodes were missing or displaced: none. The best background activity was 5-6 Hz with intermittent delta slowing. This activity is reactive to stimulation. Sleep was characterized by vertex waves, sleep spindles (12 to 14 Hz). There was no focal slowing. There were no interictal epileptiform discharges. There were no electrographic seizures identified. Photic stimulation and hyperventilation were not performed. Impression and clinical correlation: This EEG was obtained while obtunded and is abnormal due to moderate diffuse slowing suggestive of general cerebral dysfunction. Su Monks, MD [facility] [phone] If 7pm- 7am, please page neurology on call as listed in [place].     ECHOCARDIOGRAM LIMITED  Result Date: 11/17/2020    ECHOCARDIOGRAM LIMITED REPORT   Patient Name:   [name] Date of Exam: 11/17/2020 Medical Rec #:  [id number]          Height:       67.0 in Accession #:    [id number]         Weight:       134.0 lb Date of Birth:  1955-09-15          BSA:          1.706 m Patient Age:    65 years           BP:           163/89 mmHg Patient Gender: M                  HR:           74 bpm. Exam Location:  Inpatient Procedure: Limited Echo, Limited Color Doppler and Cardiac Doppler Indications:    Elevated Troponin  History:        Patient has no prior history of Echocardiogram examinations.                 Risk Factors:Hypertension and ETOH.  Sonographer:    Bernadene Person RDCS Referring Phys: [id number] [place]  1. Left ventricular ejection fraction, by estimation, is 45 to 50%. The left ventricle has mildly decreased function. The left ventricle demonstrates regional wall motion abnormalities (see  scoring diagram/findings for description). There is hypokinesis  of the  anterior and anteroseptal LV wall segments. There is moderate concentric left ventricular hypertrophy. Left ventricular diastolic parameters are consistent with Grade I diastolic dysfunction (impaired relaxation).  2. Right ventricular systolic function is normal. The right ventricular size is normal. There is normal pulmonary artery systolic pressure.  3. Moderate pleural effusion in the left lateral region.  4. The mitral valve is normal in structure. Trivial mitral valve regurgitation.  5. The aortic valve is tricuspid. There is mild calcification of the aortic valve. There is mild thickening of the aortic valve. Aortic valve regurgitation is mild. Mild aortic valve sclerosis is present, with no evidence of aortic valve stenosis.  6. The inferior vena cava is normal in size with greater than 50% respiratory variability, suggesting right atrial pressure of 3 mmHg.  7. There is mildly reduced LVEF with anterior and anteroseptal wall motion abnormalities concerning for possible LAD lesion. . Comparison(s): No prior Echocardiogram. FINDINGS  Left Ventricle: Left ventricular ejection fraction, by estimation, is 45 to 50%. The left ventricle has mildly decreased function. The left ventricle demonstrates regional wall motion abnormalities. There is hypokinesis of the anterior and anteroseptal LV wall segments. The left ventricular internal cavity size was normal in size. There is moderate concentric left ventricular hypertrophy. Left ventricular diastolic parameters are consistent with Grade I diastolic dysfunction (impaired relaxation). Normal left ventricular filling pressure. Right Ventricle: The right ventricular size is normal. No increase in right ventricular wall thickness. Right ventricular systolic function is normal. There is normal pulmonary artery systolic pressure. The tricuspid regurgitant velocity is 2.17 m/s, and  with an assumed right atrial pressure of 3 mmHg, the estimated right ventricular systolic  pressure is 89.3 mmHg. Pericardium: There is no evidence of pericardial effusion. Mitral Valve: The mitral valve is normal in structure. There is mild thickening of the mitral valve leaflet(s). Mild mitral annular calcification. Trivial mitral valve regurgitation. Tricuspid Valve: The tricuspid valve is normal in structure. Tricuspid valve regurgitation is trivial. Aortic Valve: The aortic valve is tricuspid. There is mild calcification of the aortic valve. There is mild thickening of the aortic valve. Aortic valve regurgitation is mild. Aortic regurgitation PHT measures 569 msec. Mild aortic valve sclerosis is present, with no evidence of aortic valve stenosis. Pulmonic Valve: The pulmonic valve was not well visualized. Pulmonic valve regurgitation is not visualized. Venous: The inferior vena cava is normal in size with greater than 50% respiratory variability, suggesting right atrial pressure of 3 mmHg. Additional Comments: There is a moderate pleural effusion in the left lateral region. LEFT VENTRICLE PLAX 2D LVIDd:         4.40 cm     Diastology LVIDs:         3.40 cm     LV e' medial:    7.76 cm/s LV PW:         1.30 cm     LV E/e' medial:  6.4 LV IVS:        1.10 cm     LV e' lateral:   10.00 cm/s LVOT diam:     2.20 cm     LV E/e' lateral: 4.9 LV SV:         63 LV SV Index:   37 LVOT Area:     3.80 cm  LV Volumes (MOD) LV vol d, MOD A2C: 64.6 ml LV vol d, MOD A4C: 71.3 ml LV vol s, MOD A2C: 31.4 ml LV vol s,  MOD A4C: 36.3 ml LV SV MOD A2C:     33.2 ml LV SV MOD A4C:     71.3 ml LV SV MOD BP:      33.7 ml RIGHT VENTRICLE RV S prime:     11.70 cm/s TAPSE (M-mode): 2.1 cm LEFT ATRIUM         Index LA diam:    2.30 cm 1.35 cm/m  AORTIC VALVE LVOT Vmax:   90.10 cm/s LVOT Vmean:  54.100 cm/s LVOT VTI:    0.167 m AI PHT:      569 msec  AORTA Ao Root diam: 3.20 cm MITRAL VALVE               TRICUSPID VALVE MV Area (PHT): 3.27 cm    TR Peak grad:   18.8 mmHg MV Decel Time: 232 msec    TR Vmax:        217.00 cm/s MV  E velocity: 49.30 cm/s MV A velocity: 53.60 cm/s  SHUNTS MV E/A ratio:  0.92        Systemic VTI:  0.17 m                            Systemic Diam: 2.20 cm Gwyndolyn Kaufman MD Electronically signed by Gwyndolyn Kaufman MD Signature Date/Time: 11/17/2020/10:24:55 AM    Final      Assessment and Plan:   1. Acute combined CHF: patient presented with AMS, found to have extensive CVA on MRI this admission. Echo obtained showing EF 45-50% with anterior/anteroseptal RWMA. No prior CAD history. BP has been markedly elevated this admission. EKG with chronic STD/TWI in inferolateral leads. HsTrop with low flat trend not c/w ACS. Echo is concerning for possible LAD territory ischemia. No prior ischemic evaluation though noted to have aortic atherosclerosis on prior CT. Unfortunately Cr remains significantly elevate at 2.45, though improved from 5.21 on admission which limits definitive ischemic evaluation at this time. Additionally complicating management is his acute CVA and cocaine abuse.  - Will check FLP/A1C for risk stratification - Continue to monitor volume status closely with ongoing IVF resuscitation efforts.  - No ischemic evaluation at this time - could consider LHC in the future should he make a meaningful recovery.  - Consider addition of carvedilol (+/- ARB if AKI resolves) for GDMT. Will defer timing of initiation to neurology given acute CVA.   2. HTN: no formal diagnosis prior to admission, though BP has been significantly elevated on several recent ED/outpatient visits. Not on antihypertensives prior to admission. Possible this contributed to his cardiomyopathy and/or his CVA. - Allowing permissive HTN in the setting of acute CVA - Anticipate management in the setting of #1.   3. Acute encephalopathy in the setting of acute CVA: patient found down at home for unknown amount of time. Neuro and ID following. He underwent LP given c/f HSV encephalopathy. He remains on IV antibiotics and  antivirals.  - Continue management per primary team  4. AKI: Cr elevated to 5.21 on admission, down to 2.45 today, though remains above baseline of 1.0-1.2. He continues on IVF  - Continue management per primary team  5. Polysubstance abuse: reported ETOH and cocaine use in the past. Utox + for cocaine this admission. Unclear what roll this is playing in his CVA and cardiomyopathy - Continue to encourage cessation  6. AIDS: CD4 counts remain low. ID following. HAART on hold - Continue management per primary team and ID  Risk Assessment/Risk Scores:      New York Heart Association (NYHA) Functional Class Unable to assess    For questions or updates, please contact [facility] Please consult [email or web address] for contact info under    Signed, Abigail Butts, PA-C  11/17/2020 4:17 PM  Patient seen and examined.  Agree with above documentation.  [name] is a 65 year old male with history of HIV, polysubstance abuse who we are consulted to see for evaluation of abnormal echocardiogram.  He presented to the ED on 4/12 after being found down at his home.  Head CT suggested possible infarction.  Brain MRI showed extensive acute cerebrovascular ischemia.  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  Course has also been complicated by AKI, hyperkalemia, rhabdomyolysis, acute liver injury.  Echocardiogram was done today which showed LVEF 45 to 50% with anteroseptal/anterior wall motion abnormality, moderate LVH, grade 1 diastolic dysfunction.  EKG shows sinus tachycardia, rate 93, LVH with repolarization abnormalities.  Telemetry shows normal sinus rhythm with rates in 90s, 9 beat run of NSVT.  On exam, patient follows some commands but not responding to questions, regular rate and rhythm, no murmurs, lungs CTAB, no LE edema.  For his acute combined heart failure, no further work-up recommended at this time.  Given his acute CVA, AKI, and encephalopathy,  not a candidate for invasive work-up at this time.  Can plan ischemia evaluation once he recovers from his acute illness.  In the meantime would recommend folding in GDMT as able.  Donato Heinz, MD

## 2020-11-17 NOTE — Consult Note (Signed)
[facility] Nurse Consult Note: Patient receiving care in [facility] [phone]. Reason for Consult: many intact and ruptured blisters Wound type: exact cause yet to be determined Pressure Injury POA: Yes/No/NA Measurement: Wound bed: Drainage (amount, consistency, odor)  Periwound: Dressing procedure/placement/frequency: For areas with intact or ruptured blisters, place Xeroform gauze Kellie Simmering [phone]) over these sites. If possible, secure with a few turns of kerlex.  If securing with kerlex is not possible, cover with a foam dressing.  Change daily. [facility] nurse will not follow at this time.  Please re-consult the [place] team if needed.  Val Riles, RN, MSN, CWOCN, CNS-BC, pager [phone]

## 2020-11-17 NOTE — Consult Note (Signed)
Date of Admission:  11/16/2020          Reason for Consult: Encephalopathy and patient with HIV and AIDS    Referring Provider: Dr. Philipp Ovens   Assessment:  1. 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 Multiple ischemic infarcts throughout the brain some acute some subacute some evidence of hypoxic injury 3. Rhabdomyolysis with acute renal failure 4. Hepatitis 5. Bullous skin rash --possibly drug-induced vs syphilitic manifestation, para-neoplastic process, vasculitis? 6. HIV and AIDS more recently well controlled 7. 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 Untreated hepatitis C 9. Polysubstance abuse including crack cocaine and alcohol 10. She of rectal cancer with recent complaints of severe pain with defecation and blood per rectum over many months ( see my clinic noted)  Plan:  1. Agree with acyclovir  2. Agree with LP and send for CSF for cell count differential, glucose protein culture cryptococcal antigen VDRL, HSV PCR's.  3. Doubt he has a bacterial meningoencephalitis but reasonable to continue ceftriaxone and ampicillin prior to lumbar puncture (note I stopped vancomycin to avoid this nephrotoxic drug in the context of his rhabdomyolysis) 4. Check RPR 5. Work up for cause of multiple CVA's including obtaining echocardiogram 6. Aggressively fluid resuscitate 7. Greatly appreciate neurology consult 8. Would monitor for alcohol withdrawal 9. Hopefully as he recovers we can also also address his rectal pain and history of rectal cancer while he is in the inpatient perhaps we can find out from surgery what was done in their clinic 10. If his bullous skin rash is a drug rash due to darunavir we will need to reconfigure his antiretroviral regimen  Active Problems:   Acute encephalopathy   Pressure injury of  skin   Scheduled Meds: . aspirin  81 mg Oral Daily   Or  . aspirin  300 mg Rectal Daily  . Chlorhexidine Gluconate Cloth  6 each Topical Daily  . sodium chloride flush  3 mL Intravenous Q12H   Continuous Infusions: . acyclovir 600 mg (11/17/20 0928)  . ampicillin (OMNIPEN) IV    . cefTRIAXone (ROCEPHIN)  IV    . thiamine injection     PRN Meds:.acetaminophen **OR** acetaminophen, labetalol  HPI: [name] is a 65 y.o. male living with HIV that has been reasonably well-controlled recently viral loads typically suppressed from 2019 onwards.  He had a prior history of cryptococcal meningitis with recurrence (2 episodes in 2010).  He has comorbid chronic hepatitis C without hepatic coma, polysubstance abuse with history of alcohol and cocaine abuse.  He has had rectal cancer and more recently has been complaining of severe rectal pain with blood per rectum (going back to At least September of 2021 if not longer)  Currently [name] was found unresponsive yesterday on his friend's back porch after had not been seen for more than 24 hours.  Apparently his sister had seen him clipping his neighbors hedges the morning prior to his being found.  Fairly is found down near can of beer with some food and snacks in a backpack full of beer and hard liquor.  Sister stated that he had an episode of clear nonbloody vomiting and was incontinent of stool after being found.  He was obtunded and found to be with rhabdomyolysis and acute kidney injury.  He has curious bullous skin changes on his hands legs of unknown chronicity.  An drug screen was positive for cocaine.  His CD4 count was checked and was 197 though percent CD4 was fairly consistent with his last percent CD4 count.  MRI of the brain was obtained which showed multiple acute and subacute infarcts throughout the brain.  Is also evidence of anoxic changes particularly in the globus pallidus bilaterally.  There is been some restricted  diffusion involving the mesial left temporal lobe with involvement left hippocampus.  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  CPK is downtrending.  He appears more alert than he was described as being yesterday.  He is currently receiving acyclovir ampicillin cefepime.  He was prescribed vancomycin but I discontinue it due to my concerns that would be hazard and unnecessary nephrotoxicity in a patient with rhabdomyolysis he would not have a high likelihood of having penicillin-resistant pneumococcal meningoencephalitis.  Hopefully neurology can have a successful lumbar puncture today so we can see what his cell count differential look like.  If it is not consistent with bacterial meningitis I would DC ampicillin.  I would also DC ceftriaxone if he does not have evidence of neurosyphilis.  Would send HSV PCR's if they are negative we can back away from his acyclovir and perhaps the changes on temporal lobe are not due to herpes but instead due to a seizure.  His rash is certainly puzzling.  There is question of whether this could be induced by darunavir.  I am certainly seen during of your rashes though not commonly and have not seen in present with bullous changes such as he has.  Nonetheless I think it is reasonable to withhold his [place] for now.  We can certainly configure another antiretroviral regimen if drug-induced toxicity remains in the differential.       Review of Systems: Review of Systems  Unable to perform ROS: Patient nonverbal    Past Medical History:  Diagnosis Date  . Acute kidney failure    unspec, secondary to medications  . AIDS ([place]) 06/07/2016  . Alcohol abuse 05/03/2016  . anal ca dx'd 02/2011  . Cachexia ([place])   . 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 Drug abuse ([place])   . GERD (gastroesophageal reflux disease) 08/09/2016  . Grieving 11/29/2017  . Hemorrhoid   . Hepatitis C   . History of radiation therapy 01/01/2011 -05/18/11   anal, pelvis, inguinal lymph nodes  . HIV (human immunodeficiency virus infection) ([place])   . HIV or AIDS   . Housing problems 10/22/2020  . Hypertension   . Loose stools 06/07/2016  . Mass of anus 01/26/2011   squamous cell cancer  . Meningitis   . Mood disorder ([place])   . Pain    right leg  . Pulmonary nodule   . Rash and nonspecific skin eruption 03/31/2015  . Rectal bleeding 08/09/2020  . Testicular hypofunction     Social History   Tobacco Use  . Smoking status: Current Every Day Smoker  . Smokeless tobacco: Never Used  . Tobacco comment: 2-3 day  Vaping Use  . Vaping Use: Never used  Substance Use Topics  . Alcohol use: Yes    Alcohol/week: 6.0 standard drinks    Types: 6 Cans of beer per week  . Drug use: Not Currently    Family History  Family  history unknown: Yes   No Known Allergies  OBJECTIVE: Blood pressure (!) 155/96, pulse 94, temperature 98.1 F (36.7 C), temperature source Oral, resp. rate 20, height 5\' 7"  (1.702 m), weight 60.8 kg, SpO2 100 %.  Physical Exam Constitutional:      Appearance: He is cachectic.  HENT:     Head: Normocephalic and atraumatic.  Eyes:     Extraocular Movements: Extraocular movements intact.  Cardiovascular:     Rate and Rhythm: Tachycardia present.     Heart sounds: No murmur heard. No friction rub. No gallop.   Pulmonary:     Effort: Pulmonary effort is normal. No respiratory distress.     Breath sounds: Normal breath sounds. No stridor. No wheezing or rhonchi.  Abdominal:     General: Bowel sounds are normal. There is no distension.     Palpations: There is no mass.     Tenderness: There is no abdominal tenderness.     Hernia: No hernia is present.  Skin:    General: Skin is warm.     Findings: Rash present.  Neurological:      Comments: He is following commands to voice but not verbalizing any words   He has EEG in place  Bullous skin rash today November 17, 2020:        Lab Results Lab Results  Component Value Date   WBC 14.1 (H) 11/17/2020   HGB 14.9 11/17/2020   HCT 45.7 11/17/2020   MCV 92.0 11/17/2020   PLT 244 11/17/2020    Lab Results  Component Value Date   CREATININE 2.45 (H) 11/17/2020   BUN 76 (H) 11/17/2020   NA 146 (H) 11/17/2020   K 4.2 11/17/2020   CL 114 (H) 11/17/2020   CO2 18 (L) 11/17/2020    Lab Results  Component Value Date   ALT 94 (H) 11/17/2020   AST 213 (H) 11/17/2020   GGT 92 (H) 08/26/2014   ALKPHOS 51 11/17/2020   BILITOT 1.1 11/17/2020     Microbiology: Recent Results (from the past 240 hour(s))  Resp Panel by RT-PCR (Flu A&B, Covid) Nasopharyngeal Swab     Status: None   Collection Time: 11/16/20 11:28 AM   Specimen: Nasopharyngeal Swab; Nasopharyngeal(NP) swabs in vial transport medium  Result Value Ref Range Status   SARS Coronavirus 2 by RT PCR NEGATIVE NEGATIVE Final    Comment: (NOTE) SARS-CoV-2 target nucleic acids are NOT DETECTED.  The SARS-CoV-2 RNA is generally detectable in upper respiratory specimens during the acute phase of infection. The lowest concentration of SARS-CoV-2 viral copies this assay can detect is 138 copies/mL. A negative result does not preclude SARS-Cov-2 infection and should not be used as the sole basis for treatment or other patient management decisions. A negative result may occur with  improper specimen collection/handling, submission of specimen other than nasopharyngeal swab, presence of viral mutation(s) within the areas targeted by this assay, and inadequate number of viral copies(<138 copies/mL). A negative result must be combined with clinical observations, patient history, and epidemiological information. The expected result is Negative.  Fact Sheet for Patients:   EntrepreneurPulse.com.au  Fact Sheet for Healthcare Providers:  IncredibleEmployment.be  This test is no t yet approved or cleared by the [place] FDA and  has been authorized for detection and/or diagnosis of SARS-CoV-2 by FDA under an Emergency Use Authorization (EUA). This EUA will remain  in effect (meaning this test can be used) for the duration of the COVID-19 declaration under Section 564(b)(1) of  the Act, 21 U.S.C.section 360bbb-3(b)(1), unless the authorization is terminated  or revoked sooner.       Influenza A by PCR NEGATIVE NEGATIVE Final   Influenza B by PCR NEGATIVE NEGATIVE Final    Comment: (NOTE) The Xpert Xpress SARS-CoV-2/FLU/RSV plus assay is intended as an aid in the diagnosis of influenza from Nasopharyngeal swab specimens and should not be used as a sole basis for treatment. Nasal washings and aspirates are unacceptable for Xpert Xpress SARS-CoV-2/FLU/RSV testing.  Fact Sheet for Patients: EntrepreneurPulse.com.au  Fact Sheet for Healthcare Providers: IncredibleEmployment.be  This test is not yet approved or cleared by the [place] FDA and has been authorized for detection and/or diagnosis of SARS-CoV-2 by FDA under an Emergency Use Authorization (EUA). This EUA will remain in effect (meaning this test can be used) for the duration of the COVID-19 declaration under Section 564(b)(1) of the Act, 21 U.S.C. section 360bbb-3(b)(1), unless the authorization is terminated or revoked.  Performed at [facility], [place] [address] [place], [place] [address]   Culture, blood (routine x 2)     Status: None (Preliminary result)   Collection Time: 11/16/20 11:33 AM   Specimen: BLOOD  Result Value Ref Range Status   Specimen Description BLOOD SITE NOT SPECIFIED  Final   Special Requests   Final    BOTTLES DRAWN AEROBIC AND ANAEROBIC Blood Culture results may not be optimal  due to an inadequate volume of blood received in culture bottles   Culture   Final    NO GROWTH < 24 HOURS Performed at [facility], [place] [address] [place], [place] [address]    Report Status PENDING  Incomplete  Culture, blood (routine x 2)     Status: None (Preliminary result)   Collection Time: 11/16/20 11:33 AM   Specimen: BLOOD  Result Value Ref Range Status   Specimen Description BLOOD SITE NOT SPECIFIED  Final   Special Requests   Final    BOTTLES DRAWN AEROBIC AND ANAEROBIC Blood Culture results may not be optimal due to an inadequate volume of blood received in culture bottles   Culture   Final    NO GROWTH < 24 HOURS Performed at [facility], [place] [address], [place], [place] [address]    Report Status PENDING  Incomplete    Alcide Evener, [facility] [phone] pager  11/17/2020, 2:33 PM

## 2020-11-18 ENCOUNTER — Inpatient Hospital Stay (HOSPITAL_COMMUNITY): Payer: Medicare Other

## 2020-11-18 DIAGNOSIS — B20 Human immunodeficiency virus [HIV] disease: Secondary | ICD-10-CM | POA: Diagnosis not present

## 2020-11-18 DIAGNOSIS — N179 Acute kidney failure, unspecified: Secondary | ICD-10-CM | POA: Diagnosis not present

## 2020-11-18 DIAGNOSIS — I639 Cerebral infarction, unspecified: Secondary | ICD-10-CM

## 2020-11-18 DIAGNOSIS — G9340 Encephalopathy, unspecified: Secondary | ICD-10-CM | POA: Diagnosis not present

## 2020-11-18 DIAGNOSIS — I5041 Acute combined systolic (congestive) and diastolic (congestive) heart failure: Secondary | ICD-10-CM

## 2020-11-18 LAB — COMPREHENSIVE METABOLIC PANEL
ALT: 74 U/L — ABNORMAL HIGH (ref 0–44)
AST: 141 U/L — ABNORMAL HIGH (ref 15–41)
Albumin: 2 g/dL — ABNORMAL LOW (ref 3.5–5.0)
Alkaline Phosphatase: 48 U/L (ref 38–126)
Anion gap: 11 (ref 5–15)
BUN: 49 mg/dL — ABNORMAL HIGH (ref 8–23)
CO2: 20 mmol/L — ABNORMAL LOW (ref 22–32)
Calcium: 8.3 mg/dL — ABNORMAL LOW (ref 8.9–10.3)
Chloride: 119 mmol/L — ABNORMAL HIGH (ref 98–111)
Creatinine, Ser: 1.39 mg/dL — ABNORMAL HIGH (ref 0.61–1.24)
GFR, Estimated: 56 mL/min — ABNORMAL LOW (ref >60)
Glucose, Bld: 93 mg/dL (ref 70–99)
Potassium: 3.8 mmol/L (ref 3.5–5.1)
Sodium: 150 mmol/L — ABNORMAL HIGH (ref 135–145)
Total Bilirubin: 0.7 mg/dL (ref 0.3–1.2)
Total Protein: 6.1 g/dL — ABNORMAL LOW (ref 6.5–8.1)

## 2020-11-18 LAB — BASIC METABOLIC PANEL
Anion gap: 9 (ref 5–15)
Anion gap: 7 (ref 5–15)
BUN: 37 mg/dL — ABNORMAL HIGH (ref 8–23)
BUN: 34 mg/dL — ABNORMAL HIGH (ref 8–23)
CO2: 21 mmol/L — ABNORMAL LOW (ref 22–32)
CO2: 20 mmol/L — ABNORMAL LOW (ref 22–32)
Calcium: 8 mg/dL — ABNORMAL LOW (ref 8.9–10.3)
Calcium: 7.9 mg/dL — ABNORMAL LOW (ref 8.9–10.3)
Chloride: 120 mmol/L — ABNORMAL HIGH (ref 98–111)
Chloride: 122 mmol/L — ABNORMAL HIGH (ref 98–111)
Creatinine, Ser: 1.19 mg/dL (ref 0.61–1.24)
Creatinine, Ser: 1.16 mg/dL (ref 0.61–1.24)
GFR, Estimated: >60 mL/min (ref >60)
GFR, Estimated: >60 mL/min (ref >60)
Glucose, Bld: 101 mg/dL — ABNORMAL HIGH (ref 70–99)
Glucose, Bld: 100 mg/dL — ABNORMAL HIGH (ref 70–99)
Potassium: 3.7 mmol/L (ref 3.5–5.1)
Potassium: 4.4 mmol/L (ref 3.5–5.1)
Sodium: 150 mmol/L — ABNORMAL HIGH (ref 135–145)
Sodium: 149 mmol/L — ABNORMAL HIGH (ref 135–145)

## 2020-11-18 LAB — CBC WITH DIFFERENTIAL/PLATELET
Abs Immature Granulocytes: 0.14 10*3/uL — ABNORMAL HIGH (ref 0.00–0.07)
Basophils Absolute: 0.1 10*3/uL (ref 0.0–0.1)
Basophils Relative: 0 %
Eosinophils Absolute: 0 10*3/uL (ref 0.0–0.5)
Eosinophils Relative: 0 %
HCT: 43.5 % (ref 39.0–52.0)
Hemoglobin: 14 g/dL (ref 13.0–17.0)
Immature Granulocytes: 1 %
Lymphocytes Relative: 7 %
Lymphs Abs: 1 10*3/uL (ref 0.7–4.0)
MCH: 29.7 pg (ref 26.0–34.0)
MCHC: 32.2 g/dL (ref 30.0–36.0)
MCV: 92.4 fL (ref 80.0–100.0)
Monocytes Absolute: 1.4 10*3/uL — ABNORMAL HIGH (ref 0.1–1.0)
Monocytes Relative: 10 %
Neutro Abs: 11.6 10*3/uL — ABNORMAL HIGH (ref 1.7–7.7)
Neutrophils Relative %: 82 %
Platelets: 230 10*3/uL (ref 150–400)
RBC: 4.71 MIL/uL (ref 4.22–5.81)
RDW: 14.7 % (ref 11.5–15.5)
WBC: 14.2 10*3/uL — ABNORMAL HIGH (ref 4.0–10.5)
nRBC: 0 % (ref 0.0–0.2)

## 2020-11-18 LAB — RPR: RPR Ser Ql: NONREACTIVE

## 2020-11-18 LAB — LIPID PANEL
Cholesterol: 123 mg/dL (ref 0–200)
HDL: 53 mg/dL (ref >40)
LDL Cholesterol: 47 mg/dL (ref 0–99)
Total CHOL/HDL Ratio: 2.3 RATIO
Triglycerides: 115 mg/dL (ref <150)
VLDL: 23 mg/dL (ref 0–40)

## 2020-11-18 LAB — PROTIME-INR
INR: 1.1 (ref 0.8–1.2)
Prothrombin Time: 14.2 seconds (ref 11.4–15.2)

## 2020-11-18 LAB — HEMOGLOBIN A1C
Hgb A1c MFr Bld: 5.5 % (ref 4.8–5.6)
Mean Plasma Glucose: 111.15 mg/dL

## 2020-11-18 LAB — CK: Total CK: 3742 U/L — ABNORMAL HIGH (ref 49–397)

## 2020-11-18 LAB — VDRL, CSF: VDRL Quant, CSF: NONREACTIVE

## 2020-11-18 LAB — CREATININE, URINE, RANDOM: Creatinine, Urine: 70.24 mg/dL

## 2020-11-18 MED ORDER — DEXTROSE 5 % IV SOLN: INTRAVENOUS | Status: DC

## 2020-11-18 MED ORDER — SODIUM CHLORIDE 0.9 % IV SOLN: 2.0000 g | Freq: Two times a day (BID) | INTRAVENOUS | Status: DC

## 2020-11-18 MED ORDER — BICTEGRAVIR-EMTRICITAB-TENOFOV 50-200-25 MG PO TABS
1.0000 | ORAL_TABLET | Freq: Every day | ORAL | Status: DC
  Administered 2020-11-21 – 2020-11-22 (×2): 1 via ORAL
  Filled 2020-11-18 (×5): qty 1

## 2020-11-18 MED ORDER — DEXTROSE 5 % IV SOLN
600.0000 mg | Freq: Two times a day (BID) | INTRAVENOUS | Status: DC
  Administered 2020-11-18 – 2020-11-19 (×2): 600 mg via INTRAVENOUS
  Filled 2020-11-18 (×3): qty 12

## 2020-11-18 NOTE — Progress Notes (Signed)
PHARMACY NOTE:  ANTIMICROBIAL RENAL DOSAGE ADJUSTMENT  Current antimicrobial regimen includes a mismatch between antimicrobial dosage and estimated renal function.  As per policy approved by the Pharmacy & Therapeutics and Medical Executive Committees, the antimicrobial dosage will be adjusted accordingly.  Current antimicrobial dosage: Acyclovir 10 mg/kg every 12 hours  Indication: HSV encephalitis  Renal Function:  Estimated Creatinine Clearance: 45.6 mL/min (A) (by C-G formula based on SCr of 1.39 mg/dL (H)). []      On intermittent HD, scheduled: []      On CRRT    Antimicrobial dosage has been changed to:  Acyclovir 10 mg/kg every 12 hours   Additional comments:   Thank you for allowing pharmacy to be a part of this patient's care. [name] S. Alford Highland, PharmD, BCPS Clinical Staff Pharmacist [email or web address] 11/18/2020 11:40 AM

## 2020-11-18 NOTE — Progress Notes (Signed)
Lower extremity venous has been completed.   Preliminary results in CV Proc.   Abram Sander 11/18/2020 10:54 AM

## 2020-11-18 NOTE — Plan of Care (Signed)
  Problem: Education: Goal: Knowledge of disease or condition will improve Outcome: Not Progressing Goal: Knowledge of secondary prevention will improve Outcome: Not Progressing Goal: Knowledge of patient specific risk factors addressed and post discharge goals established will improve Outcome: Not Progressing Goal: Individualized Educational Video(s) Outcome: Not Progressing   Problem: Self-Care: Goal: Verbalization of feelings and concerns over difficulty with self-care will improve Outcome: Not Progressing Goal: Ability to communicate needs accurately will improve Outcome: Not Progressing   Problem: Coping: Goal: Will identify appropriate support needs Outcome: Progressing

## 2020-11-18 NOTE — Progress Notes (Addendum)
Progress Note  Patient Name: [name] Date of Encounter: 11/18/2020  [facility] Cardiologist: Donato Heinz, MD   Subjective   No acute overnight events. Patient alert and oriented to name and birthday today but could not tell me where he is. Difficult to understand his speech. Appears to be breathing comfortably. No chest pain.  Inpatient Medications    Scheduled Meds: . aspirin  81 mg Oral Daily   Or  . aspirin  300 mg Rectal Daily  . Chlorhexidine Gluconate Cloth  6 each Topical Daily  . sodium chloride flush  3 mL Intravenous Q12H   Continuous Infusions: . acyclovir    . dextrose 100 mL/hr at 11/18/20 1109  . thiamine injection 500 mg (11/18/20 1057)   PRN Meds: acetaminophen **OR** acetaminophen   Vital Signs    Vitals:   11/17/20 2350 11/18/20 0338 11/18/20 0650 11/18/20 0751  BP: (!) 159/87 (!) 169/61  (!) 165/87  Pulse: 82 77  70  Resp: 20 18  20   Temp: 97.6 F (36.4 C) 98 F (36.7 C)  98 F (36.7 C)  TempSrc: Axillary Axillary  Axillary  SpO2: 96% 93%  93%  Weight:   61.7 kg   Height:        Intake/Output Summary (Last 24 hours) at 11/18/2020 1145 Last data filed at 11/18/2020 0929 Gross per 24 hour  Intake 10 ml  Output 2100 ml  Net -2090 ml   Last 3 Weights 11/18/2020 11/16/2020 10/22/2020  Weight (lbs) 136 lb 0.4 oz 134 lb 0.6 oz 134 lb  Weight (kg) 61.7 kg 60.8 kg 60.782 kg  Some encounter information is confidential and restricted. Go to Review Flowsheets activity to see all data.      Telemetry    Normal sinus rhythm with rates in the 70's to 80's. One brief run of NSVT of 5 beats. - Personally Reviewed  ECG    No new ECG tracing today. - Personally Reviewed  Physical Exam   GEN: No acute distress.   Neck: No JVD. Cardiac: RRR. No murmurs, rubs, or gallops.  Radial pulses 2+ and equal bilaterally. Respiratory: Clear to auscultation bilaterally. No wheezes, rhonchi, or rales. GI: Soft, non-distended, and  non-tender. MS: No lower extremity edema. No deformity. Skin: Warm and dry. Neuro:  Alert but only oriented to name and birthday. Speech difficult to understand. Would not response to all commands. Psych: Flat affect.  Labs    High Sensitivity Troponin:   Recent Labs  Lab 11/16/20 1845 11/16/20 2254  TROPONINIHS 149* 127*      Chemistry Recent Labs  Lab 11/16/20 1135 11/16/20 1826 11/17/20 0134 11/18/20 0112  NA 141 143 146* 150*  K 5.9* 4.6 4.2 3.8  CL 105 109 114* 119*  CO2 19* 17* 18* 20*  GLUCOSE 105* 91 82 93  BUN 95* 86* 76* 49*  CREATININE 5.21* 3.32* 2.45* 1.39*  CALCIUM 8.9 8.5* 8.5* 8.3*  PROT 8.6*  --  6.5 6.1*  ALBUMIN 3.3*  --  2.4* 2.0*  AST 254*  --  213* 141*  ALT 113*  --  94* 74*  ALKPHOS 75  --  51 48  BILITOT 1.1  --  1.1 0.7  GFRNONAA 11* 20* 28* 56*  ANIONGAP 17* 17* 14 11     Hematology Recent Labs  Lab 11/16/20 1135 11/17/20 0622 11/18/20 0112  WBC 18.7* 14.1* 14.2*  RBC 5.35 4.97 4.71  HGB 15.8 14.9 14.0  HCT 49.4 45.7 43.5  MCV 92.3 92.0 92.4  MCH 29.5 30.0 29.7  MCHC 32.0 32.6 32.2  RDW 15.4 15.0 14.7  PLT 293 244 230    BNPNo results for input(s): BNP, PROBNP in the last 168 hours.   DDimer No results for input(s): DDIMER in the last 168 hours.   Radiology    CT Head Wo Contrast  Result Date: 11/16/2020 CLINICAL DATA:  Altered mental status. History of anorectal carcinoma. EXAM: CT HEAD WITHOUT CONTRAST TECHNIQUE: Contiguous axial images were obtained from the base of the skull through the vertex without intravenous contrast. COMPARISON:  Head CT 10/19/2008. FINDINGS: Brain: There is a rounded focus of hypoattenuation in the posterior right parietal lobe measuring 2.1 by 1.7 by 1.3 cm. Scattered hypoattenuation in the subcortical and periventricular deep white matter is noted. Remote lacunar infarctions in the basal ganglia are seen. No hemorrhage, hydrocephalus or midline shift. Vascular: Atherosclerosis noted. Skull:  Intact.  No focal lesion. Sinuses/Orbits: Negative. Other: None. IMPRESSION: Rounded focus of hypoattenuation in the posterior right parietal lobe may be due to an infarct but cannot be definitively characterized. Brain MRI with and without contrast is recommended for further evaluation. Chronic microvascular ischemic change. Electronically Signed   By: Inge Rise M.D.   On: 11/16/2020 14:43   MR BRAIN WO CONTRAST  Result Date: 11/16/2020 CLINICAL DATA:  Initial evaluation for acute mental status change, unknown cause. EXAM: MRI HEAD WITHOUT CONTRAST TECHNIQUE: Multiplanar, multiecho pulse sequences of the brain and surrounding structures were obtained without intravenous contrast. COMPARISON:  Prior head CT from earlier the same day. FINDINGS: Brain: Diffuse prominence of the CSF containing spaces compatible with generalized cerebral atrophy. Patchy T2/FLAIR hyperintensity within the periventricular and deep white matter both cerebral hemispheres as well as the pons, consistent with chronic microvascular ischemic disease, moderate in nature. 2.6 area of restricted diffusion involving the cortical and subcortical aspect of the right occipital pole, consistent with acute to early subacute ischemic infarct (series 5, image 76). Finding corresponds with abnormality on prior CT. Associated mild petechial hemorrhage without frank hemorrhagic transformation or significant mass effect. Multiple additional scattered foci of restricted diffusion involving the cortical and subcortical aspect of the bilateral cerebral hemispheres are seen, also consistent with acute ischemic changes (series 5, images 91, 86, 79). Patchy involvement of the caudate and lentiform nuclei noted bilaterally. Few additional scattered punctate ischemic infarcts noted involving the bilateral cerebellar hemispheres. No associated hemorrhage or significant mass effect. Additionally, there is abnormal symmetric restricted diffusion and FLAIR  signal abnormality involving the globus pallidi bilaterally (series 5, image 75). Appearance is most typical of a superimposed hypoxic ischemic injury versus toxic metabolic derangement. Additionally, there is abnormal restricted diffusion involving the mesial left temporal lobe with involvement of the left hippocampus (series 5, image 70). While this finding may be ischemic in nature, superimposed changes of seizure could also be considered. No mass lesion, significant mass effect, or midline shift. No hydrocephalus or extra-axial fluid collection. Pituitary gland and suprasellar region normal. Midline structures intact. Vascular: Major intracranial vascular flow voids are maintained. Skull and upper cervical spine: Craniocervical junction within normal limits. Signal intensity within the visualized bone marrow of the upper cervical spine is diffusely heterogeneous and decreased on T1 weighted imaging, nonspecific, but most commonly related to anemia, smoking, or obesity. No focal marrow replacing lesion. No scalp soft tissue abnormality. Sinuses/Orbits: Globes and orbital soft tissues demonstrate no acute finding. Scattered mucosal thickening noted throughout the paranasal sinuses. No significant mastoid effusion. Inner ear structures  grossly normal. Other: None. IMPRESSION: 1. 2.6 area of acute to early subacute ischemic infarct involving the right occipital pole, corresponding with abnormality on prior CT. Associated mild petechial hemorrhage without frank hemorrhagic transformation or significant mass effect. 2. Multiple additional scattered acute to early subacute ischemic infarcts involving the cortical and subcortical aspect of both cerebral and cerebellar hemispheres. Overall, a central thromboembolic etiology is suspected given the various vascular distributions involved. 3. Additional abnormal symmetric restricted diffusion involving the globus pallidi bilaterally. Appearance is most characteristic of a  superimposed/concomitant hypoxic ischemic injury versus toxic metabolic derangement. 4. Restricted diffusion involving the mesial left temporal lobe with involvement of the left hippocampus. While this finding may be ischemic in nature as well, superimposed changes of seizure and/or limbic encephalitis could also be considered. Correlation with EEG recommended. 5. Underlying age-related cerebral atrophy with moderate chronic microvascular ischemic disease. Electronically Signed   By: Jeannine Boga M.D.   On: 11/16/2020 21:26   DG Chest Portable 1 View  Result Date: 11/16/2020 CLINICAL DATA:  Altered mental status EXAM: PORTABLE CHEST 1 VIEW COMPARISON:  08/24/2020 FINDINGS: Cardiac shadow is within normal limits. Tortuous thoracic aorta is noted accentuated by patient rotation. The lungs are clear bilaterally. Old rib fractures on the left are seen and stable. No new focal abnormality is noted. IMPRESSION: No acute abnormality seen. Electronically Signed   By: Inez Catalina M.D.   On: 11/16/2020 12:00   EEG adult  Result Date: 11/17/2020 Derek Jack, MD     11/17/2020  8:00 AM Routine EEG Report Mackenzy Wofford Stratton is a 65 y.o. male with a history of acute encephalopathy who is undergoing an EEG to evaluate for seizures. Report: This EEG was acquired with electrodes placed according to the International 10-20 electrode system (including Fp1, Fp2, F3, F4, C3, C4, P3, P4, O1, O2, T3, T4, T5, T6, A1, A2, Fz, Cz, Pz). The following electrodes were missing or displaced: none. The best background activity was 5-6 Hz with intermittent delta slowing. This activity is reactive to stimulation. Sleep was characterized by vertex waves, sleep spindles (12 to 14 Hz). There was no focal slowing. There were no interictal epileptiform discharges. There were no electrographic seizures identified. Photic stimulation and hyperventilation were not performed. Impression and clinical correlation: This EEG was obtained  while obtunded and is abnormal due to moderate diffuse slowing suggestive of general cerebral dysfunction. Su Monks, MD [facility] [phone] If 7pm- 7am, please page neurology on call as listed in [place].     VAS [place] LOWER EXTREMITY VENOUS (DVT)  Result Date: 11/18/2020  Lower Venous DVT Study Indications: Stroke.  Comparison Study: no prior Performing Technologist: Abram Sander RVS  Examination Guidelines: A complete evaluation includes B-mode imaging, spectral Doppler, color Doppler, and power Doppler as needed of all accessible portions of each vessel. Bilateral testing is considered an integral part of a complete examination. Limited examinations for reoccurring indications may be performed as noted. The reflux portion of the exam is performed with the patient in reverse Trendelenburg.  +---------+---------------+---------+-----------+----------+--------------+ RIGHT    CompressibilityPhasicitySpontaneityPropertiesThrombus Aging +---------+---------------+---------+-----------+----------+--------------+ CFV      Full           Yes      Yes                                 +---------+---------------+---------+-----------+----------+--------------+ SFJ      Full                                                        +---------+---------------+---------+-----------+----------+--------------+  FV Prox  Full                                                        +---------+---------------+---------+-----------+----------+--------------+ FV Mid   Full                                                        +---------+---------------+---------+-----------+----------+--------------+ FV DistalFull                                                        +---------+---------------+---------+-----------+----------+--------------+ PFV      Full                                                         +---------+---------------+---------+-----------+----------+--------------+ POP      Full           Yes      Yes                                 +---------+---------------+---------+-----------+----------+--------------+ PTV      Full                                                        +---------+---------------+---------+-----------+----------+--------------+ PERO     Full                                                        +---------+---------------+---------+-----------+----------+--------------+   +---------+---------------+---------+-----------+----------+--------------+ LEFT     CompressibilityPhasicitySpontaneityPropertiesThrombus Aging +---------+---------------+---------+-----------+----------+--------------+ CFV      Full           Yes      Yes                                 +---------+---------------+---------+-----------+----------+--------------+ SFJ      Full                                                        +---------+---------------+---------+-----------+----------+--------------+ FV Prox  Full                                                        +---------+---------------+---------+-----------+----------+--------------+  FV Mid   Full                                                        +---------+---------------+---------+-----------+----------+--------------+ FV DistalFull                                                        +---------+---------------+---------+-----------+----------+--------------+ PFV      Full                                                        +---------+---------------+---------+-----------+----------+--------------+ POP      Full           Yes      Yes                                 +---------+---------------+---------+-----------+----------+--------------+ PTV      Full                                                         +---------+---------------+---------+-----------+----------+--------------+ PERO     Full                                                        +---------+---------------+---------+-----------+----------+--------------+     Summary: BILATERAL: - No evidence of deep vein thrombosis seen in the lower extremities, bilaterally. - No evidence of superficial venous thrombosis in the lower extremities, bilaterally. -No evidence of popliteal cyst, bilaterally.   *See table(s) above for measurements and observations.    Preliminary    ECHOCARDIOGRAM LIMITED  Result Date: 11/17/2020    ECHOCARDIOGRAM LIMITED REPORT   Patient Name:   DAMONTE FRIESON Rezek Date of Exam: 11/17/2020 Medical Rec #:  [id number]          Height:       67.0 in Accession #:    [id number]         Weight:       134.0 lb Date of Birth:  11/04/1955          BSA:          1.706 m Patient Age:    63 years           BP:           163/89 mmHg Patient Gender: M                  HR:           74 bpm. Exam Location:  Inpatient Procedure: Limited Echo, Limited Color Doppler and Cardiac Doppler Indications:    Elevated Troponin  History:  Patient has no prior history of Echocardiogram examinations.                 Risk Factors:Hypertension and ETOH.  Sonographer:    Bernadene Person RDCS Referring Phys: [id number] [place]  1. Left ventricular ejection fraction, by estimation, is 45 to 50%. The left ventricle has mildly decreased function. The left ventricle demonstrates regional wall motion abnormalities (see scoring diagram/findings for description). There is hypokinesis  of the anterior and anteroseptal LV wall segments. There is moderate concentric left ventricular hypertrophy. Left ventricular diastolic parameters are consistent with Grade I diastolic dysfunction (impaired relaxation).  2. Right ventricular systolic function is normal. The right ventricular size is normal. There is normal pulmonary artery systolic pressure.  3.  Moderate pleural effusion in the left lateral region.  4. The mitral valve is normal in structure. Trivial mitral valve regurgitation.  5. The aortic valve is tricuspid. There is mild calcification of the aortic valve. There is mild thickening of the aortic valve. Aortic valve regurgitation is mild. Mild aortic valve sclerosis is present, with no evidence of aortic valve stenosis.  6. The inferior vena cava is normal in size with greater than 50% respiratory variability, suggesting right atrial pressure of 3 mmHg.  7. There is mildly reduced LVEF with anterior and anteroseptal wall motion abnormalities concerning for possible LAD lesion. . Comparison(s): No prior Echocardiogram. FINDINGS  Left Ventricle: Left ventricular ejection fraction, by estimation, is 45 to 50%. The left ventricle has mildly decreased function. The left ventricle demonstrates regional wall motion abnormalities. There is hypokinesis of the anterior and anteroseptal LV wall segments. The left ventricular internal cavity size was normal in size. There is moderate concentric left ventricular hypertrophy. Left ventricular diastolic parameters are consistent with Grade I diastolic dysfunction (impaired relaxation). Normal left ventricular filling pressure. Right Ventricle: The right ventricular size is normal. No increase in right ventricular wall thickness. Right ventricular systolic function is normal. There is normal pulmonary artery systolic pressure. The tricuspid regurgitant velocity is 2.17 m/s, and  with an assumed right atrial pressure of 3 mmHg, the estimated right ventricular systolic pressure is 62.8 mmHg. Pericardium: There is no evidence of pericardial effusion. Mitral Valve: The mitral valve is normal in structure. There is mild thickening of the mitral valve leaflet(s). Mild mitral annular calcification. Trivial mitral valve regurgitation. Tricuspid Valve: The tricuspid valve is normal in structure. Tricuspid valve regurgitation is  trivial. Aortic Valve: The aortic valve is tricuspid. There is mild calcification of the aortic valve. There is mild thickening of the aortic valve. Aortic valve regurgitation is mild. Aortic regurgitation PHT measures 569 msec. Mild aortic valve sclerosis is present, with no evidence of aortic valve stenosis. Pulmonic Valve: The pulmonic valve was not well visualized. Pulmonic valve regurgitation is not visualized. Venous: The inferior vena cava is normal in size with greater than 50% respiratory variability, suggesting right atrial pressure of 3 mmHg. Additional Comments: There is a moderate pleural effusion in the left lateral region. LEFT VENTRICLE PLAX 2D LVIDd:         4.40 cm     Diastology LVIDs:         3.40 cm     LV e' medial:    7.76 cm/s LV PW:         1.30 cm     LV E/e' medial:  6.4 LV IVS:        1.10 cm     LV e' lateral:   10.00  cm/s LVOT diam:     2.20 cm     LV E/e' lateral: 4.9 LV SV:         63 LV SV Index:   37 LVOT Area:     3.80 cm  LV Volumes (MOD) LV vol d, MOD A2C: 64.6 ml LV vol d, MOD A4C: 71.3 ml LV vol s, MOD A2C: 31.4 ml LV vol s, MOD A4C: 36.3 ml LV SV MOD A2C:     33.2 ml LV SV MOD A4C:     71.3 ml LV SV MOD BP:      33.7 ml RIGHT VENTRICLE RV S prime:     11.70 cm/s TAPSE (M-mode): 2.1 cm LEFT ATRIUM         Index LA diam:    2.30 cm 1.35 cm/m  AORTIC VALVE LVOT Vmax:   90.10 cm/s LVOT Vmean:  54.100 cm/s LVOT VTI:    0.167 m AI PHT:      569 msec  AORTA Ao Root diam: 3.20 cm MITRAL VALVE               TRICUSPID VALVE MV Area (PHT): 3.27 cm    TR Peak grad:   18.8 mmHg MV Decel Time: 232 msec    TR Vmax:        217.00 cm/s MV E velocity: 49.30 cm/s MV A velocity: 53.60 cm/s  SHUNTS MV E/A ratio:  0.92        Systemic VTI:  0.17 m                            Systemic Diam: 2.20 cm Gwyndolyn Kaufman MD Electronically signed by Gwyndolyn Kaufman MD Signature Date/Time: 11/17/2020/10:24:55 AM    Final     Cardiac Studies   Echocardiogram 11/17/2020: Impressions: 1. Left  ventricular ejection fraction, by estimation, is 45 to 50%. The  left ventricle has mildly decreased function. The left ventricle  demonstrates regional wall motion abnormalities (see scoring  diagram/findings for description). There is hypokinesis  of the anterior and anteroseptal LV wall segments. There is moderate  concentric left ventricular hypertrophy. Left ventricular diastolic  parameters are consistent with Grade I diastolic dysfunction (impaired  relaxation).  2. Right ventricular systolic function is normal. The right ventricular  size is normal. There is normal pulmonary artery systolic pressure.  3. Moderate pleural effusion in the left lateral region.  4. The mitral valve is normal in structure. Trivial mitral valve  regurgitation.  5. The aortic valve is tricuspid. There is mild calcification of the  aortic valve. There is mild thickening of the aortic valve. Aortic valve  regurgitation is mild. Mild aortic valve sclerosis is present, with no  evidence of aortic valve stenosis.  6. The inferior vena cava is normal in size with greater than 50%  respiratory variability, suggesting right atrial pressure of 3 mmHg.  7. There is mildly reduced LVEF with anterior and anteroseptal wall  motion abnormalities concerning for possible LAD lesion.   Comparison(s): No prior Echocardiogram.   Patient Profile     65 y.o. 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 MRI showed extensive acute cerebrovascular ischemia. He underwent lumbar puncture and ID was  consulted for concern for possible HSV encephalitis. Cardiology consulted for further evaluation of abnormal Echo.  Assessment & Plan    New Cardiomyopathy with Mildly Reduced EF -  Chest x-ray showed no acute findings. - Echo showed LVEF of 45-505 with hypokinesis of the anterior and anteroseptal LV wall segments as well as moderate LVH and grade 1 diastolic dysfunction. Also showed mild AI, trivial MR, and moderate left pleural effusion. Wall motion abnormalities concerning for possible LAD territory ischemia. No prior study for comparison. - EKG showed chronic ST depression/T wave inversions in inferolateral leads. - High-sensitivity troponin 149 >> 127. Not consistent with ACS.  - Appears euvolemic. - When Neurology is OK with initiating antihypertensive, would consider add GDMT. UDS was positive for cocaine but could consider Coreg. Could also add Losartan when renal function returns to baseline. - Continue to monitor volume status closely.  - No plans for ischemic evaluation at this time. Could consider ischemic evaluation in the future if patient makes a meaningful recovery.   NSVT - One 5 beat run of NSVT noted on telemetry. - Potassium 5.9 on admission and 3.8 today. - Magnesium 2.6 yesterday.  - Consider adding Coreg when OK with Neurology (currently allowing for permissive hypertension in setting of acute CVA). Patient's UDS was positive for cocaine but Coreg has alpha blocking properties and risk for coronary vasospasm should be low.    Hypertension - No formal diagnosis prior to admission though BP has been significantly elevated on several recent ED/outpatient visit. Not on any medication home. - Currently allowing for permissive hypertension. When this is up, would recommended adding GDMT for CHF.  AKI - Creatinine peaked at 5.2 on admission. Improving with fluids and down to 1.39 today. Baseline around 1.0 to 1.2. - Primary team managing.  Otherwise, per primary team: - Acute encephalopathy in setting of acute CVA - Rhabdomyolysis  - AIDS - Polysubstance abuse: UDS positive for cocaine this admission.  For questions or updates, please contact [place] Please consult [email or web address] for contact info under      Signed, Darreld Mclean, PA-C  11/18/2020, 11:45 AM     Patient seen and examined.  Agree with above documentation.  On exam, patient follows some commands but not answering questions, regular rate and rhythm, no murmurs, lungs CTAB, no LE edema or JVD.  Recommend starting GDMT when able.  No further cardiac work-up recommended at this time, can plan for outpatient ischemic evaluation once recovers from acute illness.  Donato Heinz, MD

## 2020-11-18 NOTE — Progress Notes (Addendum)
STROKE TEAM PROGRESS NOTE   INTERVAL HISTORY No acute events overnight.  Patient was admitted 4/12 after being found down at home with hypoxia to 70s. He has remained obtunded since admission.  MRI showed extensive acute cerebrovascular ischemia with abnl restricted diffusion involving L mesial temporal lobe incl hippocampus ddx incl ischemic +/- superimposed changes of seizure.  ID, cardiology and medicine teams are managing patient.   Today patient's mental status appears to be improving as he is alert with eyes open, attempting to answer some questions and following simple commands. +dysarthric speech with hypophonia.   I explained his stroke diagnosis and plan of care in simple terms. Unclear how much he was able to comprehend. He did focus on me and appear to be attentive.   Vitals:   11/18/20 0338 11/18/20 0650 11/18/20 0751 11/18/20 1219  BP: (!) 169/61  (!) 165/87 (!) 172/92  Pulse: 77  70 77  Resp: 18  20 17   Temp: 98 F (36.7 C)  98 F (36.7 C) 98.3 F (36.8 C)  TempSrc: Axillary  Axillary Axillary  SpO2: 93%  93% 92%  Weight:  61.7 kg    Height:       CBC:  Recent Labs  Lab 11/17/20 0622 11/18/20 0112  WBC 14.1* 14.2*  NEUTROABS 12.2* 11.6*  HGB 14.9 14.0  HCT 45.7 43.5  MCV 92.0 92.4  PLT 244 268   Basic Metabolic Panel:  Recent Labs  Lab 11/17/20 0134 11/18/20 0112  NA 146* 150*  K 4.2 3.8  CL 114* 119*  CO2 18* 20*  GLUCOSE 82 93  BUN 76* 49*  CREATININE 2.45* 1.39*  CALCIUM 8.5* 8.3*  MG 2.6*  --    Lipid Panel:  Recent Labs  Lab 11/18/20 0112  CHOL 123  TRIG 115  HDL 53  CHOLHDL 2.3  VLDL 23  LDLCALC 47   HgbA1c:  Recent Labs  Lab 11/18/20 0112  HGBA1C 5.5   Urine Drug Screen:  Recent Labs  Lab 11/16/20 1700  LABOPIA NONE DETECTED  COCAINSCRNUR POSITIVE*  LABBENZ NONE DETECTED  AMPHETMU NONE DETECTED  THCU NONE DETECTED  LABBARB NONE DETECTED    Alcohol Level  Recent Labs  Lab 11/16/20 1133  ETH <10    Imaging/Pertinent diagnostic studies:  MRI 1. 2.6 area of acute to early subacute ischemic infarct involving the right occipital pole, corresponding with abnormality on prior CT. Associated mild petechial hemorrhage without frank hemorrhagic transformation or significant mass effect. 2. Multiple additional scattered acute to early subacute ischemic infarcts involving the cortical and subcortical aspect of both cerebral and cerebellar hemispheres. Overall, a central thromboembolic etiology is suspected given the various vascular distributions involved. 3. Additional abnormal symmetric restricted diffusion involving the globus pallidi bilaterally. Appearance is most characteristic of a superimposed/concomitant hypoxic ischemic injury versus toxic metabolic derangement. 4. Restricted diffusion involving the mesial left temporal lobe with involvement of the left hippocampus. While this finding may be ischemic in nature as well, superimposed changes of seizure and/or limbic encephalitis could also be considered. Correlation with EEG recommended. 5. Underlying age-related cerebral atrophy with moderate chronic microvascular ischemic disease.  CT head Rounded focus of hypoattenuation in the posterior right parietal lobe may be due to an infarct but cannot be definitively characterized. Brain MRI with and without contrast is recommended for further evaluation. Chronic microvascular ischemic change.  EEG: This study is suggestive ofmild tomoderatediffuse encephalopathy, nonspecific etiology.No seizures or epileptiform discharges were seen throughout the recording.  2D Echo  Echocardiogram  11/17/2020: Impressions: 1. Left ventricular ejection fraction, by estimation, is 45 to 50%. The  left ventricle has mildly decreased function. The left ventricle demonstrates regional wall motion abnormalities (see scoring diagram/findings for description). There is hypokinesis of the anterior and  anteroseptal LV wall segments. There is moderate concentric left ventricular hypertrophy. Left ventricular diastolic parameters are consistent with Grade I diastolic dysfunction (impaired  relaxation).  2. Right ventricular systolic function is normal. The right ventricular size is normal. There is normal pulmonary artery systolic pressure.  3. Moderate pleural effusion in the left lateral region.  4. The mitral valve is normal in structure. Trivial mitral valve regurgitation.  5. The aortic valve is tricuspid. There is mild calcification of the aortic valve. There is mild thickening of the aortic valve. Aortic valve regurgitation is mild. Mild aortic valve sclerosis is present, with no evidence of aortic valve stenosis.  6. The inferior vena cava is normal in size with greater than 50% respiratory variability, suggesting right atrial pressure of 3 mmHg.  7. There is mildly reduced LVEF with anterior and anteroseptal wall motion abnormalities concerning for possible LAD lesion.   Bilat LE doppler BILATERAL:  - No evidence of deep vein thrombosis seen in the lower extremities,  bilaterally.  - No evidence of superficial venous thrombosis in the lower extremities,  bilaterally.  -No evidence of popliteal cyst, bilaterally  PHYSICAL EXAM Gen: Resting in bed calmly with eyes open HEENT: Atraumatic, normocephalic;mucous membranes moist; oropharynx clear, tongue without atrophy or fasciculations. Resp: no extra work of breathing  CV: RRR, Skin: warm and dry with bullous lesions bilat hands and knees   Neuro: MS: Alert. Follows simple but not complex commands unreliably. Verbal response is dysarthric and hypophonic, mostly comprehensible. States name. Does not respond to date or place questions. Able to repeat 5 word sentence and name 3/3.  CN: PERRL 11mm, EOMI intact with much coaching.  Motor: Moving bilateral upper extremities at least antigravity. Does not follow commands for formal  strength testing. Does not lift BLE off bed to command but does dorsiflex 5/5 bilat.  Sensory: Does not answer questions for sensory testing Coordination, gait: UTA 2/2 encephalopathy  ASSESSMENT/PLAN [name] is a 65 y.o. 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 Leukocytosis 18.4 in setting of AIDS (CD4 197) with unknown source. 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 Multiple additional scattered acute to early subacute ischemic infarcts involving the cortical and subcortical aspect of both cerebral and cerebellar hemispheres. Embolic source is suspected with work up ongoing.    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 Antibiotics stopped as meningitis not apparent.   Lumbar puncture done this am, result is pending  Metabolic derangements: rhabdomyolysis, AKI, hyperkalemia, hypernatremia   S/p hypoxic event   Concern for seizure but no seizure seen on EEG as of yet. EEG showing mild tomoderatediffuse encephalopathy, nonspecific etiology.No seizures or  epileptiform discharges were seen throughout the recording.  Hypertension  New dx  Cardiology on board  . Permissive hypertension (OK if < 220/120) but gradually normalize in 5-7 days . Long-term BP goal normotensive  Hyperlipidemia  Home meds:   None  LDL 47, at goal < 70  High intensity statin not warranted in the setting of LDL at goal.   Glucose management  HgbA1c 5.5, at goal < 7.0  CBG monitoring may be warranted in setting of NPO status in encephalopathic state   New Cardiomyopathy with Mildly Reduced EF, NSVT:  EF 45-50%  Management per cardiology team  Other Stroke Risk Factors  Advanced Age >/= 69   Current Cigarette smoker, will be advised to stop smoking  Current ETOH use, alcohol level <10, will be advised to drink no more than 2 drink(s) a day  Substance abuse - UDS:  THC NONE DETECTED, Cocaine POSITIVE. Patient advised to stop using due to stroke risk.  Obesity, Body mass index is 21.3 kg/m., BMI >/= 30 associated with increased stroke risk, recommend weight loss, diet and exercise as appropriate   Congestive heart failure  Other Active Problems Bullous lesions: unclear etiology AKI: Creatinine peaked at 5.2 on admission. Acute encephalopathy in setting of acute CVA Rhabdomyolysis  AIDS: CD4 count of 197 Polysubstance abuse: UDS positive for cocaine this admission.   Hospital day # 2 This plan of care was directed by Dr. Stefanie Libel, NP-C  ATTENDING NOTE: I reviewed above note and agree with the assessment and plan. Pt was seen and examined.   65 year old male with history of AIDS, polysubstance abuse, alcohol abuse, hepatitis C, colon cancer status surgery and radiation admitted for altered mental status.  MRI showed embolic shower including bilateral cerebellum, bilateral ACAs and MCA/ACAs, right occipital lobe and left mesial temporal lobe.  MRA head and neck pending.  LE venous Doppler negative for DVT.  EF 45 to 50% with hypokinesis of anterior and anteroseptal LV wall segments.  EEG moderate diffuse slowing suggestive general cerebral dysfunction, no seizure.  CD4 197.  UDS positive for cocaine.  RPR negative.  LDL 47, A1c 5.5.  CSF WBC 1, RBC 1, glucose 66, protein 50.  On  exam, patient lethargic with flat affect, awake, alert, eyes open, not answer orientation questions. almost all language outpt except saying "nice day" when I asked him to repeat "today is a nice day". Did not name objects. Following all simple commands but with psychomotor slowing. No gaze palsy, tracking bilaterally, blinking to visual threat bilaterally. No facial droop, poor denture, severe dysarthria. Tongue midline. LUE 4/5 no drift but RUE 3/5 with drift. Bilaterally LEs withdraw to pain 2/5, but wiggle toes bilaterally symmetrically. Sensation, coordination not cooperative and gait not tested.  Etiology for patient's stroke unclear, concerning for endocarditis vs. global hypoxic ischemic injury.  TEE planned for tomorrow.  Less likely infectious vasculitis given clear CSF.  Okay to continue acyclovir per ID, however low suspicious for HSV encephalitis at this time given CSF findings.  Cardiology on board for mild cardiomyopathy, okay to start ARBs since pt is out of permissive hypertension window. Avoid low BP though. Currently on ASA 300 PR, did not pass swallow, likely need cortrak in am for TF. Will follow.  Rosalin Hawking, MD PhD Stroke Neurology 11/18/2020 7:05 PM  I spent  35 minutes in total face-to-face time with the patient, more than 50% of which was spent in counseling and coordination of care, reviewing test results, images  and medication, and discussing the diagnosis, treatment plan and potential prognosis. This patient's care requires review of multiple databases, neurological assessment, discussion with other specialists and medical decision making of high complexity.     To contact Stroke Continuity provider, please refer to [email or web address] After hours, contact General Neurology

## 2020-11-18 NOTE — H&P (View-Only) (Signed)
Progress Note  Patient Name: [name] Date of Encounter: 11/18/2020  [facility] Cardiologist: Donato Heinz, MD   Subjective   No acute overnight events. Patient alert and oriented to name and birthday today but could not tell me where he is. Difficult to understand his speech. Appears to be breathing comfortably. No chest pain.  Inpatient Medications    Scheduled Meds: . aspirin  81 mg Oral Daily   Or  . aspirin  300 mg Rectal Daily  . Chlorhexidine Gluconate Cloth  6 each Topical Daily  . sodium chloride flush  3 mL Intravenous Q12H   Continuous Infusions: . acyclovir    . dextrose 100 mL/hr at 11/18/20 1109  . thiamine injection 500 mg (11/18/20 1057)   PRN Meds: acetaminophen **OR** acetaminophen   Vital Signs    Vitals:   11/17/20 2350 11/18/20 0338 11/18/20 0650 11/18/20 0751  BP: (!) 159/87 (!) 169/61  (!) 165/87  Pulse: 82 77  70  Resp: 20 18  20   Temp: 97.6 F (36.4 C) 98 F (36.7 C)  98 F (36.7 C)  TempSrc: Axillary Axillary  Axillary  SpO2: 96% 93%  93%  Weight:   61.7 kg   Height:        Intake/Output Summary (Last 24 hours) at 11/18/2020 1145 Last data filed at 11/18/2020 0929 Gross per 24 hour  Intake 10 ml  Output 2100 ml  Net -2090 ml   Last 3 Weights 11/18/2020 11/16/2020 10/22/2020  Weight (lbs) 136 lb 0.4 oz 134 lb 0.6 oz 134 lb  Weight (kg) 61.7 kg 60.8 kg 60.782 kg  Some encounter information is confidential and restricted. Go to Review Flowsheets activity to see all data.      Telemetry    Normal sinus rhythm with rates in the 70's to 80's. One brief run of NSVT of 5 beats. - Personally Reviewed  ECG    No new ECG tracing today. - Personally Reviewed  Physical Exam   GEN: No acute distress.   Neck: No JVD. Cardiac: RRR. No murmurs, rubs, or gallops.  Radial pulses 2+ and equal bilaterally. Respiratory: Clear to auscultation bilaterally. No wheezes, rhonchi, or rales. GI: Soft, non-distended, and  non-tender. MS: No lower extremity edema. No deformity. Skin: Warm and dry. Neuro:  Alert but only oriented to name and birthday. Speech difficult to understand. Would not response to all commands. Psych: Flat affect.  Labs    High Sensitivity Troponin:   Recent Labs  Lab 11/16/20 1845 11/16/20 2254  TROPONINIHS 149* 127*      Chemistry Recent Labs  Lab 11/16/20 1135 11/16/20 1826 11/17/20 0134 11/18/20 0112  NA 141 143 146* 150*  K 5.9* 4.6 4.2 3.8  CL 105 109 114* 119*  CO2 19* 17* 18* 20*  GLUCOSE 105* 91 82 93  BUN 95* 86* 76* 49*  CREATININE 5.21* 3.32* 2.45* 1.39*  CALCIUM 8.9 8.5* 8.5* 8.3*  PROT 8.6*  --  6.5 6.1*  ALBUMIN 3.3*  --  2.4* 2.0*  AST 254*  --  213* 141*  ALT 113*  --  94* 74*  ALKPHOS 75  --  51 48  BILITOT 1.1  --  1.1 0.7  GFRNONAA 11* 20* 28* 56*  ANIONGAP 17* 17* 14 11     Hematology Recent Labs  Lab 11/16/20 1135 11/17/20 0622 11/18/20 0112  WBC 18.7* 14.1* 14.2*  RBC 5.35 4.97 4.71  HGB 15.8 14.9 14.0  HCT 49.4 45.7 43.5  MCV 92.3 92.0 92.4  MCH 29.5 30.0 29.7  MCHC 32.0 32.6 32.2  RDW 15.4 15.0 14.7  PLT 293 244 230    BNPNo results for input(s): BNP, PROBNP in the last 168 hours.   DDimer No results for input(s): DDIMER in the last 168 hours.   Radiology    CT Head Wo Contrast  Result Date: 11/16/2020 CLINICAL DATA:  Altered mental status. History of anorectal carcinoma. EXAM: CT HEAD WITHOUT CONTRAST TECHNIQUE: Contiguous axial images were obtained from the base of the skull through the vertex without intravenous contrast. COMPARISON:  Head CT 10/19/2008. FINDINGS: Brain: There is a rounded focus of hypoattenuation in the posterior right parietal lobe measuring 2.1 by 1.7 by 1.3 cm. Scattered hypoattenuation in the subcortical and periventricular deep white matter is noted. Remote lacunar infarctions in the basal ganglia are seen. No hemorrhage, hydrocephalus or midline shift. Vascular: Atherosclerosis noted. Skull:  Intact.  No focal lesion. Sinuses/Orbits: Negative. Other: None. IMPRESSION: Rounded focus of hypoattenuation in the posterior right parietal lobe may be due to an infarct but cannot be definitively characterized. Brain MRI with and without contrast is recommended for further evaluation. Chronic microvascular ischemic change. Electronically Signed   By: Inge Rise M.D.   On: 11/16/2020 14:43   MR BRAIN WO CONTRAST  Result Date: 11/16/2020 CLINICAL DATA:  Initial evaluation for acute mental status change, unknown cause. EXAM: MRI HEAD WITHOUT CONTRAST TECHNIQUE: Multiplanar, multiecho pulse sequences of the brain and surrounding structures were obtained without intravenous contrast. COMPARISON:  Prior head CT from earlier the same day. FINDINGS: Brain: Diffuse prominence of the CSF containing spaces compatible with generalized cerebral atrophy. Patchy T2/FLAIR hyperintensity within the periventricular and deep white matter both cerebral hemispheres as well as the pons, consistent with chronic microvascular ischemic disease, moderate in nature. 2.6 area of restricted diffusion involving the cortical and subcortical aspect of the right occipital pole, consistent with acute to early subacute ischemic infarct (series 5, image 76). Finding corresponds with abnormality on prior CT. Associated mild petechial hemorrhage without frank hemorrhagic transformation or significant mass effect. Multiple additional scattered foci of restricted diffusion involving the cortical and subcortical aspect of the bilateral cerebral hemispheres are seen, also consistent with acute ischemic changes (series 5, images 91, 86, 79). Patchy involvement of the caudate and lentiform nuclei noted bilaterally. Few additional scattered punctate ischemic infarcts noted involving the bilateral cerebellar hemispheres. No associated hemorrhage or significant mass effect. Additionally, there is abnormal symmetric restricted diffusion and FLAIR  signal abnormality involving the globus pallidi bilaterally (series 5, image 75). Appearance is most typical of a superimposed hypoxic ischemic injury versus toxic metabolic derangement. Additionally, there is abnormal restricted diffusion involving the mesial left temporal lobe with involvement of the left hippocampus (series 5, image 70). While this finding may be ischemic in nature, superimposed changes of seizure could also be considered. No mass lesion, significant mass effect, or midline shift. No hydrocephalus or extra-axial fluid collection. Pituitary gland and suprasellar region normal. Midline structures intact. Vascular: Major intracranial vascular flow voids are maintained. Skull and upper cervical spine: Craniocervical junction within normal limits. Signal intensity within the visualized bone marrow of the upper cervical spine is diffusely heterogeneous and decreased on T1 weighted imaging, nonspecific, but most commonly related to anemia, smoking, or obesity. No focal marrow replacing lesion. No scalp soft tissue abnormality. Sinuses/Orbits: Globes and orbital soft tissues demonstrate no acute finding. Scattered mucosal thickening noted throughout the paranasal sinuses. No significant mastoid effusion. Inner ear structures  grossly normal. Other: None. IMPRESSION: 1. 2.6 area of acute to early subacute ischemic infarct involving the right occipital pole, corresponding with abnormality on prior CT. Associated mild petechial hemorrhage without frank hemorrhagic transformation or significant mass effect. 2. Multiple additional scattered acute to early subacute ischemic infarcts involving the cortical and subcortical aspect of both cerebral and cerebellar hemispheres. Overall, a central thromboembolic etiology is suspected given the various vascular distributions involved. 3. Additional abnormal symmetric restricted diffusion involving the globus pallidi bilaterally. Appearance is most characteristic of a  superimposed/concomitant hypoxic ischemic injury versus toxic metabolic derangement. 4. Restricted diffusion involving the mesial left temporal lobe with involvement of the left hippocampus. While this finding may be ischemic in nature as well, superimposed changes of seizure and/or limbic encephalitis could also be considered. Correlation with EEG recommended. 5. Underlying age-related cerebral atrophy with moderate chronic microvascular ischemic disease. Electronically Signed   By: Jeannine Boga M.D.   On: 11/16/2020 21:26   DG Chest Portable 1 View  Result Date: 11/16/2020 CLINICAL DATA:  Altered mental status EXAM: PORTABLE CHEST 1 VIEW COMPARISON:  08/24/2020 FINDINGS: Cardiac shadow is within normal limits. Tortuous thoracic aorta is noted accentuated by patient rotation. The lungs are clear bilaterally. Old rib fractures on the left are seen and stable. No new focal abnormality is noted. IMPRESSION: No acute abnormality seen. Electronically Signed   By: Inez Catalina M.D.   On: 11/16/2020 12:00   EEG adult  Result Date: 11/17/2020 Derek Jack, MD     11/17/2020  8:00 AM Routine EEG Report Jabier Daymond Cordts is a 65 y.o. male with a history of acute encephalopathy who is undergoing an EEG to evaluate for seizures. Report: This EEG was acquired with electrodes placed according to the International 10-20 electrode system (including Fp1, Fp2, F3, F4, C3, C4, P3, P4, O1, O2, T3, T4, T5, T6, A1, A2, Fz, Cz, Pz). The following electrodes were missing or displaced: none. The best background activity was 5-6 Hz with intermittent delta slowing. This activity is reactive to stimulation. Sleep was characterized by vertex waves, sleep spindles (12 to 14 Hz). There was no focal slowing. There were no interictal epileptiform discharges. There were no electrographic seizures identified. Photic stimulation and hyperventilation were not performed. Impression and clinical correlation: This EEG was obtained  while obtunded and is abnormal due to moderate diffuse slowing suggestive of general cerebral dysfunction. Su Monks, MD [facility] [phone] If 7pm- 7am, please page neurology on call as listed in [place].     VAS [place] LOWER EXTREMITY VENOUS (DVT)  Result Date: 11/18/2020  Lower Venous DVT Study Indications: Stroke.  Comparison Study: no prior Performing Technologist: Abram Sander RVS  Examination Guidelines: A complete evaluation includes B-mode imaging, spectral Doppler, color Doppler, and power Doppler as needed of all accessible portions of each vessel. Bilateral testing is considered an integral part of a complete examination. Limited examinations for reoccurring indications may be performed as noted. The reflux portion of the exam is performed with the patient in reverse Trendelenburg.  +---------+---------------+---------+-----------+----------+--------------+ RIGHT    CompressibilityPhasicitySpontaneityPropertiesThrombus Aging +---------+---------------+---------+-----------+----------+--------------+ CFV      Full           Yes      Yes                                 +---------+---------------+---------+-----------+----------+--------------+ SFJ      Full                                                        +---------+---------------+---------+-----------+----------+--------------+  FV Prox  Full                                                        +---------+---------------+---------+-----------+----------+--------------+ FV Mid   Full                                                        +---------+---------------+---------+-----------+----------+--------------+ FV DistalFull                                                        +---------+---------------+---------+-----------+----------+--------------+ PFV      Full                                                         +---------+---------------+---------+-----------+----------+--------------+ POP      Full           Yes      Yes                                 +---------+---------------+---------+-----------+----------+--------------+ PTV      Full                                                        +---------+---------------+---------+-----------+----------+--------------+ PERO     Full                                                        +---------+---------------+---------+-----------+----------+--------------+   +---------+---------------+---------+-----------+----------+--------------+ LEFT     CompressibilityPhasicitySpontaneityPropertiesThrombus Aging +---------+---------------+---------+-----------+----------+--------------+ CFV      Full           Yes      Yes                                 +---------+---------------+---------+-----------+----------+--------------+ SFJ      Full                                                        +---------+---------------+---------+-----------+----------+--------------+ FV Prox  Full                                                        +---------+---------------+---------+-----------+----------+--------------+  FV Mid   Full                                                        +---------+---------------+---------+-----------+----------+--------------+ FV DistalFull                                                        +---------+---------------+---------+-----------+----------+--------------+ PFV      Full                                                        +---------+---------------+---------+-----------+----------+--------------+ POP      Full           Yes      Yes                                 +---------+---------------+---------+-----------+----------+--------------+ PTV      Full                                                         +---------+---------------+---------+-----------+----------+--------------+ PERO     Full                                                        +---------+---------------+---------+-----------+----------+--------------+     Summary: BILATERAL: - No evidence of deep vein thrombosis seen in the lower extremities, bilaterally. - No evidence of superficial venous thrombosis in the lower extremities, bilaterally. -No evidence of popliteal cyst, bilaterally.   *See table(s) above for measurements and observations.    Preliminary    ECHOCARDIOGRAM LIMITED  Result Date: 11/17/2020    ECHOCARDIOGRAM LIMITED REPORT   Patient Name:   THURL BOEN Woo Date of Exam: 11/17/2020 Medical Rec #:  [id number]          Height:       67.0 in Accession #:    [id number]         Weight:       134.0 lb Date of Birth:  13-Oct-1955          BSA:          1.706 m Patient Age:    65 years           BP:           163/89 mmHg Patient Gender: M                  HR:           74 bpm. Exam Location:  Inpatient Procedure: Limited Echo, Limited Color Doppler and Cardiac Doppler Indications:    Elevated Troponin  History:  Patient has no prior history of Echocardiogram examinations.                 Risk Factors:Hypertension and ETOH.  Sonographer:    Bernadene Person RDCS Referring Phys: [id number] [place]  1. Left ventricular ejection fraction, by estimation, is 45 to 50%. The left ventricle has mildly decreased function. The left ventricle demonstrates regional wall motion abnormalities (see scoring diagram/findings for description). There is hypokinesis  of the anterior and anteroseptal LV wall segments. There is moderate concentric left ventricular hypertrophy. Left ventricular diastolic parameters are consistent with Grade I diastolic dysfunction (impaired relaxation).  2. Right ventricular systolic function is normal. The right ventricular size is normal. There is normal pulmonary artery systolic pressure.  3.  Moderate pleural effusion in the left lateral region.  4. The mitral valve is normal in structure. Trivial mitral valve regurgitation.  5. The aortic valve is tricuspid. There is mild calcification of the aortic valve. There is mild thickening of the aortic valve. Aortic valve regurgitation is mild. Mild aortic valve sclerosis is present, with no evidence of aortic valve stenosis.  6. The inferior vena cava is normal in size with greater than 50% respiratory variability, suggesting right atrial pressure of 3 mmHg.  7. There is mildly reduced LVEF with anterior and anteroseptal wall motion abnormalities concerning for possible LAD lesion. . Comparison(s): No prior Echocardiogram. FINDINGS  Left Ventricle: Left ventricular ejection fraction, by estimation, is 45 to 50%. The left ventricle has mildly decreased function. The left ventricle demonstrates regional wall motion abnormalities. There is hypokinesis of the anterior and anteroseptal LV wall segments. The left ventricular internal cavity size was normal in size. There is moderate concentric left ventricular hypertrophy. Left ventricular diastolic parameters are consistent with Grade I diastolic dysfunction (impaired relaxation). Normal left ventricular filling pressure. Right Ventricle: The right ventricular size is normal. No increase in right ventricular wall thickness. Right ventricular systolic function is normal. There is normal pulmonary artery systolic pressure. The tricuspid regurgitant velocity is 2.17 m/s, and  with an assumed right atrial pressure of 3 mmHg, the estimated right ventricular systolic pressure is 77.4 mmHg. Pericardium: There is no evidence of pericardial effusion. Mitral Valve: The mitral valve is normal in structure. There is mild thickening of the mitral valve leaflet(s). Mild mitral annular calcification. Trivial mitral valve regurgitation. Tricuspid Valve: The tricuspid valve is normal in structure. Tricuspid valve regurgitation is  trivial. Aortic Valve: The aortic valve is tricuspid. There is mild calcification of the aortic valve. There is mild thickening of the aortic valve. Aortic valve regurgitation is mild. Aortic regurgitation PHT measures 569 msec. Mild aortic valve sclerosis is present, with no evidence of aortic valve stenosis. Pulmonic Valve: The pulmonic valve was not well visualized. Pulmonic valve regurgitation is not visualized. Venous: The inferior vena cava is normal in size with greater than 50% respiratory variability, suggesting right atrial pressure of 3 mmHg. Additional Comments: There is a moderate pleural effusion in the left lateral region. LEFT VENTRICLE PLAX 2D LVIDd:         4.40 cm     Diastology LVIDs:         3.40 cm     LV e' medial:    7.76 cm/s LV PW:         1.30 cm     LV E/e' medial:  6.4 LV IVS:        1.10 cm     LV e' lateral:   10.00  cm/s LVOT diam:     2.20 cm     LV E/e' lateral: 4.9 LV SV:         63 LV SV Index:   37 LVOT Area:     3.80 cm  LV Volumes (MOD) LV vol d, MOD A2C: 64.6 ml LV vol d, MOD A4C: 71.3 ml LV vol s, MOD A2C: 31.4 ml LV vol s, MOD A4C: 36.3 ml LV SV MOD A2C:     33.2 ml LV SV MOD A4C:     71.3 ml LV SV MOD BP:      33.7 ml RIGHT VENTRICLE RV S prime:     11.70 cm/s TAPSE (M-mode): 2.1 cm LEFT ATRIUM         Index LA diam:    2.30 cm 1.35 cm/m  AORTIC VALVE LVOT Vmax:   90.10 cm/s LVOT Vmean:  54.100 cm/s LVOT VTI:    0.167 m AI PHT:      569 msec  AORTA Ao Root diam: 3.20 cm MITRAL VALVE               TRICUSPID VALVE MV Area (PHT): 3.27 cm    TR Peak grad:   18.8 mmHg MV Decel Time: 232 msec    TR Vmax:        217.00 cm/s MV E velocity: 49.30 cm/s MV A velocity: 53.60 cm/s  SHUNTS MV E/A ratio:  0.92        Systemic VTI:  0.17 m                            Systemic Diam: 2.20 cm Gwyndolyn Kaufman MD Electronically signed by Gwyndolyn Kaufman MD Signature Date/Time: 11/17/2020/10:24:55 AM    Final     Cardiac Studies   Echocardiogram 11/17/2020: Impressions: 1. Left  ventricular ejection fraction, by estimation, is 45 to 50%. The  left ventricle has mildly decreased function. The left ventricle  demonstrates regional wall motion abnormalities (see scoring  diagram/findings for description). There is hypokinesis  of the anterior and anteroseptal LV wall segments. There is moderate  concentric left ventricular hypertrophy. Left ventricular diastolic  parameters are consistent with Grade I diastolic dysfunction (impaired  relaxation).  2. Right ventricular systolic function is normal. The right ventricular  size is normal. There is normal pulmonary artery systolic pressure.  3. Moderate pleural effusion in the left lateral region.  4. The mitral valve is normal in structure. Trivial mitral valve  regurgitation.  5. The aortic valve is tricuspid. There is mild calcification of the  aortic valve. There is mild thickening of the aortic valve. Aortic valve  regurgitation is mild. Mild aortic valve sclerosis is present, with no  evidence of aortic valve stenosis.  6. The inferior vena cava is normal in size with greater than 50%  respiratory variability, suggesting right atrial pressure of 3 mmHg.  7. There is mildly reduced LVEF with anterior and anteroseptal wall  motion abnormalities concerning for possible LAD lesion.   Comparison(s): No prior Echocardiogram.   Patient Profile     65 y.o. 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 MRI showed extensive acute cerebrovascular ischemia. He underwent lumbar puncture and ID was  consulted for concern for possible HSV encephalitis. Cardiology consulted for further evaluation of abnormal Echo.  Assessment & Plan    New Cardiomyopathy with Mildly Reduced EF -  Chest x-ray showed no acute findings. - Echo showed LVEF of 45-505 with hypokinesis of the anterior and anteroseptal LV wall segments as well as moderate LVH and grade 1 diastolic dysfunction. Also showed mild AI, trivial MR, and moderate left pleural effusion. Wall motion abnormalities concerning for possible LAD territory ischemia. No prior study for comparison. - EKG showed chronic ST depression/T wave inversions in inferolateral leads. - High-sensitivity troponin 149 >> 127. Not consistent with ACS.  - Appears euvolemic. - When Neurology is OK with initiating antihypertensive, would consider add GDMT. UDS was positive for cocaine but could consider Coreg. Could also add Losartan when renal function returns to baseline. - Continue to monitor volume status closely.  - No plans for ischemic evaluation at this time. Could consider ischemic evaluation in the future if patient makes a meaningful recovery.   NSVT - One 5 beat run of NSVT noted on telemetry. - Potassium 5.9 on admission and 3.8 today. - Magnesium 2.6 yesterday.  - Consider adding Coreg when OK with Neurology (currently allowing for permissive hypertension in setting of acute CVA). Patient's UDS was positive for cocaine but Coreg has alpha blocking properties and risk for coronary vasospasm should be low.    Hypertension - No formal diagnosis prior to admission though BP has been significantly elevated on several recent ED/outpatient visit. Not on any medication home. - Currently allowing for permissive hypertension. When this is up, would recommended adding GDMT for CHF.  AKI - Creatinine peaked at 5.2 on admission. Improving with fluids and down to 1.39 today. Baseline around 1.0 to 1.2. - Primary team managing.  Otherwise, per primary team: - Acute encephalopathy in setting of acute CVA - Rhabdomyolysis  - AIDS - Polysubstance abuse: UDS positive for cocaine this admission.  For questions or updates, please contact [place] Please consult [email or web address] for contact info under      Signed, Darreld Mclean, PA-C  11/18/2020, 11:45 AM     Patient seen and examined.  Agree with above documentation.  On exam, patient follows some commands but not answering questions, regular rate and rhythm, no murmurs, lungs CTAB, no LE edema or JVD.  Recommend starting GDMT when able.  No further cardiac work-up recommended at this time, can plan for outpatient ischemic evaluation once recovers from acute illness.  Donato Heinz, MD

## 2020-11-18 NOTE — Progress Notes (Signed)
Subjective: [name] is still encephalopathic but appeared to recognize me today  Antibiotics:  Anti-infectives (From admission, onward)   Start     Dose/Rate Route Frequency Ordered Stop   11/18/20 2200  acyclovir (ZOVIRAX) 600 mg in dextrose 5 % 250 mL IVPB        600 mg 262 mL/hr over 60 Minutes Intravenous Every 12 hours 11/18/20 1139     11/18/20 1630  bictegravir-emtricitabine-tenofovir AF (BIKTARVY) 50-200-25 MG per tablet 1 tablet        1 tablet Oral Daily 11/18/20 1539     11/18/20 1000  cefTRIAXone (ROCEPHIN) 2 g in sodium chloride 0.9 % 100 mL IVPB  Status:  Discontinued        2 g 200 mL/hr over 30 Minutes Intravenous Every 12 hours 11/18/20 0651 11/18/20 0846   11/18/20 0928  acyclovir (ZOVIRAX) 600 mg in dextrose 5 % 250 mL IVPB  Status:  Discontinued        600 mg 262 mL/hr over 60 Minutes Intravenous Every 24 hours 11/17/20 1448 11/18/20 1139   11/17/20 1530  cefTRIAXone (ROCEPHIN) 2 g in sodium chloride 0.9 % 100 mL IVPB  Status:  Discontinued        2 g 200 mL/hr over 30 Minutes Intravenous Every 12 hours 11/17/20 1432 11/18/20 0651   11/17/20 1530  ampicillin (OMNIPEN) 2 g in sodium chloride 0.9 % 100 mL IVPB  Status:  Discontinued        2 g 300 mL/hr over 20 Minutes Intravenous Every 8 hours 11/17/20 1432 11/18/20 0651   11/17/20 1330  ceFEPIme (MAXIPIME) 2 g in sodium chloride 0.9 % 100 mL IVPB  Status:  Discontinued        2 g 200 mL/hr over 30 Minutes Intravenous Every 24 hours 11/16/20 1342 11/17/20 0647   11/17/20 1000  vancomycin (VANCOREADY) IVPB 750 mg/150 mL  Status:  Discontinued        750 mg 150 mL/hr over 60 Minutes Intravenous Every 24 hours 11/17/20 0654 11/17/20 0712   11/17/20 0800  ceFEPIme (MAXIPIME) 2 g in sodium chloride 0.9 % 100 mL IVPB  Status:  Discontinued        2 g 200 mL/hr over 30 Minutes Intravenous Every 24 hours 11/17/20 0647 11/17/20 1430   11/17/20 0745  vancomycin (VANCOREADY) IVPB 1000 mg/200 mL  Status:   Discontinued        1,000 mg 200 mL/hr over 60 Minutes Intravenous Every 8 hours 11/17/20 0647 11/17/20 0650   11/17/20 0745  vancomycin (VANCOREADY) IVPB 1250 mg/250 mL  Status:  Discontinued        1,250 mg 166.7 mL/hr over 90 Minutes Intravenous  Once 11/17/20 0654 11/17/20 0656   11/17/20 0730  acyclovir (ZOVIRAX) 600 mg in dextrose 5 % 250 mL IVPB  Status:  Discontinued        600 mg 262 mL/hr over 60 Minutes Intravenous Every 12 hours 11/17/20 0659 11/17/20 1448   11/17/20 0700  ampicillin (OMNIPEN) 2 g in sodium chloride 0.9 % 100 mL IVPB  Status:  Discontinued        2 g 300 mL/hr over 20 Minutes Intravenous Every 8 hours 11/17/20 0647 11/17/20 1430   11/17/20 0700  acyclovir (ZOVIRAX) 600 mg in dextrose 5 % 250 mL IVPB  Status:  Discontinued        600 mg 262 mL/hr over 60 Minutes Intravenous Every 24 hours 11/17/20 0647 11/17/20 0659  11/16/20 1345  vancomycin (VANCOREADY) IVPB 1250 mg/250 mL        1,250 mg 166.7 mL/hr over 90 Minutes Intravenous  Once 11/16/20 1331 11/16/20 1730   11/16/20 1335  vancomycin variable dose per unstable renal function (pharmacist dosing)  Status:  Discontinued         Does not apply See admin instructions 11/16/20 1335 11/16/20 2014   11/16/20 1330  ceFEPIme (MAXIPIME) 2 g in sodium chloride 0.9 % 100 mL IVPB        2 g 200 mL/hr over 30 Minutes Intravenous  Once 11/16/20 1322 11/16/20 1505   11/16/20 1330  metroNIDAZOLE (FLAGYL) IVPB 500 mg        500 mg 100 mL/hr over 60 Minutes Intravenous  Once 11/16/20 1322 11/16/20 1505   11/16/20 1330  vancomycin (VANCOREADY) IVPB 1000 mg/200 mL  Status:  Discontinued        1,000 mg 200 mL/hr over 60 Minutes Intravenous  Once 11/16/20 1322 11/16/20 1331      Medications: Scheduled Meds: . aspirin  81 mg Oral Daily   Or  . aspirin  300 mg Rectal Daily  . bictegravir-emtricitabine-tenofovir AF  1 tablet Oral Daily  . Chlorhexidine Gluconate Cloth  6 each Topical Daily  . sodium chloride flush  3  mL Intravenous Q12H   Continuous Infusions: . acyclovir    . dextrose 100 mL/hr at 11/18/20 1109  . thiamine injection 500 mg (11/18/20 1057)   PRN Meds:.acetaminophen **OR** acetaminophen    Objective: Weight change: 0.9 kg  Intake/Output Summary (Last 24 hours) at 11/18/2020 1541 Last data filed at 11/18/2020 0929 Gross per 24 hour  Intake 10 ml  Output 1200 ml  Net -1190 ml   Blood pressure (!) 172/92, pulse 77, temperature 98.3 F (36.8 C), temperature source Axillary, resp. rate 17, height 5\' 7"  (1.702 m), weight 61.7 kg, SpO2 92 %. Temp:  [97.6 F (36.4 C)-98.3 F (36.8 C)] 98.3 F (36.8 C) (04/14 1219) Pulse Rate:  [70-94] 77 (04/14 1219) Resp:  [17-20] 17 (04/14 1219) BP: (156-172)/(61-92) 172/92 (04/14 1219) SpO2:  [90 %-96 %] 92 % (04/14 1219) Weight:  [61.7 kg] 61.7 kg (04/14 0650)  Physical Exam: Physical Exam Constitutional:      Appearance: He is cachectic.  Eyes:     Extraocular Movements: Extraocular movements intact.  Cardiovascular:     Rate and Rhythm: Tachycardia present.  Pulmonary:     Effort: Pulmonary effort is normal. No respiratory distress.     Breath sounds: No wheezing.  Abdominal:     General: Abdomen is flat. There is no distension.  Neurological:     Mental Status: He is alert.     Comments: Oriented to self and that he is in the hospital  Psychiatric:        Speech: Speech is delayed.        Behavior: Behavior is cooperative.        Cognition and Memory: Cognition is impaired. Memory is impaired.      CBC:    BMET Recent Labs    11/17/20 0134 11/18/20 0112  NA 146* 150*  K 4.2 3.8  CL 114* 119*  CO2 18* 20*  GLUCOSE 82 93  BUN 76* 49*  CREATININE 2.45* 1.39*  CALCIUM 8.5* 8.3*     Liver Panel  Recent Labs    11/17/20 0134 11/18/20 0112  PROT 6.5 6.1*  ALBUMIN 2.4* 2.0*  AST 213* 141*  ALT 94* 74*  ALKPHOS  51 48  BILITOT 1.1 0.7       Sedimentation Rate No results for input(s): ESRSEDRATE in  the last 72 hours. C-Reactive Protein No results for input(s): CRP in the last 72 hours.  Micro Results: Recent Results (from the past 720 hour(s))  SARS CORONAVIRUS 2 (TAT 6-24 HRS) Nasopharyngeal Nasopharyngeal Swab     Status: None   Collection Time: 10/23/20  4:03 AM   Specimen: Nasopharyngeal Swab  Result Value Ref Range Status   SARS Coronavirus 2 NEGATIVE NEGATIVE Final    Comment: (NOTE) SARS-CoV-2 target nucleic acids are NOT DETECTED.  The SARS-CoV-2 RNA is generally detectable in upper and lower respiratory specimens during the acute phase of infection. Negative results do not preclude SARS-CoV-2 infection, do not rule out co-infections with other pathogens, and should not be used as the sole basis for treatment or other patient management decisions. Negative results must be combined with clinical observations, patient history, and epidemiological information. The expected result is Negative.  Fact Sheet for Patients: SugarRoll.be  Fact Sheet for Healthcare Providers: https://www.woods-mathews.com/  This test is not yet approved or cleared by the [place] FDA and  has been authorized for detection and/or diagnosis of SARS-CoV-2 by FDA under an Emergency Use Authorization (EUA). This EUA will remain  in effect (meaning this test can be used) for the duration of the COVID-19 declaration under Se ction 564(b)(1) of the Act, 21 U.S.C. section 360bbb-3(b)(1), unless the authorization is terminated or revoked sooner.  Performed at [facility], [place] [address] [place], [place] [address]   Resp Panel by RT-PCR (Flu A&B, Covid) Nasopharyngeal Swab     Status: None   Collection Time: 11/16/20 11:28 AM   Specimen: Nasopharyngeal Swab; Nasopharyngeal(NP) swabs in vial transport medium  Result Value Ref Range Status   SARS Coronavirus 2 by RT PCR NEGATIVE NEGATIVE Final    Comment: (NOTE) SARS-CoV-2 target nucleic acids  are NOT DETECTED.  The SARS-CoV-2 RNA is generally detectable in upper respiratory specimens during the acute phase of infection. The lowest concentration of SARS-CoV-2 viral copies this assay can detect is 138 copies/mL. A negative result does not preclude SARS-Cov-2 infection and should not be used as the sole basis for treatment or other patient management decisions. A negative result may occur with  improper specimen collection/handling, submission of specimen other than nasopharyngeal swab, presence of viral mutation(s) within the areas targeted by this assay, and inadequate number of viral copies(<138 copies/mL). A negative result must be combined with clinical observations, patient history, and epidemiological information. The expected result is Negative.  Fact Sheet for Patients:  EntrepreneurPulse.com.au  Fact Sheet for Healthcare Providers:  IncredibleEmployment.be  This test is no t yet approved or cleared by the [place] FDA and  has been authorized for detection and/or diagnosis of SARS-CoV-2 by FDA under an Emergency Use Authorization (EUA). This EUA will remain  in effect (meaning this test can be used) for the duration of the COVID-19 declaration under Section 564(b)(1) of the Act, 21 U.S.C.section 360bbb-3(b)(1), unless the authorization is terminated  or revoked sooner.       Influenza A by PCR NEGATIVE NEGATIVE Final   Influenza B by PCR NEGATIVE NEGATIVE Final    Comment: (NOTE) The Xpert Xpress SARS-CoV-2/FLU/RSV plus assay is intended as an aid in the diagnosis of influenza from Nasopharyngeal swab specimens and should not be used as a sole basis for treatment. Nasal washings and aspirates are unacceptable for Xpert Xpress SARS-CoV-2/FLU/RSV testing.  Fact Sheet for Patients: EntrepreneurPulse.com.au  Fact Sheet for Healthcare Providers: IncredibleEmployment.be  This test is  not yet approved or cleared by the [place] FDA and has been authorized for detection and/or diagnosis of SARS-CoV-2 by FDA under an Emergency Use Authorization (EUA). This EUA will remain in effect (meaning this test can be used) for the duration of the COVID-19 declaration under Section 564(b)(1) of the Act, 21 U.S.C. section 360bbb-3(b)(1), unless the authorization is terminated or revoked.  Performed at [facility], [place] [address], [place], [place] [address]   Culture, blood (routine x 2)     Status: None (Preliminary result)   Collection Time: 11/16/20 11:33 AM   Specimen: BLOOD  Result Value Ref Range Status   Specimen Description BLOOD SITE NOT SPECIFIED  Final   Special Requests   Final    BOTTLES DRAWN AEROBIC AND ANAEROBIC Blood Culture results may not be optimal due to an inadequate volume of blood received in culture bottles   Culture   Final    NO GROWTH 2 DAYS Performed at [facility], [place] [address] [place], [place] [address]    Report Status PENDING  Incomplete  Culture, blood (routine x 2)     Status: None (Preliminary result)   Collection Time: 11/16/20 11:33 AM   Specimen: BLOOD  Result Value Ref Range Status   Specimen Description BLOOD SITE NOT SPECIFIED  Final   Special Requests   Final    BOTTLES DRAWN AEROBIC AND ANAEROBIC Blood Culture results may not be optimal due to an inadequate volume of blood received in culture bottles   Culture   Final    NO GROWTH 2 DAYS Performed at [facility], [place] [address] [place], [place] [address]    Report Status PENDING  Incomplete  CSF culture w Stat Gram Stain     Status: None (Preliminary result)   Collection Time: 11/17/20  4:31 PM   Specimen: CSF; Cerebrospinal Fluid  Result Value Ref Range Status   Specimen Description CSF  Final   Special Requests NONE  Final   Gram Stain NO WBC SEEN NO ORGANISMS SEEN CYTOSPIN SMEAR   Final   Culture   Final    NO GROWTH < 24 HOURS Performed  at [facility], [place] [address], [place], [place] [address]    Report Status PENDING  Incomplete    Studies/Results: MR BRAIN WO CONTRAST  Result Date: 11/16/2020 CLINICAL DATA:  Initial evaluation for acute mental status change, unknown cause. EXAM: MRI HEAD WITHOUT CONTRAST TECHNIQUE: Multiplanar, multiecho pulse sequences of the brain and surrounding structures were obtained without intravenous contrast. COMPARISON:  Prior head CT from earlier the same day. FINDINGS: Brain: Diffuse prominence of the CSF containing spaces compatible with generalized cerebral atrophy. Patchy T2/FLAIR hyperintensity within the periventricular and deep white matter both cerebral hemispheres as well as the pons, consistent with chronic microvascular ischemic disease, moderate in nature. 2.6 area of restricted diffusion involving the cortical and subcortical aspect of the right occipital pole, consistent with acute to early subacute ischemic infarct (series 5, image 76). Finding corresponds with abnormality on prior CT. Associated mild petechial hemorrhage without frank hemorrhagic transformation or significant mass effect. Multiple additional scattered foci of restricted diffusion involving the cortical and subcortical aspect of the bilateral cerebral hemispheres are seen, also consistent with acute ischemic changes (series 5, images 91, 86, 79). Patchy involvement of the caudate and lentiform nuclei noted bilaterally. Few additional scattered  punctate ischemic infarcts noted involving the bilateral cerebellar hemispheres. No associated hemorrhage or significant mass effect. Additionally, there is abnormal symmetric restricted diffusion and FLAIR signal abnormality involving the globus pallidi bilaterally (series 5, image 75). Appearance is most typical of a superimposed hypoxic ischemic injury versus toxic metabolic derangement. Additionally, there is abnormal restricted diffusion involving the mesial left temporal lobe  with involvement of the left hippocampus (series 5, image 70). While this finding may be ischemic in nature, superimposed changes of seizure could also be considered. No mass lesion, significant mass effect, or midline shift. No hydrocephalus or extra-axial fluid collection. Pituitary gland and suprasellar region normal. Midline structures intact. Vascular: Major intracranial vascular flow voids are maintained. Skull and upper cervical spine: Craniocervical junction within normal limits. Signal intensity within the visualized bone marrow of the upper cervical spine is diffusely heterogeneous and decreased on T1 weighted imaging, nonspecific, but most commonly related to anemia, smoking, or obesity. No focal marrow replacing lesion. No scalp soft tissue abnormality. Sinuses/Orbits: Globes and orbital soft tissues demonstrate no acute finding. Scattered mucosal thickening noted throughout the paranasal sinuses. No significant mastoid effusion. Inner ear structures grossly normal. Other: None. IMPRESSION: 1. 2.6 area of acute to early subacute ischemic infarct involving the right occipital pole, corresponding with abnormality on prior CT. Associated mild petechial hemorrhage without frank hemorrhagic transformation or significant mass effect. 2. Multiple additional scattered acute to early subacute ischemic infarcts involving the cortical and subcortical aspect of both cerebral and cerebellar hemispheres. Overall, a central thromboembolic etiology is suspected given the various vascular distributions involved. 3. Additional abnormal symmetric restricted diffusion involving the globus pallidi bilaterally. Appearance is most characteristic of a superimposed/concomitant hypoxic ischemic injury versus toxic metabolic derangement. 4. Restricted diffusion involving the mesial left temporal lobe with involvement of the left hippocampus. While this finding may be ischemic in nature as well, superimposed changes of seizure  and/or limbic encephalitis could also be considered. Correlation with EEG recommended. 5. Underlying age-related cerebral atrophy with moderate chronic microvascular ischemic disease. Electronically Signed   By: Jeannine Boga M.D.   On: 11/16/2020 21:26   EEG adult  Result Date: 11/17/2020 Derek Jack, MD     11/17/2020  8:00 AM Routine EEG Report Torrey Adrienne Delay is a 65 y.o. male with a history of acute encephalopathy who is undergoing an EEG to evaluate for seizures. Report: This EEG was acquired with electrodes placed according to the International 10-20 electrode system (including Fp1, Fp2, F3, F4, C3, C4, P3, P4, O1, O2, T3, T4, T5, T6, A1, A2, Fz, Cz, Pz). The following electrodes were missing or displaced: none. The best background activity was 5-6 Hz with intermittent delta slowing. This activity is reactive to stimulation. Sleep was characterized by vertex waves, sleep spindles (12 to 14 Hz). There was no focal slowing. There were no interictal epileptiform discharges. There were no electrographic seizures identified. Photic stimulation and hyperventilation were not performed. Impression and clinical correlation: This EEG was obtained while obtunded and is abnormal due to moderate diffuse slowing suggestive of general cerebral dysfunction. Su Monks, MD [facility] [phone] If 7pm- 7am, please page neurology on call as listed in [place].     VAS [place] LOWER EXTREMITY VENOUS (DVT)  Result Date: 11/18/2020  Lower Venous DVT Study Indications: Stroke.  Comparison Study: no prior Performing Technologist: Abram Sander RVS  Examination Guidelines: A complete evaluation includes B-mode imaging, spectral Doppler, color Doppler, and power Doppler as needed of all accessible portions of each vessel.  Bilateral testing is considered an integral part of a complete examination. Limited examinations for reoccurring indications may be performed as noted. The reflux portion of the exam  is performed with the patient in reverse Trendelenburg.  +---------+---------------+---------+-----------+----------+--------------+ RIGHT    CompressibilityPhasicitySpontaneityPropertiesThrombus Aging +---------+---------------+---------+-----------+----------+--------------+ CFV      Full           Yes      Yes                                 +---------+---------------+---------+-----------+----------+--------------+ SFJ      Full                                                        +---------+---------------+---------+-----------+----------+--------------+ FV Prox  Full                                                        +---------+---------------+---------+-----------+----------+--------------+ FV Mid   Full                                                        +---------+---------------+---------+-----------+----------+--------------+ FV DistalFull                                                        +---------+---------------+---------+-----------+----------+--------------+ PFV      Full                                                        +---------+---------------+---------+-----------+----------+--------------+ POP      Full           Yes      Yes                                 +---------+---------------+---------+-----------+----------+--------------+ PTV      Full                                                        +---------+---------------+---------+-----------+----------+--------------+ PERO     Full                                                        +---------+---------------+---------+-----------+----------+--------------+   +---------+---------------+---------+-----------+----------+--------------+ LEFT     CompressibilityPhasicitySpontaneityPropertiesThrombus Aging +---------+---------------+---------+-----------+----------+--------------+  CFV      Full           Yes      Yes                                  +---------+---------------+---------+-----------+----------+--------------+ SFJ      Full                                                        +---------+---------------+---------+-----------+----------+--------------+ FV Prox  Full                                                        +---------+---------------+---------+-----------+----------+--------------+ FV Mid   Full                                                        +---------+---------------+---------+-----------+----------+--------------+ FV DistalFull                                                        +---------+---------------+---------+-----------+----------+--------------+ PFV      Full                                                        +---------+---------------+---------+-----------+----------+--------------+ POP      Full           Yes      Yes                                 +---------+---------------+---------+-----------+----------+--------------+ PTV      Full                                                        +---------+---------------+---------+-----------+----------+--------------+ PERO     Full                                                        +---------+---------------+---------+-----------+----------+--------------+     Summary: BILATERAL: - No evidence of deep vein thrombosis seen in the lower extremities, bilaterally. - No evidence of superficial venous thrombosis in the lower extremities, bilaterally. -No evidence of popliteal cyst, bilaterally.   *See table(s) above for measurements and observations.    Preliminary    ECHOCARDIOGRAM LIMITED  Result Date: 11/17/2020    ECHOCARDIOGRAM LIMITED REPORT   Patient Name:   TANDY LEWIN Cai Date of Exam: 11/17/2020 Medical Rec #:  [id number]          Height:       67.0 in Accession #:    [id number]         Weight:       134.0 lb Date of Birth:  [birth year]          BSA:          1.706 m Patient Age:     13 years           BP:           163/89 mmHg Patient Gender: M                  HR:           74 bpm. Exam Location:  Inpatient Procedure: Limited Echo, Limited Color Doppler and Cardiac Doppler Indications:    Elevated Troponin  History:        Patient has no prior history of Echocardiogram examinations.                 Risk Factors:Hypertension and ETOH.  Sonographer:    Bernadene Person RDCS Referring Phys: [id number] [place]  1. Left ventricular ejection fraction, by estimation, is 45 to 50%. The left ventricle has mildly decreased function. The left ventricle demonstrates regional wall motion abnormalities (see scoring diagram/findings for description). There is hypokinesis  of the anterior and anteroseptal LV wall segments. There is moderate concentric left ventricular hypertrophy. Left ventricular diastolic parameters are consistent with Grade I diastolic dysfunction (impaired relaxation).  2. Right ventricular systolic function is normal. The right ventricular size is normal. There is normal pulmonary artery systolic pressure.  3. Moderate pleural effusion in the left lateral region.  4. The mitral valve is normal in structure. Trivial mitral valve regurgitation.  5. The aortic valve is tricuspid. There is mild calcification of the aortic valve. There is mild thickening of the aortic valve. Aortic valve regurgitation is mild. Mild aortic valve sclerosis is present, with no evidence of aortic valve stenosis.  6. The inferior vena cava is normal in size with greater than 50% respiratory variability, suggesting right atrial pressure of 3 mmHg.  7. There is mildly reduced LVEF with anterior and anteroseptal wall motion abnormalities concerning for possible LAD lesion. . Comparison(s): No prior Echocardiogram. FINDINGS  Left Ventricle: Left ventricular ejection fraction, by estimation, is 45 to 50%. The left ventricle has mildly decreased function. The left ventricle demonstrates regional wall  motion abnormalities. There is hypokinesis of the anterior and anteroseptal LV wall segments. The left ventricular internal cavity size was normal in size. There is moderate concentric left ventricular hypertrophy. Left ventricular diastolic parameters are consistent with Grade I diastolic dysfunction (impaired relaxation). Normal left ventricular filling pressure. Right Ventricle: The right ventricular size is normal. No increase in right ventricular wall thickness. Right ventricular systolic function is normal. There is normal pulmonary artery systolic pressure. The tricuspid regurgitant velocity is 2.17 m/s, and  with an assumed right atrial pressure of 3 mmHg, the estimated right ventricular systolic pressure is 89.3 mmHg. Pericardium: There is no evidence of pericardial effusion. Mitral Valve: The mitral valve is normal in structure. There is mild thickening of the mitral valve leaflet(s). Mild mitral annular calcification. Trivial mitral valve regurgitation. Tricuspid Valve: The tricuspid valve is normal in structure. Tricuspid valve  regurgitation is trivial. Aortic Valve: The aortic valve is tricuspid. There is mild calcification of the aortic valve. There is mild thickening of the aortic valve. Aortic valve regurgitation is mild. Aortic regurgitation PHT measures 569 msec. Mild aortic valve sclerosis is present, with no evidence of aortic valve stenosis. Pulmonic Valve: The pulmonic valve was not well visualized. Pulmonic valve regurgitation is not visualized. Venous: The inferior vena cava is normal in size with greater than 50% respiratory variability, suggesting right atrial pressure of 3 mmHg. Additional Comments: There is a moderate pleural effusion in the left lateral region. LEFT VENTRICLE PLAX 2D LVIDd:         4.40 cm     Diastology LVIDs:         3.40 cm     LV e' medial:    7.76 cm/s LV PW:         1.30 cm     LV E/e' medial:  6.4 LV IVS:        1.10 cm     LV e' lateral:   10.00 cm/s LVOT diam:      2.20 cm     LV E/e' lateral: 4.9 LV SV:         63 LV SV Index:   37 LVOT Area:     3.80 cm  LV Volumes (MOD) LV vol d, MOD A2C: 64.6 ml LV vol d, MOD A4C: 71.3 ml LV vol s, MOD A2C: 31.4 ml LV vol s, MOD A4C: 36.3 ml LV SV MOD A2C:     33.2 ml LV SV MOD A4C:     71.3 ml LV SV MOD BP:      33.7 ml RIGHT VENTRICLE RV S prime:     11.70 cm/s TAPSE (M-mode): 2.1 cm LEFT ATRIUM         Index LA diam:    2.30 cm 1.35 cm/m  AORTIC VALVE LVOT Vmax:   90.10 cm/s LVOT Vmean:  54.100 cm/s LVOT VTI:    0.167 m AI PHT:      569 msec  AORTA Ao Root diam: 3.20 cm MITRAL VALVE               TRICUSPID VALVE MV Area (PHT): 3.27 cm    TR Peak grad:   18.8 mmHg MV Decel Time: 232 msec    TR Vmax:        217.00 cm/s MV E velocity: 49.30 cm/s MV A velocity: 53.60 cm/s  SHUNTS MV E/A ratio:  0.92        Systemic VTI:  0.17 m                            Systemic Diam: 2.20 cm Gwyndolyn Kaufman MD Electronically signed by Gwyndolyn Kaufman MD Signature Date/Time: 11/17/2020/10:24:55 AM    Final       Assessment/Plan:  INTERVAL HISTORY patient is status lumbar puncture which has only 1 white cell present and only 1 red blood cell glucose of 66 and protein of 50   Active Problems:   Acute encephalopathy   Pressure injury of skin   Acute renal failure (HCC)   Traumatic rhabdomyolysis (HCC)   Bullous skin disease   Multiple cerebral infarctions (HCC)   Rectal cancer (HCC)   Acute combined systolic and diastolic heart failure (HCC)    [name] is a 65 y.o. male with HIV, hepatitis C coinfection alcohol and cocaine abuse history of  rectal cancer who was found down after not having been seen for 24 hours.  He was brought to the emergency department where he was found to be encephalopathic at attended with acute renal failure and rhabdomyolysis.  He also has new bullous skin lesions on hands and legs.  MRI of the brain shows mild multiple infarcts as well as some anoxic changes.  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  #1  Encephalopathy:  This is clearly not a bacterial meningoencephalitis and we are stopping antibacterial therapy  While the MRI did show enhancement of the medial aspect of the temporal lobe his lumbar puncture was remarkably normal with 1 white blood cell and 1 red blood cell.  This is not at all characteristic of herpes encephalitis.  I will leave him on acyclovir pending herpes simplex PCR's.  #2 multiple infarcts: Being worked up by neurology echocardiogram shows new onset heart failure   #3  Rhabdomyolysis: Seems to be improving along with his acute renal failure  #4 HIV disease: Looking back through his labs we cannot find any evidence of significant NNRTI resistance.  He has been on a boosted protease inhibitor with tenofovir and emtricitabine going back over nearly a decade and a half if not longer  Fortunately this high barrier to R regimen likely protected him from failing with significant NRTI resistance.  I will start him on Biktarvy.  When I see him back in the clinic and ensure that he is suppressed we can consider a junior sure archive to look at pro viral DNA to make sure there is no resistance present there.  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 Would assume in the ensuing 6 years of untreated hepatitis C and continued alcohol abuse that it is likely worse  We have been trying to initiate therapy but he had not been good about coming to his appointments.  At his next outpatient therapy appointment I will initiate hepatitis C treatment and hope that he can complete a 2 to 3 months course  #7 Problems with polysubstance abuse and depression:  Needs to reengage with  counseling.  #8 Hx of rectal cancer and severe rectal pain with bleeding only on for well over 6 months.  He me that he was seen by [place] [facility] that but that they "did nothing" I do not know if he had HRA done> Would make sure we have CCS seem him while he is in the hospital once he hopefully recovers from his encephalopathy  I spent greater than 35 minutes with the patient including greater than 50% of time in face to face counsel of the patient reviewing all of his genotypic data and in coordination of his care.     LOS: 2 days   Alcide Evener 11/18/2020, 3:41 PM

## 2020-11-18 NOTE — Procedures (Signed)
Patient Name: Jaelen Soth  MRN: [id number]  Epilepsy Attending: Lora Havens  Referring Physician/Provider: Dr Su Monks Duration: 11/18/2020 0234 to 11/18/2020 1034  Patient history: 65yo M with ams. EEG to evaluate for seizure  Level of alertness: Awake, asleep  AEDs during EEG study: None  Technical aspects: This EEG study was done with scalp electrodes positioned according to the 10-20 International system of electrode placement. Electrical activity was acquired at a sampling rate of 500Hz  and reviewed with a high frequency filter of 70Hz  and a low frequency filter of 1Hz . EEG data were recorded continuously and digitally stored.   Description: The posterior dominant rhythm consists of 8 Hz activity of moderate voltage (25-35 uV) seen predominantly in posterior head regions, symmetric and reactive to eye opening and eye closing. Sleep was characterized by vertex waves, sleep spindles (12 to 14 Hz), maximal frontocentral region.  EEG showed continuous generalized predominantly 5 to 6 Hz theta slowing as well as intermittent 2-3Hz  delta slowing.  Hyperventilation and photic stimulation were not performed.     Of note, parts of study were difficult to interpret due to significant electrode artifact.  ABNORMALITY - Continuous slow, generalized  IMPRESSION: This study is suggestive of mild to moderate diffuse encephalopathy, nonspecific etiology. No seizures or epileptiform discharges were seen throughout the recording.  Marianny Goris Barbra Sarks

## 2020-11-18 NOTE — Progress Notes (Signed)
LTM EEG discontinued - no skin breakdown at unhook.   

## 2020-11-18 NOTE — Progress Notes (Signed)
   Neurology has requested TEE tomorrow for further work-up of stroke. Patient is not currently able to consent due to altered mental status. Attempted to call family. The only number that I can find in his chart is for a brother Ellery Meroney. Called and left message asking him to call nursing floor back.  Patient is currently scheduled for TEE tomorrow 11/19/2020 at 11:45am with Dr. Margaretann Loveless. Will go ahead and make patient NPO at midnight. However, will still need to get consent from family.   Darreld Mclean, PA-C 11/18/2020 3:57 PM

## 2020-11-18 NOTE — Progress Notes (Signed)
Subjective: No acute overnight events.   This morning, patient appeared more alert and was able to respond verbally, stating his name. He was also able to respond to questions by nodding his head. Indicated that he had no pain. Also able to follow commands and track objects with his eyes.    Objective: Vital signs in last 24 hours: Vitals:   11/17/20 2350 11/18/20 0338  BP: (!) 159/87 (!) 169/61  [155-169]  Pulse: 82 77 [76-99]  Resp: 20 18  Temp: 97.6 F (36.4 C) 98 F (36.7 C)  SpO2: 96% 93%   Physical Exam: General: Awake; less lethargic and more responsive than yesterday; no acute distress  Eyes: PERRL, EOM intact, no scleral icterus Cardiovascular: Regular, rate rhythm; no murmurs, rubs, gallops; 2+ radial pulses bilaterally Abdomen: Normal bowel sounds; soft, non-distended  Extremities: No lower extremity edema Skin: Tense bullae overlying extensor surfaces of hands, wrists, lower extremities bilaterally; overlying wound dressing over left knee  Neuro: Oriented to person, not place; more responsive, able to follow commands; symmetric strength of hands on finger squeeze which improved compared to yesterday; no facial asymmetry; no tremor, atrophy, or seizure activity; moves fingers on command. Rotates head when directed.   GCS: 14  Eye score: 4  Verbal score: 4 Motor score: 6   Intake/Output Summary (Last 24 hours) at 11/18/2020 1321 Last data filed at 11/18/2020 0929 Gross per 24 hour  Intake 10 ml  Output 2100 ml  Net -2090 ml   - Daily Input: 200 cc/hr * 24 hr = ~4.8 L   Labs in Last 24 Hours:   CK: 3742   CBC Latest Ref Rng & Units 11/18/2020 11/17/2020 11/16/2020  WBC 4.0 - 10.5 K/uL 14.2(H) 14.1(H) 18.7(H)  Hemoglobin 13.0 - 17.0 g/dL 14.0 14.9 15.8  Hematocrit 39.0 - 52.0 % 43.5 45.7 49.4  Platelets 150 - 400 K/uL 230 244 293  Neutrophil: 11.6   CMP Latest Ref Rng & Units 11/18/2020 11/17/2020 11/16/2020  Glucose 70 - 99 mg/dL 93 82 91  BUN 8 - 23 mg/dL  49(H) 76(H) 86(H)  Creatinine 0.61 - 1.24 mg/dL 1.39(H) 2.45(H) 3.32(H)  Sodium 135 - 145 mmol/L 150(H) 146(H) 143  Potassium 3.5 - 5.1 mmol/L 3.8 4.2 4.6  Chloride 98 - 111 mmol/L 119(H) 114(H) 109  CO2 22 - 32 mmol/L 20(L) 18(L) 17(L)  Calcium 8.9 - 10.3 mg/dL 8.3(L) 8.5(L) 8.5(L)  Total Protein 6.5 - 8.1 g/dL 6.1(L) 6.5 -  Total Bilirubin 0.3 - 1.2 mg/dL 0.7 1.1 -  Alkaline Phos 38 - 126 U/L 48 51 -  AST 15 - 41 U/L 141(H) 213(H) -  ALT 0 - 44 U/L 74(H) 94(H) -  Albumin: 2.0  GFR: 56  Lipid Panel:   Triglycerides - 166 LDL - 47  HDL - 53  A1c: 5.5  CSF Cell Count:  Glucose - 66 Protein - 50  Appearance - Clear  WBC count - 1 RBC count - 1   CSF Culture w/ Gram Stain:  No WBC seen, No organisms seen Culture - pending   HIV RNA Quant - 480  Blood culture - no growth 2 days   Imaging in Last 24 Hours:   VAS [place] LOWER EXTREMITY VENOUS (DVT) No evidence of deep vein thrombosis seen in the lower extremities, bilaterally. - No evidence of superficial venous thrombosis in the lower extremities, bilaterally. -No evidence of popliteal cyst, bilaterally.     Assessment/Plan:  Active Problems:   Acute encephalopathy  Pressure injury of skin   Acute renal failure (HCC)   Traumatic rhabdomyolysis (HCC)   Bullous skin disease   Multiple cerebral infarctions (HCC)   Rectal cancer (HCC)   Acute combined systolic and diastolic heart failure ([place])  In summary, [name] is a 65 y.o. man with a history of AIDS and substance use admitted with improving acute encephalopathy. Found to have new acute/subacute ischemic infarcts on MRI Brain and severe AKI with multiple metabolic abnormalities.   #Acute Encephalopathy  Acute Ischemic CVA  Patient's altered mental status continues to improve with IVF, IV antibiotics/antivirals, and thiamine administration. His GCS score today is 14, up from 3 on admission. Notably, his metabolic abnormalities have improved, suggesting a possible  metabolic component to his encephalopathy. Cause of his multiple acute/subacute infarcts on MRI brain remain unknown. May be secondary to severe hypoperfusion given likely extended period of being unattended with no fluid intake. No thromboembolic source found on lower extremity vascular U/S today. No lipid abnormalities noted and patient is not diabetic. No signs of septic emboli on ECHO. No signs of infection on LP, CXR, and U/A. 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 Wernicke's encephalopathy may also be contributing given improvement with thiamine supplementation during hospital course.  - Neurology consulted, appreciate recommendations  - ID consulted, appreciate recommendations  - Ordered MRI Angio Head/Neck - Continue acyclovir 600 mg IV Q12H for possible HSV - Continue ceftriaxone 2 g IV daily - Thiamine 500 mg injections TID for 5 days (2/5) - f/u CSF analysis (HSV)  - f/u RPR, VRL  - f/u Thiamine levels   #Rhabdomyolysis  Improving with IVF. Down-trending CK, AST/ALT noted today. Urine output is approximately 62.5 cc/hr which is on target (~61-180 cc/hr).  - IVF D5W 100 cc/hr; titrate if needed to maintain 1-3 ml/kg/hr output   #Hypernatremia Na 150, up from 141 on admission. Likely secondary to continuous IVF LR since admission.  - Switch IVF to D5W 100 cc/hr   #Bullous Lesions Patient presented with tense, bullous lesions on extensor surfaces of hands, knees bilaterally. 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 Bullous pemphigoid is also possible given tense nature of bullae. On home Symtuza, which can cause stevens-johnson syndrome and this can present with fluid-filled bullae. SJS is unlikely as patient has taken this medication for extended period of time without complications  and no skin sloughing noted.  - f/u serum, urine porphyrin    #AKI, improving  Hyperkalemia, resolved Both AKI and hyperkalemia have improved with continuous IVF. Cr 1.39, GFR 56, improved from Cr 5.21, GFR 11 on admission. Close to his baseline Cr ~1.00, GFR>60. Potassium 3.8 this morning, from 5.9 on admission. Etiology of AKI, hyperkalemia likely multifactorial secondary to pre-renal and intrinsic etiologies (rhabdomyolysis).  - IVF D5W 100 cc/hr  - Daily CMP to monitor renal function, electrolytes   #AIDS CD4 count of 197, down from 238 on 08/09/20. HIV RNA increased to 480, down from <20 3 months ago. Currently holding his home symtuza due to concerns for possible SJS/TEN given bullae on physical exam. Per ID, patient has recently been adherent to his home medications. However, his low CD4 count and increased HIV RNA are concerning and he at high risk for a number of opportunistic infections. 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 EKG on admission notable for ST depressions at V4, V5 leads -- unchanged from prior EKD in January 2022. Troponins also elevated but down-trending. Patient likely had an NSTEMI secondary to demand ischemia given his history of chronic hypertension. No signs of hyperlipidemia, DM noted on labs.  - Cardiology consulted, appreciate recommendations  - Awaiting initiation neurology evaluation to initiate anti-hypertensive   #Chronic hypertension   Patient's BP has improved without medication since admission, now near his baseline. His cocaine use may also be contributing to his increased blood pressure on admission. Will hold off on decreasing blood pressure at this time given ischemic stroke to facilitate permissive hypertension.  - Appreciate cardiology recommendations       LOS: 2 days   Gwynneth Albright, Medical Student 11/18/2020, 1:21 PM

## 2020-11-19 ENCOUNTER — Encounter (HOSPITAL_COMMUNITY)
Admission: EM | Disposition: A | Discharge status: Skilled Nursing Facility | Payer: Self-pay | Source: Home / Self Care | Attending: Internal Medicine

## 2020-11-19 ENCOUNTER — Inpatient Hospital Stay (HOSPITAL_COMMUNITY): Payer: Medicare Other

## 2020-11-19 ENCOUNTER — Inpatient Hospital Stay (HOSPITAL_COMMUNITY): Payer: Medicare Other | Admitting: Anesthesiology

## 2020-11-19 DIAGNOSIS — I10 Essential (primary) hypertension: Secondary | ICD-10-CM

## 2020-11-19 DIAGNOSIS — M6282 Rhabdomyolysis: Secondary | ICD-10-CM

## 2020-11-19 DIAGNOSIS — F1920 Other psychoactive substance dependence, uncomplicated: Secondary | ICD-10-CM

## 2020-11-19 DIAGNOSIS — Z85048 Personal history of other malignant neoplasm of rectum, rectosigmoid junction, and anus: Secondary | ICD-10-CM

## 2020-11-19 DIAGNOSIS — L139 Bullous disorder, unspecified: Secondary | ICD-10-CM | POA: Diagnosis not present

## 2020-11-19 DIAGNOSIS — G9340 Encephalopathy, unspecified: Secondary | ICD-10-CM | POA: Diagnosis not present

## 2020-11-19 DIAGNOSIS — I5041 Acute combined systolic (congestive) and diastolic (congestive) heart failure: Secondary | ICD-10-CM | POA: Diagnosis not present

## 2020-11-19 DIAGNOSIS — N179 Acute kidney failure, unspecified: Secondary | ICD-10-CM | POA: Diagnosis not present

## 2020-11-19 DIAGNOSIS — R1312 Dysphagia, oropharyngeal phase: Secondary | ICD-10-CM

## 2020-11-19 DIAGNOSIS — R000 Tachycardia, unspecified: Secondary | ICD-10-CM

## 2020-11-19 DIAGNOSIS — B20 Human immunodeficiency virus [HIV] disease: Secondary | ICD-10-CM | POA: Diagnosis not present

## 2020-11-19 DIAGNOSIS — I639 Cerebral infarction, unspecified: Secondary | ICD-10-CM

## 2020-11-19 DIAGNOSIS — K148 Other diseases of tongue: Secondary | ICD-10-CM

## 2020-11-19 DIAGNOSIS — E875 Hyperkalemia: Secondary | ICD-10-CM

## 2020-11-19 DIAGNOSIS — F32A Depression, unspecified: Secondary | ICD-10-CM

## 2020-11-19 HISTORY — PX: BUBBLE STUDY: SHX6837

## 2020-11-19 HISTORY — PX: TEE WITHOUT CARDIOVERSION: SHX5443

## 2020-11-19 LAB — CBC WITH DIFFERENTIAL/PLATELET
Abs Immature Granulocytes: 0 10*3/uL (ref 0.00–0.07)
Basophils Absolute: 0 10*3/uL (ref 0.0–0.1)
Basophils Relative: 0 %
Eosinophils Absolute: 0.1 10*3/uL (ref 0.0–0.5)
Eosinophils Relative: 1 %
HCT: 41.6 % (ref 39.0–52.0)
Hemoglobin: 13.2 g/dL (ref 13.0–17.0)
Lymphocytes Relative: 22 %
Lymphs Abs: 2.5 10*3/uL (ref 0.7–4.0)
MCH: 29.1 pg (ref 26.0–34.0)
MCHC: 31.7 g/dL (ref 30.0–36.0)
MCV: 91.8 fL (ref 80.0–100.0)
Monocytes Absolute: 0.8 10*3/uL (ref 0.1–1.0)
Monocytes Relative: 7 %
Neutro Abs: 7.8 10*3/uL — ABNORMAL HIGH (ref 1.7–7.7)
Neutrophils Relative %: 70 %
Platelets: 217 10*3/uL (ref 150–400)
RBC: 4.53 MIL/uL (ref 4.22–5.81)
RDW: 14.7 % (ref 11.5–15.5)
WBC: 11.2 10*3/uL — ABNORMAL HIGH (ref 4.0–10.5)
nRBC: 0 % (ref 0.0–0.2)
nRBC: 0 /100 WBC

## 2020-11-19 LAB — HEPATIC FUNCTION PANEL
ALT: 65 U/L — ABNORMAL HIGH (ref 0–44)
AST: 102 U/L — ABNORMAL HIGH (ref 15–41)
Albumin: 1.9 g/dL — ABNORMAL LOW (ref 3.5–5.0)
Alkaline Phosphatase: 50 U/L (ref 38–126)
Bilirubin, Direct: 0.1 mg/dL (ref 0.0–0.2)
Indirect Bilirubin: 0.5 mg/dL (ref 0.3–0.9)
Total Bilirubin: 0.6 mg/dL (ref 0.3–1.2)
Total Protein: 5.9 g/dL — ABNORMAL LOW (ref 6.5–8.1)

## 2020-11-19 LAB — BASIC METABOLIC PANEL
Anion gap: 7 (ref 5–15)
Anion gap: 4 — ABNORMAL LOW (ref 5–15)
BUN: 27 mg/dL — ABNORMAL HIGH (ref 8–23)
BUN: 24 mg/dL — ABNORMAL HIGH (ref 8–23)
CO2: 23 mmol/L (ref 22–32)
CO2: 23 mmol/L (ref 22–32)
Calcium: 8.1 mg/dL — ABNORMAL LOW (ref 8.9–10.3)
Calcium: 7.7 mg/dL — ABNORMAL LOW (ref 8.9–10.3)
Chloride: 119 mmol/L — ABNORMAL HIGH (ref 98–111)
Chloride: 118 mmol/L — ABNORMAL HIGH (ref 98–111)
Creatinine, Ser: 1.06 mg/dL (ref 0.61–1.24)
Creatinine, Ser: 1.05 mg/dL (ref 0.61–1.24)
GFR, Estimated: >60 mL/min (ref >60)
GFR, Estimated: >60 mL/min (ref >60)
Glucose, Bld: 110 mg/dL — ABNORMAL HIGH (ref 70–99)
Glucose, Bld: 131 mg/dL — ABNORMAL HIGH (ref 70–99)
Potassium: 3.5 mmol/L (ref 3.5–5.1)
Potassium: 3.9 mmol/L (ref 3.5–5.1)
Sodium: 149 mmol/L — ABNORMAL HIGH (ref 135–145)
Sodium: 145 mmol/L (ref 135–145)

## 2020-11-19 LAB — PROTIME-INR
INR: 1.1 (ref 0.8–1.2)
Prothrombin Time: 13.7 seconds (ref 11.4–15.2)

## 2020-11-19 LAB — GLUCOSE, CAPILLARY: Glucose-Capillary: 86 mg/dL (ref 70–99)

## 2020-11-19 LAB — HSV 1/2 PCR, CSF
HSV-1 DNA: NEGATIVE
HSV-2 DNA: NEGATIVE

## 2020-11-19 LAB — CK: Total CK: 2491 U/L — ABNORMAL HIGH (ref 49–397)

## 2020-11-19 IMAGING — DX DG ABD PORTABLE 1V
1 series · 1 of 1 positions shown · non-contrast
Comparison: None

CLINICAL DATA: NG tube placement

EXAM:
PORTABLE ABDOMEN - 1 VIEW

[abdomen]
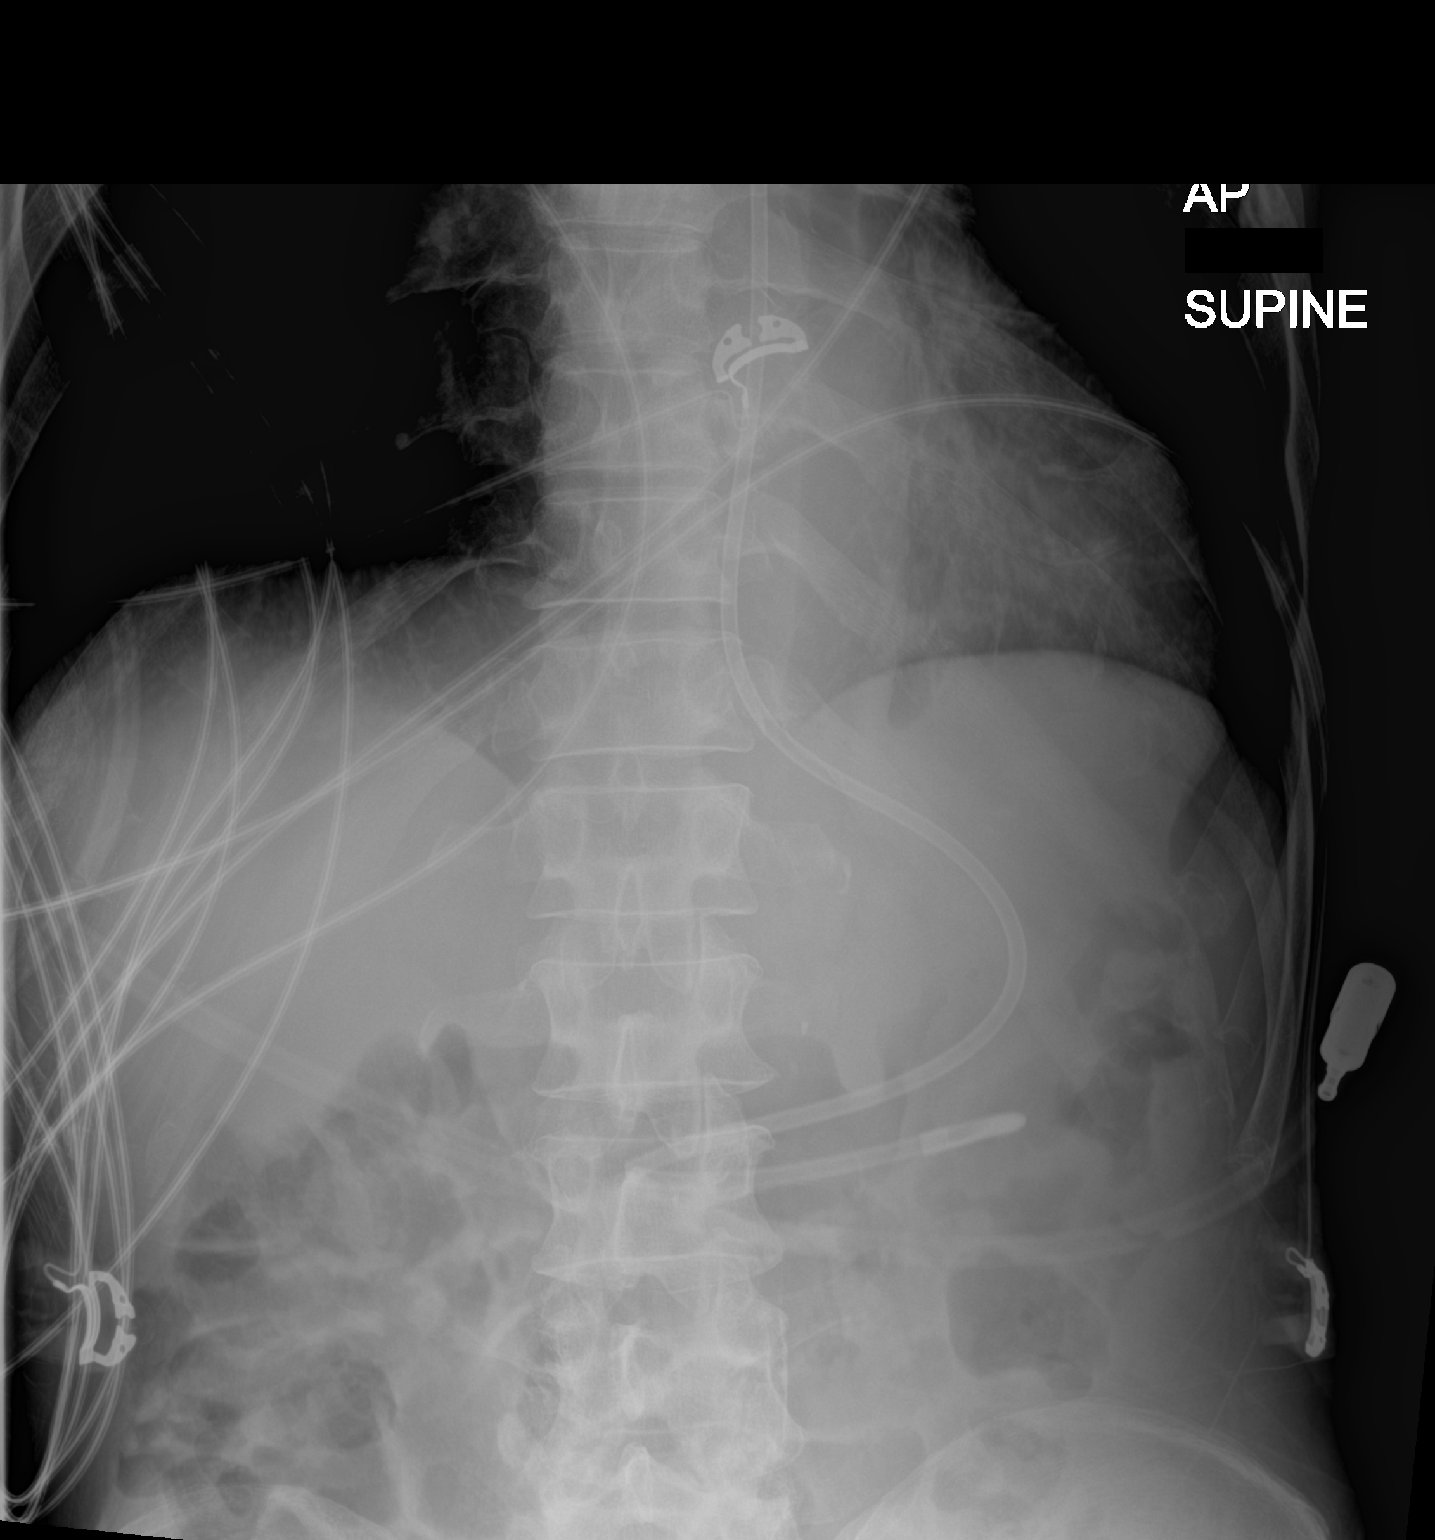

[1 of 1 positions shown; findings below may reference images not displayed]

FINDINGS: Feeding tube is in place. This folds on itself in the distal stomach
with the tip in the mid stomach.
IMPRESSION: Feeding tube loops in the stomach with the tip in the mid stomach.

## 2020-11-19 IMAGING — MR MR MRA HEAD WO/W CM
6 series · 16 of 16 positions shown · IV contrast (Yes GAD)
Comparison: Brain MRI [DATE].  Head CT [DATE].

CLINICAL DATA: Stroke, follow-up.

EXAM:
MRA HEAD WITHOUT CONTRAST
TECHNIQUE: Angiographic images of the Circle of [place] were obtained using MRA
technique without intravenous contrast.

[Series 2: ax (id) · axial · 1.0mm · 0.43mm/px · z∈[-76,+8]mm · 5 of 176 slices shown]
[im 1/176]
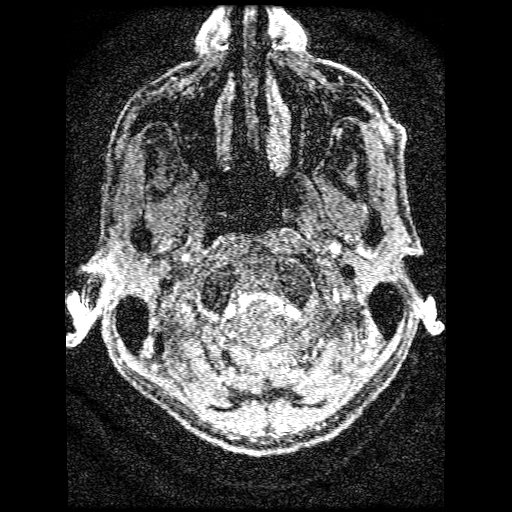
[im 44/176]
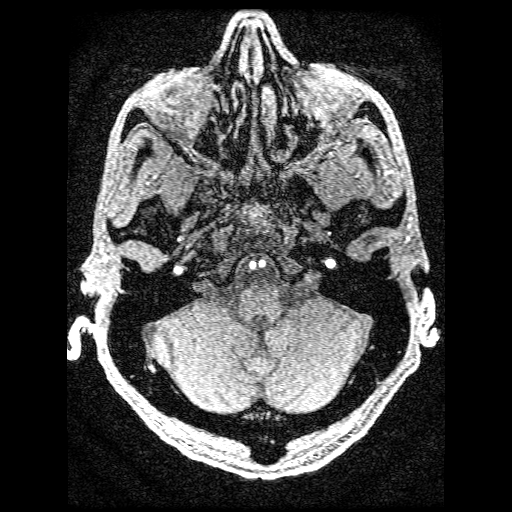
[im 88/176]
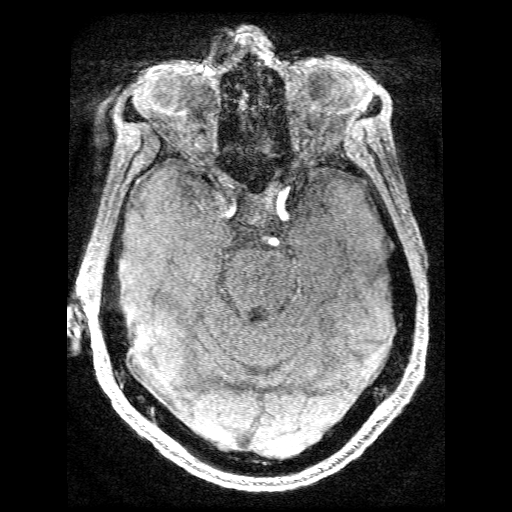
[im 132/176]
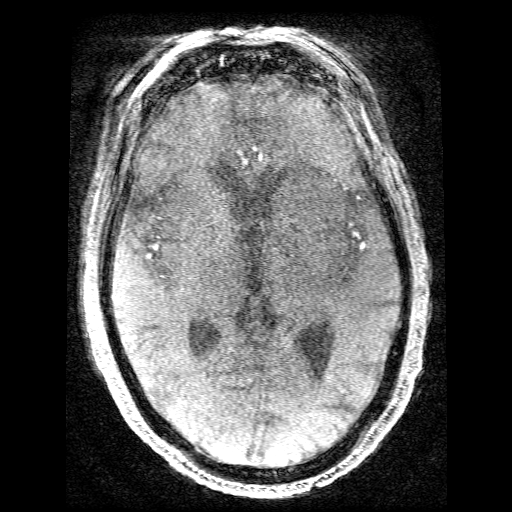
[im 176/176]
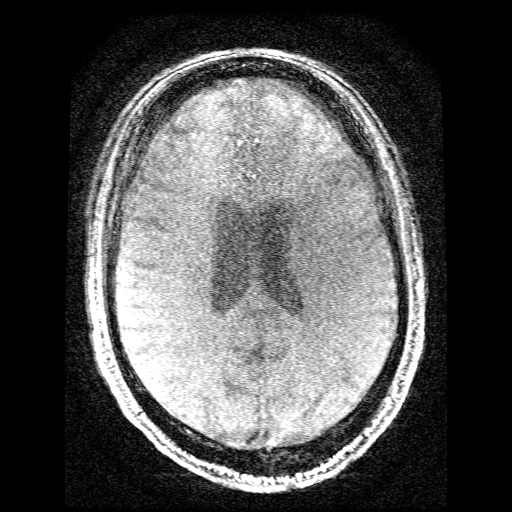

[Series 5: TOF · axial · 2.4mm · 0.47mm/px · z∈[-249,-84]mm · 3 of 152 slices shown]
[im 1/152]
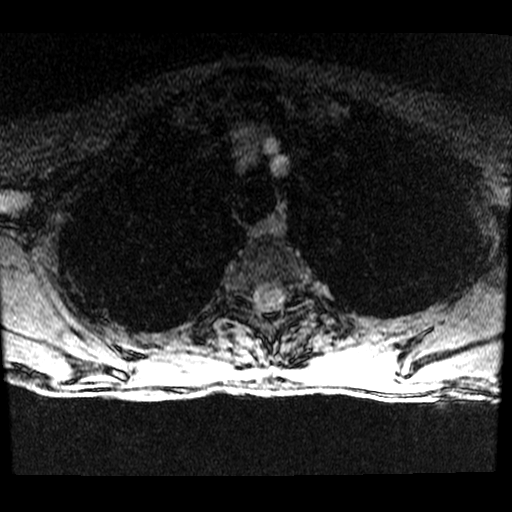
[im 76/152]
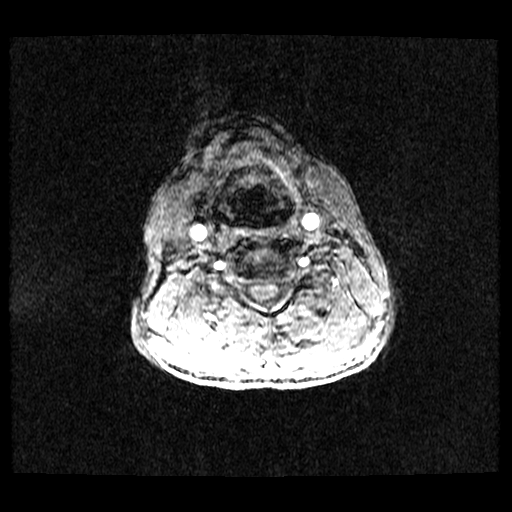
[im 152/152]
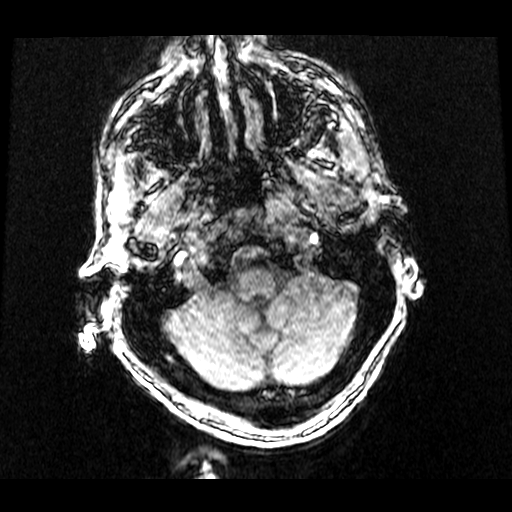

[Series 601: ph1/cor cemra ft · coronal · 1.2mm · 0.59mm/px · 2 of 105 slices shown]
[im 1/105]
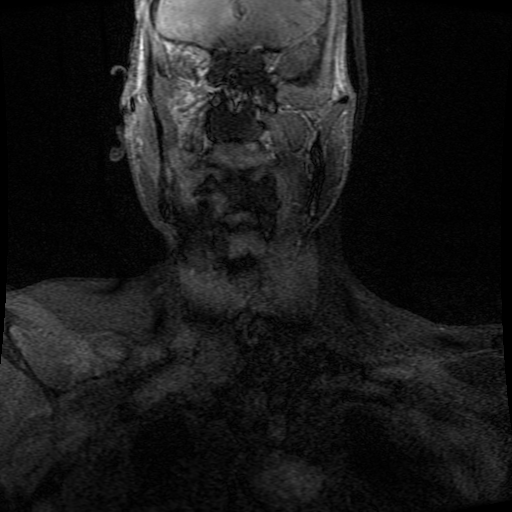
[im 105/105]
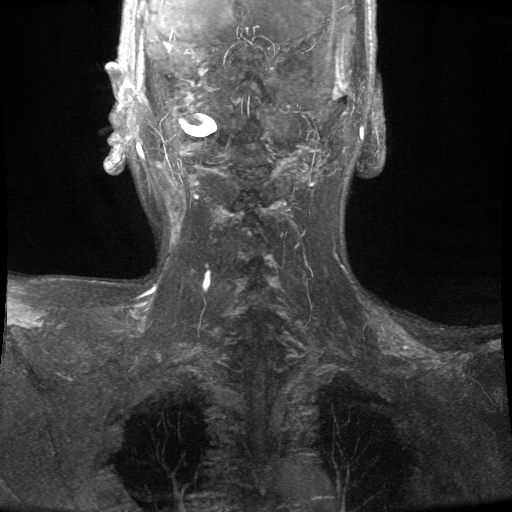

[Series 602: ph2/cor cemra ft · coronal · 1.2mm · 0.59mm/px · 2 of 104 slices shown]
[im 1/104]
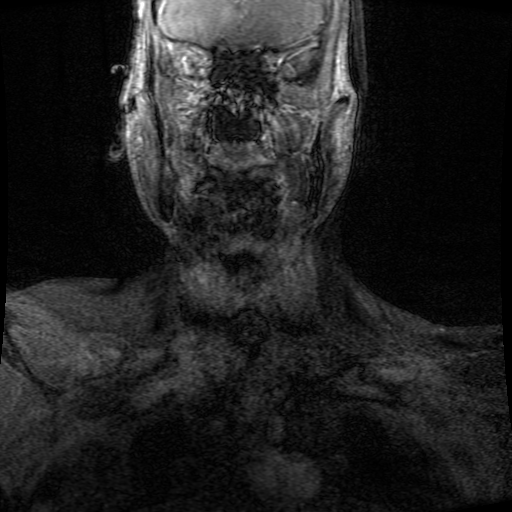
[im 104/104]
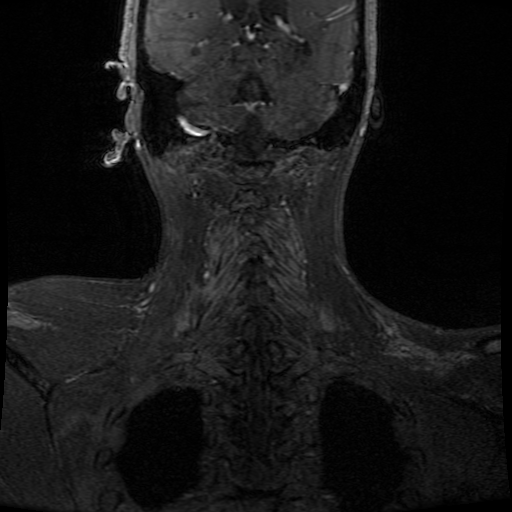

[((date))-((date)) · coronal · 1.2mm · 0.59mm/px · 2 of 105 slices shown (1 of 2)]
[im 1/105]
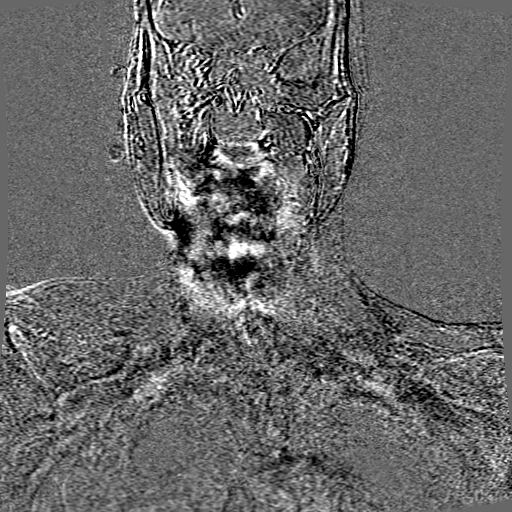
[im 105/105]
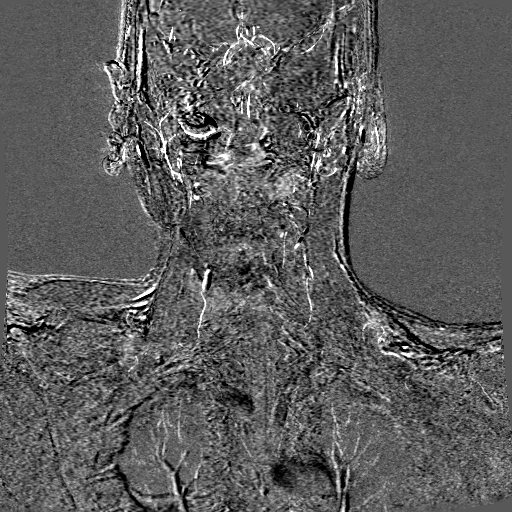

[((date))-((date)) · coronal · 1.2mm · 0.59mm/px · 2 of 105 slices shown (2 of 2)]
[im 1/105]
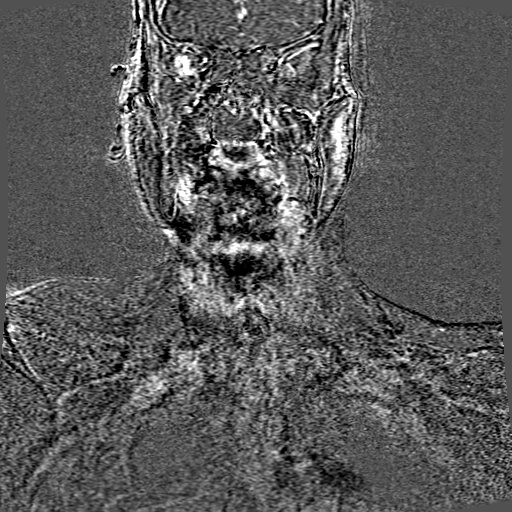
[im 105/105]
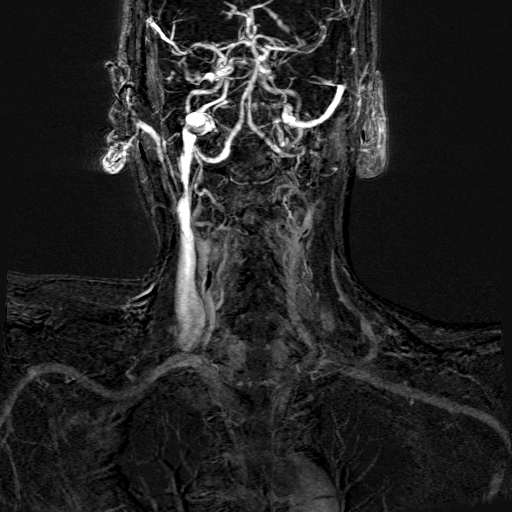

[16 of 16 positions shown; findings below may reference images not displayed]

FINDINGS: Moderate to moderately severe motion degradation, significantly
limiting evaluation.

The intracranial internal carotid arteries are patent. The M1 middle
cerebral arteries are patent. No M2 proximal branch occlusion is
identified. The anterior cerebral arteries are patent. Hypoplastic
right A1 segment. Within described limitations, no severe proximal
anterior circulation arterial stenosis is identified.

The intracranial vertebral arteries are patent. The basilar artery
is patent. The posterior cerebral arteries are patent. Within
described limitations, no severe proximal posterior circulation
arterial stenosis is identified.

1-2 mm laterally projecting aneurysm arising from the cavernous
right ICA (series 2, image 72).
IMPRESSION: The examination is motion degraded to a moderate to moderately
severe degree, significantly limiting evaluation for arterial
stenoses.

Within this limitation, no large vessel occlusion or severe proximal
arterial stenosis is identified.

1-2 mm laterally projecting aneurysm arising from the cavernous
right ICA.

## 2020-11-19 SURGERY — ECHOCARDIOGRAM, TRANSESOPHAGEAL
Anesthesia: Monitor Anesthesia Care

## 2020-11-19 MED ORDER — PROPOFOL 10 MG/ML IV BOLUS
INTRAVENOUS | Status: DC | PRN
  Administered 2020-11-19: 20 mg via INTRAVENOUS

## 2020-11-19 MED ORDER — DEXMEDETOMIDINE (PRECEDEX) IN NS 20 MCG/5ML (4 MCG/ML) IV SYRINGE
PREFILLED_SYRINGE | INTRAVENOUS | Status: DC | PRN
  Administered 2020-11-19: 8 ug via INTRAVENOUS
  Administered 2020-11-19: 4 ug via INTRAVENOUS

## 2020-11-19 MED ORDER — CARVEDILOL 3.125 MG PO TABS
3.1250 mg | ORAL_TABLET | Freq: Two times a day (BID) | ORAL | Status: DC
  Administered 2020-11-20 – 2020-11-26 (×9): 3.125 mg
  Filled 2020-11-19 (×12): qty 1

## 2020-11-19 MED ORDER — ACETAMINOPHEN 650 MG RE SUPP: 650.0000 mg | Freq: Four times a day (QID) | RECTAL | Status: DC | PRN

## 2020-11-19 MED ORDER — GADOBUTROL 1 MMOL/ML IV SOLN
6.0000 mL | Freq: Once | INTRAVENOUS | Status: AC | PRN
  Administered 2020-11-19: 6 mL via INTRAVENOUS

## 2020-11-19 MED ORDER — OSMOLITE 1.2 CAL PO LIQD
1000.0000 mL | ORAL | Status: DC
  Filled 2020-11-19 (×5): qty 1000

## 2020-11-19 MED ORDER — SODIUM CHLORIDE 0.9 % IV SOLN: INTRAVENOUS | Status: DC

## 2020-11-19 MED ORDER — DEXTROSE 5 % IV SOLN
600.0000 mg | Freq: Three times a day (TID) | INTRAVENOUS | Status: DC
  Filled 2020-11-19: qty 12

## 2020-11-19 MED ORDER — ACETAMINOPHEN 325 MG PO TABS
650.0000 mg | ORAL_TABLET | Freq: Four times a day (QID) | ORAL | Status: DC | PRN
  Administered 2020-11-21: 650 mg
  Filled 2020-11-19: qty 2

## 2020-11-19 MED ORDER — ASPIRIN 300 MG RE SUPP
300.0000 mg | Freq: Every day | RECTAL | Status: DC
  Administered 2020-11-20: 300 mg via RECTAL
  Filled 2020-11-19: qty 1

## 2020-11-19 MED ORDER — ADULT MULTIVITAMIN W/MINERALS CH
1.0000 | ORAL_TABLET | Freq: Every day | ORAL | Status: DC
  Administered 2020-11-21 – 2020-11-23 (×3): 1
  Filled 2020-11-19 (×4): qty 1

## 2020-11-19 MED ORDER — LACTATED RINGERS IV SOLN: INTRAVENOUS | Status: DC | PRN

## 2020-11-19 MED ORDER — PHENYLEPHRINE HCL-NACL 10-0.9 MG/250ML-% IV SOLN
INTRAVENOUS | Status: DC | PRN
  Administered 2020-11-19: 20 ug/min via INTRAVENOUS

## 2020-11-19 MED ORDER — DEXTROSE 5 % IV SOLN: INTRAVENOUS | Status: DC

## 2020-11-19 MED ORDER — LOSARTAN POTASSIUM 25 MG PO TABS
25.0000 mg | ORAL_TABLET | Freq: Every day | ORAL | Status: DC
  Administered 2020-11-19 – 2020-11-23 (×4): 25 mg
  Filled 2020-11-19 (×5): qty 1

## 2020-11-19 MED ORDER — LOSARTAN POTASSIUM 25 MG PO TABS
25.0000 mg | ORAL_TABLET | Freq: Every day | ORAL | Status: DC
  Filled 2020-11-19: qty 1

## 2020-11-19 MED ORDER — ASPIRIN 81 MG PO CHEW
81.0000 mg | CHEWABLE_TABLET | Freq: Every day | ORAL | Status: DC
  Administered 2020-11-21 – 2020-11-23 (×3): 81 mg
  Filled 2020-11-19 (×3): qty 1

## 2020-11-19 MED ORDER — CARVEDILOL 3.125 MG PO TABS: 3.1250 mg | ORAL_TABLET | Freq: Two times a day (BID) | ORAL | Status: DC

## 2020-11-19 MED ORDER — PROPOFOL 500 MG/50ML IV EMUL
INTRAVENOUS | Status: DC | PRN
  Administered 2020-11-19: 25 ug/kg/min via INTRAVENOUS

## 2020-11-19 NOTE — Progress Notes (Signed)
PHARMACY NOTE:  ANTIMICROBIAL RENAL DOSAGE ADJUSTMENT  Current antimicrobial regimen includes a mismatch between antimicrobial dosage and estimated renal function.  As per policy approved by the Pharmacy & Therapeutics and Medical Executive Committees, the antimicrobial dosage will be adjusted accordingly.  Current antimicrobial dosage: Acyclovir 10 mg/kg every 12 hours  Indication: HSV encephalitis  Renal Function:  Estimated Creatinine Clearance: 60.4 mL/min (by C-G formula based on SCr of 1.06 mg/dL). []      On intermittent HD, scheduled: []      On CRRT    Antimicrobial dosage has been changed to:  Acyclovir 10 mg/kg every 8 hours   Additional comments:   Thank you for allowing pharmacy to be a part of this patient's care. Zoeie Ritter S. Alford Highland, PharmD, BCPS Clinical Staff Pharmacist [email or web address] 11/19/2020 9:45 AM

## 2020-11-19 NOTE — Progress Notes (Signed)
Spoke with patients brother, [name], via phone. Obtained consent to proceed with TEE today. Second nurse, Rudene Anda, RN, verified and witnessed telephone consent.

## 2020-11-19 NOTE — Progress Notes (Signed)
1400 patient arrived back to unit from endo. He is alert, not following commands. He has noted cough since TEE, nonproductive. VSS. When asked if in pain he does respond 'no'.   1440 Foley removed per order, 10cc removed from balloon, patient tolerated well.  1445 all wound care performed at this time. Patient grimaced with tenderness to sacral wound. Patient also washed and repositioned.

## 2020-11-19 NOTE — Plan of Care (Signed)
Patient resting in bed. VSS. BP elevated, provider okay with this. Mitts on to prevent patient from playing with foley and pulling at skin. Foam drsg intact. Foley in place. NPO. Plan for TEE today. Call bell within reach. Bed alarm on.   Problem: Education: Goal: Knowledge of General Education information will improve Description: Including pain rating scale, medication(s)/side effects and non-pharmacologic comfort measures Outcome: Progressing   Problem: Clinical Measurements: Goal: Ability to maintain clinical measurements within normal limits will improve Outcome: Progressing Goal: Will remain free from infection Outcome: Progressing Goal: Diagnostic test results will improve Outcome: Progressing Goal: Respiratory complications will improve Outcome: Progressing Goal: Cardiovascular complication will be avoided Outcome: Progressing   Problem: Activity: Goal: Risk for activity intolerance will decrease Outcome: Progressing   Problem: Elimination: Goal: Will not experience complications related to bowel motility Outcome: Progressing Goal: Will not experience complications related to urinary retention Outcome: Progressing   Problem: Pain Managment: Goal: General experience of comfort will improve Outcome: Progressing   Problem: Safety: Goal: Ability to remain free from injury will improve Outcome: Progressing

## 2020-11-19 NOTE — Anesthesia Postprocedure Evaluation (Signed)
Anesthesia Post Note  Patient: [name]  Procedure(s) Performed: TRANSESOPHAGEAL ECHOCARDIOGRAM (TEE) (N/A ) BUBBLE STUDY     Patient location during evaluation: Endoscopy Anesthesia Type: MAC Level of consciousness: awake and alert Pain management: pain level controlled Vital Signs Assessment: post-procedure vital signs reviewed and stable Respiratory status: spontaneous breathing, nonlabored ventilation, respiratory function stable and patient connected to nasal cannula oxygen Cardiovascular status: stable and blood pressure returned to baseline Postop Assessment: no apparent nausea or vomiting Anesthetic complications: no   No complications documented.  Last Vitals:  Vitals:   11/19/20 1347 11/19/20 1400  BP: (!) 159/72 (!) 158/74  Pulse: 60 64  Resp: 18 18  Temp:    SpO2: 98% 98%    Last Pain:  Vitals:   11/19/20 1400  TempSrc:   PainSc: 0-No pain   Pain Goal:                   Latausha Flamm COKER

## 2020-11-19 NOTE — Anesthesia Preprocedure Evaluation (Signed)
Anesthesia Evaluation  Patient identified by MRN, date of birth, ID band Patient confused    Reviewed: Patient's Chart, lab work & pertinent test results, Unable to perform ROS - Chart review only  Airway Mallampati: II  TM Distance: >3 FB     Dental  (+) Partial Upper, Poor Dentition,    Pulmonary Current Smoker,     + decreased breath sounds      Cardiovascular hypertension,  Rhythm:Regular Rate:Normal     Neuro/Psych    GI/Hepatic   Endo/Other    Renal/GU      Musculoskeletal   Abdominal   Peds  Hematology   Anesthesia Other Findings   Reproductive/Obstetrics                             Anesthesia Physical Anesthesia Plan  ASA: III  Anesthesia Plan: MAC   Post-op Pain Management:    Induction: Intravenous  PONV Risk Score and Plan: Metaclopromide and Propofol infusion  Airway Management Planned:   Additional Equipment:   Intra-op Plan:   Post-operative Plan:   Informed Consent: I have reviewed the patients History and Physical, chart, labs and discussed the procedure including the risks, benefits and alternatives for the proposed anesthesia with the patient or authorized representative who has indicated his/her understanding and acceptance.       Plan Discussed with: CRNA and Anesthesiologist  Anesthesia Plan Comments:         Anesthesia Quick Evaluation

## 2020-11-19 NOTE — Progress Notes (Signed)
  Echocardiogram Echocardiogram Transesophageal color, doppler has been performed.  Darlina Sicilian M 11/19/2020, 1:26 PM

## 2020-11-19 NOTE — Progress Notes (Signed)
Paged MD to inform of coretrak not functioning. Possible need for placement verification. Awaiting response.

## 2020-11-19 NOTE — Interval H&P Note (Signed)
History and Physical Interval Note:  11/19/2020 12:25 PM  [name]  has presented today for surgery, with the diagnosis of stroke.  The various methods of treatment have been discussed with the patient and family. After consideration of risks, benefits and other options for treatment, the patient has consented to  Procedure(s): TRANSESOPHAGEAL ECHOCARDIOGRAM (TEE) (N/A) as a surgical intervention.  The patient's history has been reviewed, patient examined, no change in status, stable for surgery.  I have reviewed the patient's chart and labs.  Questions were answered to the patient's satisfaction.     [name]

## 2020-11-19 NOTE — Progress Notes (Signed)
Brief Nutrition Note  Consult received for enteral/tube feeding initiation and management. Plan is for Cortrak placement today.  Adult Enteral Nutrition Protocol initiated. Full assessment to follow on next date.  Admitting Dx: Hyperkalemia [E87.5] Acute encephalopathy [G93.40] Traumatic rhabdomyolysis, initial encounter ([place]) [T79.6XXA] Acute renal failure, unspecified acute renal failure type ([place]) [N17.9]  Body mass index is 21.51 kg/m. Pt meets criteria for normal weight based on current BMI.  Labs:  Recent Labs  Lab 11/17/20 0134 11/18/20 0112 11/18/20 2041 11/19/20 0236 11/19/20 1026  NA 146*   < > 149* 149* 145  K 4.2   < > 4.4 3.5 3.9  CL 114*   < > 122* 119* 118*  CO2 18*   < > 20* 23 23  BUN 76*   < > 34* 27* 24*  CREATININE 2.45*   < > 1.16 1.06 1.05  CALCIUM 8.5*   < > 7.9* 8.1* 7.7*  MG 2.6*  --   --   --   --   GLUCOSE 82   < > 100* 110* 131*   < > = values in this interval not displayed.    Gustavus Bryant, MS, RD, LDN Inpatient Clinical Dietitian Please see [facility] for contact information.

## 2020-11-19 NOTE — Progress Notes (Signed)
Subjective:  No acute overnight events.   This morning, [name] is asking for water and is feeling very thirsty. He is able to say his full name appropriately when asked, however when asked where he is located or what year it is, he continues to respond with his name.  Objective: Vital signs in last 24 hours: Today's Vitals   11/19/20 1325 11/19/20 1330 11/19/20 1340 11/19/20 1347  BP: (!) 176/87 (!) 158/71 (!) 169/82 (!) 159/72  Pulse: 88 74 (!) 58 60  Resp: (!) 24 (!) 37 (!) 36 18  Temp: 99 F (37.2 C)     TempSrc: Temporal     SpO2:  97% 96% 98%  Weight:      Height:      PainSc:    0-No pain  On room air  Intake/Output Summary (Last 24 hours) at 11/19/2020 1359 Last data filed at 11/19/2020 1315 Gross per 24 hour  Intake 2793.47 ml  Output 1000 ml  Net 1793.47 ml   Filed Weights   11/16/20 1315 11/18/20 0650 11/19/20 0500  Weight: 60.8 kg 61.7 kg 62.3 kg   Labs in last 24 hours:  CBC Latest Ref Rng & Units 11/19/2020 11/18/2020 11/17/2020  WBC 4.0 - 10.5 K/uL 11.2(H) 14.2(H) 14.1(H)  Hemoglobin 13.0 - 17.0 g/dL 13.2 14.0 14.9  Hematocrit 39.0 - 52.0 % 41.6 43.5 45.7  Platelets 150 - 400 K/uL 217 230 244   CMP Latest Ref Rng & Units 11/19/2020 11/19/2020 11/18/2020  Glucose 70 - 99 mg/dL 131(H) 110(H) 100(H)  BUN 8 - 23 mg/dL 24(H) 27(H) 34(H)  Creatinine 0.61 - 1.24 mg/dL 1.05 1.06 1.16  Sodium 135 - 145 mmol/L 145 149(H) 149(H)  Potassium 3.5 - 5.1 mmol/L 3.9 3.5 4.4  Chloride 98 - 111 mmol/L 118(H) 119(H) 122(H)  CO2 22 - 32 mmol/L 23 23 20(L)  Calcium 8.9 - 10.3 mg/dL 7.7(L) 8.1(L) 7.9(L)  Total Protein 6.5 - 8.1 g/dL - 5.9(L) -  Total Bilirubin 0.3 - 1.2 mg/dL - 0.6 -  Alkaline Phos 38 - 126 U/L - 50 -  AST 15 - 41 U/L - 102(H) -  ALT 0 - 44 U/L - 65(H) -   CK - 2,491 from 3,742  5,920  8,609  9,003 INR - 1.1 Urine porphobilinogen - in process Plasma porphyrins - in process HSV 1/2 PCR - negative Vitamin B1 - in process  Imaging in Last 24  Hours:  MR ANGIO NECK W WO CONTRAST Result Date: 11/19/2020 IMPRESSION: The bilateral common carotid and internal carotid arteries are patent within the neck without hemodynamically significant stenosis. Mild atherosclerotic plaque within the distal right common carotid artery. The vertebral arteries are codominant and patent within the neck with antegrade flow bilaterally. Moderate/severe focal stenosis within the right vertebral artery V1 segment.  MR ANGIO HEAD WO W CONTRAST Result Date: 11/19/2020 IMPRESSION: The examination is motion degraded to a moderate to moderately severe degree, significantly limiting evaluation for arterial stenoses. Within this limitation, no large vessel occlusion or severe proximal arterial stenosis is identified. 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  Assessment/Plan:  Active Problems:   Acute encephalopathy   Pressure injury of skin   Acute renal failure (HCC)   Traumatic rhabdomyolysis (HCC)   Bullous skin disease   Multiple cerebral infarctions (HCC)   Rectal cancer (HCC)   Acute combined systolic and diastolic heart failure (HCC)  [name] is a 65 year old man with a history of AIDS  and substance use admitted with acute encephalopathy found to have numerous new acute/subacute ischemic infarcts on MRI Brain, concern for HSV encephalitis and severe AKI with multiple metabolic abnormalities.  #Acute encephalopathy #Multiple acute/subacute ischemic infarcts Patient's encephalopathy continues to improve slowly with management of his conditions as described below, however he is far from his baseline. 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 Patient is scheduled for TEE today for further evaluation. Patient's HSV has resulted negative, therefore we will discontinue  acyclovir. 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 Patient does not exhibit signs of volume overload on examination. -Cardiology managing  -Start carvedilol 3.125mg  twice daily, titrate up as tolerated  -Start losartan 25mg  daily, titrate up as tolerated  #Rhabdomyolysis Patient's CK continues to improve with IVF. Patient would benefit from SLP evaluation to determine if he is able to eat and drink, and if so, patient may not need further IVF.  -SLP eval and treat -Hold further IVF at this time -Daily CK   #Hypernatremia, resolved Patient's sodium of 141 on admission trended up to 150 and now within normal limits at 145 following D5W. Patient would benefit from close monitoring to assess  -Hold further IVF at this time -Daily BMP  #Bullous Lesions Patient continues to develop additional bullous lesions since admission. His lesions are localized to the dorsal aspects of the hands, extensor surfaces of the legs and perianal region. Differential for patient's lesions includes bullous pemphigoid, levamisole-induced vasculitis, drug reaction, or porphyria cutanea tarda. Patient would benefit from punch biopsy to evaluate for cause of these lesions. Will hold off on treatment at this time. -Punch biopsy -Continue to monitor for additional lesions -Avoidance of cocaine  #AIDS -ID managing  -Patient transitioned from [place] to [place].   LOS: 3 days   Cato Mulligan, MD 11/19/2020, 1:59 PM

## 2020-11-19 NOTE — Progress Notes (Addendum)
Have been unable to reach family for permission for TEE.  Will try later and left message on his cell phone.  Have been unable to contact family.  Will cancel TEE.  Pt can resume tube feedings if needed.

## 2020-11-19 NOTE — Progress Notes (Signed)
Spoke with resident on call in reference to coretrak not functioning. She will place orders to verify placement.

## 2020-11-19 NOTE — Progress Notes (Signed)
Patient off of unit at this time for MRI

## 2020-11-19 NOTE — Progress Notes (Signed)
SLP Cancellation Note  Patient Details Name: Linsey Hirota MRN: [id number] DOB: February 01, 1956   Cancelled treatment:        Unable to see for swallow assessment. Dietitian arrived to place Cortrak and MD in with pt when attempted. Will continue efforts.    Houston Siren 11/19/2020, 4:55 PM  Orbie Pyo Colvin Caroli.Ed Risk analyst [phone] Office [phone]

## 2020-11-19 NOTE — Progress Notes (Addendum)
Subjective:  He is thirsty  Antibiotics:  Anti-infectives (From admission, onward)   Start     Dose/Rate Route Frequency Ordered Stop   11/19/20 1800  acyclovir (ZOVIRAX) 600 mg in dextrose 5 % 250 mL IVPB  Status:  Discontinued        600 mg 262 mL/hr over 60 Minutes Intravenous Every 8 hours 11/19/20 0945 11/19/20 1357   11/18/20 2200  acyclovir (ZOVIRAX) 600 mg in dextrose 5 % 250 mL IVPB  Status:  Discontinued        600 mg 262 mL/hr over 60 Minutes Intravenous Every 12 hours 11/18/20 1139 11/19/20 0945   11/18/20 1630  bictegravir-emtricitabine-tenofovir AF (BIKTARVY) 50-200-25 MG per tablet 1 tablet        1 tablet Oral Daily 11/18/20 1539     11/18/20 1000  cefTRIAXone (ROCEPHIN) 2 g in sodium chloride 0.9 % 100 mL IVPB  Status:  Discontinued        2 g 200 mL/hr over 30 Minutes Intravenous Every 12 hours 11/18/20 0651 11/18/20 0846   11/18/20 0928  acyclovir (ZOVIRAX) 600 mg in dextrose 5 % 250 mL IVPB  Status:  Discontinued        600 mg 262 mL/hr over 60 Minutes Intravenous Every 24 hours 11/17/20 1448 11/18/20 1139   11/17/20 1530  cefTRIAXone (ROCEPHIN) 2 g in sodium chloride 0.9 % 100 mL IVPB  Status:  Discontinued        2 g 200 mL/hr over 30 Minutes Intravenous Every 12 hours 11/17/20 1432 11/18/20 0651   11/17/20 1530  ampicillin (OMNIPEN) 2 g in sodium chloride 0.9 % 100 mL IVPB  Status:  Discontinued        2 g 300 mL/hr over 20 Minutes Intravenous Every 8 hours 11/17/20 1432 11/18/20 0651   11/17/20 1330  ceFEPIme (MAXIPIME) 2 g in sodium chloride 0.9 % 100 mL IVPB  Status:  Discontinued        2 g 200 mL/hr over 30 Minutes Intravenous Every 24 hours 11/16/20 1342 11/17/20 0647   11/17/20 1000  vancomycin (VANCOREADY) IVPB 750 mg/150 mL  Status:  Discontinued        750 mg 150 mL/hr over 60 Minutes Intravenous Every 24 hours 11/17/20 0654 11/17/20 0712   11/17/20 0800  ceFEPIme (MAXIPIME) 2 g in sodium chloride 0.9 % 100 mL IVPB  Status:  Discontinued         2 g 200 mL/hr over 30 Minutes Intravenous Every 24 hours 11/17/20 0647 11/17/20 1430   11/17/20 0745  vancomycin (VANCOREADY) IVPB 1000 mg/200 mL  Status:  Discontinued        1,000 mg 200 mL/hr over 60 Minutes Intravenous Every 8 hours 11/17/20 0647 11/17/20 0650   11/17/20 0745  vancomycin (VANCOREADY) IVPB 1250 mg/250 mL  Status:  Discontinued        1,250 mg 166.7 mL/hr over 90 Minutes Intravenous  Once 11/17/20 0654 11/17/20 0656   11/17/20 0730  acyclovir (ZOVIRAX) 600 mg in dextrose 5 % 250 mL IVPB  Status:  Discontinued        600 mg 262 mL/hr over 60 Minutes Intravenous Every 12 hours 11/17/20 0659 11/17/20 1448   11/17/20 0700  ampicillin (OMNIPEN) 2 g in sodium chloride 0.9 % 100 mL IVPB  Status:  Discontinued        2 g 300 mL/hr over 20 Minutes Intravenous Every 8 hours 11/17/20 0647 11/17/20 1430   11/17/20 0700  acyclovir (ZOVIRAX) 600 mg in dextrose 5 % 250 mL IVPB  Status:  Discontinued        600 mg 262 mL/hr over 60 Minutes Intravenous Every 24 hours 11/17/20 0647 11/17/20 0659   11/16/20 1345  vancomycin (VANCOREADY) IVPB 1250 mg/250 mL        1,250 mg 166.7 mL/hr over 90 Minutes Intravenous  Once 11/16/20 1331 11/16/20 1730   11/16/20 1335  vancomycin variable dose per unstable renal function (pharmacist dosing)  Status:  Discontinued         Does not apply See admin instructions 11/16/20 1335 11/16/20 2014   11/16/20 1330  ceFEPIme (MAXIPIME) 2 g in sodium chloride 0.9 % 100 mL IVPB        2 g 200 mL/hr over 30 Minutes Intravenous  Once 11/16/20 1322 11/16/20 1505   11/16/20 1330  metroNIDAZOLE (FLAGYL) IVPB 500 mg        500 mg 100 mL/hr over 60 Minutes Intravenous  Once 11/16/20 1322 11/16/20 1505   11/16/20 1330  vancomycin (VANCOREADY) IVPB 1000 mg/200 mL  Status:  Discontinued        1,000 mg 200 mL/hr over 60 Minutes Intravenous  Once 11/16/20 1322 11/16/20 1331      Medications: Scheduled Meds: . [START ON 11/20/2020] aspirin  81 mg Per Tube  Daily   Or  . [START ON 11/20/2020] aspirin  300 mg Rectal Daily  . bictegravir-emtricitabine-tenofovir AF  1 tablet Oral Daily  . carvedilol  3.125 mg Per Tube BID WC  . Chlorhexidine Gluconate Cloth  6 each Topical Daily  . [START ON 11/20/2020] losartan  25 mg Per Tube Daily  . multivitamin with minerals  1 tablet Per Tube Daily  . sodium chloride flush  3 mL Intravenous Q12H   Continuous Infusions: . feeding supplement (OSMOLITE 1.2 CAL)    . thiamine injection 500 mg (11/19/20 1549)   PRN Meds:.acetaminophen **OR** acetaminophen    Objective: Weight change: 0.6 kg  Intake/Output Summary (Last 24 hours) at 11/19/2020 1717 Last data filed at 11/19/2020 1400 Gross per 24 hour  Intake 1811.64 ml  Output 1700 ml  Net 111.64 ml   Blood pressure (!) 160/87, pulse 69, temperature 98.3 F (36.8 C), temperature source Oral, resp. rate 20, height 5\' 7"  (1.702 m), weight 62.3 kg, SpO2 98 %. Temp:  [97.9 F (36.6 C)-99.1 F (37.3 C)] 98.3 F (36.8 C) (04/15 1635) Pulse Rate:  [58-88] 69 (04/15 1635) Resp:  [18-37] 20 (04/15 1635) BP: (141-176)/(71-96) 160/87 (04/15 1635) SpO2:  [95 %-100 %] 98 % (04/15 1635) Weight:  [62.3 kg] 62.3 kg (04/15 0500)  Physical Exam: Physical Exam Constitutional:      Appearance: He is cachectic.  Eyes:     Extraocular Movements: Extraocular movements intact.  Cardiovascular:     Rate and Rhythm: Tachycardia present.  Pulmonary:     Effort: Pulmonary effort is normal. No respiratory distress.     Breath sounds: No wheezing.  Abdominal:     General: Abdomen is flat. There is no distension.  Neurological:     Mental Status: He is alert.     Comments: Oriented to self and that he is in the hospital  Psychiatric:        Speech: Speech is delayed.        Behavior: Behavior is cooperative.        Cognition and Memory: Cognition is impaired. Memory is impaired.        CBC:  BMET Recent Labs    11/19/20 0236 11/19/20 1026  NA 149*  145  K 3.5 3.9  CL 119* 118*  CO2 23 23  GLUCOSE 110* 131*  BUN 27* 24*  CREATININE 1.06 1.05  CALCIUM 8.1* 7.7*     Liver Panel  Recent Labs    11/18/20 0112 11/19/20 0236  PROT 6.1* 5.9*  ALBUMIN 2.0* 1.9*  AST 141* 102*  ALT 74* 65*  ALKPHOS 48 50  BILITOT 0.7 0.6  BILIDIR  --  0.1  IBILI  --  0.5    OP lesion 11/19/2020:      Bullous lesions now with some more vasculitis changes    Perianal bullous changes      Sedimentation Rate No results for input(s): ESRSEDRATE in the last 72 hours. C-Reactive Protein No results for input(s): CRP in the last 72 hours.  Micro Results: Recent Results (from the past 720 hour(s))  SARS CORONAVIRUS 2 (TAT 6-24 HRS) Nasopharyngeal Nasopharyngeal Swab     Status: None   Collection Time: 10/23/20  4:03 AM   Specimen: Nasopharyngeal Swab  Result Value Ref Range Status   SARS Coronavirus 2 NEGATIVE NEGATIVE Final    Comment: (NOTE) SARS-CoV-2 target nucleic acids are NOT DETECTED.  The SARS-CoV-2 RNA is generally detectable in upper and lower respiratory specimens during the acute phase of infection. Negative results do not preclude SARS-CoV-2 infection, do not rule out co-infections with other pathogens, and should not be used as the sole basis for treatment or other patient management decisions. Negative results must be combined with clinical observations, patient history, and epidemiological information. The expected result is Negative.  Fact Sheet for Patients: SugarRoll.be  Fact Sheet for Healthcare Providers: https://www.woods-mathews.com/  This test is not yet approved or cleared by the [place] FDA and  has been authorized for detection and/or diagnosis of SARS-CoV-2 by FDA under an Emergency Use Authorization (EUA). This EUA will remain  in effect (meaning this test can be used) for the duration of the COVID-19 declaration under Se ction 564(b)(1) of the  Act, 21 U.S.C. section 360bbb-3(b)(1), unless the authorization is terminated or revoked sooner.  Performed at [facility], [place] [address], [place], [place] [address]   Resp Panel by RT-PCR (Flu A&B, Covid) Nasopharyngeal Swab     Status: None   Collection Time: 11/16/20 11:28 AM   Specimen: Nasopharyngeal Swab; Nasopharyngeal(NP) swabs in vial transport medium  Result Value Ref Range Status   SARS Coronavirus 2 by RT PCR NEGATIVE NEGATIVE Final    Comment: (NOTE) SARS-CoV-2 target nucleic acids are NOT DETECTED.  The SARS-CoV-2 RNA is generally detectable in upper respiratory specimens during the acute phase of infection. The lowest concentration of SARS-CoV-2 viral copies this assay can detect is 138 copies/mL. A negative result does not preclude SARS-Cov-2 infection and should not be used as the sole basis for treatment or other patient management decisions. A negative result may occur with  improper specimen collection/handling, submission of specimen other than nasopharyngeal swab, presence of viral mutation(s) within the areas targeted by this assay, and inadequate number of viral copies(<138 copies/mL). A negative result must be combined with clinical observations, patient history, and epidemiological information. The expected result is Negative.  Fact Sheet for Patients:  EntrepreneurPulse.com.au  Fact Sheet for Healthcare Providers:  IncredibleEmployment.be  This test is no t yet approved or cleared by the [place] FDA and  has been authorized for detection and/or diagnosis of SARS-CoV-2 by FDA under an  Emergency Use Authorization (EUA). This EUA will remain  in effect (meaning this test can be used) for the duration of the COVID-19 declaration under Section 564(b)(1) of the Act, 21 U.S.C.section 360bbb-3(b)(1), unless the authorization is terminated  or revoked sooner.       Influenza A by PCR NEGATIVE NEGATIVE Final    Influenza B by PCR NEGATIVE NEGATIVE Final    Comment: (NOTE) The Xpert Xpress SARS-CoV-2/FLU/RSV plus assay is intended as an aid in the diagnosis of influenza from Nasopharyngeal swab specimens and should not be used as a sole basis for treatment. Nasal washings and aspirates are unacceptable for Xpert Xpress SARS-CoV-2/FLU/RSV testing.  Fact Sheet for Patients: EntrepreneurPulse.com.au  Fact Sheet for Healthcare Providers: IncredibleEmployment.be  This test is not yet approved or cleared by the [place] FDA and has been authorized for detection and/or diagnosis of SARS-CoV-2 by FDA under an Emergency Use Authorization (EUA). This EUA will remain in effect (meaning this test can be used) for the duration of the COVID-19 declaration under Section 564(b)(1) of the Act, 21 U.S.C. section 360bbb-3(b)(1), unless the authorization is terminated or revoked.  Performed at [facility], [place] [address], [place], [place] [address]   Culture, blood (routine x 2)     Status: None (Preliminary result)   Collection Time: 11/16/20 11:33 AM   Specimen: BLOOD  Result Value Ref Range Status   Specimen Description BLOOD SITE NOT SPECIFIED  Final   Special Requests   Final    BOTTLES DRAWN AEROBIC AND ANAEROBIC Blood Culture results may not be optimal due to an inadequate volume of blood received in culture bottles   Culture   Final    NO GROWTH 3 DAYS Performed at [facility], [place] [address] [place], [place] [address]    Report Status PENDING  Incomplete  Culture, blood (routine x 2)     Status: None (Preliminary result)   Collection Time: 11/16/20 11:33 AM   Specimen: BLOOD  Result Value Ref Range Status   Specimen Description BLOOD SITE NOT SPECIFIED  Final   Special Requests   Final    BOTTLES DRAWN AEROBIC AND ANAEROBIC Blood Culture results may not be optimal due to an inadequate volume of blood received in culture bottles    Culture   Final    NO GROWTH 3 DAYS Performed at [facility], [place] [address], [place], [place] [address]    Report Status PENDING  Incomplete  Fungus Culture With Stain     Status: None (Preliminary result)   Collection Time: 11/17/20  4:31 PM  Result Value Ref Range Status   Fungus Stain Final report  Final    Comment: (NOTE) Performed At: [facility] [place], [place] [id number] Rush Farmer MD [id number]    Fungus (Mycology) Culture PENDING  Incomplete   Fungal Source FLUID  Final    Comment: Performed at [facility], [place] [address] [place], [place] [address]  CSF culture w Stat Gram Stain     Status: None (Preliminary result)   Collection Time: 11/17/20  4:31 PM   Specimen: CSF; Cerebrospinal Fluid  Result Value Ref Range Status   Specimen Description CSF  Final   Special Requests NONE  Final   Gram Stain NO WBC SEEN NO ORGANISMS SEEN CYTOSPIN SMEAR   Final   Culture   Final    NO GROWTH 2 DAYS Performed at [facility], [place] [address],  [place], [place] [address]    Report Status PENDING  Incomplete  Fungus Culture Result     Status: None   Collection Time: 11/17/20  4:31 PM  Result Value Ref Range Status   Result 1 Comment  Final    Comment: (NOTE) KOH/Calcofluor preparation:  no fungus observed. Performed At: [facility] [place], [place] [id number] Rush Farmer MD OV:[id number]     Studies/Results: MR ANGIO NECK W WO CONTRAST  Result Date: 11/19/2020 CLINICAL DATA:  Stroke, follow-up. EXAM: MRA NECK WITHOUT AND WITH CONTRAST TECHNIQUE: Multiplanar and multiecho pulse sequences of the neck were obtained without and with intravenous contrast. Angiographic images of the neck were obtained using MRA technique without and with intravenous contrast. CONTRAST:  55mL GADAVIST GADOBUTROL 1 MMOL/ML IV SOLN COMPARISON:  No pertinent prior exams available for comparison. FINDINGS: Mildly motion degraded  exam. The visualized aortic arch is normal in caliber. Included portions of the innominate and proximal subclavian arteries have no hemodynamically significant stenosis. The bilateral common and internal carotid arteries are patent within the neck without hemodynamically significant stenosis. Mild atherosclerotic plaque within the distal right common carotid artery. The vertebral arteries are codominant and patent within the neck. Moderate/severe focal stenosis within the right vertebral artery V1 segment. IMPRESSION: The bilateral common carotid and internal carotid arteries are patent within the neck without hemodynamically significant stenosis. Mild atherosclerotic plaque within the distal right common carotid artery. The vertebral arteries are codominant and patent within the neck with antegrade flow bilaterally. Moderate/severe focal stenosis within the right vertebral artery V1 segment. Electronically Signed   By: Kellie Simmering DO   On: 11/19/2020 10:28   VAS [place] LOWER EXTREMITY VENOUS (DVT)  Result Date: 11/18/2020  Lower Venous DVT Study Indications: Stroke.  Comparison Study: no prior Performing Technologist: Abram Sander RVS  Examination Guidelines: A complete evaluation includes B-mode imaging, spectral Doppler, color Doppler, and power Doppler as needed of all accessible portions of each vessel. Bilateral testing is considered an integral part of a complete examination. Limited examinations for reoccurring indications may be performed as noted. The reflux portion of the exam is performed with the patient in reverse Trendelenburg.  +---------+---------------+---------+-----------+----------+--------------+ RIGHT    CompressibilityPhasicitySpontaneityPropertiesThrombus Aging +---------+---------------+---------+-----------+----------+--------------+ CFV      Full           Yes      Yes                                  +---------+---------------+---------+-----------+----------+--------------+ SFJ      Full                                                        +---------+---------------+---------+-----------+----------+--------------+ FV Prox  Full                                                        +---------+---------------+---------+-----------+----------+--------------+ FV Mid   Full                                                        +---------+---------------+---------+-----------+----------+--------------+  FV DistalFull                                                        +---------+---------------+---------+-----------+----------+--------------+ PFV      Full                                                        +---------+---------------+---------+-----------+----------+--------------+ POP      Full           Yes      Yes                                 +---------+---------------+---------+-----------+----------+--------------+ PTV      Full                                                        +---------+---------------+---------+-----------+----------+--------------+ PERO     Full                                                        +---------+---------------+---------+-----------+----------+--------------+   +---------+---------------+---------+-----------+----------+--------------+ LEFT     CompressibilityPhasicitySpontaneityPropertiesThrombus Aging +---------+---------------+---------+-----------+----------+--------------+ CFV      Full           Yes      Yes                                 +---------+---------------+---------+-----------+----------+--------------+ SFJ      Full                                                        +---------+---------------+---------+-----------+----------+--------------+ FV Prox  Full                                                         +---------+---------------+---------+-----------+----------+--------------+ FV Mid   Full                                                        +---------+---------------+---------+-----------+----------+--------------+ FV DistalFull                                                        +---------+---------------+---------+-----------+----------+--------------+   PFV      Full                                                        +---------+---------------+---------+-----------+----------+--------------+ POP      Full           Yes      Yes                                 +---------+---------------+---------+-----------+----------+--------------+ PTV      Full                                                        +---------+---------------+---------+-----------+----------+--------------+ PERO     Full                                                        +---------+---------------+---------+-----------+----------+--------------+     Summary: BILATERAL: - No evidence of deep vein thrombosis seen in the lower extremities, bilaterally. - No evidence of superficial venous thrombosis in the lower extremities, bilaterally. -No evidence of popliteal cyst, bilaterally.   *See table(s) above for measurements and observations. Electronically signed by Jamelle Haring on 11/18/2020 at 5:16:27 PM.    Final    MR ANGIO HEAD WO W CONTRAST  Result Date: 11/19/2020 CLINICAL DATA:  Stroke, follow-up. EXAM: MRA HEAD WITHOUT CONTRAST TECHNIQUE: Angiographic images of the Circle of Willis were obtained using MRA technique without intravenous contrast. COMPARISON:  Brain MRI 11/16/2020.  Head CT 11/16/2020. FINDINGS: Moderate to moderately severe motion degradation, significantly limiting evaluation. The intracranial internal carotid arteries are patent. The M1 middle cerebral arteries are patent. No M2 proximal branch occlusion is identified. The anterior cerebral arteries are patent.  Hypoplastic right A1 segment. Within described limitations, no severe proximal anterior circulation arterial stenosis is identified. The intracranial vertebral arteries are patent. The basilar artery is patent. The posterior cerebral arteries are patent. Within described limitations, no severe proximal posterior circulation arterial stenosis is identified. 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 IMPRESSION: The examination is motion degraded to a moderate to moderately severe degree, significantly limiting evaluation for arterial stenoses. Within this limitation, no large vessel occlusion or severe proximal arterial stenosis is identified. 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 Electronically Signed   By: Kellie Simmering DO   On: 11/19/2020 10:19      Assessment/Plan:  INTERVAL HISTORY   HSV PCR's are negative.  He now has some more vasculitic changes to his skin Bullous lesions also developing in the perianal area   he also has a new oral lesion   Active Problems:   Acute encephalopathy   Pressure injury of skin   Acute renal failure (HCC)   Traumatic rhabdomyolysis (HCC)   Bullous skin disease   Multiple cerebral infarctions (HCC)   Rectal cancer (HCC)   Acute combined systolic and diastolic  heart failure ([place])    [name] is a 65 y.o. male with HIV, hepatitis C coinfection alcohol and cocaine abuse history of rectal cancer who was found down after not having been seen for 24 hours.  He was brought to the emergency department where he was found to be encephalopathic at attended with acute renal failure and rhabdomyolysis.  He also has new bullous skin lesions on hands and legs.  MRI of the brain shows mild multiple infarcts as well as some anoxic changes.  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  #1  Encephalopathy: Likely  multifactorial This does not appear to be of an infectious etiology.  CSF analysis was unremarkable and herpes simplex PCR's have been negative.  Acyclovir stopped today.  #2 multiple infarcts: Being worked up by neurology.  She had transesophageal echocardiogram today.  #3  Rhabdomyolysis: Resolving along with his renal failure  #4 HIV disease:  He has been on a boosted protease inhibitor with tenofovir and emtricitabine going back over nearly a decade and a half if not longer  Fortunately this high barrier to R regimen likely protected him from failing with significant NRTI resistance.  I have started Biktarvy  When I see him back in the clinic and ensure that he is suppressed we can consider a junior sure archive to look at pro viral DNA to make sure there is no resistance present there.  Not instituting PCP prophylaxis yet because I think his CD4 count drop in the setting of an acute illness  #5 Bullous skin lesions:   I wondered about porphyria cutanea tarda but also wonder but other possibilities such as live him is all induced vasculitis given his cocaine abuse.  The rash does seem unusual for severe drug allergy but certainly if there is concern for this would have low threshold to transfer him to a tertiary care center with dermatology expertise and if needed burn unit expertise  Would recommend skin biopsy with material sent for pathology  #7 Tongue lesion: Agree that it has an aphthous ulcer type appearance.  Again if he has a vasculitic process due to levamisole it can cause oral lesions as well.  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 Would assume in the ensuing 6 years of untreated hepatitis C and continued alcohol abuse that it is likely worse  We have been trying to initiate therapy but he had not been good about coming to his appointments.  At his next outpatient therapy appointment I will initiate hepatitis C treatment and  hope that he can complete a 2 to 3 months course  #8 Problems with polysubstance abuse and depression:  Needs to reengage with counseling.  #9 Hx of rectal cancer and severe rectal pain with bleeding only on for well over 6 months.  He me that he was seen by [place] [facility] that but that they "did nothing" I do not know if he had HRA done> Would make sure we have [facility] seem him while he is in the hospital once he hopefully recovers from his encephalopathy  I spent greater than 35 minutes with the patient including greater than 50% of time in face to face counsel of the patient regarding his HIV disease his encephalopathy rhabdomyolysis bullous skin lesions tongue lesion strokes heart failure and in coordination of their care with the primary team  Dr [place] B[place] is on this weekend.  LOS: 3 days   Alcide Evener 11/19/2020, 5:17 PM

## 2020-11-19 NOTE — Procedures (Signed)
Cortrak  Person Inserting Tube:  Maylon Peppers C, RD Tube Type:  Cortrak - 43 inches Tube Location:  Right nare Initial Placement:  Stomach Secured by: Bridle Technique Used to Measure Tube Placement:  Documented cm marking at nare/ corner of mouth Cortrak Secured At:  74 cm    Cortrak Tube Team Note:  Consult received to place a Cortrak feeding tube.   No x-ray is required. RN may begin using tube.   If the tube becomes dislodged please keep the tube and contact the Cortrak team at [email or web address] ([email or web address]) for replacement.  If after hours and replacement cannot be delayed, place a NG tube and confirm placement with an abdominal x-ray.    Lockie Pares., RD, LDN, CNSC See AMiON for contact information

## 2020-11-19 NOTE — Progress Notes (Signed)
Primary MD talked with Brother and he gave permission for TEE.

## 2020-11-19 NOTE — Progress Notes (Addendum)
STROKE TEAM PROGRESS NOTE  Patient was admitted 4/12 after being found down at home with hypoxia to 70s. He was obtunded initially during his stay.   MRI showed extensive acute cerebrovascular ischemia with abnl restricted diffusion involving L mesial temporal lobe incl hippocampus ddx incl ischemic +/- superimposed changes of seizure.  ID, cardiology and medicine teams are managing patient.   INTERVAL HISTORY No acute events overnight.  Today patient is post TEE sedation on evaluation. He is drowsy. Opens eyes to command briefly. No verbal response, only moaning. Grips bilat hands with poor effort bilaterally. Wiggling toes to command bilat.   Nurse reports he spoke a few words only this morning. Not as alert as yesterday.  No visitors at bedside.   Vitals:   11/18/20 2338 11/19/20 0339 11/19/20 0500 11/19/20 0808  BP: (!) 151/84 (!) 162/96  (!) 141/78  Pulse: 75 74  79  Resp: 20 20  20   Temp: 98.5 F (36.9 C) 98 F (36.7 C)  99.1 F (37.3 C)  TempSrc: Axillary Axillary  Axillary  SpO2: 96% 96%  98%  Weight:   62.3 kg   Height:       CBC:  Recent Labs  Lab 11/18/20 0112 11/19/20 0236  WBC 14.2* 11.2*  NEUTROABS 11.6* 7.8*  HGB 14.0 13.2  HCT 43.5 41.6  MCV 92.4 91.8  PLT 230 017   Basic Metabolic Panel:  Recent Labs  Lab 11/17/20 0134 11/18/20 0112 11/18/20 2041 11/19/20 0236  NA 146*   < > 149* 149*  K 4.2   < > 4.4 3.5  CL 114*   < > 122* 119*  CO2 18*   < > 20* 23  GLUCOSE 82   < > 100* 110*  BUN 76*   < > 34* 27*  CREATININE 2.45*   < > 1.16 1.06  CALCIUM 8.5*   < > 7.9* 8.1*  MG 2.6*  --   --   --    < > = values in this interval not displayed.   Lipid Panel:  Recent Labs  Lab 11/18/20 0112  CHOL 123  TRIG 115  HDL 53  CHOLHDL 2.3  VLDL 23  LDLCALC 47   HgbA1c:  Recent Labs  Lab 11/18/20 0112  HGBA1C 5.5   Urine Drug Screen:  Recent Labs  Lab 11/16/20 1700  LABOPIA NONE DETECTED  COCAINSCRNUR POSITIVE*  LABBENZ NONE DETECTED   AMPHETMU NONE DETECTED  THCU NONE DETECTED  LABBARB NONE DETECTED    Alcohol Level  Recent Labs  Lab 11/16/20 1133  ETH <10   Imaging/Pertinent diagnostic studies:  MRI 1. 2.6 area of acute to early subacute ischemic infarct involving the right occipital pole, corresponding with abnormality on prior CT. Associated mild petechial hemorrhage without frank hemorrhagic transformation or significant mass effect. 2. Multiple additional scattered acute to early subacute ischemic infarcts involving the cortical and subcortical aspect of both cerebral and cerebellar hemispheres. Overall, a central thromboembolic etiology is suspected given the various vascular distributions involved. 3. Additional abnormal symmetric restricted diffusion involving the globus pallidi bilaterally. Appearance is most characteristic of a superimposed/concomitant hypoxic ischemic injury versus toxic metabolic derangement. 4. Restricted diffusion involving the mesial left temporal lobe with involvement of the left hippocampus. While this finding may be ischemic in nature as well, superimposed changes of seizure and/or limbic encephalitis could also be considered. Correlation with EEG recommended. 5. Underlying age-related cerebral atrophy with moderate chronic microvascular ischemic disease.  CT head Rounded focus of hypoattenuation  in the posterior right parietal lobe may be due to an infarct but cannot be definitively characterized. Brain MRI with and without contrast is recommended for further evaluation. Chronic microvascular ischemic change.  EEG: This study is suggestive ofmild tomoderatediffuse encephalopathy, nonspecific etiology.No seizures or epileptiform discharges were seen throughout the recording.  2D Echo  Echocardiogram 11/17/2020: Impressions: 1. Left ventricular ejection fraction, by estimation, is 45 to 50%. The  left ventricle has mildly decreased function. The left ventricle demonstrates  regional wall motion abnormalities (see scoring diagram/findings for description). There is hypokinesis of the anterior and anteroseptal LV wall segments. There is moderate concentric left ventricular hypertrophy. Left ventricular diastolic parameters are consistent with Grade I diastolic dysfunction (impaired  relaxation).  2. Right ventricular systolic function is normal. The right ventricular size is normal. There is normal pulmonary artery systolic pressure.  3. Moderate pleural effusion in the left lateral region.  4. The mitral valve is normal in structure. Trivial mitral valve regurgitation.  5. The aortic valve is tricuspid. There is mild calcification of the aortic valve. There is mild thickening of the aortic valve. Aortic valve regurgitation is mild. Mild aortic valve sclerosis is present, with no evidence of aortic valve stenosis.  6. The inferior vena cava is normal in size with greater than 50% respiratory variability, suggesting right atrial pressure of 3 mmHg.  7. There is mildly reduced LVEF with anterior and anteroseptal wall motion abnormalities concerning for possible LAD lesion.   Bilat LE doppler BILATERAL:  - No evidence of deep vein thrombosis seen in the lower extremities,  bilaterally.  - No evidence of superficial venous thrombosis in the lower extremities,  bilaterally.  -No evidence of popliteal cyst, bilaterally  PHYSICAL EXAM Gen: Resting in bed calmly with eyes open HEENT: Atraumatic, normocephalic;mucous membranes moist; oropharynx clear, tongue without atrophy or fasciculations. Resp: no extra work of breathing  CV: RRR, Skin: warm and dry with bullous lesions bilat hands and knees   Neuro: MS: Somnolent (sedated). Follows simple commands unreliably, drifts back to sleep, keeps eyes closed. Verbal response consists of moaning.  CN: PERRL 31mm Motor: Moving bilateral upper extremities at least antigravity. Poor grip effort bilaterally. Does not follow  commands for formal strength testing reliably. Does not lift BLE off bed to command but does wiggle toes bilat.  Sensory: Does not answer questions for sensory testing Coordination, gait: UTA 2/2 encephalopathy  ASSESSMENT/PLAN [name] is a 65 y.o. 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 Leukocytosis 18.4 in setting of AIDS (CD4 197) with unknown source. 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 Multiple additional scattered acute to early subacute ischemic infarcts involving the cortical and subcortical aspect of both cerebral and cerebellar hemispheres. Embolic source is suspected with work up ongoing.    Concerning for endocarditis vs. global hypoxic ischemic injury.  Less likely infectious vasculitis given clear CSF. TEE has been peformed with result pending  VTE prophylaxis - is recommended     Diet   Diet NPO time specified    No anticoagulant/antiplalet medication prior to admission  Currently on ASA 300 PR, did not pass swallow, likely need cortrak   Therapy recommendations: TBD pending progress   Disposition:  TBD  Acute encephalopathy,  multifactorial  ID following: Antibiotics stopped as meningitis not apparent. Acyclovir stopped as HSV negative.  Lumbar puncture showed clear appearance, 1 WBC. Protein 50, glucose 66,  Culture NG at 2days, neg gram stain, Fungus culture with stain negative at 2D, VDRL non-reactive, HSV-1 and 2 negative, Cryptococcal antigen negative   Metabolic derangements: rhabdomyolysis, AKI, hyperkalemia, hypernatremia   S/p hypoxic event   Concern for seizure but no seizure seen on EEG as of yet. EEG showing mild  tomoderatediffuse encephalopathy, nonspecific etiology.No seizures or epileptiform discharges were seen throughout the recording.  Hypertension  New dx  Cardiology on board:  okay to start ARBs since pt is out of permissive hypertension window. Avoid low BP.  Marland Kitchen Permissive hypertension (OK if < 220/120) but gradually normalize in 5-7 days . Long-term BP goal normotensive  Hyperlipidemia  Home meds:  None  LDL 47, at goal < 70  High intensity statin not warranted in the setting of LDL at goal.   Glucose management  HgbA1c 5.5, at goal < 7.0  CBG monitoring may be warranted in setting of NPO status in encephalopathic state   New Cardiomyopathy with Mildly Reduced EF, NSVT:  EF 45-50%  Management per cardiology team  Other Stroke Risk Factors  Advanced Age >/= 34   Current Cigarette smoker, will be advised to stop smoking  Current ETOH use, alcohol level <10, will be advised to drink no more than 2 drink(s) a day  Substance abuse - UDS:  THC NONE DETECTED, Cocaine POSITIVE. Patient advised to stop using due to stroke risk.  Obesity, Body mass index is 21.51 kg/m., BMI >/= 30 associated with increased stroke risk, recommend weight loss, diet and exercise as appropriate   Congestive heart failure  Other Active Problems Bullous lesions: unclear etiology, increasing in number, punch biopsy pending AKI: Creatinine peaked at 5.2 on admission. Acute encephalopathy in setting of acute CVA Rhabdomyolysis  AIDS: CD4 count of 197, ID managing Polysubstance abuse: UDS positive for cocaine this admission.   Hospital day # 3 This plan of care was directed by Dr. Stefanie Libel, NP-C   ATTENDING NOTE: I reviewed above note and agree with the assessment and plan. Pt was seen and examined.   Patient was seen after TEE procedure.  Patient lying in bed, somnolence, lethargic but briefly open eyes on voice. With painful stimuli, he was able to grimace but non  verbal.  Did not answer orientation questions, slight withdrawal to pain in all 4 extremities.  TEE done today with cardiology, did not show any embolic source of stroke, no endocarditis.  HSV PCR negative, acyclovir has been discontinued.  Patient not able to pass swallow, recommend cortrak tube placement for tube feeding and p.o. meds.  Currently still on aspirin PR 300.  Will follow.   Rosalin Hawking, MD PhD Stroke Neurology 11/19/2020 4:01 PM     To contact Stroke Continuity provider, please refer to [email or web address] After hours, contact General Neurology

## 2020-11-19 NOTE — TOC CAGE-AID Note (Signed)
Transition of Care [facility]) - CAGE-AID Screening   Patient Details  Name: [name] MRN: [id number] Date of Birth: January 07, 1956  [facility]) CM/SW Contact:    Benard Halsted, [place] Phone Number: 11/19/2020, 8:43 AM   Clinical Narrative: Patient disoriented and unable to participate in screening.    CAGE-AID Screening: Substance Abuse Screening unable to be completed due to: : Patient unable to participate

## 2020-11-19 NOTE — Transfer of Care (Signed)
Immediate Anesthesia Transfer of Care Note  Patient: [name]  Procedure(s) Performed: TRANSESOPHAGEAL ECHOCARDIOGRAM (TEE) (N/A ) BUBBLE STUDY  Patient Location: Endoscopy Unit  Anesthesia Type:MAC  Level of Consciousness: drowsy and patient cooperative  Airway & Oxygen Therapy: Patient Spontanous Breathing and Patient connected to nasal cannula oxygen  Post-op Assessment: Report given to RN and Post -op Vital signs reviewed and stable  Post vital signs: Reviewed and stable  Last Vitals:  Vitals Value Taken Time  BP 176/87 11/19/20 1318  Temp    Pulse 88 11/19/20 1326  Resp 26 11/19/20 1326  SpO2 100 % 11/19/20 1326  Vitals shown include unvalidated device data.  Last Pain:  Vitals:   11/19/20 1055  TempSrc: Oral  PainSc: 0-No pain         Complications: No complications documented.

## 2020-11-19 NOTE — CV Procedure (Signed)
INDICATIONS: stroke  PROCEDURE:   Informed consent was obtained prior to the procedure. The risks, benefits and alternatives for the procedure were discussed and the patient comprehended these risks.  Risks include, but are not limited to, cough, sore throat, vomiting, nausea, somnolence, esophageal and stomach trauma or perforation, bleeding, low blood pressure, aspiration, pneumonia, infection, trauma to the teeth and death.    After a procedural time-out, the oropharynx was anesthetized with 20% benzocaine spray.   During this procedure the patient was administered propofol per anesthesia.  The patient's heart rate, blood pressure, and oxygen saturation were monitored continuously during the procedure. The period of conscious sedation was 30 minutes, of which I was present face-to-face 100% of this time.  The transesophageal probe was inserted in the esophagus and stomach without difficulty and multiple views were obtained.  The patient was kept under observation until the patient left the procedure room.  The patient left the procedure room in stable condition.   Agitated microbubble saline contrast was administered.  COMPLICATIONS:    There were no immediate complications.  FINDINGS:   FORMAL ECHOCARDIOGRAM REPORT PENDING  No intracardiac source of embolism. No valvular vegetations. Mild aortic valve regurgitation. Descending aorta and aortic arch with mild-moderate atherosclerosis.   RECOMMENDATIONS:    Return to hospital room when stable.  Time Spent Directly with the Patient:  60 minutes   [name] 11/19/2020, 1:24 PM

## 2020-11-19 NOTE — Progress Notes (Addendum)
TEE cancelled today as unable to obtain consent.  We can reschedule once able to consent.  Otherwise no further cardiology work-up recommended at this time.  Would recommend adding carvedilol and losartan once OK from neurology perspective following CVA.  Will plan to see in follow-up and consider ischemic evaluation once recovers.  [facility] will sign off.   Medication Recommendations:  Add losartan and carvedilol once able Other recommendations (labs, testing, etc):  None Follow up as an outpatient:  Will schedule

## 2020-11-20 ENCOUNTER — Inpatient Hospital Stay (HOSPITAL_COMMUNITY): Payer: Medicare Other

## 2020-11-20 DIAGNOSIS — L138 Other specified bullous disorders: Secondary | ICD-10-CM

## 2020-11-20 DIAGNOSIS — I6389 Other cerebral infarction: Secondary | ICD-10-CM | POA: Diagnosis not present

## 2020-11-20 DIAGNOSIS — N179 Acute kidney failure, unspecified: Secondary | ICD-10-CM | POA: Diagnosis not present

## 2020-11-20 DIAGNOSIS — G9349 Other encephalopathy: Secondary | ICD-10-CM

## 2020-11-20 DIAGNOSIS — L958 Other vasculitis limited to the skin: Secondary | ICD-10-CM

## 2020-11-20 DIAGNOSIS — M6282 Rhabdomyolysis: Secondary | ICD-10-CM | POA: Diagnosis not present

## 2020-11-20 LAB — ECHO TEE
AR max vel: 2.81 cm2
AV Area VTI: 3.32 cm2
AV Area mean vel: 3.55 cm2
AV Mean grad: 3 mmHg
AV Peak grad: 7.7 mmHg
Ao pk vel: 1.39 m/s
Height: 67 in
Weight: 2197.55 oz

## 2020-11-20 LAB — CBC WITH DIFFERENTIAL/PLATELET
Abs Immature Granulocytes: 0.79 10*3/uL — ABNORMAL HIGH (ref 0.00–0.07)
Basophils Absolute: 0 10*3/uL (ref 0.0–0.1)
Basophils Relative: 0 %
Eosinophils Absolute: 0.1 10*3/uL (ref 0.0–0.5)
Eosinophils Relative: 1 %
HCT: 44.3 % (ref 39.0–52.0)
Hemoglobin: 13.9 g/dL (ref 13.0–17.0)
Immature Granulocytes: 8 %
Lymphocytes Relative: 11 %
Lymphs Abs: 1.2 10*3/uL (ref 0.7–4.0)
MCH: 29.4 pg (ref 26.0–34.0)
MCHC: 31.4 g/dL (ref 30.0–36.0)
MCV: 93.9 fL (ref 80.0–100.0)
Monocytes Absolute: 1.1 10*3/uL — ABNORMAL HIGH (ref 0.1–1.0)
Monocytes Relative: 10 %
Neutro Abs: 7.4 10*3/uL (ref 1.7–7.7)
Neutrophils Relative %: 70 %
Platelets: 234 10*3/uL (ref 150–400)
RBC: 4.72 MIL/uL (ref 4.22–5.81)
RDW: 14.5 % (ref 11.5–15.5)
WBC: 10.6 10*3/uL — ABNORMAL HIGH (ref 4.0–10.5)
nRBC: 0.2 % (ref 0.0–0.2)

## 2020-11-20 LAB — BASIC METABOLIC PANEL
Anion gap: 9 (ref 5–15)
BUN: 18 mg/dL (ref 8–23)
CO2: 22 mmol/L (ref 22–32)
Calcium: 8.2 mg/dL — ABNORMAL LOW (ref 8.9–10.3)
Chloride: 115 mmol/L — ABNORMAL HIGH (ref 98–111)
Creatinine, Ser: 0.98 mg/dL (ref 0.61–1.24)
GFR, Estimated: >60 mL/min (ref >60)
Glucose, Bld: 88 mg/dL (ref 70–99)
Potassium: 3.3 mmol/L — ABNORMAL LOW (ref 3.5–5.1)
Sodium: 146 mmol/L — ABNORMAL HIGH (ref 135–145)

## 2020-11-20 LAB — GLUCOSE, CAPILLARY
Glucose-Capillary: 100 mg/dL — ABNORMAL HIGH (ref 70–99)
Glucose-Capillary: 103 mg/dL — ABNORMAL HIGH (ref 70–99)
Glucose-Capillary: 85 mg/dL (ref 70–99)

## 2020-11-20 LAB — MAGNESIUM
Magnesium: 2 mg/dL (ref 1.7–2.4)
Magnesium: 2 mg/dL (ref 1.7–2.4)

## 2020-11-20 LAB — PROTIME-INR
INR: 1 (ref 0.8–1.2)
Prothrombin Time: 13.3 seconds (ref 11.4–15.2)

## 2020-11-20 LAB — PHOSPHORUS
Phosphorus: 2.8 mg/dL (ref 2.5–4.6)
Phosphorus: 3 mg/dL (ref 2.5–4.6)

## 2020-11-20 LAB — CK: Total CK: 1553 U/L — ABNORMAL HIGH (ref 49–397)

## 2020-11-20 IMAGING — DX DG ABD PORTABLE 1V
1 series · 1 of 1 positions shown · non-contrast
Comparison: [DATE]

CLINICAL DATA: Assess for NG tube placement.

EXAM:
PORTABLE ABDOMEN - 1 VIEW

[abdomen supine]
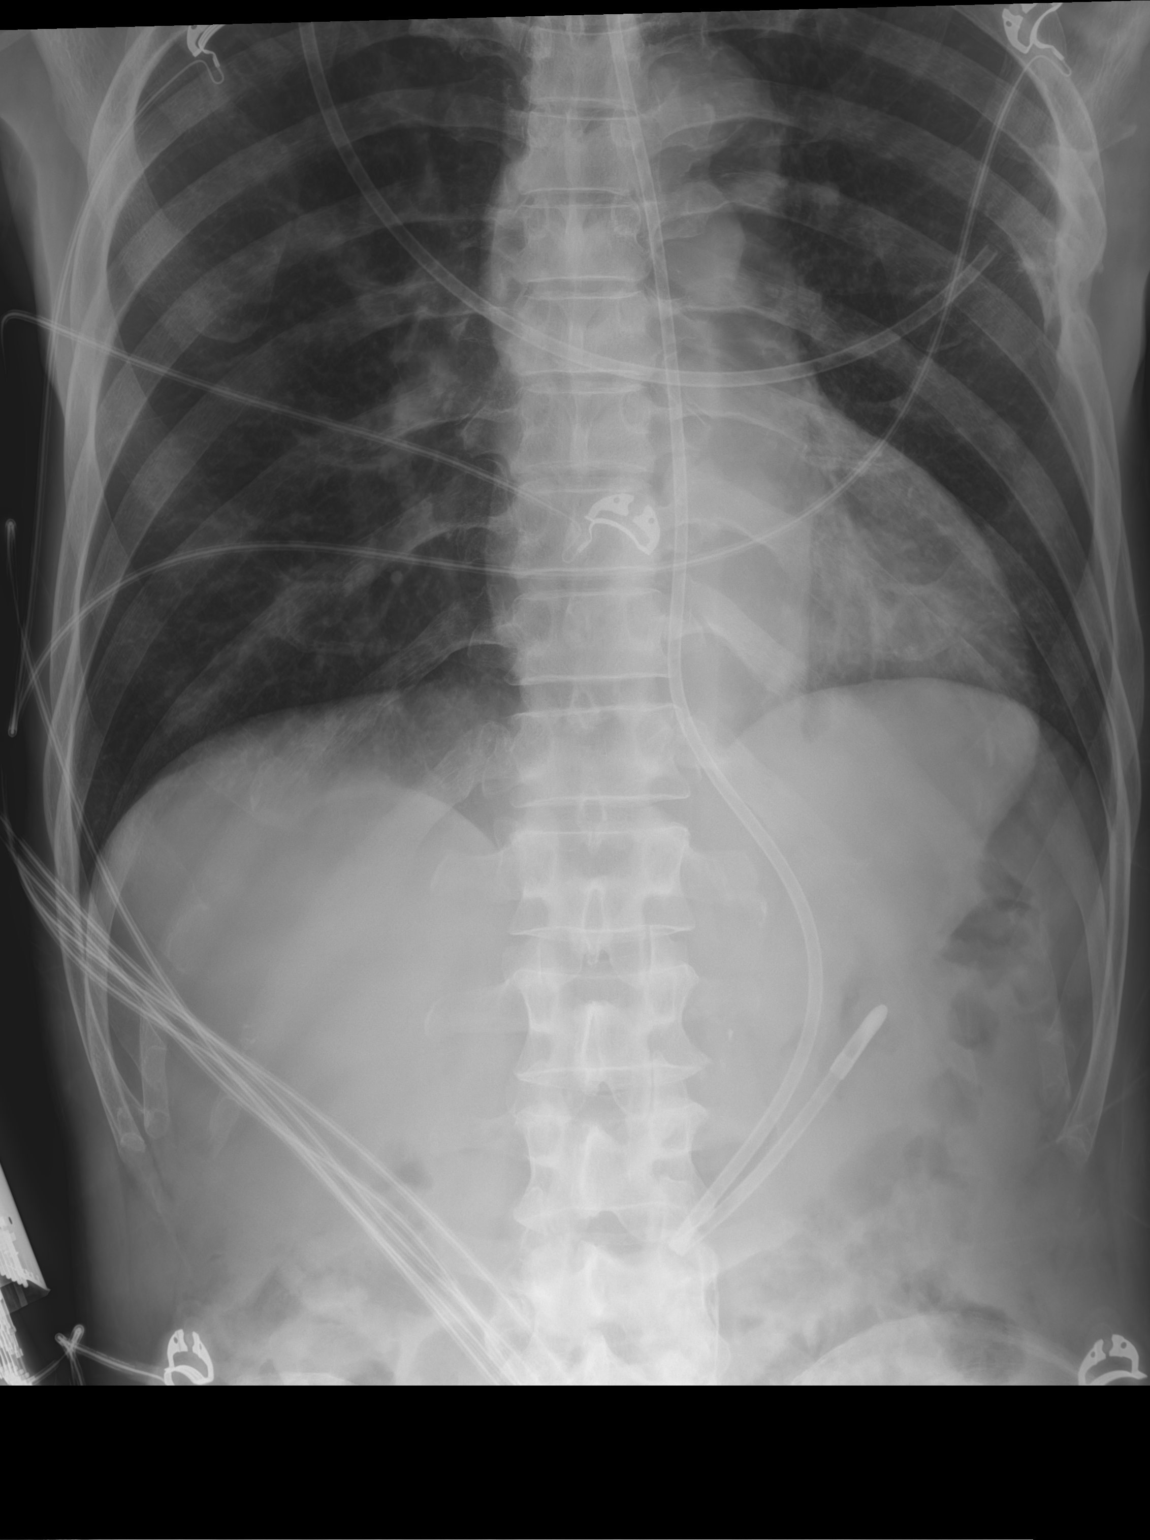

[1 of 1 positions shown; findings below may reference images not displayed]

FINDINGS: Feeding tube is identified slightly coiled in the body of stomach.
Bowel gas pattern is normal.
IMPRESSION: Feeding tube is identified slightly coiled in the body of stomach.

## 2020-11-20 MED ORDER — ENSURE ENLIVE PO LIQD
237.0000 mL | Freq: Three times a day (TID) | ORAL | Status: DC
  Administered 2020-11-20 – 2020-12-09 (×42): 237 mL via ORAL

## 2020-11-20 NOTE — Progress Notes (Addendum)
Paged internal medicine to give results of abdominal xray and for further instruction  1227 spoke with internal medicine. Verbal orders given to leave cortrak clamped at this time and they will round on patient shortly.

## 2020-11-20 NOTE — Progress Notes (Signed)
Provider paged about patients abd [place] results regarding cortrak placement. Provider made aware that the reason for [place] was d/t staff unable to push anything through the tube (meds and tube feedings)  Dietitian unavailable overnight to come and assess.   Per Provider, plan to leave in for now and see if the Dietitian can come in the morning. Since it is the weekend if no one is able to come till Monday, plan is to remove tube.

## 2020-11-20 NOTE — Progress Notes (Addendum)
Initial Nutrition Assessment  DOCUMENTATION CODES:   Not applicable  INTERVENTION:   Ensure Enlive po TID, each supplement provides 350 kcal and 20 grams of protein  Magic cup TID with meals, each supplement provides 290 kcal and 9 grams of protein  MVI with minerals daily  Feeding assistance  Switch to appropriate for room service with assistance  NUTRITION DIAGNOSIS:   Increased nutrient needs related to wound healing as evidenced by estimated needs.  GOAL:   Patient will meet greater than or equal to 90% of their needs  MONITOR:   PO intake,Supplement acceptance,Diet advancement,Weight trends,Labs,I & O's,Skin  REASON FOR ASSESSMENT:   Consult Enteral/tube feeding initiation and management  ASSESSMENT:    Pt admitted with acute metabolic encephalopathy 2/2 multiple acute/subacute ischemic infarcts. 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   4/15 s/p TEE; Cortrak palced (gastric)  Pt unable to provide history at this time. Discussed pt with RN.   Pt had Cortrak placed yesterday, 4/15, (gastric tip) and tube was functioning well immediately after placement as Cortrak RD was able to push saline flush x2 with no issues. Somehow, between time of placement and the time the RN attempted to administer medications via tube ~ 1 hour later, the tube became kinked and RN was unable to push air or anything else through the tube. Advised RN today to pull back on the tube and obtain new KUB (see previous documentation from today), but, unfortunately, this did not improve the positioning of the Cortrak and the RN was still unable to administer anything via tube. Pt then went for MBS and is now on a dysphagia 2 diet with thin liquids. Will order oral nutrition supplements and monitor for adequacy of po intake. Unsure at this time if plan is to remove Cortrak today or to assess pt's adequacy of intake and have  Cortrak adjusted on Monday if intake is not sufficient. Discussed with RN who is waiting to hear back from MD.   Reviewed weight history. No significant weight changes noted. Unable to perform nutrition-focused physical exam at this time as RD is working remotely. Will attempt to obtain at follow-up.   UOP: 2.2L x24 hours  Medications: mvi with minerals, IV thiamine Labs: Na 146 (H), K+ 3.3 (L)  Diet Order:   Diet Order            DIET DYS 2 Room service appropriate? Yes; Fluid consistency: Thin  Diet effective now                 EDUCATION NEEDS:   No education needs have been identified at this time  Skin:  Skin Assessment: Skin Integrity Issues: Skin Integrity Issues:: Stage II Stage II: medial ervical, medial  buttocks, L knee, L thigh  Last BM:  4/16 type 7  Height:   Ht Readings from Last 1 Encounters:  11/16/20 5\' 7"  (1.702 m)    Weight:   Wt Readings from Last 1 Encounters:  11/20/20 63.1 kg    BMI:  Body mass index is 21.79 kg/m.  Estimated Nutritional Needs:   Kcal:  1900-2100  Protein:  95-105 grams  Fluid:  >1.9L/d    Larkin Ina, MS, RD, LDN RD pager number and weekend/on-call pager number located in [place].

## 2020-11-20 NOTE — Progress Notes (Signed)
Brief Nutrition Note  RD received consult due to Cortrak not functioning (RN reports inability to push anything through the tube, even air). KUB was obtained and showed Cortrak looping on itself within the stomach. Unfortunately, no Cortrak service available on weekends. Per Cortram RD documentation from yesterday (4/15), tube was clamped at 74cm. Advised RN to unclamp tube, pull back to 68cm, and obtain another KUB before attempting to push anything through the tube again. Will advise further if tube continues to not function properly.   Larkin Ina, MS, RD, LDN RD pager number and weekend/on-call pager number located in [place].

## 2020-11-20 NOTE — Progress Notes (Signed)
[facility] Infectious Diseases Follow Up Note  Patient Identification: Patient Name: [name] MRN: [id number] [place] Date: 11/16/2020 11:01 AM Age: 65 y.o.Today's Date: 11/20/2020   Reason for Visit: Follow up on encephalopathy, AIDS, bullous skin lesions   Active Problems:   Acute encephalopathy   Pressure injury of skin   Acute renal failure (HCC)   Traumatic rhabdomyolysis (HCC)   Bullous skin disease   Multiple cerebral infarctions (HCC)   Rectal cancer (HCC)   Acute combined systolic and diastolic heart failure (HCC)   Hyperkalemia   Tongue lesion   Antibiotics: None currently   Lines/Tubes: PIV, Feeding tube   Interval Events: Remains afebrile, CK is downtrending, leukocytosis is downtrending   Assessment Bullous skin lesions (bilateral extensor surface of lower extremities, dorsal surface of rt hand and bilateral perirectal area +/- oral lesions) Differentials: Bullous pemphigoid vs SJS vs  Porphyria cutanea tarda vs  drug-induced vasculitis in the setting of cocaine use versus paraneoplastic causes   Multiple Acute to subacute ischemic infarct plus anoxic changes in MRI Brain   Status post lumbar puncture.  CSF analysis not suggestive of bacterial meningitis.  HSV PCR negative.  CSF VDRL negative.  Crypto antigen negative.  concern of HIV related encephalopathy.  Neurology following  HIV, uncontrolled -transition from [place] to [place] Hepatitis C, untreated   Encephalopathy - in the setting of acute CVA and possiblyHIV encephalopathy  Rhabdomyolysis/AKI-improving Substance abuse (alcohol and cocaine)  History of rectal cancer   Recommendations Would recommend skin biopsy for his bullous lesions given unclear diagnosis  Check GC  Continue Biktarvy  Work up of acute CVA per Neurology, planned for TEE today - will fu results  Pending surgical consult given h/o rectal cancer  Hep C tx to be planned  OP   Dr [name] will follow from Monday.   Rest of the management as per the primary team. Thank you for the consult. Please page with pertinent questions or concerns.  ______________________________________________________________________ Subjective patient seen and examined at the bedside. Lying in bed, does not answer much. He is oriented to self only and continues to say " wala, wala, wala")  Vitals BP (!) 143/80 (BP Location: Right Arm)   Pulse 75   Temp 98.4 F (36.9 C) (Axillary)   Resp 20   Ht 5\' 7"  (1.702 m)   Wt 63.1 kg   SpO2 96%   BMI 21.79 kg/m      Physical Exam Constitutional:  lying in bed, not in acute distress     Comments:   Cardiovascular:     Rate and Rhythm: Normal rate and regular rhythm.     Heart sounds: No murmur heard.   Pulmonary:     Effort: Pulmonary effort is normal.     Comments:   Abdominal:     Palpations: Abdomen is soft.     Tenderness:   Musculoskeletal:        General: No swelling or tenderness.   Skin:    Comments: bullous skin lesions in the left anterior thigh, rt knee and perrectal lesions   Neurological:     General: able to move all extremities   Psychiatric:        Mood and Affect:flat affect   Pertinent Microbiology Results for orders placed or performed during the hospital encounter of 11/16/20  Resp Panel by RT-PCR (Flu A&B, Covid) Nasopharyngeal Swab     Status: None   Collection Time: 11/16/20 11:28 AM   Specimen: Nasopharyngeal Swab; Nasopharyngeal(NP)  swabs in vial transport medium  Result Value Ref Range Status   SARS Coronavirus 2 by RT PCR NEGATIVE NEGATIVE Final    Comment: (NOTE) SARS-CoV-2 target nucleic acids are NOT DETECTED.  The SARS-CoV-2 RNA is generally detectable in upper respiratory specimens during the acute phase of infection. The lowest concentration of SARS-CoV-2 viral copies this assay can detect is 138 copies/mL. A negative result does not preclude SARS-Cov-2 infection and  should not be used as the sole basis for treatment or other patient management decisions. A negative result may occur with  improper specimen collection/handling, submission of specimen other than nasopharyngeal swab, presence of viral mutation(s) within the areas targeted by this assay, and inadequate number of viral copies(<138 copies/mL). A negative result must be combined with clinical observations, patient history, and epidemiological information. The expected result is Negative.  Fact Sheet for Patients:  EntrepreneurPulse.com.au  Fact Sheet for Healthcare Providers:  IncredibleEmployment.be  This test is no t yet approved or cleared by the [place] FDA and  has been authorized for detection and/or diagnosis of SARS-CoV-2 by FDA under an Emergency Use Authorization (EUA). This EUA will remain  in effect (meaning this test can be used) for the duration of the COVID-19 declaration under Section 564(b)(1) of the Act, 21 U.S.C.section 360bbb-3(b)(1), unless the authorization is terminated  or revoked sooner.       Influenza A by PCR NEGATIVE NEGATIVE Final   Influenza B by PCR NEGATIVE NEGATIVE Final    Comment: (NOTE) The Xpert Xpress SARS-CoV-2/FLU/RSV plus assay is intended as an aid in the diagnosis of influenza from Nasopharyngeal swab specimens and should not be used as a sole basis for treatment. Nasal washings and aspirates are unacceptable for Xpert Xpress SARS-CoV-2/FLU/RSV testing.  Fact Sheet for Patients: EntrepreneurPulse.com.au  Fact Sheet for Healthcare Providers: IncredibleEmployment.be  This test is not yet approved or cleared by the [place] FDA and has been authorized for detection and/or diagnosis of SARS-CoV-2 by FDA under an Emergency Use Authorization (EUA). This EUA will remain in effect (meaning this test can be used) for the duration of the COVID-19 declaration  under Section 564(b)(1) of the Act, 21 U.S.C. section 360bbb-3(b)(1), unless the authorization is terminated or revoked.  Performed at [facility], [place] [address], [place], [place] [address]   Culture, blood (routine x 2)     Status: None (Preliminary result)   Collection Time: 11/16/20 11:33 AM   Specimen: BLOOD  Result Value Ref Range Status   Specimen Description BLOOD SITE NOT SPECIFIED  Final   Special Requests   Final    BOTTLES DRAWN AEROBIC AND ANAEROBIC Blood Culture results may not be optimal due to an inadequate volume of blood received in culture bottles   Culture   Final    NO GROWTH 3 DAYS Performed at [facility], [place] [address], [place],  [address]    Report Status PENDING  Incomplete  Culture, blood (routine x 2)     Status: None (Preliminary result)   Collection Time: 11/16/20 11:33 AM   Specimen: BLOOD  Result Value Ref Range Status   Specimen Description BLOOD SITE NOT SPECIFIED  Final   Special Requests   Final    BOTTLES DRAWN AEROBIC AND ANAEROBIC Blood Culture results may not be optimal due to an inadequate volume of blood received in culture bottles   Culture   Final    NO GROWTH 3 DAYS Performed at [facility], [place]  [address], [place], [place] [address]    Report Status PENDING  Incomplete  Fungus Culture With Stain     Status: None (Preliminary result)   Collection Time: 11/17/20  4:31 PM  Result Value Ref Range Status   Fungus Stain Final report  Final    Comment: (NOTE) Performed At: [facility] [place], [place] [id number] Rush Farmer MD [id number]    Fungus (Mycology) Culture PENDING  Incomplete   Fungal Source FLUID  Final    Comment: Performed at [facility], [place] [address], [place], [place] [address]  CSF culture w Stat Gram Stain     Status: None (Preliminary result)   Collection Time: 11/17/20  4:31 PM   Specimen: CSF; Cerebrospinal Fluid  Result Value Ref Range Status    Specimen Description CSF  Final   Special Requests NONE  Final   Gram Stain NO WBC SEEN NO ORGANISMS SEEN CYTOSPIN SMEAR   Final   Culture   Final    NO GROWTH 2 DAYS Performed at [facility], [place] [address], [place],  [address]    Report Status PENDING  Incomplete  Fungus Culture Result     Status: None   Collection Time: 11/17/20  4:31 PM  Result Value Ref Range Status   Result 1 Comment  Final    Comment: (NOTE) KOH/Calcofluor preparation:  no fungus observed. Performed At: [facility] [place], [place] [id number] Rush Farmer MD [id number]     Pertinent Lab. CBC Latest Ref Rng & Units 11/20/2020 11/19/2020 11/18/2020  WBC 4.0 - 10.5 K/uL 10.6(H) 11.2(H) 14.2(H)  Hemoglobin 13.0 - 17.0 g/dL 13.9 13.2 14.0  Hematocrit 39.0 - 52.0 % 44.3 41.6 43.5  Platelets 150 - 400 K/uL 234 217 230   CMP Latest Ref Rng & Units 11/20/2020 11/19/2020 11/19/2020  Glucose 70 - 99 mg/dL 88 131(H) 110(H)  BUN 8 - 23 mg/dL 18 24(H) 27(H)  Creatinine 0.61 - 1.24 mg/dL 0.98 1.05 1.06  Sodium 135 - 145 mmol/L 146(H) 145 149(H)  Potassium 3.5 - 5.1 mmol/L 3.3(L) 3.9 3.5  Chloride 98 - 111 mmol/L 115(H) 118(H) 119(H)  CO2 22 - 32 mmol/L 22 23 23   Calcium 8.9 - 10.3 mg/dL 8.2(L) 7.7(L) 8.1(L)  Total Protein 6.5 - 8.1 g/dL - - 5.9(L)  Total Bilirubin 0.3 - 1.2 mg/dL - - 0.6  Alkaline Phos 38 - 126 U/L - - 50  AST 15 - 41 U/L - - 102(H)  ALT 0 - 44 U/L - - 65(H)     Pertinent Imaging today Plain films and CT images have been personally visualized and interpreted; radiology reports have been reviewed. Decision making incorporated into the Impression / Recommendations.  TTE 11/17/20 IMPRESSIONS  1. Left ventricular ejection fraction, by estimation, is 45 to 50%. The  left ventricle has mildly decreased function. The left ventricle  demonstrates regional wall motion abnormalities (see scoring  diagram/findings for description). There is hypokinesis  of  the anterior and anteroseptal LV wall segments. There is moderate  concentric left ventricular hypertrophy. Left ventricular diastolic  parameters are consistent with Grade I diastolic dysfunction (impaired  relaxation).  2. Right ventricular systolic function is normal. The right ventricular  size is normal. There is normal pulmonary artery systolic pressure.  3. Moderate pleural effusion in the left lateral region.  4. The mitral valve is normal in structure. Trivial mitral valve  regurgitation.  5. The aortic valve is tricuspid. There is  mild calcification of the  aortic valve. There is mild thickening of the aortic valve. Aortic valve  regurgitation is mild. Mild aortic valve sclerosis is present, with no  evidence of aortic valve stenosis.  6. The inferior vena cava is normal in size with greater than 50%  respiratory variability, suggesting right atrial pressure of 3 mmHg.  7. There is mildly reduced LVEF with anterior and anteroseptal wall  motion abnormalities concerning for possible LAD lesion.   MRI brain WO contrast 11/16/20 IMPRESSION: 1. 2.6 area of acute to early subacute ischemic infarct involving the right occipital pole, corresponding with abnormality on prior CT. Associated mild petechial hemorrhage without frank hemorrhagic transformation or significant mass effect. 2. Multiple additional scattered acute to early subacute ischemic infarcts involving the cortical and subcortical aspect of both cerebral and cerebellar hemispheres. Overall, a central thromboembolic etiology is suspected given the various vascular distributions involved. 3. Additional abnormal symmetric restricted diffusion involving the globus pallidi bilaterally. Appearance is most characteristic of a superimposed/concomitant hypoxic ischemic injury versus toxic metabolic derangement. 4. Restricted diffusion involving the mesial left temporal lobe with involvement of the left hippocampus. While  this finding may be ischemic in nature as well, superimposed changes of seizure and/or limbic encephalitis could also be considered. Correlation with EEG recommended. 5. Underlying age-related cerebral atrophy with moderate chronic microvascular ischemic disease.  Mri neck 11/19/20 IMPRESSION: The bilateral common carotid and internal carotid arteries are patent within the neck without hemodynamically significant stenosis. Mild atherosclerotic plaque within the distal right common carotid artery.  The vertebral arteries are codominant and patent within the neck with antegrade flow bilaterally. Moderate/severe focal stenosis within the right vertebral artery V1 segment.   I have spent approx 30 minutes for this patient encounter including review of prior medical records, coordination of care  with greater than 50% of time being face to face/counseling and discussing diagnostics/treatment plan with the patient/family.  Electronically signed by:   Rosiland Oz, MD Infectious Disease Physician [facility] for Infectious Disease Pager: [phone]

## 2020-11-20 NOTE — Progress Notes (Signed)
Subjective: No acute overnight events.  This morning, [name] is asking for "something to eat." He continues to open his eyes spontaneously, tracking providers throughout encounter. However, he does not state his name when asked. Indicates no pain, discomfort by shaking his head.   Objective: Vital signs in last 24 hours: Vitals:   11/20/20 0751 11/20/20 1246  BP: (!) 143/80 138/67  Pulse: 75 64  Resp: 20 18  Temp: 98.4 F (36.9 C) 97.8 F (36.6 C)  SpO2: 96% 100%  On room air  General:Awake; responsive and able to request food, track objects, follow commands; no acute distress  Eyes: symmetric, aligned, tracking objects,PERRL, EOM intact, no scleral icterus Cardiovascular:Regular, rate rhythm; no murmurs, rubs, gallops Extremities:No lower extremity edema Skin:Tense bullae overlying extensor surfaces of hands, wrists, lower extremities bilaterally; overlying wound dressings over both knees  Neuro:Follows commands; moves right, left fingers on command; moves right toes on command; symmetric strength of hands on finger squeeze; no facial asymmetry; no tremor, atrophy, or seizure activity GCS: 14  Eye score: 4  Verbal score: 4 Motor score: 6   Intake/Output Summary (Last 24 hours) at 11/20/2020 1335 Last data filed at 11/20/2020 1009 Gross per 24 hour  Intake 678.43 ml  Output 2700 ml  Net -2021.57 ml   Filed Weights   11/18/20 0650 11/19/20 0500 11/20/20 0412  Weight: 61.7 kg 62.3 kg 63.1 kg   Labs in last 24 hours: CBC Latest Ref Rng & Units 11/20/2020 11/19/2020 11/18/2020  WBC 4.0 - 10.5 K/uL 10.6(H) 11.2(H) 14.2(H)  Hemoglobin 13.0 - 17.0 g/dL 13.9 13.2 14.0  Hematocrit 39.0 - 52.0 % 44.3 41.6 43.5  Platelets 150 - 400 K/uL 234 217 230  MCV: 93.9 RDW: 14.5  CMP Latest Ref Rng & Units 11/20/2020 11/19/2020 11/19/2020  Glucose 70 - 99 mg/dL 88 131(H) 110(H)  BUN 8 - 23 mg/dL 18 24(H) 27(H)  Creatinine 0.61 - 1.24 mg/dL 0.98 1.05 1.06  Sodium 135 - 145  mmol/L 146(H) 145 149(H)  Potassium 3.5 - 5.1 mmol/L 3.3(L) 3.9 3.5  Chloride 98 - 111 mmol/L 115(H) 118(H) 119(H)  CO2 22 - 32 mmol/L 22 23 23   Calcium 8.9 - 10.3 mg/dL 8.2(L) 7.7(L) 8.1(L)  Total Protein 6.5 - 8.1 g/dL - - 5.9(L)  Total Bilirubin 0.3 - 1.2 mg/dL - - 0.6  Alkaline Phos 38 - 126 U/L - - 50  AST 15 - 41 U/L - - 102(H)  ALT 0 - 44 U/L - - 65(H)  Mg: 2.0 Phosphorus: 2.8  CK - 1,553 from 2,491  3,742  5,920  8,609  9,003  INR - 1.0 Urine porphobilinogen - in process Plasma porphyrins - in process Vitamin B1 - in process  Imaging in Last 24 Hours:  DG Abd Portable 1V - Feeding tube loops in the stomach with the tip in the mid stomach - No other abnormalities   ECHO TEE 1. Left ventricular ejection fraction, by estimation, is 45 to 50%. The left ventricle has mildly decreased function.  2. Right ventricular systolic function is normal. The right ventricular size is normal. 3. No left atrial/left atrial appendage thrombus was detected.  4. The mitral valve is grossly normal. Trivial mitral valve regurgitation.  5. The aortic valve is normal in structure. Aortic valve regurgitation is mild. No aortic stenosis is present. 6. Agitated saline contrast bubble study was positive with shunting observed after >6 cardiac cycles suggestive of intrapulmonary shunting. Conclusion(s)/Recommendation(s): No LA/LAA thrombus identified. Negative bubble study for  interatrial shunt. No intracardiac source of embolism detected on this on this transesophageal echocardiogram.  Assessment/Plan:  Active Problems:   Acute encephalopathy   Pressure injury of skin   Acute renal failure (HCC)   Traumatic rhabdomyolysis (HCC)   Bullous skin disease   Multiple cerebral infarctions (HCC)   Rectal cancer (HCC)   Acute combined systolic and diastolic heart failure (HCC)   Hyperkalemia   Tongue lesion  [name] is a 65 year old man with a history of AIDS and substance use admitted with  acute encephalopathy found to have numerous new acute/subacute ischemic infarcts on MRI Brain, concern for HSV encephalitis and severe AKI with multiple metabolic abnormalities.  #Acute encephalopathy #Multiple acute/subacute ischemic infarcts Patient's encephalopathy appears to be stable from yesterday with management described below, however he remains far from his baseline. 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 TEE did not demonstrate intracardiac embolic source.  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 Patient has not received Carvedilol or Losartan for as his Cortrak is not properly placed and will likely be fixed on Monday (04/18). Patient does not exhibit signs of volume overload on examination. -Cardiology managing -Following Cortrak adjustment, start carvedilol 3.125mg  twice daily, titrate up as tolerated -Following Cortrak adjustment, start losartan 25mg  daily, titrate up as tolerated   #Rhabdomyolysis Patient's CK continues to improve with IVF. Patient will benefit from SLP evaluation to determine if he is able to eat and drink, and, if so, patient may not need further IVF. -f/u SLP eval and treat -Hold further IVF at this time -Daily CK   #Hypernatremia, resolved Patient's sodium of 141 on admission trended up to 150 and now within normal limits at 145 following D5W. Patient would benefit from close monitoring. -Hold further IVF at this time -Daily BMP  #Bullous Lesions Patient continues to develop additional bullous lesions  since admission. His lesions are localized to the dorsal aspects of the hands, extensor surfaces of the legs and perianal region. Differential for patient's lesions includes bullous pemphigoid, levamisole-induced vasculitis, drug reaction, or porphyria cutanea tarda. Patient would benefit from punch biopsy to evaluate for cause of these lesions. Attempts to contact family for consent to biopsy have been unsuccessful. Will hold off on treatment at this time. -Punch biopsy, following consent -Continue to monitor for additional lesions -Avoidance of cocaine  #AIDS -ID managing -Start Biktarvy following Cortrak adjustment    LOS: 4 days   Gwynneth Albright, Medical Student 11/20/2020, 1:35 PM

## 2020-11-20 NOTE — Progress Notes (Signed)
Spoke with on call dietician. Suggested pulling cortrak out to 68cm to possibly un-loop tubing in the stomach. Recommended repeating KUB before using tube.

## 2020-11-20 NOTE — Evaluation (Signed)
Clinical/Bedside Swallow Evaluation Patient Details  Name: [name] MRN: [id number] Date of Birth: 09/01/1955  Today's Date: 11/20/2020 Time: SLP Start Time (ACUTE ONLY): 1347 SLP Stop Time (ACUTE ONLY): 1358 SLP Time Calculation (min) (ACUTE ONLY): 11 min  Past Medical History:  Past Medical History:  Diagnosis Date  . Acute kidney failure    unspec, secondary to medications  . AIDS ([place]) 06/07/2016  . Alcohol abuse 05/03/2016  . anal ca dx'd 02/2011  . Cachexia ([place])   . 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 Drug abuse ([place])   . GERD (gastroesophageal reflux disease) 08/09/2016  . Grieving 11/29/2017  . Hemorrhoid   . Hepatitis C   . History of radiation therapy 01/01/2011 -05/18/11   anal, pelvis, inguinal lymph nodes  . HIV (human immunodeficiency virus infection) ([place])   . HIV or AIDS   . Housing problems 10/22/2020  . Hypertension   . Loose stools 06/07/2016  . Mass of anus 01/26/2011   squamous cell cancer  . Meningitis   . Mood disorder ([place])   . Pain    right leg  . Pulmonary nodule   . Rash and nonspecific skin eruption 03/31/2015  . Rectal bleeding 08/09/2020  . Testicular hypofunction    Past Surgical History:  Past Surgical History:  Procedure Laterality Date  . MASS EXCISION  01/26/2011   squamous cell carcinoma   HPI:  [name] is a 65 y.o. 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 He was found to be encephalopathic at attended with acute renal failure and rhabdomyolysis.  He also has new bullous skin lesions on hands and legs. Pt was found to have multiple infarcts and anoxic changes on MRI 4/12.  CXR 4/12 with no acute findings.   Assessment / Plan / Recommendation Clinical Impression  Pt presents with clinical indicators of pharyngeal dysphagia.  Pt exhibited immediate wet, prolonged cough with thin liquid by straw.   Nectar thick liquid elimited coughing, but pt still required multiple swallows.  Pt tolerated puree and exhibited good oral clearance.  With regular solid, there was prolonged oral phase.  Pt benefited from use of puree to moisten solid to achieve clearance of bolus. Cortrak placed, but not functioning at this time.  Pt would benefit from MBSS prior to initiaiton of PO diet. Will plan for instrumental assessment later this date. SLP Visit Diagnosis: Dysphagia, oropharyngeal phase (R13.12)    Aspiration Risk  Moderate aspiration risk    Diet Recommendation NPO;Alternative means - temporary   Medication Administration: Via alternative means    Other  Recommendations     Follow up Recommendations  (TBD)      Frequency and Duration  (TBD)          Prognosis Prognosis for Safe Diet Advancement:  (TBD)      Swallow Study   General Date of Onset: 11/16/20 HPI: [name] is a 65 y.o. 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 He was found to be encephalopathic at attended with acute renal failure and rhabdomyolysis.  He also has new bullous skin lesions on hands and legs. Pt was found to have multiple infarcts and anoxic changes on MRI 4/12.  CXR 4/12 with no acute findings. Type of Study:  Bedside Swallow Evaluation Diet Prior to this Study: NPO Temperature Spikes Noted: No History of Recent Intubation: No Behavior/Cognition: Alert;Cooperative Oral Cavity Assessment: Within Functional Limits Oral Care Completed by SLP: No Oral Cavity - Dentition: Missing dentition Self-Feeding Abilities: Needs assist Patient Positioning: Upright in bed Baseline Vocal Quality: Low vocal intensity Volitional Cough: Weak Volitional Swallow: Able to elicit    Oral/Motor/Sensory Function Overall Oral Motor/Sensory Function: Moderate impairment Facial ROM:  Reduced right;Reduced left (reduced rounding) Facial Symmetry: Within Functional Limits Lingual ROM: Within Functional Limits Lingual Symmetry:  (decreased protrusion) Lingual Strength: Reduced Velum: Within Functional Limits Mandible: Within Functional Limits   Ice Chips Ice chips: Not tested   Thin Liquid Thin Liquid: Impaired Presentation: Cup;Straw Pharyngeal  Phase Impairments: Cough - Immediate    Nectar Thick Nectar Thick Liquid: Impaired Presentation: Cup;Straw Pharyngeal Phase Impairments: Multiple swallows   Honey Thick     Puree Puree: Within functional limits Presentation: Spoon   Solid     Solid: Impaired Presentation:  (SLP fed) Oral Phase Impairments: Impaired mastication Oral Phase Functional Implications: Prolonged oral transit      Celedonio Savage , [place], [place] Office: [phone]; Pager [phone]): [phone] 11/20/2020,2:09 PM

## 2020-11-20 NOTE — Progress Notes (Signed)
Modified Barium Swallow Progress Note  Patient Details  Name: [name] MRN: [id number] Date of Birth: Dec 31, 1955  Today's Date: 11/20/2020  Modified Barium Swallow completed.  Full report located under Chart Review in the Imaging Section.  Brief recommendations include the following:  Clinical Impression  Pt presents with mild oropharyngeal dysphagia c/b prolonged mastication, premature spillage, delayed swallow initiation, and reduced base of tongue retraction.  These deficits resulted in trace, transient penetration of thin liquid by straw prior to the swallow.  Cup sips, including serial cup sips, prevented penetration of liquids.  There was no penetration or aspiration of any other consistencies trialed.  There was frequent premature spillage with all consistencies to the level of the pyriform sinuses prior to the swallow with bolus remaining in pharynx for period of time increasing with advancing texture of bolus. There was mild vallecula residue with solids. With solids, frontal munching pattern noted with prolonged oral phase and decreased bolus cohesion.   Recommend chopped/ground (dysphagia 2) diet with thin liuqids by cup only.   Swallow Evaluation Recommendations       SLP Diet Recommendations: Dysphagia 2 (Fine chop) solids;Thin liquid   Liquid Administration via: Cup;No straw   Medication Administration: Crushed with puree       Compensations: Slow rate;Small sips/bites;Minimize environmental distractions   Postural Changes: Seated upright at 90 degrees   Oral Care Recommendations: Oral care BID        Celedonio Savage, [place], [place] Office: [phone] 11/20/2020,4:22 PM

## 2020-11-20 NOTE — Progress Notes (Signed)
Paged on call dietician for direction of cortrak not functioning.

## 2020-11-20 NOTE — Plan of Care (Signed)
Patient is currently resting in bed. Denies pain. Patient asking for water, stating he is thirsty. Oral care provided and swabs given. Patient tolerating swabs. VSS. Incont of stool multiple times, drsg changed on bottom. Q2 turns. Call bell within reach. Bed alarm on.   Problem: Education: Goal: Knowledge of General Education information will improve Description: Including pain rating scale, medication(s)/side effects and non-pharmacologic comfort measures Outcome: Progressing   Problem: Clinical Measurements: Goal: Ability to maintain clinical measurements within normal limits will improve Outcome: Progressing Goal: Will remain free from infection Outcome: Progressing Goal: Diagnostic test results will improve Outcome: Progressing Goal: Respiratory complications will improve Outcome: Progressing Goal: Cardiovascular complication will be avoided Outcome: Progressing   Problem: Activity: Goal: Risk for activity intolerance will decrease Outcome: Progressing   Problem: Nutrition: Goal: Adequate nutrition will be maintained Outcome: Progressing   Problem: Coping: Goal: Level of anxiety will decrease Outcome: Progressing   Problem: Elimination: Goal: Will not experience complications related to bowel motility Outcome: Progressing Goal: Will not experience complications related to urinary retention Outcome: Progressing   Problem: Pain Managment: Goal: General experience of comfort will improve Outcome: Progressing   Problem: Safety: Goal: Ability to remain free from injury will improve Outcome: Progressing   Problem: Skin Integrity: Goal: Risk for impaired skin integrity will decrease Outcome: Progressing   Problem: Nutrition: Goal: Risk of aspiration will decrease Outcome: Progressing Goal: Dietary intake will improve Outcome: Progressing

## 2020-11-20 NOTE — Progress Notes (Signed)
Brief Nutrition Note  RD received consult due to Cortrak not functioning (RN reports inability to push anything through the tube, even air). KUB was obtained and showed Cortrak looping on itself within the stomach. Unfortunately, no Cortrak service available on weekends. Per Cortrak RD documentation from yesterday (4/15), tube was clamped at 74cm. Advised RN to unclamp tube, pull back to 68cm, and obtain another KUB before attempting to push anything through the tube again. Will advise further if tube continues to not function properly.   Larkin Ina, MS, RD, LDN RD pager number and weekend/on-call pager number located in [place].

## 2020-11-20 NOTE — Progress Notes (Signed)
Patient had continuous bright yellow, thin liquid, bowel movement. Rectal pouch placed and sacral mepilex replaced. Patient tolerated ok.

## 2020-11-20 NOTE — Progress Notes (Signed)
Patient bladder scanned around 2030, amount 453cc. Provider paged, spoke with on call resident. I&O cath order placed. Patient I&O cathed from 51cc.   0300 Patient unable to void. Bladder scanned for 394cc. Paged provider. Order put in to place foley catheter.   16Fr Catheter placed, 10 cc balloon.

## 2020-11-21 ENCOUNTER — Encounter (HOSPITAL_COMMUNITY): Payer: Self-pay | Admitting: Internal Medicine

## 2020-11-21 DIAGNOSIS — I639 Cerebral infarction, unspecified: Secondary | ICD-10-CM | POA: Diagnosis not present

## 2020-11-21 DIAGNOSIS — L139 Bullous disorder, unspecified: Secondary | ICD-10-CM | POA: Diagnosis not present

## 2020-11-21 DIAGNOSIS — C20 Malignant neoplasm of rectum: Secondary | ICD-10-CM | POA: Diagnosis not present

## 2020-11-21 DIAGNOSIS — G9340 Encephalopathy, unspecified: Secondary | ICD-10-CM | POA: Diagnosis not present

## 2020-11-21 DIAGNOSIS — F1410 Cocaine abuse, uncomplicated: Secondary | ICD-10-CM

## 2020-11-21 LAB — CBC WITH DIFFERENTIAL/PLATELET
Abs Immature Granulocytes: 0.88 10*3/uL — ABNORMAL HIGH (ref 0.00–0.07)
Basophils Absolute: 0.1 10*3/uL (ref 0.0–0.1)
Basophils Relative: 1 %
Eosinophils Absolute: 0.1 10*3/uL (ref 0.0–0.5)
Eosinophils Relative: 1 %
HCT: 41.6 % (ref 39.0–52.0)
Hemoglobin: 13.1 g/dL (ref 13.0–17.0)
Immature Granulocytes: 7 %
Lymphocytes Relative: 9 %
Lymphs Abs: 1.2 10*3/uL (ref 0.7–4.0)
MCH: 29.2 pg (ref 26.0–34.0)
MCHC: 31.5 g/dL (ref 30.0–36.0)
MCV: 92.9 fL (ref 80.0–100.0)
Monocytes Absolute: 1.2 10*3/uL — ABNORMAL HIGH (ref 0.1–1.0)
Monocytes Relative: 9 %
Neutro Abs: 9.9 10*3/uL — ABNORMAL HIGH (ref 1.7–7.7)
Neutrophils Relative %: 73 %
Platelets: 250 10*3/uL (ref 150–400)
RBC: 4.48 MIL/uL (ref 4.22–5.81)
RDW: 14.5 % (ref 11.5–15.5)
WBC: 13.3 10*3/uL — ABNORMAL HIGH (ref 4.0–10.5)
nRBC: 0 % (ref 0.0–0.2)

## 2020-11-21 LAB — CULTURE, BLOOD (ROUTINE X 2)
Culture: NO GROWTH
Culture: NO GROWTH

## 2020-11-21 LAB — BASIC METABOLIC PANEL
Anion gap: 10 (ref 5–15)
BUN: 19 mg/dL (ref 8–23)
CO2: 21 mmol/L — ABNORMAL LOW (ref 22–32)
Calcium: 8.3 mg/dL — ABNORMAL LOW (ref 8.9–10.3)
Chloride: 115 mmol/L — ABNORMAL HIGH (ref 98–111)
Creatinine, Ser: 1.01 mg/dL (ref 0.61–1.24)
GFR, Estimated: >60 mL/min (ref >60)
Glucose, Bld: 103 mg/dL — ABNORMAL HIGH (ref 70–99)
Potassium: 3.6 mmol/L (ref 3.5–5.1)
Sodium: 146 mmol/L — ABNORMAL HIGH (ref 135–145)

## 2020-11-21 LAB — CSF CULTURE W GRAM STAIN
Culture: NO GROWTH
Gram Stain: NONE SEEN

## 2020-11-21 LAB — PROTIME-INR
INR: 1 (ref 0.8–1.2)
Prothrombin Time: 12.7 seconds (ref 11.4–15.2)

## 2020-11-21 LAB — GLUCOSE, CAPILLARY: Glucose-Capillary: 113 mg/dL — ABNORMAL HIGH (ref 70–99)

## 2020-11-21 LAB — CK: Total CK: 837 U/L — ABNORMAL HIGH (ref 49–397)

## 2020-11-21 MED ORDER — ATORVASTATIN CALCIUM 40 MG PO TABS
40.0000 mg | ORAL_TABLET | Freq: Every day | ORAL | Status: DC
  Administered 2020-11-21 – 2020-12-03 (×12): 40 mg via ORAL
  Filled 2020-11-21 (×12): qty 1

## 2020-11-21 MED ORDER — LACTATED RINGERS IV BOLUS
1000.0000 mL | Freq: Once | INTRAVENOUS | Status: AC
  Administered 2020-11-21: 1000 mL via INTRAVENOUS

## 2020-11-21 MED ORDER — CLOPIDOGREL BISULFATE 75 MG PO TABS
75.0000 mg | ORAL_TABLET | Freq: Every day | ORAL | Status: DC
  Administered 2020-11-21 – 2020-11-26 (×6): 75 mg via ORAL
  Filled 2020-11-21 (×6): qty 1

## 2020-11-21 NOTE — Plan of Care (Signed)
VSS. Q2 turns. Foley intact. Rectal pouch on, pt may need a flexiseal. Tolerating diet. Provider paged this AM about stool sample, apparently GI stool panel was collected during day shift yesterday and is in process. Call bell within reach. Bed alarm on.  Problem: Education: Goal: Knowledge of General Education information will improve Description: Including pain rating scale, medication(s)/side effects and non-pharmacologic comfort measures Outcome: Progressing   Problem: Health Behavior/Discharge Planning: Goal: Ability to manage health-related needs will improve Outcome: Progressing   Problem: Clinical Measurements: Goal: Ability to maintain clinical measurements within normal limits will improve Outcome: Progressing Goal: Will remain free from infection Outcome: Progressing Goal: Diagnostic test results will improve Outcome: Progressing Goal: Respiratory complications will improve Outcome: Progressing Goal: Cardiovascular complication will be avoided Outcome: Progressing   Problem: Activity: Goal: Risk for activity intolerance will decrease Outcome: Progressing   Problem: Nutrition: Goal: Adequate nutrition will be maintained Outcome: Progressing   Problem: Coping: Goal: Level of anxiety will decrease Outcome: Progressing   Problem: Elimination: Goal: Will not experience complications related to bowel motility Outcome: Progressing Goal: Will not experience complications related to urinary retention Outcome: Progressing   Problem: Pain Managment: Goal: General experience of comfort will improve Outcome: Progressing   Problem: Safety: Goal: Ability to remain free from injury will improve Outcome: Progressing   Problem: Skin Integrity: Goal: Risk for impaired skin integrity will decrease Outcome: Progressing   Problem: Education: Goal: Knowledge of disease or condition will improve Outcome: Progressing Goal: Knowledge of secondary prevention will  improve Outcome: Progressing Goal: Knowledge of patient specific risk factors addressed and post discharge goals established will improve Outcome: Progressing Goal: Individualized Educational Video(s) Outcome: Progressing

## 2020-11-21 NOTE — Progress Notes (Addendum)
Subjective: No acute overnight events.  He is still mostly nonverbal although he does nod his head and shake his head no appropriately.  Did state that his bandages were applied today and responded with yes when discussing ongoing management of his clinical condition.  Objective: Vital signs in last 24 hours: Vitals:   11/21/20 0745 11/21/20 1424  BP: 116/76 133/62  Pulse: 76 89  Resp: 20 18  Temp: 97.8 F (36.6 C) 98.7 F (37.1 C)  SpO2: 94% 98%   General: Chronically ill-appearing elderly male lying in bed, able to track objects, follow simple commands, NAD. Eyes: Symmetric, aligned, tracking objects, PERRL, EOM intact. Cardiovascular: Regular rate normal rhythm, no murmurs rubs or gallops. Skin: Tense bullae overlying extensor surfaces of hands wrists lower extremities bilaterally; wound dressings applied over both knees. Neuro: Follows commands, moves right and left fingers on command, moves right toes on command, symmetric hand squeeze strength.  Mental status does appear slightly improved since yesterday.   Intake/Output Summary (Last 24 hours) at 11/21/2020 1551 Last data filed at 11/21/2020 1100 Gross per 24 hour  Intake 290.7 ml  Output 650 ml  Net -359.3 ml   Filed Weights   11/18/20 0650 11/19/20 0500 11/20/20 0412  Weight: 61.7 kg 62.3 kg 63.1 kg   Labs in last 24 hours: CBC Latest Ref Rng & Units 11/21/2020 11/20/2020 11/19/2020  WBC 4.0 - 10.5 K/uL 13.3(H) 10.6(H) 11.2(H)  Hemoglobin 13.0 - 17.0 g/dL 13.1 13.9 13.2  Hematocrit 39.0 - 52.0 % 41.6 44.3 41.6  Platelets 150 - 400 K/uL 250 234 217    CMP Latest Ref Rng & Units 11/21/2020 11/20/2020 11/19/2020  Glucose 70 - 99 mg/dL 103(H) 88 131(H)  BUN 8 - 23 mg/dL 19 18 24(H)  Creatinine 0.61 - 1.24 mg/dL 1.01 0.98 1.05  Sodium 135 - 145 mmol/L 146(H) 146(H) 145  Potassium 3.5 - 5.1 mmol/L 3.6 3.3(L) 3.9  Chloride 98 - 111 mmol/L 115(H) 115(H) 118(H)  CO2 22 - 32 mmol/L 21(L) 22 23  Calcium 8.9 - 10.3 mg/dL  8.3(L) 8.2(L) 7.7(L)  Total Protein 6.5 - 8.1 g/dL - - -  Total Bilirubin 0.3 - 1.2 mg/dL - - -  Alkaline Phos 38 - 126 U/L - - -  AST 15 - 41 U/L - - -  ALT 0 - 44 U/L - - -    Assessment/Plan:  Active Problems:   Acute encephalopathy   Pressure injury of skin   Acute renal failure (HCC)   Traumatic rhabdomyolysis (HCC)   Bullous skin disease   Multiple cerebral infarctions (HCC)   Rectal cancer (HCC)   Acute combined systolic and diastolic heart failure (HCC)   Hyperkalemia   Tongue lesion  [name] is a 65 year old man with a history of AIDS and substance use admitted with acute encephalopathy found to have numerous new acute/subacute ischemic infarcts on MRI Brain and severe rhabdomyolysis with multiple metabolic abnormalities.  #Acute encephalopathy #Multiple acute/subacute ischemic infarcts Patient's encephalopathy appears to be slightly improved from yesterday with management described below, however he remains far from his baseline. 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 TEE did not demonstrate intracardiac embolic source. Patient has passed speech and swallow eval and given dysphagia 2 diet.  Can remove cortrak once tolerating p.o. intake. As per neurology, started aspirin and Plavix for 3 weeks and then aspirin  alone, and continue statin therapy. Per neuro, may consider 30-day cardiac event monitoring as outpatient if able to be arranged. -Neurology with plan for outpatient follow-up at stroke clinic with Dr. 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  Started carvedilol 3.125 mg twice daily and losartan 25 mg  daily (titrate both up as needed). Cardiology managing, appreciate assistance.  #Rhabdomyolysis Patient's CK continues to improve with IVF, now 837. -Hold further IVF at this time -Daily CK   #Hypernatremia, resolved Patient's sodium of 141 on admission, trended up to 150, now stable at 146.  Continue to trend daily BMP.  #Bullous Lesions No new lesions seen on exam today.  His lesions are localized to the dorsal aspects of the hands, extensor surfaces of the legs and perianal region.  ID following, appreciate recommendations.  Differential for patient's lesions includes bullous pemphigoid, levamisole-induced vasculitis, drug reaction, or porphyria cutanea tarda. Patient would benefit from punch biopsy to evaluate for cause of these lesions. Attempts to contact family for consent to biopsy have been unsuccessful. Will hold off on treatment at this time. -Punch biopsy following consent -Follow-up GC testing -Continue to monitor for additional lesions -Encourage cessation of cocaine  #AIDS -ID managing -Continue Biktarvy   LOS: 5 days   [name], MD 11/21/2020, 3:50 PM

## 2020-11-21 NOTE — Progress Notes (Addendum)
STROKE TEAM PROGRESS NOTE  Patient was admitted 4/12 after being found down at home with hypoxia to 70s. He was obtunded initially during his stay.   MRI showed extensive acute cerebrovascular ischemia with abnl restricted diffusion involving L mesial temporal lobe incl hippocampus ddx incl ischemic +/- superimposed changes of seizure.  ID, cardiology and medicine teams are managing patient.   INTERVAL HISTORY No acute events overnight.  Patient is quite alert today, sitting up in bed watching tv with remote control in his hand. He is soft spoken, answers in 1-2 word responses. Following some commands. Breakfast tray looks like he ate about 25%  No visitors at bedside.   Explained diagnosis and plan of care. He had not questions.   Vitals:   11/20/20 1637 11/20/20 2009 11/20/20 2349 11/21/20 0745  BP: (!) 152/80 132/74 138/60 116/76  Pulse: 77 74 86 76  Resp:  20 (!) 24 20  Temp:  98.8 F (37.1 C) 98.6 F (37 C) 97.8 F (36.6 C)  TempSrc:  Oral Axillary Axillary  SpO2:  100% 95% 94%  Weight:      Height:       CBC:  Recent Labs  Lab 11/20/20 0454 11/21/20 0332  WBC 10.6* 13.3*  NEUTROABS 7.4 9.9*  HGB 13.9 13.1  HCT 44.3 41.6  MCV 93.9 92.9  PLT 234 194   Basic Metabolic Panel:  Recent Labs  Lab 11/20/20 0454 11/20/20 1800 11/21/20 0332  NA 146*  --  146*  K 3.3*  --  3.6  CL 115*  --  115*  CO2 22  --  21*  GLUCOSE 88  --  103*  BUN 18  --  19  CREATININE 0.98  --  1.01  CALCIUM 8.2*  --  8.3*  MG 2.0 2.0  --   PHOS 2.8 3.0  --    Lipid Panel:  Recent Labs  Lab 11/18/20 0112  CHOL 123  TRIG 115  HDL 53  CHOLHDL 2.3  VLDL 23  LDLCALC 47   HgbA1c:  Recent Labs  Lab 11/18/20 0112  HGBA1C 5.5   Urine Drug Screen:  Recent Labs  Lab 11/16/20 1700  LABOPIA NONE DETECTED  COCAINSCRNUR POSITIVE*  LABBENZ NONE DETECTED  AMPHETMU NONE DETECTED  THCU NONE DETECTED  LABBARB NONE DETECTED    Alcohol Level  Recent Labs  Lab 11/16/20 1133  ETH  <10   Imaging/Pertinent diagnostic studies:  MRI 1. 2.6 area of acute to early subacute ischemic infarct involving the right occipital pole, corresponding with abnormality on prior CT. Associated mild petechial hemorrhage without frank hemorrhagic transformation or significant mass effect. 2. Multiple additional scattered acute to early subacute ischemic infarcts involving the cortical and subcortical aspect of both cerebral and cerebellar hemispheres. Overall, a central thromboembolic etiology is suspected given the various vascular distributions involved. 3. Additional abnormal symmetric restricted diffusion involving the globus pallidi bilaterally. Appearance is most characteristic of a superimposed/concomitant hypoxic ischemic injury versus toxic metabolic derangement. 4. Restricted diffusion involving the mesial left temporal lobe with involvement of the left hippocampus. While this finding may be ischemic in nature as well, superimposed changes of seizure and/or limbic encephalitis could also be considered. Correlation with EEG recommended. 5. Underlying age-related cerebral atrophy with moderate chronic microvascular ischemic disease.  CT head Rounded focus of hypoattenuation in the posterior right parietal lobe may be due to an infarct but cannot be definitively characterized. Brain MRI with and without contrast is recommended for further evaluation. Chronic microvascular  ischemic change.  EEG: This study is suggestive ofmild tomoderatediffuse encephalopathy, nonspecific etiology.No seizures or epileptiform discharges were seen throughout the recording.  2D Echo  Echocardiogram 11/17/2020: Impressions: 1. Left ventricular ejection fraction, by estimation, is 45 to 50%. The  left ventricle has mildly decreased function. The left ventricle demonstrates regional wall motion abnormalities (see scoring diagram/findings for description). There is hypokinesis of the anterior and  anteroseptal LV wall segments. There is moderate concentric left ventricular hypertrophy. Left ventricular diastolic parameters are consistent with Grade I diastolic dysfunction (impaired  relaxation).  2. Right ventricular systolic function is normal. The right ventricular size is normal. There is normal pulmonary artery systolic pressure.  3. Moderate pleural effusion in the left lateral region.  4. The mitral valve is normal in structure. Trivial mitral valve regurgitation.  5. The aortic valve is tricuspid. There is mild calcification of the aortic valve. There is mild thickening of the aortic valve. Aortic valve regurgitation is mild. Mild aortic valve sclerosis is present, with no evidence of aortic valve stenosis.  6. The inferior vena cava is normal in size with greater than 50% respiratory variability, suggesting right atrial pressure of 3 mmHg.  7. There is mildly reduced LVEF with anterior and anteroseptal wall motion abnormalities concerning for possible LAD lesion.   Bilat LE doppler BILATERAL:  - No evidence of deep vein thrombosis seen in the lower extremities,  bilaterally.  - No evidence of superficial venous thrombosis in the lower extremities,  bilaterally.  -No evidence of popliteal cyst, bilaterally  PHYSICAL EXAM Gen: Thin chronically ill appearing male sitting up in bed calmly alert.  HEENT: Atraumatic, normocephalic;mucous membranes moist; oropharynx clear, tongue without atrophy or fasciculations. Resp: no extra work of breathing  CV: RRR, Skin: warm and dry with bullous lesions bilat hands and knees   Neuro: MS: Somnolent (sedated). Follows simple commands unreliably, drifts back to sleep, keeps eyes closed. Verbal response consists of moaning.  CN: PERRL 48mm Motor: Moving bilateral upper extremities at least antigravity. Poor grip effort bilaterally. Does not follow commands for formal strength testing reliably. Does not lift BLE off bed to command but does  wiggle toes bilat.  Sensory: Does not answer questions for sensory testing Coordination, gait: UTA 2/2 encephalopathy  ASSESSMENT/PLAN Laster Appling is a 65 y.o. 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 Leukocytosis 18.4 in setting of AIDS (CD4 197) with unknown source. UDS cocaine (+)  Stroke - multifocal bilateral infarcts, etiology unclear, concerning for global hypoxic ischemic injury and cocaine vasculopathy. However, cardioembolic can not be completely ruled out  No endocarditis seen on TEE.  Less likely infectious vasculitis given clear CSF.   VTE prophylaxis - SCDs  May consider 30 day cardiac event monitoring as outpt if able to be arranged.   No anticoagulant/antiplalet medication prior to admission, now on 81mg  ASA and Plavix 75mg  daily x 3 weeks and then ASA alone  Now has passed swallow eval and given dysphagia 2 diet.   Therapy recommendations: TBD pending progress   Disposition:  TBD  Acute encephalopathy, multifactorial  ID following: Antibiotics stopped as meningitis not apparent. Acyclovir stopped as HSV negative.  Lumbar puncture showed clear appearance, 1 WBC. Protein 50, glucose 66,  Culture NG at 2days, neg gram stain, Fungus culture with stain negative at 2D, VDRL  non-reactive, HSV-1 and 2 negative, Cryptococcal antigen negative   Metabolic derangements: rhabdomyolysis, AKI, hyperkalemia, hypernatremia   S/p hypoxic event   Concern for seizure but no seizure seen on EEG as of yet. EEG showing mild tomoderatediffuse encephalopathy, nonspecific etiology.No seizures or epileptiform discharges were seen throughout the recording.  Hypertension  New dx  Cardiology on board:  okay to start ARBs since pt is out of permissive hypertension window.  Avoid low BP.  Marland Kitchen Permissive hypertension (OK if < 220/120) but gradually normalize in 5-7 days . Long-term BP goal normotensive  Hyperlipidemia  Home meds:  None  LDL 47, at goal < 70  High intensity statin: Lipitor 40mg    New Cardiomyopathy with Mildly Reduced EF, NSVT:  EF 45-50%  Management per cardiology team  Other Stroke Risk Factors  Advanced Age >/= 64   Current Cigarette smoker, will be advised to stop smoking  Current ETOH use, alcohol level <10, will be advised to drink no more than 2 drink(s) a day  Substance abuse - UDS:  THC NONE DETECTED, Cocaine POSITIVE. Patient advised to stop using due to stroke risk.  Obesity, Body mass index is 21.79 kg/m., BMI >/= 30 associated with increased stroke risk, recommend weight loss, diet and exercise as appropriate   Congestive heart failure  AIDS: CD4 count of 197, ID managing  Other Active Problems  Bullous lesions: unclear etiology, increasing in number, punch biopsy pending  AKI: Creatinine peaked at 5.2 on admission.  Acute encephalopathy in setting of acute CVA  Rhabdomyolysis   Polysubstance abuse: UDS positive for cocaine this admission.   Hospital day # 5 This plan of care was directed by Dr. Stefanie Libel, NP-C   ATTENDING NOTE: I reviewed above note and agree with the assessment and plan. Pt was seen and examined.   Patient much more awake alert today, eyes open, more interactive than before although still lethargic.  He was laying in bed, watching TV.  On examination, patient orientated to place, but not to age or time.  Severe dysarthria with paucity of speech, only answer questions with simple words.  Able to name 2/3, only able to repeat words but not sentences.  Follows simple commands.  Visual field full, able to track bilaterally without gaze palsy.  Tongue midline, slight right nasolabial fold flattening.  Bilateral upper extremities 3+/5, bilateral lower extremities proximal 3  -/5, distal 3+5.  Sensation subjectively intact, finger-to-nose slow bilaterally but no ataxia.  TEE no endocarditis, bubble study suggesting intrapulmonary shunting but no intracardiac shunting.  Etiology for patient multifocal bilateral infarcts likely due to global hypoxic ischemic changes and cocaine vasculopathy.  Cardioembolic source cannot be completely ruled out.  DVT negative.  Patient passed swallow, on diet.  Still has tube feeding due to inadequate p.o. intake.  Recommend 30-day cardiac event monitoring later as outpatient if able to be arranged.  Recommend aspirin 81 and Plavix 75 DAPT for 3 weeks and then aspirin alone.  Continue statin.  Risk factor modification, cocaine cessation education provided.  Continue follow-up with ID for HIV treatment.  For detailed assessment and plan, please refer to above as I have made changes wherever appropriate.   Neurology will sign off. Please call with questions. Pt will follow up with stroke clinic Dr. Leonie Man at [facility] in about 4 weeks. Thanks for the consult.   Rosalin Hawking, MD PhD Stroke Neurology 11/21/2020 3:52 PM     To contact Stroke Continuity provider, please refer to [email or web address] After hours,  contact General Neurology

## 2020-11-22 DIAGNOSIS — T796XXA Traumatic ischemia of muscle, initial encounter: Secondary | ICD-10-CM

## 2020-11-22 DIAGNOSIS — G9340 Encephalopathy, unspecified: Secondary | ICD-10-CM | POA: Diagnosis not present

## 2020-11-22 DIAGNOSIS — N179 Acute kidney failure, unspecified: Secondary | ICD-10-CM | POA: Diagnosis not present

## 2020-11-22 DIAGNOSIS — I5041 Acute combined systolic (congestive) and diastolic (congestive) heart failure: Secondary | ICD-10-CM | POA: Diagnosis not present

## 2020-11-22 LAB — CBC WITH DIFFERENTIAL/PLATELET
Abs Immature Granulocytes: 0.67 10*3/uL — ABNORMAL HIGH (ref 0.00–0.07)
Basophils Absolute: 0.1 10*3/uL (ref 0.0–0.1)
Basophils Relative: 1 %
Eosinophils Absolute: 0.1 10*3/uL (ref 0.0–0.5)
Eosinophils Relative: 0 %
HCT: 41 % (ref 39.0–52.0)
Hemoglobin: 12.9 g/dL — ABNORMAL LOW (ref 13.0–17.0)
Immature Granulocytes: 6 %
Lymphocytes Relative: 8 %
Lymphs Abs: 0.9 10*3/uL (ref 0.7–4.0)
MCH: 30 pg (ref 26.0–34.0)
MCHC: 31.5 g/dL (ref 30.0–36.0)
MCV: 95.3 fL (ref 80.0–100.0)
Monocytes Absolute: 0.9 10*3/uL (ref 0.1–1.0)
Monocytes Relative: 8 %
Neutro Abs: 8.5 10*3/uL — ABNORMAL HIGH (ref 1.7–7.7)
Neutrophils Relative %: 77 %
Platelets: 314 10*3/uL (ref 150–400)
RBC: 4.3 MIL/uL (ref 4.22–5.81)
RDW: 14.6 % (ref 11.5–15.5)
WBC: 11.2 10*3/uL — ABNORMAL HIGH (ref 4.0–10.5)
nRBC: 0.2 % (ref 0.0–0.2)

## 2020-11-22 LAB — BASIC METABOLIC PANEL
Anion gap: 9 (ref 5–15)
BUN: 17 mg/dL (ref 8–23)
CO2: 22 mmol/L (ref 22–32)
Calcium: 8.7 mg/dL — ABNORMAL LOW (ref 8.9–10.3)
Chloride: 113 mmol/L — ABNORMAL HIGH (ref 98–111)
Creatinine, Ser: 1.02 mg/dL (ref 0.61–1.24)
GFR, Estimated: >60 mL/min (ref >60)
Glucose, Bld: 105 mg/dL — ABNORMAL HIGH (ref 70–99)
Potassium: 3.3 mmol/L — ABNORMAL LOW (ref 3.5–5.1)
Sodium: 144 mmol/L (ref 135–145)

## 2020-11-22 LAB — GLUCOSE, CAPILLARY: Glucose-Capillary: 96 mg/dL (ref 70–99)

## 2020-11-22 LAB — CK: Total CK: 556 U/L — ABNORMAL HIGH (ref 49–397)

## 2020-11-22 MED ORDER — EMTRICITABINE-TENOFOVIR AF 200-25 MG PO TABS
1.0000 | ORAL_TABLET | Freq: Every day | ORAL | Status: DC
  Administered 2020-11-23: 1
  Filled 2020-11-22: qty 1

## 2020-11-22 MED ORDER — DOLUTEGRAVIR SODIUM 50 MG PO TABS
50.0000 mg | ORAL_TABLET | Freq: Every day | ORAL | Status: DC
  Administered 2020-11-23: 50 mg
  Filled 2020-11-22: qty 1

## 2020-11-22 MED ORDER — ENOXAPARIN SODIUM 40 MG/0.4ML ~~LOC~~ SOLN
40.0000 mg | SUBCUTANEOUS | Status: DC
  Administered 2020-11-22 – 2020-11-25 (×4): 40 mg via SUBCUTANEOUS
  Filled 2020-11-22 (×4): qty 0.4

## 2020-11-22 MED ORDER — POTASSIUM CHLORIDE 20 MEQ PO PACK
40.0000 meq | PACK | Freq: Once | ORAL | Status: AC
  Administered 2020-11-22: 40 meq via ORAL
  Filled 2020-11-22: qty 2

## 2020-11-22 NOTE — Progress Notes (Signed)
Subjective:  Spiked a fever overnight, but no other acute overnight events.  On initial exam this morning, he was more nonverbal than usual and did not follow simple commands.  Spoke with RN Juliann Pulse, who states that he has been following her commands and that he had some applesauce and some Ensure this morning.  Addendum: Reexamined patient this afternoon, he is sitting in chair and is more verbal than this morning.  Reports that he has no pain and that he feels about the same as yesterday.  States that he did have "a little" food for lunch.  RN Juliann Pulse states he had a couple bites of eggs and sausage along with a milkshake for lunch.  We will remove cortrak as patient seems to be tolerating p.o. intake.  Objective: Vital signs in last 24 hours: Vitals:   11/22/20 0046 11/22/20 0333  BP:  119/65  Pulse:  84  Resp:  16  Temp: 100 F (37.8 C) 97.9 F (36.6 C)  SpO2:  97%   General: Chronically ill-appearing elderly male sitting in chair, able to track objects, follows simple commands, NAD. Eyes: Symmetric, PERRL, extra ocular muscles intact, tracking objects. Cardiovascular: Regular rate and normal rhythm, no murmurs rubs or gallops. Skin: Tense bullae overlying extensor surfaces of hands wrists lower extremities bilaterally.  Wound dressings over both knees.  No new lesions identified. Neuro: Follows commands, able to squeeze fingers on command with symmetrical strength bilaterally and able to wiggle toes on command.  Mental status improved from this morning and appears about the same as yesterday.   Intake/Output Summary (Last 24 hours) at 11/22/2020 0658 Last data filed at 11/22/2020 0600 Gross per 24 hour  Intake 940.7 ml  Output 1950 ml  Net -1009.3 ml   Filed Weights   11/19/20 0500 11/20/20 0412 11/22/20 0333  Weight: 62.3 kg 63.1 kg 58.8 kg   Labs in last 24 hours: CBC Latest Ref Rng & Units 11/22/2020 11/21/2020 11/20/2020  WBC 4.0 - 10.5 K/uL 11.2(H) 13.3(H) 10.6(H)   Hemoglobin 13.0 - 17.0 g/dL 12.9(L) 13.1 13.9  Hematocrit 39.0 - 52.0 % 41.0 41.6 44.3  Platelets 150 - 400 K/uL 314 250 234    CMP Latest Ref Rng & Units 11/22/2020 11/21/2020 11/20/2020  Glucose 70 - 99 mg/dL 105(H) 103(H) 88  BUN 8 - 23 mg/dL 17 19 18   Creatinine 0.61 - 1.24 mg/dL 1.02 1.01 0.98  Sodium 135 - 145 mmol/L 144 146(H) 146(H)  Potassium 3.5 - 5.1 mmol/L 3.3(L) 3.6 3.3(L)  Chloride 98 - 111 mmol/L 113(H) 115(H) 115(H)  CO2 22 - 32 mmol/L 22 21(L) 22  Calcium 8.9 - 10.3 mg/dL 8.7(L) 8.3(L) 8.2(L)  Total Protein 6.5 - 8.1 g/dL - - -  Total Bilirubin 0.3 - 1.2 mg/dL - - -  Alkaline Phos 38 - 126 U/L - - -  AST 15 - 41 U/L - - -  ALT 0 - 44 U/L - - -    Assessment/Plan:  Active Problems:   Acute encephalopathy   Pressure injury of skin   Acute renal failure (HCC)   Traumatic rhabdomyolysis (HCC)   Bullous skin disease   Multiple cerebral infarctions (HCC)   Rectal cancer (HCC)   Acute combined systolic and diastolic heart failure (HCC)   Hyperkalemia   Tongue lesion  [name] is a 65 year old man with a history of AIDS and substance use admitted with acute encephalopathy found to have numerous new acute/subacute ischemic infarcts on MRI Brain and severe rhabdomyolysis  with multiple metabolic abnormalities.  #Acute encephalopathy #Multiple acute/subacute ischemic infarcts #Hypoxic ischemic injury Patient's encephalopathy appears similar to yesterday on exam this afternoon.  He still does not appear to be close to his baseline mental status although this may be his new baseline given his significant anoxic brain injury.  He appears to be tolerating his dysphagia 2 diet and thus we will remove cortrak at this time.  He did spike a fever overnight although this resolved by this morning.  If he spikes another fever, will need to consider repeat work-up for infection. - Continue DAPT x3 weeks and then aspirin alone - Continue statin - Follow-up in stroke clinic with  Dr. Leonie Man at [facility] in about 4 weeks (may consider 30-day cardiac event monitoring as outpatient if able to be arranged) - ID following, appreciate recommendations - Cardiology following, appreciate recommendations - Pending PT/OT evaluation  #Bullous Lesions Lesion seen on exam today.  Lesions are still localized to dorsal aspects of hands, extensor surfaces of legs and perianal region.  ID following, appreciate recommendations.  Differentials for patient's lesions include bullous pemphigoid, levamisole-induced vasculitis, drug reaction, or porphyria cutanea tarda.  Patient would benefit from punch biopsy for further evaluation however attempts to contact family for consent have been unsuccessful.  We will hold off on treatment until family can be contacted for consent. 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  Continue carvedilol 3.125 mg twice daily and losartan 25 mg daily (titrate up as needed). Cardiology managing, appreciate assistance.  #Rhabdomyolysis - resolving Patient's CK continues to improve with IVF, now 556.  Creatinine down to 1.02. -Stop checking CK   #Hypernatremia, resolved Patient's sodium of 141 on admission, trended up to 150, now at 144.  Continue to trend daily BMP.  #AIDS -ID managing -Continue Biktarvy   LOS: 6 days   [name], MD 11/22/2020, 6:58 AM

## 2020-11-22 NOTE — Progress Notes (Signed)
Ok to change Biktarvy to dolutegravir + Descovy due to crushing issue with Biktarvy per Dr. [name].  [name], PharmD, BCIDP, AAHIVP, CPP Infectious Disease Pharmacist 11/22/2020 12:34 PM

## 2020-11-22 NOTE — Evaluation (Signed)
Physical Therapy Evaluation Patient Details Name: [name] MRN: [id number] DOB: 05-18-1956 Today's Date: 11/22/2020   History of Present Illness   [name] is a 65 y.o. 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 Pt with very slow processing, initiation and sequencing and only able to follow simple commands repeated frequently. Pt is currently totaAx2 for bed mobility, and transfer to drop arm recliner. PT currently recommending CIR level care as H&P indicates that he was trimming his neighbors bushes the day prior to be founding down. PT will continue to follow acutely.     Follow Up Recommendations CIR    Equipment Recommendations   (TBD)    Recommendations for Other Services Rehab consult     Precautions / Restrictions Precautions Precautions: Fall Precaution Comments: found down, NG-tube Restrictions Weight Bearing Restrictions: No      Mobility  Bed Mobility Overal bed mobility: Needs Assistance Bed Mobility: Supine to Sit     Supine to sit: Total assist;+2 for physical assistance     General bed mobility comments: pt with limited ability to reach with L UE to bed rail and for movement of LE across bed, pt with very slow processing, and initiation, with increased time for all movement.    Transfers Overall transfer level: Needs assistance Equipment used: 2 person hand held assist Transfers: Squat Pivot Transfers     Squat pivot transfers: Total assist;+2 physical assistance;From elevated surface     General transfer comment: total Ax2 due to decreased strength and very slowed movement  Ambulation/Gait             General Gait Details: unable       Balance Overall balance assessment:  Needs assistance Sitting-balance support: Feet supported;Bilateral upper extremity supported Sitting balance-Leahy Scale: Zero Sitting balance - Comments: maxAx2-min guard,able to static sit with increased L lateral lean for one bout of approximately 10 sec before requiring outside assist Postural control: Left lateral lean                                   Pertinent Vitals/Pain Pain Assessment: No/denies pain    Home Living Family/patient expects to be discharged to:: Unsure Living Arrangements: Non-relatives/Friends               Additional Comments: unable to report    Prior Function           Comments: pt unable to report     Hand Dominance        Extremity/Trunk Assessment   Upper Extremity Assessment Upper Extremity Assessment: Defer to OT evaluation    Lower Extremity Assessment Lower Extremity Assessment: Difficult to assess due to impaired cognition;RLE deficits/detail;LLE deficits/detail RLE Deficits / Details: lacks full A/PROM, strength grossly assessed at 2+/5 LLE Deficits / Details: lacks full A/PROM, strength grossly assessed at 2+/5    Cervical / Trunk Assessment Cervical / Trunk Assessment: Other exceptions (L lateral lean)  Communication   Communication: Receptive difficulties;Expressive difficulties;Other (comment) (very slow processing, decreased phonation and decrease answers to questions)  Cognition Arousal/Alertness: Awake/alert Behavior During Therapy: Flat affect  General Comments General comments (skin integrity, edema, etc.): VSS on RA        Assessment/Plan    PT Assessment Patient needs continued PT services  PT Problem List Decreased strength;Decreased activity tolerance;Decreased balance;Decreased mobility;Decreased coordination;Decreased cognition;Decreased safety awareness       PT Treatment Interventions DME instruction;Gait training;Stair  training;Functional mobility training;Therapeutic activities;Therapeutic exercise;Balance training;Cognitive remediation;Patient/family education    PT Goals (Current goals can be found in the Care Plan section)  Acute Rehab PT Goals Patient Stated Goal: none stated PT Goal Formulation: Patient unable to participate in goal setting Time For Goal Achievement: 12/06/20 Potential to Achieve Goals: Fair    Frequency Min 3X/week        Co-evaluation  PT/OT/SLP Co-Evaluation/Treatment: Yes Reason for Co-Treatment: Complexity of the patient's impairments (multi-system involvement);For patient/therapist safety;To address functional/ADL transfers PT goals addressed during session: Mobility             AM-PAC PT "6 Clicks" Mobility  Outcome Measure Help needed turning from your back to your side while in a flat bed without using bedrails?: A Lot Help needed moving from lying on your back to sitting on the side of a flat bed without using bedrails?: Total Help needed moving to and from a bed to a chair (including a wheelchair)?: Total Help needed standing up from a chair using your arms (e.g., wheelchair or bedside chair)?: Total Help needed to walk in hospital room?: Total Help needed climbing 3-5 steps with a railing? : Total 6 Click Score: 7    End of Session Equipment Utilized During Treatment: Gait belt Activity Tolerance: Patient tolerated treatment well Patient left: in bed;with call bell/phone within reach;with bed alarm set;Other (comment) (MD in room) Nurse Communication: Mobility status;Other (comment) (assit for eating breakfast) PT Visit Diagnosis: Unsteadiness on feet (R26.81);Other abnormalities of gait and mobility (R26.89);Repeated falls (R29.6);Muscle weakness (generalized) (M62.81);History of falling (Z91.81)    Time: 0233-4356 PT Time Calculation (min) (ACUTE ONLY): 27 min   Charges:   PT Evaluation $PT Eval Moderate Complexity: 1 Mod          Jessieca Rhem  B. Migdalia Dk PT, DPT Acute Rehabilitation Services Pager [phone] Office [phone]   [place] 11/22/2020, 1:31 PM

## 2020-11-22 NOTE — Progress Notes (Signed)
Subjective:  [name] did not vocalize any complaints the 2 times I saw him this morning  Antibiotics:  Anti-infectives (From admission, onward)   Start     Dose/Rate Route Frequency Ordered Stop   11/23/20 1000  dolutegravir (TIVICAY) tablet 50 mg        50 mg Per Tube Daily 11/22/20 1233     11/23/20 1000  emtricitabine-tenofovir AF (DESCOVY) 200-25 MG per tablet 1 tablet        1 tablet Per Tube Daily 11/22/20 1233     11/19/20 1800  acyclovir (ZOVIRAX) 600 mg in dextrose 5 % 250 mL IVPB  Status:  Discontinued        600 mg 262 mL/hr over 60 Minutes Intravenous Every 8 hours 11/19/20 0945 11/19/20 1357   11/18/20 2200  acyclovir (ZOVIRAX) 600 mg in dextrose 5 % 250 mL IVPB  Status:  Discontinued        600 mg 262 mL/hr over 60 Minutes Intravenous Every 12 hours 11/18/20 1139 11/19/20 0945   11/18/20 1630  bictegravir-emtricitabine-tenofovir AF (BIKTARVY) 50-200-25 MG per tablet 1 tablet  Status:  Discontinued        1 tablet Oral Daily 11/18/20 1539 11/22/20 1233   11/18/20 1000  cefTRIAXone (ROCEPHIN) 2 g in sodium chloride 0.9 % 100 mL IVPB  Status:  Discontinued        2 g 200 mL/hr over 30 Minutes Intravenous Every 12 hours 11/18/20 0651 11/18/20 0846   11/18/20 0928  acyclovir (ZOVIRAX) 600 mg in dextrose 5 % 250 mL IVPB  Status:  Discontinued        600 mg 262 mL/hr over 60 Minutes Intravenous Every 24 hours 11/17/20 1448 11/18/20 1139   11/17/20 1530  cefTRIAXone (ROCEPHIN) 2 g in sodium chloride 0.9 % 100 mL IVPB  Status:  Discontinued        2 g 200 mL/hr over 30 Minutes Intravenous Every 12 hours 11/17/20 1432 11/18/20 0651   11/17/20 1530  ampicillin (OMNIPEN) 2 g in sodium chloride 0.9 % 100 mL IVPB  Status:  Discontinued        2 g 300 mL/hr over 20 Minutes Intravenous Every 8 hours 11/17/20 1432 11/18/20 0651   11/17/20 1330  ceFEPIme (MAXIPIME) 2 g in sodium chloride 0.9 % 100 mL IVPB  Status:  Discontinued        2 g 200 mL/hr over 30 Minutes Intravenous  Every 24 hours 11/16/20 1342 11/17/20 0647   11/17/20 1000  vancomycin (VANCOREADY) IVPB 750 mg/150 mL  Status:  Discontinued        750 mg 150 mL/hr over 60 Minutes Intravenous Every 24 hours 11/17/20 0654 11/17/20 0712   11/17/20 0800  ceFEPIme (MAXIPIME) 2 g in sodium chloride 0.9 % 100 mL IVPB  Status:  Discontinued        2 g 200 mL/hr over 30 Minutes Intravenous Every 24 hours 11/17/20 0647 11/17/20 1430   11/17/20 0745  vancomycin (VANCOREADY) IVPB 1000 mg/200 mL  Status:  Discontinued        1,000 mg 200 mL/hr over 60 Minutes Intravenous Every 8 hours 11/17/20 0647 11/17/20 0650   11/17/20 0745  vancomycin (VANCOREADY) IVPB 1250 mg/250 mL  Status:  Discontinued        1,250 mg 166.7 mL/hr over 90 Minutes Intravenous  Once 11/17/20 0654 11/17/20 0656   11/17/20 0730  acyclovir (ZOVIRAX) 600 mg in dextrose 5 % 250 mL IVPB  Status:  Discontinued        600 mg 262 mL/hr over 60 Minutes Intravenous Every 12 hours 11/17/20 0659 11/17/20 1448   11/17/20 0700  ampicillin (OMNIPEN) 2 g in sodium chloride 0.9 % 100 mL IVPB  Status:  Discontinued        2 g 300 mL/hr over 20 Minutes Intravenous Every 8 hours 11/17/20 0647 11/17/20 1430   11/17/20 0700  acyclovir (ZOVIRAX) 600 mg in dextrose 5 % 250 mL IVPB  Status:  Discontinued        600 mg 262 mL/hr over 60 Minutes Intravenous Every 24 hours 11/17/20 0647 11/17/20 0659   11/16/20 1345  vancomycin (VANCOREADY) IVPB 1250 mg/250 mL        1,250 mg 166.7 mL/hr over 90 Minutes Intravenous  Once 11/16/20 1331 11/16/20 1730   11/16/20 1335  vancomycin variable dose per unstable renal function (pharmacist dosing)  Status:  Discontinued         Does not apply See admin instructions 11/16/20 1335 11/16/20 2014   11/16/20 1330  ceFEPIme (MAXIPIME) 2 g in sodium chloride 0.9 % 100 mL IVPB        2 g 200 mL/hr over 30 Minutes Intravenous  Once 11/16/20 1322 11/16/20 1505   11/16/20 1330  metroNIDAZOLE (FLAGYL) IVPB 500 mg        500 mg 100 mL/hr  over 60 Minutes Intravenous  Once 11/16/20 1322 11/16/20 1505   11/16/20 1330  vancomycin (VANCOREADY) IVPB 1000 mg/200 mL  Status:  Discontinued        1,000 mg 200 mL/hr over 60 Minutes Intravenous  Once 11/16/20 1322 11/16/20 1331      Medications: Scheduled Meds: . aspirin  81 mg Per Tube Daily  . atorvastatin  40 mg Oral Daily  . carvedilol  3.125 mg Per Tube BID WC  . Chlorhexidine Gluconate Cloth  6 each Topical Daily  . clopidogrel  75 mg Oral Daily  . [START ON 11/23/2020] dolutegravir  50 mg Per Tube Daily  . [START ON 11/23/2020] emtricitabine-tenofovir AF  1 tablet Per Tube Daily  . feeding supplement  237 mL Oral TID BM  . losartan  25 mg Per Tube Daily  . multivitamin with minerals  1 tablet Per Tube Daily  . sodium chloride flush  3 mL Intravenous Q12H   Continuous Infusions: . feeding supplement (OSMOLITE 1.2 CAL)     PRN Meds:.acetaminophen **OR** acetaminophen    Objective: Weight change:   Intake/Output Summary (Last 24 hours) at 11/22/2020 1237 Last data filed at 11/22/2020 0713 Gross per 24 hour  Intake 700 ml  Output 1400 ml  Net -700 ml   Blood pressure 123/66, pulse 80, temperature 98.6 F (37 C), temperature source Axillary, resp. rate 16, height 5\' 7"  (1.702 m), weight 58.8 kg, SpO2 99 %. Temp:  [97.9 F (36.6 C)-101.3 F (38.5 C)] 98.6 F (37 C) (04/18 1219) Pulse Rate:  [74-92] 80 (04/18 1219) Resp:  [14-18] 16 (04/18 1219) BP: (119-140)/(56-73) 123/66 (04/18 1219) SpO2:  [97 %-99 %] 99 % (04/18 1219) Weight:  [58.8 kg] 58.8 kg (04/18 0333)  Physical Exam: Physical Exam Constitutional:      Appearance: He is cachectic.  Eyes:     General:        Right eye: No discharge.        Left eye: No discharge.     Extraocular Movements: Extraocular movements intact.     Conjunctiva/sclera: Conjunctivae normal.  Cardiovascular:  Rate and Rhythm: Tachycardia present.     Heart sounds: No murmur heard. No friction rub. No gallop.    Pulmonary:     Effort: Pulmonary effort is normal. No respiratory distress.     Breath sounds: No stridor. No wheezing or rhonchi.  Abdominal:     General: Abdomen is flat. There is no distension.  Skin:    General: Skin is warm.     Findings: Rash present.  Neurological:     Mental Status: He is alert.  Psychiatric:        Speech: He is noncommunicative.        Behavior: Behavior is cooperative.        Cognition and Memory: Cognition is impaired. Memory is impaired.        CBC:    BMET Recent Labs    11/21/20 0332 11/22/20 0126  NA 146* 144  K 3.6 3.3*  CL 115* 113*  CO2 21* 22  GLUCOSE 103* 105*  BUN 19 17  CREATININE 1.01 1.02  CALCIUM 8.3* 8.7*     Liver Panel  No results for input(s): PROT, ALBUMIN, AST, ALT, ALKPHOS, BILITOT, BILIDIR, IBILI in the last 72 hours.   Oral lesion 11/22/2020:      OP lesion 11/19/2020:      Bullous lesions now with some more vasculitis changes    Perianal bullous changes      Sedimentation Rate No results for input(s): ESRSEDRATE in the last 72 hours. C-Reactive Protein No results for input(s): CRP in the last 72 hours.  Micro Results: Recent Results (from the past 720 hour(s))  Resp Panel by RT-PCR (Flu A&B, Covid) Nasopharyngeal Swab     Status: None   Collection Time: 11/16/20 11:28 AM   Specimen: Nasopharyngeal Swab; Nasopharyngeal(NP) swabs in vial transport medium  Result Value Ref Range Status   SARS Coronavirus 2 by RT PCR NEGATIVE NEGATIVE Final    Comment: (NOTE) SARS-CoV-2 target nucleic acids are NOT DETECTED.  The SARS-CoV-2 RNA is generally detectable in upper respiratory specimens during the acute phase of infection. The lowest concentration of SARS-CoV-2 viral copies this assay can detect is 138 copies/mL. A negative result does not preclude SARS-Cov-2 infection and should not be used as the sole basis for treatment or other patient management decisions. A negative result may occur  with  improper specimen collection/handling, submission of specimen other than nasopharyngeal swab, presence of viral mutation(s) within the areas targeted by this assay, and inadequate number of viral copies(<138 copies/mL). A negative result must be combined with clinical observations, patient history, and epidemiological information. The expected result is Negative.  Fact Sheet for Patients:  EntrepreneurPulse.com.au  Fact Sheet for Healthcare Providers:  IncredibleEmployment.be  This test is no t yet approved or cleared by the [place] FDA and  has been authorized for detection and/or diagnosis of SARS-CoV-2 by FDA under an Emergency Use Authorization (EUA). This EUA will remain  in effect (meaning this test can be used) for the duration of the COVID-19 declaration under Section 564(b)(1) of the Act, 21 U.S.C.section 360bbb-3(b)(1), unless the authorization is terminated  or revoked sooner.       Influenza A by PCR NEGATIVE NEGATIVE Final   Influenza B by PCR NEGATIVE NEGATIVE Final    Comment: (NOTE) The Xpert Xpress SARS-CoV-2/FLU/RSV plus assay is intended as an aid in the diagnosis of influenza from Nasopharyngeal swab specimens and should not be used as a sole basis for treatment. Nasal washings and aspirates are unacceptable  for Xpert Xpress SARS-CoV-2/FLU/RSV testing.  Fact Sheet for Patients: EntrepreneurPulse.com.au  Fact Sheet for Healthcare Providers: IncredibleEmployment.be  This test is not yet approved or cleared by the [place] FDA and has been authorized for detection and/or diagnosis of SARS-CoV-2 by FDA under an Emergency Use Authorization (EUA). This EUA will remain in effect (meaning this test can be used) for the duration of the COVID-19 declaration under Section 564(b)(1) of the Act, 21 U.S.C. section 360bbb-3(b)(1), unless the authorization is terminated  or revoked.  Performed at [facility], [place] [address] [place], [place] [address]   Culture, blood (routine x 2)     Status: None   Collection Time: 11/16/20 11:33 AM   Specimen: BLOOD  Result Value Ref Range Status   Specimen Description BLOOD SITE NOT SPECIFIED  Final   Special Requests   Final    BOTTLES DRAWN AEROBIC AND ANAEROBIC Blood Culture results may not be optimal due to an inadequate volume of blood received in culture bottles   Culture   Final    NO GROWTH 5 DAYS Performed at [facility], [place] [address] [place], [place] [address]    Report Status 11/21/2020 FINAL  Final  Culture, blood (routine x 2)     Status: None   Collection Time: 11/16/20 11:33 AM   Specimen: BLOOD  Result Value Ref Range Status   Specimen Description BLOOD SITE NOT SPECIFIED  Final   Special Requests   Final    BOTTLES DRAWN AEROBIC AND ANAEROBIC Blood Culture results may not be optimal due to an inadequate volume of blood received in culture bottles   Culture   Final    NO GROWTH 5 DAYS Performed at [facility], [place] [address] [place], [place] [address]    Report Status 11/21/2020 FINAL  Final  Fungus Culture With Stain     Status: None (Preliminary result)   Collection Time: 11/17/20  4:31 PM  Result Value Ref Range Status   Fungus Stain Final report  Final    Comment: (NOTE) Performed At: [facility] [place], [place] [id number] Rush Farmer MD HC:[id number]    Fungus (Mycology) Culture PENDING  Incomplete   Fungal Source FLUID  Final    Comment: Performed at [facility], [place] [address] [place], [place] [address]  CSF culture w Stat Gram Stain     Status: None   Collection Time: 11/17/20  4:31 PM   Specimen: CSF; Cerebrospinal Fluid  Result Value Ref Range Status   Specimen Description CSF  Final   Special Requests NONE  Final   Gram Stain NO WBC SEEN NO ORGANISMS SEEN CYTOSPIN SMEAR   Final   Culture   Final    NO  GROWTH 3 DAYS Performed at [facility], [place] [address], [place],  [address]    Report Status 11/21/2020 FINAL  Final  Fungus Culture Result     Status: None   Collection Time: 11/17/20  4:31 PM  Result Value Ref Range Status   Result 1 Comment  Final    Comment: (NOTE) KOH/Calcofluor preparation:  no fungus observed. Performed At: [facility] [place], [place] [id number] Rush Farmer MD OE:[id number]     Studies/Results: DG Swallowing Func-Speech Pathology  Result Date: 11/20/2020 Objective Swallowing Evaluation: Type of Study: MBS-Modified Barium Swallow Study  Patient Details Name: [name] MRN: [id number] Date of Birth: 28-Apr-1956 Today's Date: 11/20/2020 Time: SLP  Start Time (ACUTE ONLY): 1502 -SLP Stop Time (ACUTE ONLY): 1512 SLP Time Calculation (min) (ACUTE ONLY): 10 min Past Medical History: Past Medical History: Diagnosis Date . Acute kidney failure   unspec, secondary to medications . AIDS ([place]) 06/07/2016 . Alcohol abuse 05/03/2016 . anal ca dx'd 02/2011 . Cachexia ([place])  . 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 Drug abuse ([place])  . GERD (gastroesophageal reflux disease) 08/09/2016 . Grieving 11/29/2017 . Hemorrhoid  . Hepatitis C  . History of radiation therapy 01/01/2011 -05/18/11  anal, pelvis, inguinal lymph nodes . HIV (human immunodeficiency virus infection) ([place])  . HIV or AIDS  . Housing problems 10/22/2020 . Hypertension  . Loose stools 06/07/2016 . Mass of anus 01/26/2011  squamous cell cancer . Meningitis  . Mood disorder ([place])  . Pain   right leg . Pulmonary nodule  . Rash and nonspecific skin eruption 03/31/2015 . Rectal bleeding 08/09/2020 . Testicular hypofunction  Past Surgical History: Past Surgical History: Procedure Laterality Date . MASS EXCISION  01/26/2011  squamous cell carcinoma HPI: [name] is a 65 y.o. 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 He was found to be encephalopathic at attended with acute renal failure and rhabdomyolysis.  He also has new bullous skin lesions on hands and legs. Pt was found to have multiple infarcts and anoxic changes on MRI 4/12.  CXR 4/12 with no acute findings.  Subjective: Pt awake/alert, pleasant, participative Assessment / Plan / Recommendation CHL IP CLINICAL IMPRESSIONS 11/20/2020 Clinical Impression Pt presents with mild oropharyngeal dysphagia c/b prolonged mastication, premature spillage, delayed swallow initiation, and reduced base of tongue retraction.  These deficits resulted in trace, transient penetration of thin liquid by straw prior to the swallow.  Cup sips, including serial cup sips, prevented penetration of liquids.  There was no penetration or aspiration of any other consistencies trialed.  There was frequent premature spillage with all consistencies to the level of the pyriform sinuses prior to the swallow with bolus remaining in pharynx for period of time increasing with advancing texture of bolus. There was mild vallecula residue with solids. With solids, frontal munching pattern noted with prolonged oral phase and decreased bolus cohesion. Recommend chopped/ground (dysphagia 2) diet with thin liuqids by cup only. SLP Visit Diagnosis -- Attention and concentration deficit following -- Frontal lobe and executive function deficit following -- Impact on safety and function Mild aspiration risk   CHL IP TREATMENT RECOMMENDATION 11/20/2020 Treatment Recommendations Therapy as outlined in treatment plan below   Prognosis 11/20/2020 Prognosis for Safe Diet Advancement Good Barriers to Reach Goals -- Barriers/Prognosis Comment -- CHL IP DIET RECOMMENDATION 11/20/2020 SLP Diet Recommendations Dysphagia 2 (Fine chop) solids;Thin liquid Liquid Administration via Cup;No straw Medication Administration Crushed with puree Compensations Slow rate;Small sips/bites;Minimize  environmental distractions Postural Changes Seated upright at 90 degrees   CHL IP OTHER RECOMMENDATIONS 11/20/2020 Recommended Consults -- Oral Care Recommendations Oral care BID Other Recommendations --   CHL IP FOLLOW UP RECOMMENDATIONS 11/20/2020 Follow up Recommendations (No Data)   CHL IP FREQUENCY AND DURATION 11/20/2020 Speech Therapy Frequency (ACUTE ONLY) min 2x/week Treatment Duration 2 weeks      CHL IP ORAL PHASE 11/20/2020 Oral Phase Impaired Oral - Pudding Teaspoon -- Oral - Pudding Cup -- Oral - Honey Teaspoon -- Oral - Honey Cup -- Oral - Nectar Teaspoon -- Oral - Nectar Cup Premature spillage Oral -  Nectar Straw Premature spillage Oral - Thin Teaspoon -- Oral - Thin Cup Premature spillage Oral - Thin Straw Premature spillage Oral - Puree Lingual pumping;Premature spillage Oral - Mech Soft -- Oral - Regular Premature spillage;Impaired mastication;Lingual pumping;Reduced posterior propulsion Oral - Multi-Consistency -- Oral - Pill -- Oral Phase - Comment --  CHL IP PHARYNGEAL PHASE 11/20/2020 Pharyngeal Phase Impaired Pharyngeal- Pudding Teaspoon -- Pharyngeal -- Pharyngeal- Pudding Cup -- Pharyngeal -- Pharyngeal- Honey Teaspoon -- Pharyngeal -- Pharyngeal- Honey Cup -- Pharyngeal -- Pharyngeal- Nectar Teaspoon -- Pharyngeal -- Pharyngeal- Nectar Cup Delayed swallow initiation-pyriform sinuses Pharyngeal Material does not enter airway Pharyngeal- Nectar Straw Delayed swallow initiation-pyriform sinuses Pharyngeal Material does not enter airway Pharyngeal- Thin Teaspoon -- Pharyngeal -- Pharyngeal- Thin Cup Delayed swallow initiation-pyriform sinuses Pharyngeal Material does not enter airway Pharyngeal- Thin Straw Delayed swallow initiation-pyriform sinuses;Penetration/Aspiration before swallow Pharyngeal Material enters airway, remains ABOVE vocal cords then ejected out Pharyngeal- Puree Delayed swallow initiation-pyriform sinuses Pharyngeal Material does not enter airway Pharyngeal- Mechanical Soft --  Pharyngeal -- Pharyngeal- Regular Delayed swallow initiation-pyriform sinuses Pharyngeal Material does not enter airway Pharyngeal- Multi-consistency -- Pharyngeal -- Pharyngeal- Pill -- Pharyngeal -- Pharyngeal Comment --  CHL IP CERVICAL ESOPHAGEAL PHASE 11/20/2020 Cervical Esophageal Phase WFL Pudding Teaspoon -- Pudding Cup -- Honey Teaspoon -- Honey Cup -- Nectar Teaspoon -- Nectar Cup -- Nectar Straw -- Thin Teaspoon -- Thin Cup -- Thin Straw -- Puree -- Mechanical Soft -- Regular -- Multi-consistency -- Pill -- Cervical Esophageal Comment -- Jason Savage, [name], [name] Acute Rehabilitation Services Office: [phone] 11/20/2020, 4:23 PM                 Assessment/Plan:  INTERVAL HISTORY   Patient was having loose stools this morning with tube feeds.  He was able to eat some of his breakfast with coaching from nursing staff.   Active Problems:   Acute encephalopathy   Pressure injury of skin   Acute renal failure (HCC)   Traumatic rhabdomyolysis (HCC)   Bullous skin disease   Multiple cerebral infarctions (HCC)   Rectal cancer (HCC)   Acute combined systolic and diastolic heart failure (HCC)   Hyperkalemia   Tongue lesion    [name] is a 65 y.o. male with HIV, hepatitis C coinfection alcohol and cocaine abuse history of rectal cancer who was found down after not having been seen for 24 hours.  He was brought to the emergency department where he was found to be encephalopathic at attended with acute renal failure and rhabdomyolysis.  He also has new bullous skin lesions on hands and legs.  MRI of the brain showed  mild multiple infarcts as well as some anoxic changes.  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  #1  Encephalopathy: Likely multifactorial This does not appear to be of an infectious etiology.     #2 multiple infarcts: Being worked up by neurology. Would repeat MRI with contrast add anything? He had one  without contrast [name] in the context of his renal failure\\  #3  Rhabdomyolysis: Resolving along with his renal failure  #4 HIV disease:  Note if his pills are being crushed he should be switched over to [facility] and DESCOVY rather than crushing BIKTARVY  Will check HIV VL and CD4 again in next few days keeping In mind tensional for lab error with the viral load (have seen more falsely elevated VL in the hospital than for ex in our clinic    #  5 Bullous skin lesions:   I wondered about porphyria cutanea tarda but also wonder but other possibilities such as live him is all induced vasculitis given his cocaine abuse.  Rash does appear to be evolving and seem to be no new ones cumulating  #7 Tongue lesion: Agree that it has an aphthous ulcer type appearance.  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 Would assume in the ensuing 6 years of untreated hepatitis C and continued alcohol abuse that it is likely worse  We have been trying to initiate therapy but he had not been good about coming to his appointments.  At his next outpatient therapy appointment I will initiate hepatitis C treatment and hope that he can complete a 2 to 3 months course  #9Problems with polysubstance abuse and depression:  Needs to reengage with counseling.  #10 Hx of rectal cancer and severe rectal pain with bleeding only on for well over 6 months.  He me that he was seen by [place] surgery that but that they "did nothing" I do not know if he had HRA done> Would make sure we have [facility]CS seem him while he is in the hospital once he hopefully recovers from his encephalopathy  #11 Goals of care: Hopefully with time [name] recovers more cognitive function than what I assume was a hypoxic event.  Currently is an excellent job of being adherent to his antiretrovirals and I am certainly confident we can continue to manage his HIV, cure his  hepatitis C as well.  I do wonder though if he may need placement in a skilled facility if he does not improve sufficiently   I spent greater than 35 minutes with the patient including greater than 50% of time in face to face counsel of the patient in reviewing his records and in coordinating his care.    LOS: 6 days   Alcide Evener 11/22/2020, 12:37 PM
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
## 2020-11-22 NOTE — Progress Notes (Signed)
Inpatient Rehab Admissions Coordinator Note:   Per therapy recommendations, pt was screened for CIR candidacy by Shann Medal, PT, DPT.  At this time we are recommending a CIR consult and I will request an order per our protocol.  Please contact me with questions.   Shann Medal, PT, DPT [phone] 11/22/20 2:53 PM
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
## 2020-11-22 NOTE — Evaluation (Signed)
Occupational Therapy Evaluation Patient Details Name: [name] MRN: [id number] DOB: Oct 31, 1955 Today's Date: 11/22/2020    History of Present Illness [name] is a 65 y.o. man with a medical history of AIDS, alcohol abuse, drug abuse, Hepatitis C, squamous cell cancer of the rectum s/p resection and radiation who presents with altered mental status   Clinical Impression   Pt presents with decline in function and safety with ADLs and ADL mobility with impaired strength, balance, endurance and cognition. Pt minimally verbal and pt with very slow processing, and initiation, with increased time for all activity/movement. Pt placing too much food in his mouth before swallowing and and chewing for extensive period of time. PTA, per chart pt living with friends and was Ind with ADLs and ADL mobility although pt unable to provide POLF info at this time. Pt currently requires total A +2 for bed mobility to sit EOB and for squat pivot transfer to recliner, max - total A with ADLs/selfcare. Pt would benefit from acute OT services to address impairments to maximize level of function and safety    Follow Up Recommendations  CIR    Equipment Recommendations  Other (comment) (TBD at next venue of care)    Recommendations for Other Services Rehab consult     Precautions / Restrictions Precautions Precautions: Fall Precaution Comments: found down, NG-tube Restrictions Weight Bearing Restrictions: No      Mobility Bed Mobility Overal bed mobility: Needs Assistance Bed Mobility: Supine to Sit     Supine to sit: Total assist;+2 for physical assistance     General bed mobility comments: pt with limited ability to reach with L UE to bed rail and for movement of LE across bed, pt with very slow processing, and initiation, with increased time for all movement.    Transfers Overall transfer level: Needs assistance Equipment used: 2 person hand held assist Transfers: Squat Pivot  Transfers     Squat pivot transfers: Total assist;+2 physical assistance;From elevated surface     General transfer comment: total Ax2 due to decreased strength and very slowed movement    Balance Overall balance assessment: Needs assistance Sitting-balance support: Feet supported;Bilateral upper extremity supported Sitting balance-Leahy Scale: Zero Sitting balance - Comments: maxAx2-min guard,able to static sit with increased L lateral lean for one bout of approximately 10 sec before requiring outside assist Postural control: Posterior lean;Left lateral lean                                 ADL either performed or assessed with clinical judgement   ADL Overall ADL's : Needs assistance/impaired Eating/Feeding: Set up;Supervision/ safety;Sitting Eating/Feeding Details (indicate cue type and reason): pt required min verbal and physical cues to prevent spilling food and drink Grooming: Wash/dry hands;Wash/dry face;Sitting;Min guard   Upper Body Bathing: Maximal assistance   Lower Body Bathing: Maximal assistance   Upper Body Dressing : Maximal assistance   Lower Body Dressing: Total assistance   Toilet Transfer: Total assistance;+2 for physical assistance;Squat-pivot Toilet Transfer Details (indicate cue type and reason): simulated to drop arm recliner, 2 person HHA squat pivot Toileting- Clothing Manipulation and Hygiene: Total assistance;Bed level       Functional mobility during ADLs: Total assistance;+2 for physical assistance       Vision   Additional Comments: unable to properly assess at this time due to cognition     Perception     Praxis  Pertinent Vitals/Pain Pain Assessment: Faces Faces Pain Scale: No hurt Pain Intervention(s): Monitored during session;Repositioned     Hand Dominance Right   Extremity/Trunk Assessment Upper Extremity Assessment Upper Extremity Assessment: Defer to OT evaluation   Lower Extremity Assessment Lower  Extremity Assessment: Difficult to assess due to impaired cognition;RLE deficits/detail;LLE deficits/detail RLE Deficits / Details: lacks full A/PROM, strength grossly assessed at 2+/5 LLE Deficits / Details: lacks full A/PROM, strength grossly assessed at 2+/5   Cervical / Trunk Assessment Cervical / Trunk Assessment: Other exceptions (L lateral lean)   Communication Communication Communication: Receptive difficulties;Expressive difficulties;Other (comment)   Cognition Arousal/Alertness: Awake/alert Behavior During Therapy: Flat affect Overall Cognitive Status: No family/caregiver present to determine baseline cognitive functioning Area of Impairment: Following commands;Awareness;Problem solving                       Following Commands: Follows one step commands inconsistently;Follows one step commands with increased time     Problem Solving: Slow processing;Decreased initiation;Requires verbal cues;Requires tactile cues General Comments: pt minimally verbal, soft spoken when he does answer questions   General Comments  VSS on RA    Exercises     Shoulder Instructions      Home Living Family/patient expects to be discharged to:: Unsure Living Arrangements: Non-relatives/Friends                               Additional Comments: unable to report      Prior Functioning/Environment          Comments: pt unable to report        OT Problem List: Decreased strength;Impaired balance (sitting and/or standing);Decreased cognition;Decreased safety awareness;Decreased range of motion;Decreased activity tolerance;Decreased coordination;Decreased knowledge of use of DME or AE      OT Treatment/Interventions: Self-care/ADL training;Therapeutic exercise;Patient/family education;Neuromuscular education;Balance training;Therapeutic activities;DME and/or AE instruction    OT Goals(Current goals can be found in the care plan section) Acute Rehab OT  Goals Patient Stated Goal: none stated OT Goal Formulation: Patient unable to participate in goal setting Time For Goal Achievement: 12/06/20 Potential to Achieve Goals: Good ADL Goals Pt Will Perform Grooming: with supervision;with set-up;sitting Pt Will Perform Upper Body Bathing: with mod assist;with min assist;sitting Pt Will Perform Upper Body Dressing: with mod assist;with min assist;sitting Pt Will Transfer to Toilet: with max assist;stand pivot transfer;bedside commode Additional ADL Goal #1: Pt will feed self with 1-2 verbal cues for safety to swallow before next bite and to not spill drinks Additional ADL Goal #2: Pt will follow 1-2 commnads with min verbal and phsyical cues  OT Frequency: Min 2X/week   Barriers to D/C:            Co-evaluation PT/OT/SLP Co-Evaluation/Treatment: Yes Reason for Co-Treatment: Complexity of the patient's impairments (multi-system involvement);For patient/therapist safety;To address functional/ADL transfers   OT goals addressed during session: ADL's and self-care;Proper use of Adaptive equipment and DME      AM-PAC OT "6 Clicks" Daily Activity     Outcome Measure Help from another person eating meals?: A Little Help from another person taking care of personal grooming?: A Little Help from another person toileting, which includes using toliet, bedpan, or urinal?: Total Help from another person bathing (including washing, rinsing, drying)?: A Lot Help from another person to put on and taking off regular upper body clothing?: A Lot Help from another person to put on and taking off regular lower body clothing?:  Total 6 Click Score: 12   End of Session Equipment Utilized During Treatment: Gait belt Nurse Communication: Mobility status  Activity Tolerance: Patient limited by fatigue Patient left: in chair;with call bell/phone within reach;with chair alarm set;Other (comment) (MD present)  OT Visit Diagnosis: Unsteadiness on feet  (R26.81);Other abnormalities of gait and mobility (R26.89);Muscle weakness (generalized) (M62.81);History of falling (Z91.81);Other symptoms and signs involving cognitive function;Cognitive communication deficit (R41.841)                Time: 3958-4417 OT Time Calculation (min): 28 min Charges:  OT General Charges $OT Visit: 1 Visit OT Evaluation $OT Eval Moderate Complexity: 1 Mod    Britt Bottom 11/22/2020, 2:23 PM

## 2020-11-23 DIAGNOSIS — N179 Acute kidney failure, unspecified: Secondary | ICD-10-CM | POA: Diagnosis not present

## 2020-11-23 DIAGNOSIS — G9340 Encephalopathy, unspecified: Secondary | ICD-10-CM | POA: Diagnosis not present

## 2020-11-23 DIAGNOSIS — T796XXA Traumatic ischemia of muscle, initial encounter: Secondary | ICD-10-CM | POA: Diagnosis not present

## 2020-11-23 DIAGNOSIS — I5041 Acute combined systolic (congestive) and diastolic (congestive) heart failure: Secondary | ICD-10-CM | POA: Diagnosis not present

## 2020-11-23 LAB — CBC WITH DIFFERENTIAL/PLATELET
Abs Immature Granulocytes: 0 10*3/uL (ref 0.00–0.07)
Basophils Absolute: 0 10*3/uL (ref 0.0–0.1)
Basophils Relative: 0 %
Eosinophils Absolute: 0.1 10*3/uL (ref 0.0–0.5)
Eosinophils Relative: 1 %
HCT: 40.5 % (ref 39.0–52.0)
Hemoglobin: 12.7 g/dL — ABNORMAL LOW (ref 13.0–17.0)
Lymphocytes Relative: 11 %
Lymphs Abs: 1.3 10*3/uL (ref 0.7–4.0)
MCH: 28.9 pg (ref 26.0–34.0)
MCHC: 31.4 g/dL (ref 30.0–36.0)
MCV: 92.3 fL (ref 80.0–100.0)
Monocytes Absolute: 0.5 10*3/uL (ref 0.1–1.0)
Monocytes Relative: 4 %
Neutro Abs: 9.7 10*3/uL — ABNORMAL HIGH (ref 1.7–7.7)
Neutrophils Relative %: 84 %
Platelets: 376 10*3/uL (ref 150–400)
RBC: 4.39 MIL/uL (ref 4.22–5.81)
RDW: 14.5 % (ref 11.5–15.5)
WBC: 11.6 10*3/uL — ABNORMAL HIGH (ref 4.0–10.5)
nRBC: 0 % (ref 0.0–0.2)
nRBC: 0 /100 WBC

## 2020-11-23 LAB — BASIC METABOLIC PANEL
Anion gap: 5 (ref 5–15)
BUN: 13 mg/dL (ref 8–23)
CO2: 24 mmol/L (ref 22–32)
Calcium: 8.4 mg/dL — ABNORMAL LOW (ref 8.9–10.3)
Chloride: 113 mmol/L — ABNORMAL HIGH (ref 98–111)
Creatinine, Ser: 0.93 mg/dL (ref 0.61–1.24)
GFR, Estimated: >60 mL/min (ref >60)
Glucose, Bld: 91 mg/dL (ref 70–99)
Potassium: 3.6 mmol/L (ref 3.5–5.1)
Sodium: 142 mmol/L (ref 135–145)

## 2020-11-23 LAB — GLUCOSE, CAPILLARY
Glucose-Capillary: 65 mg/dL — ABNORMAL LOW (ref 70–99)
Glucose-Capillary: 205 mg/dL — ABNORMAL HIGH (ref 70–99)

## 2020-11-23 MED ORDER — ADULT MULTIVITAMIN W/MINERALS CH
1.0000 | ORAL_TABLET | Freq: Every day | ORAL | Status: DC
  Administered 2020-11-24 – 2020-12-03 (×9): 1 via ORAL
  Filled 2020-11-23 (×9): qty 1

## 2020-11-23 MED ORDER — ACETAMINOPHEN 650 MG RE SUPP
650.0000 mg | Freq: Four times a day (QID) | RECTAL | Status: DC | PRN
  Administered 2020-12-06: 650 mg via RECTAL
  Filled 2020-11-23: qty 1

## 2020-11-23 MED ORDER — LOSARTAN POTASSIUM 25 MG PO TABS
25.0000 mg | ORAL_TABLET | Freq: Every day | ORAL | Status: DC
  Administered 2020-11-24 – 2020-11-26 (×3): 25 mg via ORAL
  Filled 2020-11-23 (×3): qty 1

## 2020-11-23 MED ORDER — ACETAMINOPHEN 325 MG PO TABS
650.0000 mg | ORAL_TABLET | Freq: Four times a day (QID) | ORAL | Status: DC | PRN
  Administered 2020-11-30 – 2020-12-08 (×5): 650 mg via ORAL
  Filled 2020-11-23 (×5): qty 2

## 2020-11-23 MED ORDER — BICTEGRAVIR-EMTRICITAB-TENOFOV 50-200-25 MG PO TABS
1.0000 | ORAL_TABLET | Freq: Every day | ORAL | Status: DC
  Administered 2020-11-24 – 2020-12-09 (×15): 1 via ORAL
  Filled 2020-11-23 (×18): qty 1

## 2020-11-23 MED ORDER — EMTRICITABINE-TENOFOVIR AF 200-25 MG PO TABS: 1.0000 | ORAL_TABLET | Freq: Every day | ORAL | Status: DC

## 2020-11-23 MED ORDER — DOLUTEGRAVIR SODIUM 50 MG PO TABS: 50.0000 mg | ORAL_TABLET | Freq: Every day | ORAL | Status: DC

## 2020-11-23 MED ORDER — ASPIRIN 81 MG PO CHEW
81.0000 mg | CHEWABLE_TABLET | Freq: Every day | ORAL | Status: DC
  Administered 2020-11-24 – 2020-11-26 (×3): 81 mg via ORAL
  Filled 2020-11-23 (×3): qty 1

## 2020-11-23 NOTE — Progress Notes (Addendum)
Subjective:  No acute overnight events.  His mental status seems to have slightly improved since yesterday.  He is more alert and tracking with his eyes upon entry into the room.  Denies any pain at this time.  Does report that he was able to eat eggs, sausage, and grits earlier today.  States that he has not had any family come by to see him.  Reports that he lives alone.  Discussed his current management and plan.  He nodded his head but unsure if he fully comprehended it.  Objective: Vital signs in last 24 hours: Vitals:   11/23/20 0833 11/23/20 1252  BP: (!) 147/89 137/67  Pulse: 73 71  Resp: 17 18  Temp: (!) 97.5 F (36.4 C) 99.2 F (37.3 C)  SpO2: 96% 98%   General: Chronically ill-appearing elderly male sitting in bed, able to track objects, follows simple commands, NAD. Cardiovascular: Regular rate and normal rhythm, no murmurs rubs or gallops. Skin: Tense bullae overlying extensor surfaces of hands wrists lower extremities bilaterally.  Wound dressings over both knees.  No new lesions identified today. Neuro: Follows simple commands, able to squeeze fingers on command with symmetrical strength bilaterally.  Mental status improved compared to yesterday.   Intake/Output Summary (Last 24 hours) at 11/23/2020 1321 Last data filed at 11/23/2020 1258 Gross per 24 hour  Intake --  Output 1150 ml  Net -1150 ml   Filed Weights   11/19/20 0500 11/20/20 0412 11/22/20 0333  Weight: 62.3 kg 63.1 kg 58.8 kg   Labs in last 24 hours: CBC Latest Ref Rng & Units 11/23/2020 11/22/2020 11/21/2020  WBC 4.0 - 10.5 K/uL 11.6(H) 11.2(H) 13.3(H)  Hemoglobin 13.0 - 17.0 g/dL 12.7(L) 12.9(L) 13.1  Hematocrit 39.0 - 52.0 % 40.5 41.0 41.6  Platelets 150 - 400 K/uL 376 314 250    CMP Latest Ref Rng & Units 11/23/2020 11/22/2020 11/21/2020  Glucose 70 - 99 mg/dL 91 105(H) 103(H)  BUN 8 - 23 mg/dL 13 17 19   Creatinine 0.61 - 1.24 mg/dL 0.93 1.02 1.01  Sodium 135 - 145 mmol/L 142 144 146(H)   Potassium 3.5 - 5.1 mmol/L 3.6 3.3(L) 3.6  Chloride 98 - 111 mmol/L 113(H) 113(H) 115(H)  CO2 22 - 32 mmol/L 24 22 21(L)  Calcium 8.9 - 10.3 mg/dL 8.4(L) 8.7(L) 8.3(L)  Total Protein 6.5 - 8.1 g/dL - - -  Total Bilirubin 0.3 - 1.2 mg/dL - - -  Alkaline Phos 38 - 126 U/L - - -  AST 15 - 41 U/L - - -  ALT 0 - 44 U/L - - -    Assessment/Plan:  Active Problems:   Acute encephalopathy   Pressure injury of skin   Acute renal failure (HCC)   Traumatic rhabdomyolysis (HCC)   Bullous skin disease   Multiple cerebral infarctions (HCC)   Rectal cancer (HCC)   Acute combined systolic and diastolic heart failure (HCC)   Hyperkalemia   Tongue lesion  [name] is a 65 year old man with a history of AIDS and substance use admitted with acute encephalopathy found to have numerous new acute/subacute ischemic infarcts on MRI Brain and severe rhabdomyolysis with multiple metabolic abnormalities.  #Acute encephalopathy #Multiple acute/subacute ischemic infarcts #Hypoxic ischemic injury Patient's encephalopathy appears to be improving gradually.  Unsure what his new baseline will be given significant anoxic brain injury but he does seem to be much improved since admission.  His cortrak was removed and he seems to be tolerating his dysphagia 2 diet  well.  He did not spike a fever overnight so we will hold off on repeat work-up for infection.  Still have been unable to contact any family in order to obtain consent for disposition planning and for punch biopsy (see below), but hopeful that patient will gradually become alert and oriented enough to have the capacity to make his own decisions. - Continue DAPT x3 weeks and then aspirin alone - Continue statin - Follow-up in stroke clinic with Dr. Leonie Man at [facility] in about 4 weeks (may consider 30-day cardiac event monitoring as outpatient if able to be arranged) - ID following, appreciate recommendations - Cardiology following,  appreciate recommendations - PT/OT recommending SNF placement  #Bullous Lesions No change in bullous lesions and no new lesions noted on exam.  ID following, appreciate recommendations.  Differentials include bullous pemphigoid, levamisole induced vasculitis, drug reaction, or porphyria cutanea tarda.  However punch biopsy would be needed for further evaluation.  Unfortunately attempts to contact family for consent have been unsuccessful.  We will hold off on treatment until family can be contacted or patient develops capacity to make his own decisions. - Punch biopsy once consent can be obtained - Follow-up GC testing - Monitor for new lesions  #History of rectal cancer Patient with history of rectal cancer.  He was having severe rectal pain with bleeding for about 6 months.  Patient spoke to Dr. [name] about this during their encounter prior to admission.  Will obtain medical records from [facility] surgery.   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  Continue carvedilol 3.125 mg twice daily and losartan 25 mg daily (titrate up as needed). Cardiology managing, appreciate assistance.  #Rhabdomyolysis - resolved Creatinine back to normal range at 0.93.   #Hypernatremia, resolved Patient's sodium of 141 on admission, trended up to 150.  Sodium now within normal range over the past 2 days, today at 142.  #AIDS -ID managing -Continue Biktarvy   LOS: 7 days   [name], MD 11/23/2020, 1:21 PM

## 2020-11-23 NOTE — Consult Note (Signed)
Physical Medicine and Rehabilitation Consult Reason for Consult: Altered mental status with decreased functional mobility Referring Physician: Dr.Guilloud   HPI: [name] is a 65 y.o. right-handed male with history of AIDS followed by Dr. 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  Presented 11/16/2020 after being found unresponsive on his friend's back porch.  CT/MRI showed a 2.6 area of acute to early subacute ischemic infarct involving the right occipital lobe.  Multiple additional scattered acute to early subacute ischemic infarct involving the cortical and subcortical aspect of both cerebral and cerebellar hemispheres.  Additional abnormal symmetric restricted diffusion involving the globus pallidi bilaterally.  Appearance most characteristic of a superimposed/concomitant hypoxic ischemic injury versus toxic metabolic derangement.  MRA head and neck no large vessel occlusion or proximal stenosis.  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  EEG suggestive of general cerebral dysfunction no seizure.  Echocardiogram with ejection fraction of 45 to 50% the left ventricle demonstrating regional wall motion abnormality hypokinesis of the anterior and anterior septal LV wall segments.  Bilateral lower extremity Dopplers negative.  Admission chemistries lactic acid 3.9, ammonia 70, alcohol negative, blood cultures no growth to date, CK 9003, CD4 T cells 197, urine drug screen positive cocaine, troponin 127-149.  TEE completed showing no endocarditis.  Neurology follow-up currently maintained on aspirin 81 mg daily and Plavix 75 mg daily x3 weeks then aspirin alone.  Lovenox added for DVT prophylaxis.  Lumbar puncture showed clear appearance 1 WBC, protein 50 glucose 66 negative Gram stain.  Dysphagia #2 thin  liquid diet and he was receiving tube feeds for nutritional supplement..  Patient with multiple bullous skin lesions with follow-up per infectious disease as well as maintenance of antiretroviral medications.  Therapy evaluations completed due to patient decreased functional mobility recommendations of physical medicine rehab consult. Currently sleepy.    Review of Systems  Constitutional: Positive for malaise/fatigue. Negative for chills and fever.  HENT: Negative for hearing loss.   Eyes: Negative for blurred vision and double vision.  Respiratory: Negative for cough and shortness of breath.   Cardiovascular: Positive for leg swelling. Negative for chest pain and palpitations.  Gastrointestinal: Positive for diarrhea and nausea. Negative for heartburn.       GERD  Genitourinary: Negative for dysuria, flank pain and hematuria.  Musculoskeletal: Positive for joint pain and myalgias.  Skin: Negative for rash.  All other systems reviewed and are negative.  Past Medical History:  Diagnosis Date  . Acute kidney failure    unspec, secondary to medications  . AIDS ([place]) 06/07/2016  . Alcohol abuse 05/03/2016  . anal ca dx'd 02/2011  . Cachexia ([place])   . 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 Drug abuse ([place])   . GERD (gastroesophageal reflux disease) 08/09/2016  . Grieving 11/29/2017  . Hemorrhoid   . Hepatitis C   . History of radiation therapy 01/01/2011 -05/18/11   anal, pelvis, inguinal lymph nodes  . HIV (human immunodeficiency virus infection) ([place])   . HIV or AIDS   . Housing problems 10/22/2020  . Hypertension   . Loose stools 06/07/2016  . Mass of anus 01/26/2011   squamous cell cancer  . Meningitis   . Mood disorder ([place])   . Pain    right leg  . Pulmonary nodule   . Rash and nonspecific skin eruption  03/31/2015  . Rectal bleeding 08/09/2020  . Testicular hypofunction    Past Surgical History:  Procedure Laterality Date  . BUBBLE STUDY  11/19/2020   Procedure: BUBBLE STUDY;  Surgeon:  [name], MD;  Location: [place];  Service: Cardiology;;  . MASS EXCISION  01/26/2011   squamous cell carcinoma  . TEE WITHOUT CARDIOVERSION N/A 11/19/2020   Procedure: TRANSESOPHAGEAL ECHOCARDIOGRAM (TEE);  Surgeon: [name], MD;  Location: [place];  Service: Cardiology;  Laterality: N/A;   Family History  Family history unknown: Yes   Social History:  reports that he has been smoking. He has never used smokeless tobacco. He reports current alcohol use of about 6.0 standard drinks of alcohol per week. He reports previous drug use. Allergies: No Known Allergies Medications Prior to Admission  Medication Sig Dispense Refill  . clotrimazole (LOTRIMIN) 1 % cream Apply to affected area 2 times daily 15 g 0  . Darunavir-Cobicisctat-Emtricitabine-Tenofovir Alafenamide (SYMTUZA) 800-150-200-10 MG TABS Take 1 tablet by mouth daily with breakfast. 30 tablet 3  . omeprazole (PRILOSEC) 40 MG capsule Take 1 capsule (40 mg total) by mouth daily. 30 capsule 3  . oxyCODONE-acetaminophen (PERCOCET/ROXICET) 5-325 MG tablet Take 2 tablets by mouth every 6 (six) hours as needed for severe pain. 12 tablet 0    Home: Home Living Family/patient expects to be discharged to:: Unsure Living Arrangements: Non-relatives/Friends Additional Comments: unable to report  Functional History: Prior Function Comments: pt unable to report Functional Status:  Mobility: Bed Mobility Overal bed mobility: Needs Assistance Bed Mobility: Supine to Sit Supine to sit: Total assist,+2 for physical assistance General bed mobility comments: pt with limited ability to reach with L UE to bed rail and for movement of LE across bed, pt with very slow processing, and initiation, with increased time for all movement. Transfers Overall transfer level: Needs assistance Equipment used: 2 person hand held assist Transfers: Squat Pivot Transfers Squat pivot transfers: Total assist,+2 physical  assistance,From elevated surface General transfer comment: total Ax2 due to decreased strength and very slowed movement Ambulation/Gait General Gait Details: unable    ADL: ADL Overall ADL's : Needs assistance/impaired Eating/Feeding: Set up,Supervision/ safety,Sitting Eating/Feeding Details (indicate cue type and reason): pt required min verbal and physical cues to prevent spilling food and drink Grooming: Wash/dry hands,Wash/dry face,Sitting,Min guard Upper Body Bathing: Maximal assistance Lower Body Bathing: Maximal assistance Upper Body Dressing : Maximal assistance Lower Body Dressing: Total assistance Toilet Transfer: Total assistance,+2 for physical assistance,Squat-pivot Toilet Transfer Details (indicate cue type and reason): simulated to drop arm recliner, 2 person HHA squat pivot Toileting- Clothing Manipulation and Hygiene: Total assistance,Bed level Functional mobility during ADLs: Total assistance,+2 for physical assistance  Cognition: Cognition Overall Cognitive Status: No family/caregiver present to determine baseline cognitive functioning Orientation Level: Oriented to person,Oriented to place,Disoriented to time,Disoriented to situation Cognition Arousal/Alertness: Awake/alert Behavior During Therapy: Flat affect Overall Cognitive Status: No family/caregiver present to determine baseline cognitive functioning Area of Impairment: Following commands,Awareness,Problem solving Following Commands: Follows one step commands inconsistently,Follows one step commands with increased time Problem Solving: Slow processing,Decreased initiation,Requires verbal cues,Requires tactile cues General Comments: pt minimally verbal, soft spoken when he does answer questions  Blood pressure (!) 148/87, pulse 85, temperature 98.9 F (37.2 C), temperature source Axillary, resp. rate 18, height 5\' 7"  (1.702 m), weight 58.8 kg, SpO2 100 %. Physical Exam Gen: no distress, normal  appearing HEENT: oral mucosa pink and moist, NCAT Cardio: Reg rate Chest: normal effort, normal rate of breathing Abd: soft,  non-distended Ext: no edema Psych: pleasant, normal affect Skin:    Comments: Multiple skin lesions.  Neurological:     Comments: Patient is alert sitting up in bed.  Makes eye contact with examiner.  He is soft-spoken answers and some 1 and 2 word sentences.  Limited medical historian. RLE 2/5, LLE 2/5, 4/5 in upper extremities   Results for orders placed or performed during the hospital encounter of 11/16/20 (from the past 24 hour(s))  Glucose, capillary     Status: None   Collection Time: 11/22/20  8:24 AM  Result Value Ref Range   Glucose-Capillary 96 70 - 99 mg/dL  CBC with Differential/Platelet     Status: Abnormal   Collection Time: 11/23/20 12:30 AM  Result Value Ref Range   WBC 11.6 (H) 4.0 - 10.5 K/uL   RBC 4.39 4.22 - 5.81 MIL/uL   Hemoglobin 12.7 (L) 13.0 - 17.0 g/dL   HCT 40.5 39.0 - 52.0 %   MCV 92.3 80.0 - 100.0 fL   MCH 28.9 26.0 - 34.0 pg   MCHC 31.4 30.0 - 36.0 g/dL   RDW 14.5 11.5 - 15.5 %   Platelets 376 150 - 400 K/uL   nRBC 0.0 0.0 - 0.2 %   Neutrophils Relative % 84 %   Neutro Abs 9.7 (H) 1.7 - 7.7 K/uL   Lymphocytes Relative 11 %   Lymphs Abs 1.3 0.7 - 4.0 K/uL   Monocytes Relative 4 %   Monocytes Absolute 0.5 0.1 - 1.0 K/uL   Eosinophils Relative 1 %   Eosinophils Absolute 0.1 0.0 - 0.5 K/uL   Basophils Relative 0 %   Basophils Absolute 0.0 0.0 - 0.1 K/uL   nRBC 0 0 /100 WBC   Abs Immature Granulocytes 0.00 0.00 - 0.07 K/uL  Basic metabolic panel     Status: Abnormal   Collection Time: 11/23/20 12:30 AM  Result Value Ref Range   Sodium 142 135 - 145 mmol/L   Potassium 3.6 3.5 - 5.1 mmol/L   Chloride 113 (H) 98 - 111 mmol/L   CO2 24 22 - 32 mmol/L   Glucose, Bld 91 70 - 99 mg/dL   BUN 13 8 - 23 mg/dL   Creatinine, Ser 0.93 0.61 - 1.24 mg/dL   Calcium 8.4 (L) 8.9 - 10.3 mg/dL   GFR, Estimated >60 >60 mL/min   Anion  gap 5 5 - 15   No results found.   Assessment/Plan: Diagnosis: Acute encephalopathy 1. Does the need for close, 24 hr/day medical supervision in concert with the patient's rehab needs make it unreasonable for this patient to be served in a less intensive setting? Yes 2. Co-Morbidities requiring supervision/potential complications:  1. Medical history of AIDS: provide counseling 2. Alcohol abuse: provide counseling 3. Drug abuse: provide counseling 4. Hepatitis C 5. Squamous cell cancer of the rectum 3. Due to bladder management, bowel management, safety, skin/wound care, disease management, medication administration, pain management and patient education, does the patient require 24 hr/day rehab nursing? Yes 4. Does the patient require coordinated care of a physician, rehab nurse, therapy disciplines of PT, OT, SLP to address physical and functional deficits in the context of the above medical diagnosis(es)? Yes Addressing deficits in the following areas: balance, endurance, locomotion, strength, transferring, bowel/bladder control, bathing, dressing, feeding, grooming, toileting, cognition, speech and psychosocial support 5. Can the patient actively participate in an intensive therapy program of at least 3 hrs of therapy per day at least 5 days per week? Potentially  6. The potential for patient to make measurable gains while on inpatient rehab is fair 7. Anticipated functional outcomes upon discharge from inpatient rehab are mod assist  with PT, mod assist with OT, mod assist with SLP. 8. Estimated rehab length of stay to reach the above functional goals is: 2-3 weeks 9. Anticipated discharge destination: Home 10. Overall Rehab/Functional Prognosis: fair  RECOMMENDATIONS: This patient's condition is appropriate for continued rehabilitative care in the following setting: To be determined based on patient's progress, currently cannot tolerate 3H daily therapy Patient has agreed to  participate in recommended program. N/A Note that insurance prior authorization may be required for reimbursement for recommended care.  Comment: Thank you for this consult. Admission coordinator to follow.   I have personally performed a face to face diagnostic evaluation, including, but not limited to relevant history and physical exam findings, of this patient and developed relevant assessment and plan.  Additionally, I have reviewed and concur with the physician assistant's documentation above.  Leeroy Cha, MD  Lavon Paganini [place], PA-C 11/23/2020

## 2020-11-23 NOTE — Progress Notes (Signed)
Inpatient Rehabilitation Admissions Coordinator  Discussed with SW, Percell Locus. Patient is currently homeless without caregiver supports from family/brother. CIR is not an option without caregiver supports available after a 2-3 week CIR admit. Other rehab venues need to be pursued. Please call me with any questions.  Danne Baxter, RN, MSN Rehab Admissions Coordinator [phone] 11/23/2020 4:04 PM

## 2020-11-23 NOTE — Progress Notes (Signed)
Subjective:  Jaylun asked me for a soda  Antibiotics:  Anti-infectives (From admission, onward)   Start     Dose/Rate Route Frequency Ordered Stop   11/24/20 1000  dolutegravir (TIVICAY) tablet 50 mg  Status:  Discontinued        50 mg Oral Daily 11/23/20 1140 11/23/20 1237   11/24/20 1000  emtricitabine-tenofovir AF (DESCOVY) 200-25 MG per tablet 1 tablet  Status:  Discontinued        1 tablet Oral Daily 11/23/20 1140 11/23/20 1237   11/24/20 0800  bictegravir-emtricitabine-tenofovir AF (BIKTARVY) 50-200-25 MG per tablet 1 tablet        1 tablet Oral Daily 11/23/20 1237     11/23/20 1000  dolutegravir (TIVICAY) tablet 50 mg  Status:  Discontinued        50 mg Per Tube Daily 11/22/20 1233 11/23/20 1140   11/23/20 1000  emtricitabine-tenofovir AF (DESCOVY) 200-25 MG per tablet 1 tablet  Status:  Discontinued        1 tablet Per Tube Daily 11/22/20 1233 11/23/20 1140   11/19/20 1800  acyclovir (ZOVIRAX) 600 mg in dextrose 5 % 250 mL IVPB  Status:  Discontinued        600 mg 262 mL/hr over 60 Minutes Intravenous Every 8 hours 11/19/20 0945 11/19/20 1357   11/18/20 2200  acyclovir (ZOVIRAX) 600 mg in dextrose 5 % 250 mL IVPB  Status:  Discontinued        600 mg 262 mL/hr over 60 Minutes Intravenous Every 12 hours 11/18/20 1139 11/19/20 0945   11/18/20 1630  bictegravir-emtricitabine-tenofovir AF (BIKTARVY) 50-200-25 MG per tablet 1 tablet  Status:  Discontinued        1 tablet Oral Daily 11/18/20 1539 11/22/20 1233   11/18/20 1000  cefTRIAXone (ROCEPHIN) 2 g in sodium chloride 0.9 % 100 mL IVPB  Status:  Discontinued        2 g 200 mL/hr over 30 Minutes Intravenous Every 12 hours 11/18/20 0651 11/18/20 0846   11/18/20 0928  acyclovir (ZOVIRAX) 600 mg in dextrose 5 % 250 mL IVPB  Status:  Discontinued        600 mg 262 mL/hr over 60 Minutes Intravenous Every 24 hours 11/17/20 1448 11/18/20 1139   11/17/20 1530  cefTRIAXone (ROCEPHIN) 2 g in sodium chloride 0.9 % 100 mL IVPB   Status:  Discontinued        2 g 200 mL/hr over 30 Minutes Intravenous Every 12 hours 11/17/20 1432 11/18/20 0651   11/17/20 1530  ampicillin (OMNIPEN) 2 g in sodium chloride 0.9 % 100 mL IVPB  Status:  Discontinued        2 g 300 mL/hr over 20 Minutes Intravenous Every 8 hours 11/17/20 1432 11/18/20 0651   11/17/20 1330  ceFEPIme (MAXIPIME) 2 g in sodium chloride 0.9 % 100 mL IVPB  Status:  Discontinued        2 g 200 mL/hr over 30 Minutes Intravenous Every 24 hours 11/16/20 1342 11/17/20 0647   11/17/20 1000  vancomycin (VANCOREADY) IVPB 750 mg/150 mL  Status:  Discontinued        750 mg 150 mL/hr over 60 Minutes Intravenous Every 24 hours 11/17/20 0654 11/17/20 0712   11/17/20 0800  ceFEPIme (MAXIPIME) 2 g in sodium chloride 0.9 % 100 mL IVPB  Status:  Discontinued        2 g 200 mL/hr over 30 Minutes Intravenous Every 24 hours 11/17/20 0647 11/17/20 1430  11/17/20 0745  vancomycin (VANCOREADY) IVPB 1000 mg/200 mL  Status:  Discontinued        1,000 mg 200 mL/hr over 60 Minutes Intravenous Every 8 hours 11/17/20 0647 11/17/20 0650   11/17/20 0745  vancomycin (VANCOREADY) IVPB 1250 mg/250 mL  Status:  Discontinued        1,250 mg 166.7 mL/hr over 90 Minutes Intravenous  Once 11/17/20 0654 11/17/20 0656   11/17/20 0730  acyclovir (ZOVIRAX) 600 mg in dextrose 5 % 250 mL IVPB  Status:  Discontinued        600 mg 262 mL/hr over 60 Minutes Intravenous Every 12 hours 11/17/20 0659 11/17/20 1448   11/17/20 0700  ampicillin (OMNIPEN) 2 g in sodium chloride 0.9 % 100 mL IVPB  Status:  Discontinued        2 g 300 mL/hr over 20 Minutes Intravenous Every 8 hours 11/17/20 0647 11/17/20 1430   11/17/20 0700  acyclovir (ZOVIRAX) 600 mg in dextrose 5 % 250 mL IVPB  Status:  Discontinued        600 mg 262 mL/hr over 60 Minutes Intravenous Every 24 hours 11/17/20 0647 11/17/20 0659   11/16/20 1345  vancomycin (VANCOREADY) IVPB 1250 mg/250 mL        1,250 mg 166.7 mL/hr over 90 Minutes Intravenous   Once 11/16/20 1331 11/16/20 1730   11/16/20 1335  vancomycin variable dose per unstable renal function (pharmacist dosing)  Status:  Discontinued         Does not apply See admin instructions 11/16/20 1335 11/16/20 2014   11/16/20 1330  ceFEPIme (MAXIPIME) 2 g in sodium chloride 0.9 % 100 mL IVPB        2 g 200 mL/hr over 30 Minutes Intravenous  Once 11/16/20 1322 11/16/20 1505   11/16/20 1330  metroNIDAZOLE (FLAGYL) IVPB 500 mg        500 mg 100 mL/hr over 60 Minutes Intravenous  Once 11/16/20 1322 11/16/20 1505   11/16/20 1330  vancomycin (VANCOREADY) IVPB 1000 mg/200 mL  Status:  Discontinued        1,000 mg 200 mL/hr over 60 Minutes Intravenous  Once 11/16/20 1322 11/16/20 1331      Medications: Scheduled Meds: . [START ON 11/24/2020] aspirin  81 mg Oral Daily  . atorvastatin  40 mg Oral Daily  . [START ON 11/24/2020] bictegravir-emtricitabine-tenofovir AF  1 tablet Oral Daily  . carvedilol  3.125 mg Per Tube BID WC  . Chlorhexidine Gluconate Cloth  6 each Topical Daily  . clopidogrel  75 mg Oral Daily  . enoxaparin (LOVENOX) injection  40 mg Subcutaneous Q24H  . feeding supplement  237 mL Oral TID BM  . [START ON 11/24/2020] losartan  25 mg Oral Daily  . [START ON 11/24/2020] multivitamin with minerals  1 tablet Oral Daily  . sodium chloride flush  3 mL Intravenous Q12H   Continuous Infusions:  PRN Meds:.acetaminophen **OR** acetaminophen    Objective: Weight change:   Intake/Output Summary (Last 24 hours) at 11/23/2020 1358 Last data filed at 11/23/2020 1258 Gross per 24 hour  Intake --  Output 1150 ml  Net -1150 ml   Blood pressure 137/67, pulse 71, temperature 99.2 F (37.3 C), temperature source Oral, resp. rate 18, height 5\' 7"  (1.702 m), weight 58.8 kg, SpO2 98 %. Temp:  [97.5 F (36.4 C)-99.2 F (37.3 C)] 99.2 F (37.3 C) (04/19 1252) Pulse Rate:  [71-85] 71 (04/19 1252) Resp:  [15-18] 18 (04/19 1252) BP: (136-149)/(67-89) 137/67 (04/19 1252)  SpO2:  [96  %-100 %] 98 % (04/19 1252)  Physical Exam: Physical Exam Constitutional:      Appearance: He is cachectic.  Eyes:     General:        Right eye: No discharge.        Left eye: No discharge.     Extraocular Movements: Extraocular movements intact.     Conjunctiva/sclera: Conjunctivae normal.  Cardiovascular:     Rate and Rhythm: Tachycardia present.     Heart sounds: No murmur heard. No friction rub. No gallop.   Pulmonary:     Effort: Pulmonary effort is normal. No respiratory distress.     Breath sounds: No stridor. No wheezing or rhonchi.  Abdominal:     General: Abdomen is flat. There is no distension.  Skin:    General: Skin is warm.     Findings: Rash present.  Neurological:     General: No focal deficit present.     Mental Status: He is alert.     Comments: He was oriented to being in the hospital  He did not recognize me  Psychiatric:        Speech: Speech is delayed.        Behavior: Behavior is cooperative.        Cognition and Memory: Cognition is impaired. Memory is impaired.        CBC:    BMET Recent Labs    11/22/20 0126 11/23/20 0030  NA 144 142  K 3.3* 3.6  CL 113* 113*  CO2 22 24  GLUCOSE 105* 91  BUN 17 13  CREATININE 1.02 0.93  CALCIUM 8.7* 8.4*     Liver Panel  No results for input(s): PROT, ALBUMIN, AST, ALT, ALKPHOS, BILITOT, BILIDIR, IBILI in the last 72 hours.   Oral lesion 11/22/2020:      OP lesion 11/19/2020:      Bullous lesions now with some more vasculitis changes    Perianal bullous changes      Sedimentation Rate No results for input(s): ESRSEDRATE in the last 72 hours. C-Reactive Protein No results for input(s): CRP in the last 72 hours.  Micro Results: Recent Results (from the past 720 hour(s))  Resp Panel by RT-PCR (Flu A&B, Covid) Nasopharyngeal Swab     Status: None   Collection Time: 11/16/20 11:28 AM   Specimen: Nasopharyngeal Swab; Nasopharyngeal(NP) swabs in vial transport medium   Result Value Ref Range Status   SARS Coronavirus 2 by RT PCR NEGATIVE NEGATIVE Final    Comment: (NOTE) SARS-CoV-2 target nucleic acids are NOT DETECTED.  The SARS-CoV-2 RNA is generally detectable in upper respiratory specimens during the acute phase of infection. The lowest concentration of SARS-CoV-2 viral copies this assay can detect is 138 copies/mL. A negative result does not preclude SARS-Cov-2 infection and should not be used as the sole basis for treatment or other patient management decisions. A negative result may occur with  improper specimen collection/handling, submission of specimen other than nasopharyngeal swab, presence of viral mutation(s) within the areas targeted by this assay, and inadequate number of viral copies(<138 copies/mL). A negative result must be combined with clinical observations, patient history, and epidemiological information. The expected result is Negative.  Fact Sheet for Patients:  EntrepreneurPulse.com.au  Fact Sheet for Healthcare Providers:  IncredibleEmployment.be  This test is no t yet approved or cleared by the [place] FDA and  has been authorized for detection and/or diagnosis of SARS-CoV-2 by FDA under an Emergency Use Authorization (  EUA). This EUA will remain  in effect (meaning this test can be used) for the duration of the COVID-19 declaration under Section 564(b)(1) of the Act, 21 U.S.C.section 360bbb-3(b)(1), unless the authorization is terminated  or revoked sooner.       Influenza A by PCR NEGATIVE NEGATIVE Final   Influenza B by PCR NEGATIVE NEGATIVE Final    Comment: (NOTE) The Xpert Xpress SARS-CoV-2/FLU/RSV plus assay is intended as an aid in the diagnosis of influenza from Nasopharyngeal swab specimens and should not be used as a sole basis for treatment. Nasal washings and aspirates are unacceptable for Xpert Xpress SARS-CoV-2/FLU/RSV testing.  Fact Sheet for  Patients: EntrepreneurPulse.com.au  Fact Sheet for Healthcare Providers: IncredibleEmployment.be  This test is not yet approved or cleared by the [place] FDA and has been authorized for detection and/or diagnosis of SARS-CoV-2 by FDA under an Emergency Use Authorization (EUA). This EUA will remain in effect (meaning this test can be used) for the duration of the COVID-19 declaration under Section 564(b)(1) of the Act, 21 U.S.C. section 360bbb-3(b)(1), unless the authorization is terminated or revoked.  Performed at [facility], [place] [address] [place], [place] [address]   Culture, blood (routine x 2)     Status: None   Collection Time: 11/16/20 11:33 AM   Specimen: BLOOD  Result Value Ref Range Status   Specimen Description BLOOD SITE NOT SPECIFIED  Final   Special Requests   Final    BOTTLES DRAWN AEROBIC AND ANAEROBIC Blood Culture results may not be optimal due to an inadequate volume of blood received in culture bottles   Culture   Final    NO GROWTH 5 DAYS Performed at [facility], [place] [address], [place], [place] [address]    Report Status 11/21/2020 FINAL  Final  Culture, blood (routine x 2)     Status: None   Collection Time: 11/16/20 11:33 AM   Specimen: BLOOD  Result Value Ref Range Status   Specimen Description BLOOD SITE NOT SPECIFIED  Final   Special Requests   Final    BOTTLES DRAWN AEROBIC AND ANAEROBIC Blood Culture results may not be optimal due to an inadequate volume of blood received in culture bottles   Culture   Final    NO GROWTH 5 DAYS Performed at [facility], [place] [address] [place], [place] [address]    Report Status 11/21/2020 FINAL  Final  Fungus Culture With Stain     Status: None (Preliminary result)   Collection Time: 11/17/20  4:31 PM  Result Value Ref Range Status   Fungus Stain Final report  Final    Comment: (NOTE) Performed At: [facility] [place], [place] [id number] Rush Farmer MD [id number]    Fungus (Mycology) Culture PENDING  Incomplete   Fungal Source FLUID  Final    Comment: Performed at [facility], [place] [address] [place], [place] [address]  CSF culture w Stat Gram Stain     Status: None   Collection Time: 11/17/20  4:31 PM   Specimen: CSF; Cerebrospinal Fluid  Result Value Ref Range Status   Specimen Description CSF  Final   Special Requests NONE  Final   Gram Stain NO WBC SEEN NO ORGANISMS SEEN CYTOSPIN SMEAR   Final   Culture   Final    NO GROWTH 3 DAYS Performed at [facility], [place] [address], [place], [place] [address]    Report  Status 11/21/2020 FINAL  Final  Fungus Culture Result     Status: None   Collection Time: 11/17/20  4:31 PM  Result Value Ref Range Status   Result 1 Comment  Final    Comment: (NOTE) KOH/Calcofluor preparation:  no fungus observed. Performed At: [facility] [place], [place] [id number] Rush Farmer MD BP:[id number]     Studies/Results: No results found.    Assessment/Plan:  INTERVAL HISTORY   His neurological status has improved dramatically since I saw him yesterday.  He has had some loose bowel movements  Active Problems:   Acute encephalopathy   Pressure injury of skin   Acute renal failure (HCC)   Traumatic rhabdomyolysis (HCC)   Bullous skin disease   Multiple cerebral infarctions (HCC)   Rectal cancer (HCC)   Acute combined systolic and diastolic heart failure (HCC)   Hyperkalemia   Tongue lesion    Brigg Jerid Catherman is a 65 y.o. male with HIV, hepatitis C coinfection alcohol and cocaine abuse history of rectal cancer who was found down after not having been seen for 24 hours.  He was brought to the emergency department where he was found to be encephalopathic at attended with acute renal failure and rhabdomyolysis.  He also has new bullous skin lesions on hands and legs.  MRI of the brain showed  mild  multiple infarcts as well as some anoxic changes.  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  #1  Encephalopathy: Likely multifactorial This does not appear to be of an infectious etiology.  He also seems to be getting some progress   #2 multiple infarcts: Being worked up by neurology. Would repeat MRI with contrast add anything? He had one without contrast Caryl Pina in the context of his renal failure\\  #3  Rhabdomyolysis: Resolving along with his renal failure  #4 HIV disease:  Is now swallowing pills again and we have switching back to  Will check HIV VL and CD4 again    #5 Bullous skin lesions:   I wondered about porphyria cutanea tarda but also wonder but other possibilities such as live him is all induced vasculitis given his cocaine abuse.  Rash does appear to be evolving and seem to be no new ones cumulating  #7 Tongue lesion: Agree that it has an aphthous ulcer type appearance.  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 Would assume in the ensuing 6 years of untreated hepatitis C and continued alcohol abuse that it is likely worse  We have been trying to initiate therapy but he had not been good about coming to his appointments.  At his next outpatient therapy appointment I will initiate hepatitis C treatment and hope that he can complete a 2 to 3 months course  #9Problems with polysubstance abuse and depression:  Needs to reengage with counseling.  #10 Hx of rectal cancer and severe rectal pain with bleeding only on for well over 6 months.  He me that he was seen by [place] [facility] that but that they "did nothing" I do not know if he had HRA done> Would make sure we have CCS seem him while he is in the hospital once he hopefully recovers from his encephalopathy  I do wonder though if he may need placement in a skilled facility if he does not improve  sufficiently  #11 Loose stools: Did not have much  suspicion for a viral gastroenteritis.  He has had tube feeds recently and shortly went for time here without much food.  #12 Homelessness: Lavonne tells me today that he lives wherever he can.     LOS: 7 days   Alcide Evener 11/23/2020, 1:58 PM

## 2020-11-24 DIAGNOSIS — I5041 Acute combined systolic (congestive) and diastolic (congestive) heart failure: Secondary | ICD-10-CM | POA: Diagnosis not present

## 2020-11-24 DIAGNOSIS — E875 Hyperkalemia: Secondary | ICD-10-CM | POA: Diagnosis not present

## 2020-11-24 DIAGNOSIS — L139 Bullous disorder, unspecified: Secondary | ICD-10-CM | POA: Diagnosis not present

## 2020-11-24 DIAGNOSIS — N179 Acute kidney failure, unspecified: Secondary | ICD-10-CM | POA: Diagnosis not present

## 2020-11-24 DIAGNOSIS — Z5900 Homelessness unspecified: Secondary | ICD-10-CM

## 2020-11-24 LAB — BASIC METABOLIC PANEL
Anion gap: 8 (ref 5–15)
BUN: 13 mg/dL (ref 8–23)
CO2: 21 mmol/L — ABNORMAL LOW (ref 22–32)
Calcium: 7.8 mg/dL — ABNORMAL LOW (ref 8.9–10.3)
Chloride: 108 mmol/L (ref 98–111)
Creatinine, Ser: 0.84 mg/dL (ref 0.61–1.24)
GFR, Estimated: >60 mL/min (ref >60)
Glucose, Bld: 78 mg/dL (ref 70–99)
Potassium: 3.6 mmol/L (ref 3.5–5.1)
Sodium: 137 mmol/L (ref 135–145)

## 2020-11-24 LAB — CBC WITH DIFFERENTIAL/PLATELET
Abs Immature Granulocytes: 0.24 10*3/uL — ABNORMAL HIGH (ref 0.00–0.07)
Basophils Absolute: 0.1 10*3/uL (ref 0.0–0.1)
Basophils Relative: 0 %
Eosinophils Absolute: 0.1 10*3/uL (ref 0.0–0.5)
Eosinophils Relative: 1 %
HCT: 35.8 % — ABNORMAL LOW (ref 39.0–52.0)
Hemoglobin: 11.6 g/dL — ABNORMAL LOW (ref 13.0–17.0)
Immature Granulocytes: 2 %
Lymphocytes Relative: 13 %
Lymphs Abs: 1.5 10*3/uL (ref 0.7–4.0)
MCH: 29.6 pg (ref 26.0–34.0)
MCHC: 32.4 g/dL (ref 30.0–36.0)
MCV: 91.3 fL (ref 80.0–100.0)
Monocytes Absolute: 0.8 10*3/uL (ref 0.1–1.0)
Monocytes Relative: 7 %
Neutro Abs: 8.7 10*3/uL — ABNORMAL HIGH (ref 1.7–7.7)
Neutrophils Relative %: 77 %
Platelets: 380 10*3/uL (ref 150–400)
RBC: 3.92 MIL/uL — ABNORMAL LOW (ref 4.22–5.81)
RDW: 13.9 % (ref 11.5–15.5)
WBC: 11.4 10*3/uL — ABNORMAL HIGH (ref 4.0–10.5)
nRBC: 0 % (ref 0.0–0.2)

## 2020-11-24 LAB — GLUCOSE, CAPILLARY: Glucose-Capillary: 94 mg/dL (ref 70–99)

## 2020-11-24 NOTE — Progress Notes (Signed)
Physical Therapy Treatment Patient Details Name: [name] MRN: [id number] DOB: Nov 26, 1955 Today's Date: 11/24/2020    History of Present Illness [name] is a 65 y.o. 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 Pt with improved strength and balance today but continues to be limited by slowed processing, decreased sequencing, and difficulty with command follow. Pt able to sit EoB for ~12 min progressing to min guard assist and able to participate in AAROM of LE and UE. At end of session pt reports he would like cantaloupe and ice cream. PT now recommending SNF level rehab at discharge. PT will continue to follow acutely.      Follow Up Recommendations  SNF     Equipment Recommendations   (TBD)       Precautions / Restrictions Precautions Precautions: Fall Precaution Comments: found down, Restrictions Weight Bearing Restrictions: No    Mobility  Bed Mobility Overal bed mobility: Needs Assistance Bed Mobility: Supine to Sit;Sit to Supine     Supine to sit: Total assist Sit to supine: Total assist   General bed mobility comments: pt continues to be very weak and only minimal movement of LE toward EoB and reach across body to rail, total A for management of LE off bed and trunk to upright, total A for return to bed and +2 to scoot up in bed    Transfers                 General transfer comment: pt requests not to sit in chair today  Ambulation/Gait             General Gait Details: unable       Balance Overall balance assessment: Needs assistance Sitting-balance support: Feet supported;Bilateral upper extremity supported Sitting balance-Leahy Scale: Fair Sitting balance - Comments: able to progress to seated  balance with no UE support with min guard. pt able to sit EoB for 12 min for AAROM Postural control: Left lateral lean                                  Cognition Arousal/Alertness: Awake/alert Behavior During Therapy: Flat affect   Area of Impairment: Following commands;Awareness;Problem solving                       Following Commands: Follows one step commands inconsistently;Follows one step commands with increased time     Problem Solving: Slow processing;Decreased initiation;Requires verbal cues;Requires tactile cues General Comments: continues to be minimally verbal and soft spoken, does state that he would like so cantaloupe and ice cream at end of session, pt with continued slow processing and sequencing      Exercises General Exercises - Upper Extremity Elbow Flexion: AAROM;Both;5 reps;Seated Elbow Extension: AAROM;Both;5 reps;Seated General Exercises - Lower Extremity Long Arc Quad: AAROM;Both;10 reps;Seated Hip Flexion/Marching: AAROM;Both;5 reps;Seated    General Comments General comments (skin integrity, edema, etc.): VSS on RA      Pertinent Vitals/Pain Pain Assessment: No/denies pain           PT Goals (current goals can now be found in the care plan section) Acute Rehab PT Goals Patient Stated Goal: none  stated PT Goal Formulation: Patient unable to participate in goal setting Time For Goal Achievement: 12/06/20 Potential to Achieve Goals: Fair    Frequency    Min 3X/week      PT Plan Discharge plan needs to be updated       AM-PAC PT "6 Clicks" Mobility   Outcome Measure  Help needed turning from your back to your side while in a flat bed without using bedrails?: A Lot Help needed moving from lying on your back to sitting on the side of a flat bed without using bedrails?: Total Help needed moving to and from a bed to a chair (including a wheelchair)?: Total Help needed standing up from a chair using your arms (e.g.,  wheelchair or bedside chair)?: Total Help needed to walk in hospital room?: Total Help needed climbing 3-5 steps with a railing? : Total 6 Click Score: 7    End of Session Equipment Utilized During Treatment: Gait belt Activity Tolerance: Patient tolerated treatment well Patient left: in bed;with call bell/phone within reach;with bed alarm set;Other (comment) (MD in room) Nurse Communication: Mobility status;Other (comment) (assit for eating breakfast) PT Visit Diagnosis: Unsteadiness on feet (R26.81);Other abnormalities of gait and mobility (R26.89);Repeated falls (R29.6);Muscle weakness (generalized) (M62.81);History of falling (Z91.81)     Time: 5809-9833 PT Time Calculation (min) (ACUTE ONLY): 24 min  Charges:  $Therapeutic Exercise: 8-22 mins $Therapeutic Activity: 8-22 mins                     Randal Goens B. Migdalia Dk PT, DPT Acute Rehabilitation Services Pager [phone] Office [phone]    Brielle 11/24/2020, 11:31 AM

## 2020-11-24 NOTE — Progress Notes (Signed)
Occupational Therapy Treatment Patient Details Name: [name] MRN: [id number] DOB: 05-Apr-1956 Today's Date: 11/24/2020    History of present illness [name] is a 65 y.o. 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 Pt with fatigue today, but agreeable to EOB activity. Pt continues to be minimally verbal and soft spoken. Pt required total A to sit EOB, min A for balance/support, mod A to don clean gown, min guard A for simple grooming. Pt sat EOB 5-6 minute before requesting to return to supine. OT will continue to follow acutely to maximize level of function and safety  Follow Up Recommendations  SNF;Supervision/Assistance - 24 hour    Equipment Recommendations  Other (comment) (TBD at SNF)    Recommendations for Other Services      Precautions / Restrictions Precautions Precautions: Fall Restrictions Weight Bearing Restrictions: No       Mobility Bed Mobility Overal bed mobility: Needs Assistance Bed Mobility: Supine to Sit;Sit to Supine     Supine to sit: Total assist Sit to supine: Total assist   General bed mobility comments: pt weak and fatigues easily, total A for trunk elevation and LE mgt    Transfers                 General transfer comment: pt wans to return to supine after sitting EOB x 7 minutes    Balance Overall balance assessment: Needs assistance Sitting-balance support: Feet supported;Bilateral upper extremity supported Sitting balance-Leahy Scale: Fair Sitting balance - Comments: min A for balance/support, sat EOB x 7 minutes                                   ADL either performed or assessed with clinical judgement   ADL Overall ADL's : Needs assistance/impaired     Grooming: Wash/dry hands;Wash/dry face;Sitting;Min  guard           Upper Body Dressing : Moderate assistance;Sitting Upper Body Dressing Details (indicate cue type and reason): donned clean gown seated EOB                   General ADL Comments: pt sat EOB x 7 minutes, pt required rest breaks with multiple cues to complete tasks. Pt requesting to return to supine after 5-6 minutes     Vision   Additional Comments: unable to propelry assess at this time   Perception     Praxis      Cognition Arousal/Alertness: Awake/alert Behavior During Therapy: Flat affect Overall Cognitive Status: No family/caregiver present to determine baseline cognitive functioning Area of Impairment: Following commands;Awareness;Problem solving                       Following Commands: Follows one step commands inconsistently;Follows one step commands with increased time     Problem Solving: Slow processing;Decreased initiation;Requires verbal cues;Requires tactile cues General Comments: continues to be minimally verbal and soft spoken and with continued slow processing and sequencing        Exercises     Shoulder Instructions       General Comments      Pertinent Vitals/ Pain       Pain Assessment: No/denies pain Faces Pain Scale: No hurt  Pain Intervention(s): Monitored during session;Repositioned  Home Living                                          Prior Functioning/Environment              Frequency  Min 2X/week        Progress Toward Goals  OT Goals(current goals can now be found in the care plan section)  Progress towards OT goals: OT to reassess next treatment  Acute Rehab OT Goals Patient Stated Goal: none stated  Plan Discharge plan remains appropriate;Discharge plan needs to be updated    Co-evaluation                 AM-PAC OT "6 Clicks" Daily Activity     Outcome Measure   Help from another person eating meals?: None Help from another person taking care of  personal grooming?: A Little Help from another person toileting, which includes using toliet, bedpan, or urinal?: Total Help from another person bathing (including washing, rinsing, drying)?: A Lot Help from another person to put on and taking off regular upper body clothing?: A Lot Help from another person to put on and taking off regular lower body clothing?: Total 6 Click Score: 13    End of Session    OT Visit Diagnosis: Unsteadiness on feet (R26.81);Other abnormalities of gait and mobility (R26.89);Muscle weakness (generalized) (M62.81);History of falling (Z91.81);Other symptoms and signs involving cognitive function;Cognitive communication deficit (R41.841)   Activity Tolerance Patient limited by fatigue   Patient Left with call bell/phone within reach;in bed;with bed alarm set   Nurse Communication          Time:  -     Charges: OT General Charges $OT Visit: 1 Visit OT Treatments $Therapeutic Activity: 8-22 mins    Britt Bottom 11/24/2020, 3:22 PM

## 2020-11-24 NOTE — Progress Notes (Signed)
Subjective:  No acute overnight events.  Reports feeling alright.  No changes since yesterday and denies any pain.  Is able to tell me his name and is oriented to place.  Objective: Vital signs in last 24 hours: Vitals:   11/23/20 2349 11/24/20 0425  BP: 115/60 123/66  Pulse: 62 60  Resp: 15 16  Temp: 98.1 F (36.7 C) 98 F (36.7 C)  SpO2: 100% 98%   General: Chronically ill-appearing elderly male sitting in bed, able to track objects, follows simple commands, NAD. Cardiovascular: Regular rate and normal rhythm, no murmurs rubs or gallops. Skin: Tense bullae overlying extensor surfaces of hands wrists lower extremities bilaterally.  No new lesions identified today. Neuro: Alert to self and place but unable to tell me why he is in the hospital.  Follows simple commands, able to squeeze fingers on command with symmetrical strength bilaterally.   Intake/Output Summary (Last 24 hours) at 11/24/2020 0702 Last data filed at 11/23/2020 1703 Gross per 24 hour  Intake --  Output 425 ml  Net -425 ml   Filed Weights   11/20/20 0412 11/22/20 0333 11/24/20 0500  Weight: 63.1 kg 58.8 kg 59 kg   Labs in last 24 hours: CBC Latest Ref Rng & Units 11/24/2020 11/23/2020 11/22/2020  WBC 4.0 - 10.5 K/uL 11.4(H) 11.6(H) 11.2(H)  Hemoglobin 13.0 - 17.0 g/dL 11.6(L) 12.7(L) 12.9(L)  Hematocrit 39.0 - 52.0 % 35.8(L) 40.5 41.0  Platelets 150 - 400 K/uL 380 376 314    CMP Latest Ref Rng & Units 11/23/2020 11/22/2020 11/21/2020  Glucose 70 - 99 mg/dL 91 105(H) 103(H)  BUN 8 - 23 mg/dL 13 17 19   Creatinine 0.61 - 1.24 mg/dL 0.93 1.02 1.01  Sodium 135 - 145 mmol/L 142 144 146(H)  Potassium 3.5 - 5.1 mmol/L 3.6 3.3(L) 3.6  Chloride 98 - 111 mmol/L 113(H) 113(H) 115(H)  CO2 22 - 32 mmol/L 24 22 21(L)  Calcium 8.9 - 10.3 mg/dL 8.4(L) 8.7(L) 8.3(L)  Total Protein 6.5 - 8.1 g/dL - - -  Total Bilirubin 0.3 - 1.2 mg/dL - - -  Alkaline Phos 38 - 126 U/L - - -  AST 15 - 41 U/L - - -  ALT 0 - 44 U/L - -  -    Assessment/Plan:  Active Problems:   Acute encephalopathy   Pressure injury of skin   Acute renal failure (HCC)   Traumatic rhabdomyolysis (HCC)   Bullous skin disease   Multiple cerebral infarctions (HCC)   Rectal cancer (HCC)   Acute combined systolic and diastolic heart failure (HCC)   Hyperkalemia   Tongue lesion  [name] is a 65 year old man with a history of AIDS and substance use admitted with acute encephalopathy found to have numerous new acute/subacute ischemic infarcts on MRI Brain and severe rhabdomyolysis.  #Acute encephalopathy #Multiple acute/subacute ischemic infarcts #Hypoxic ischemic injury Patient's encephalopathy appears to be continuing to improve gradually.  Unsure what his new baseline mental status will be given significant anoxic brain injury.  Still unable to contact any family in order to obtain consent for disposition planning and for potential punch biopsy for skin lesions.  Hopeful that patient will gradually become alert and oriented enough to have decision-making capacity.  If he does not develop decision-making capacity and and if we are unable to contact family, patient may require psychiatry evaluation for legal guardianship. - Continue DAPT x3 weeks then aspirin alone - Continue statin - Follow-up in stroke clinic with Dr. Leonie Man at [facility] in about 4 weeks - ID following, appreciate recommendations - Cardiology following, appreciate recommendations - PT OT recommending SNF placement  #Bullous Lesions No change in bullous lesions and no new lesions noted on exam.  ID following, appreciate recommendations.  Differentials include bullous pemphigoid, levamisole induced vasculitis, drug reaction, or porphyria cutanea tarda.  Punch biopsy would be needed for further evaluation.  Unfortunately attempts to contact family for consent of continue to be unsuccessful.  We will hold off on treatment until family can be contacted  if patient develops capacity make his own decisions. - Punch biopsy was consent can be obtained - Follow-up GC testing - Monitor for lesions   #History of rectal cancer Patient with history of rectal cancer.  He was having severe rectal pain with bleeding for about 6 months.  Patient spoke to Dr. [name] about this during their encounter prior to admission.  Awaiting medical records from [facility].  Will place consult today.  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  Continue carvedilol 3.125 mg twice daily and losartan 25 mg daily (titrate up as needed). Cardiology managing, appreciate assistance.  #Rhabdomyolysis - resolved Creatinine back to normal range at 0.93.   #Hypernatremia, resolved Patient's sodium of 141 on admission, trended up to 150.  Sodium now within normal range over the past 2 days, today at 142.  #AIDS -ID managing -Continue Biktarvy   LOS: 8 days   [name], MD 11/24/2020, 7:02 AM

## 2020-11-24 NOTE — TOC Progression Note (Signed)
Transition of Care [facility]) - Progression Note    Patient Details  Name: [name] MRN: [id number] Date of Birth: 07-Oct-1955  Transition of Care [facility]) CM/SW [place], LCSW Phone Number: 11/24/2020, 9:23 AM  Clinical Narrative:    Patient does not have any SNF beds offers at this time. Multiple social barriers exist.    Expected Discharge Plan: [place] Barriers to Discharge: Homeless with medical needs,Active Substance Use - Placement,SNF Pending bed offer  Expected Discharge Plan and Services Expected Discharge Plan: [place] In-house Referral: Clinical Social Work     Living arrangements for the past 2 months: No permanent address,Homeless                                       Social Determinants of Health (SDOH) Interventions    Readmission Risk Interventions No flowsheet data found.

## 2020-11-24 NOTE — Progress Notes (Signed)
Subjective:  No new complaints  Antibiotics:  Anti-infectives (From admission, onward)   Start     Dose/Rate Route Frequency Ordered Stop   11/24/20 1000  dolutegravir (TIVICAY) tablet 50 mg  Status:  Discontinued        50 mg Oral Daily 11/23/20 1140 11/23/20 1237   11/24/20 1000  emtricitabine-tenofovir AF (DESCOVY) 200-25 MG per tablet 1 tablet  Status:  Discontinued        1 tablet Oral Daily 11/23/20 1140 11/23/20 1237   11/24/20 0800  bictegravir-emtricitabine-tenofovir AF (BIKTARVY) 50-200-25 MG per tablet 1 tablet        1 tablet Oral Daily 11/23/20 1237     11/23/20 1000  dolutegravir (TIVICAY) tablet 50 mg  Status:  Discontinued        50 mg Per Tube Daily 11/22/20 1233 11/23/20 1140   11/23/20 1000  emtricitabine-tenofovir AF (DESCOVY) 200-25 MG per tablet 1 tablet  Status:  Discontinued        1 tablet Per Tube Daily 11/22/20 1233 11/23/20 1140   11/19/20 1800  acyclovir (ZOVIRAX) 600 mg in dextrose 5 % 250 mL IVPB  Status:  Discontinued        600 mg 262 mL/hr over 60 Minutes Intravenous Every 8 hours 11/19/20 0945 11/19/20 1357   11/18/20 2200  acyclovir (ZOVIRAX) 600 mg in dextrose 5 % 250 mL IVPB  Status:  Discontinued        600 mg 262 mL/hr over 60 Minutes Intravenous Every 12 hours 11/18/20 1139 11/19/20 0945   11/18/20 1630  bictegravir-emtricitabine-tenofovir AF (BIKTARVY) 50-200-25 MG per tablet 1 tablet  Status:  Discontinued        1 tablet Oral Daily 11/18/20 1539 11/22/20 1233   11/18/20 1000  cefTRIAXone (ROCEPHIN) 2 g in sodium chloride 0.9 % 100 mL IVPB  Status:  Discontinued        2 g 200 mL/hr over 30 Minutes Intravenous Every 12 hours 11/18/20 0651 11/18/20 0846   11/18/20 0928  acyclovir (ZOVIRAX) 600 mg in dextrose 5 % 250 mL IVPB  Status:  Discontinued        600 mg 262 mL/hr over 60 Minutes Intravenous Every 24 hours 11/17/20 1448 11/18/20 1139   11/17/20 1530  cefTRIAXone (ROCEPHIN) 2 g in sodium chloride 0.9 % 100 mL IVPB  Status:   Discontinued        2 g 200 mL/hr over 30 Minutes Intravenous Every 12 hours 11/17/20 1432 11/18/20 0651   11/17/20 1530  ampicillin (OMNIPEN) 2 g in sodium chloride 0.9 % 100 mL IVPB  Status:  Discontinued        2 g 300 mL/hr over 20 Minutes Intravenous Every 8 hours 11/17/20 1432 11/18/20 0651   11/17/20 1330  ceFEPIme (MAXIPIME) 2 g in sodium chloride 0.9 % 100 mL IVPB  Status:  Discontinued        2 g 200 mL/hr over 30 Minutes Intravenous Every 24 hours 11/16/20 1342 11/17/20 0647   11/17/20 1000  vancomycin (VANCOREADY) IVPB 750 mg/150 mL  Status:  Discontinued        750 mg 150 mL/hr over 60 Minutes Intravenous Every 24 hours 11/17/20 0654 11/17/20 0712   11/17/20 0800  ceFEPIme (MAXIPIME) 2 g in sodium chloride 0.9 % 100 mL IVPB  Status:  Discontinued        2 g 200 mL/hr over 30 Minutes Intravenous Every 24 hours 11/17/20 0647 11/17/20 1430   11/17/20  0745  vancomycin (VANCOREADY) IVPB 1000 mg/200 mL  Status:  Discontinued        1,000 mg 200 mL/hr over 60 Minutes Intravenous Every 8 hours 11/17/20 0647 11/17/20 0650   11/17/20 0745  vancomycin (VANCOREADY) IVPB 1250 mg/250 mL  Status:  Discontinued        1,250 mg 166.7 mL/hr over 90 Minutes Intravenous  Once 11/17/20 0654 11/17/20 0656   11/17/20 0730  acyclovir (ZOVIRAX) 600 mg in dextrose 5 % 250 mL IVPB  Status:  Discontinued        600 mg 262 mL/hr over 60 Minutes Intravenous Every 12 hours 11/17/20 0659 11/17/20 1448   11/17/20 0700  ampicillin (OMNIPEN) 2 g in sodium chloride 0.9 % 100 mL IVPB  Status:  Discontinued        2 g 300 mL/hr over 20 Minutes Intravenous Every 8 hours 11/17/20 0647 11/17/20 1430   11/17/20 0700  acyclovir (ZOVIRAX) 600 mg in dextrose 5 % 250 mL IVPB  Status:  Discontinued        600 mg 262 mL/hr over 60 Minutes Intravenous Every 24 hours 11/17/20 0647 11/17/20 0659   11/16/20 1345  vancomycin (VANCOREADY) IVPB 1250 mg/250 mL        1,250 mg 166.7 mL/hr over 90 Minutes Intravenous  Once  11/16/20 1331 11/16/20 1730   11/16/20 1335  vancomycin variable dose per unstable renal function (pharmacist dosing)  Status:  Discontinued         Does not apply See admin instructions 11/16/20 1335 11/16/20 2014   11/16/20 1330  ceFEPIme (MAXIPIME) 2 g in sodium chloride 0.9 % 100 mL IVPB        2 g 200 mL/hr over 30 Minutes Intravenous  Once 11/16/20 1322 11/16/20 1505   11/16/20 1330  metroNIDAZOLE (FLAGYL) IVPB 500 mg        500 mg 100 mL/hr over 60 Minutes Intravenous  Once 11/16/20 1322 11/16/20 1505   11/16/20 1330  vancomycin (VANCOREADY) IVPB 1000 mg/200 mL  Status:  Discontinued        1,000 mg 200 mL/hr over 60 Minutes Intravenous  Once 11/16/20 1322 11/16/20 1331      Medications: Scheduled Meds: . aspirin  81 mg Oral Daily  . atorvastatin  40 mg Oral Daily  . bictegravir-emtricitabine-tenofovir AF  1 tablet Oral Daily  . carvedilol  3.125 mg Per Tube BID WC  . Chlorhexidine Gluconate Cloth  6 each Topical Daily  . clopidogrel  75 mg Oral Daily  . enoxaparin (LOVENOX) injection  40 mg Subcutaneous Q24H  . feeding supplement  237 mL Oral TID BM  . losartan  25 mg Oral Daily  . multivitamin with minerals  1 tablet Oral Daily  . sodium chloride flush  3 mL Intravenous Q12H   Continuous Infusions:  PRN Meds:.acetaminophen **OR** acetaminophen    Objective: Weight change:   Intake/Output Summary (Last 24 hours) at 11/24/2020 1541 Last data filed at 11/24/2020 1359 Gross per 24 hour  Intake --  Output 925 ml  Net -925 ml   Blood pressure 123/66, pulse 60, temperature 98 F (36.7 C), temperature source Axillary, resp. rate 16, height 5\' 7"  (1.702 m), weight 59 kg, SpO2 98 %. Temp:  [98 F (36.7 C)-98.3 F (36.8 C)] 98 F (36.7 C) (04/20 0425) Pulse Rate:  [60-78] 60 (04/20 0425) Resp:  [15-17] 16 (04/20 0425) BP: (115-137)/(60-77) 123/66 (04/20 0425) SpO2:  [98 %-100 %] 98 % (04/20 0425) Weight:  [59 kg]  59 kg (04/20 0500)  Physical Exam: Physical  Exam Constitutional:      Appearance: [name] is cachectic.  Eyes:     General:        Right eye: No discharge.        Left eye: No discharge.     Extraocular Movements: Extraocular movements intact.     Conjunctiva/sclera: Conjunctivae normal.  Cardiovascular:     Rate and Rhythm: Tachycardia present.     Heart sounds: No murmur heard. No friction rub. No gallop.   Pulmonary:     Effort: Pulmonary effort is normal. No respiratory distress.     Breath sounds: No stridor. No wheezing or rhonchi.  Abdominal:     General: Abdomen is flat. There is no distension.  Skin:    General: Skin is warm.     Findings: Rash present.  Neurological:     General: No focal deficit present.     Mental Status: [name] is alert.     Comments: [name] was oriented to being in the hospital And being in [place] [name] did not recognize me  Psychiatric:        Speech: Speech is delayed.        Behavior: Behavior is cooperative.        Cognition and Memory: Cognition is impaired. Memory is impaired.        CBC:    BMET Recent Labs    11/23/20 0030 11/24/20 0236  NA 142 137  K 3.6 3.6  CL 113* 108  CO2 24 21*  GLUCOSE 91 78  BUN 13 13  CREATININE 0.93 0.84  CALCIUM 8.4* 7.8*     Liver Panel  No results for input(s): PROT, ALBUMIN, AST, ALT, ALKPHOS, BILITOT, BILIDIR, IBILI in the last 72 hours.   Oral lesion 11/22/2020:      OP lesion 11/19/2020:      Bullous lesions now with some more vasculitis changes    Perianal bullous changes      Sedimentation Rate No results for input(s): ESRSEDRATE in the last 72 hours. C-Reactive Protein No results for input(s): CRP in the last 72 hours.  Micro Results: Recent Results (from the past 720 hour(s))  Resp Panel by RT-PCR (Flu A&B, Covid) Nasopharyngeal Swab     Status: None   Collection Time: 11/16/20 11:28 AM   Specimen: Nasopharyngeal Swab; Nasopharyngeal(NP) swabs in vial transport medium  Result Value Ref Range Status   SARS  Coronavirus 2 by RT PCR NEGATIVE NEGATIVE Final    Comment: (NOTE) SARS-CoV-2 target nucleic acids are NOT DETECTED.  The SARS-CoV-2 RNA is generally detectable in upper respiratory specimens during the acute phase of infection. The lowest concentration of SARS-CoV-2 viral copies this assay can detect is 138 copies/mL. A negative result does not preclude SARS-Cov-2 infection and should not be used as the sole basis for treatment or other patient management decisions. A negative result may occur with  improper specimen collection/handling, submission of specimen other than nasopharyngeal swab, presence of viral mutation(s) within the areas targeted by this assay, and inadequate number of viral copies(<138 copies/mL). A negative result must be combined with clinical observations, patient history, and epidemiological information. The expected result is Negative.  Fact Sheet for Patients:  EntrepreneurPulse.com.au  Fact Sheet for Healthcare Providers:  IncredibleEmployment.be  This test is no t yet approved or cleared by the [place] FDA and  has been authorized for detection and/or diagnosis of SARS-CoV-2 by FDA under an Emergency Use Authorization (EUA). This  EUA will remain  in effect (meaning this test can be used) for the duration of the COVID-19 declaration under Section 564(b)(1) of the Act, 21 U.S.C.section 360bbb-3(b)(1), unless the authorization is terminated  or revoked sooner.       Influenza A by PCR NEGATIVE NEGATIVE Final   Influenza B by PCR NEGATIVE NEGATIVE Final    Comment: (NOTE) The Xpert Xpress SARS-CoV-2/FLU/RSV plus assay is intended as an aid in the diagnosis of influenza from Nasopharyngeal swab specimens and should not be used as a sole basis for treatment. Nasal washings and aspirates are unacceptable for Xpert Xpress SARS-CoV-2/FLU/RSV testing.  Fact Sheet for  Patients: EntrepreneurPulse.com.au  Fact Sheet for Healthcare Providers: IncredibleEmployment.be  This test is not yet approved or cleared by the [place] FDA and has been authorized for detection and/or diagnosis of SARS-CoV-2 by FDA under an Emergency Use Authorization (EUA). This EUA will remain in effect (meaning this test can be used) for the duration of the COVID-19 declaration under Section 564(b)(1) of the Act, 21 U.S.C. section 360bbb-3(b)(1), unless the authorization is terminated or revoked.  Performed at [facility], [place] [address], [place], [place] [address]   Culture, blood (routine x 2)     Status: None   Collection Time: 11/16/20 11:33 AM   Specimen: BLOOD  Result Value Ref Range Status   Specimen Description BLOOD SITE NOT SPECIFIED  Final   Special Requests   Final    BOTTLES DRAWN AEROBIC AND ANAEROBIC Blood Culture results may not be optimal due to an inadequate volume of blood received in culture bottles   Culture   Final    NO GROWTH 5 DAYS Performed at [facility], [place] [address], [place], [place] [address]    Report Status 11/21/2020 FINAL  Final  Culture, blood (routine x 2)     Status: None   Collection Time: 11/16/20 11:33 AM   Specimen: BLOOD  Result Value Ref Range Status   Specimen Description BLOOD SITE NOT SPECIFIED  Final   Special Requests   Final    BOTTLES DRAWN AEROBIC AND ANAEROBIC Blood Culture results may not be optimal due to an inadequate volume of blood received in culture bottles   Culture   Final    NO GROWTH 5 DAYS Performed at [facility], [place] [address], [place], [place] [address]    Report Status 11/21/2020 FINAL  Final  Fungus Culture With Stain     Status: None (Preliminary result)   Collection Time: 11/17/20  4:31 PM  Result Value Ref Range Status   Fungus Stain Final report  Final    Comment: (NOTE) Performed At: [facility] [place], [place] [id number] Rush Farmer MD SA:[id number]    Fungus (Mycology) Culture PENDING  Incomplete   Fungal Source FLUID  Final    Comment: Performed at [facility], [place] [address], [place], [place] [address]  CSF culture w Stat Gram Stain     Status: None   Collection Time: 11/17/20  4:31 PM   Specimen: CSF; Cerebrospinal Fluid  Result Value Ref Range Status   Specimen Description CSF  Final   Special Requests NONE  Final   Gram Stain NO WBC SEEN NO ORGANISMS SEEN CYTOSPIN SMEAR   Final   Culture   Final    NO GROWTH 3 DAYS Performed at [facility], [place] [address] [place],  [address]    Report Status 11/21/2020  FINAL  Final  Fungus Culture Result     Status: None   Collection Time: 11/17/20  4:31 PM  Result Value Ref Range Status   Result 1 Comment  Final    Comment: (NOTE) KOH/Calcofluor preparation:  no fungus observed. Performed At: [facility] [place], [place] [id number] Rush Farmer MD QA:[id number]     Studies/Results: No results found.    Assessment/Plan:  INTERVAL HISTORY   Patient has some improvement in neurological status but still not able to make decisions for himself  Active Problems:   Acute encephalopathy   Pressure injury of skin   Acute renal failure (HCC)   Traumatic rhabdomyolysis (HCC)   Bullous skin disease   Multiple cerebral infarctions (HCC)   Rectal cancer (HCC)   Acute combined systolic and diastolic heart failure (HCC)   Hyperkalemia   Tongue lesion    [name] is a 65 y.o. male with HIV, hepatitis C coinfection alcohol and cocaine abuse history of rectal cancer who was found down after not having been seen for 24 hours.  [name] was brought to the emergency department where [name] was found to be encephalopathic at attended with acute renal failure and rhabdomyolysis.  [name] also has new bullous skin lesions on hands and legs.  MRI of the brain showed  mild multiple infarcts  as well as some anoxic changes.  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  #1  Encephalopathy: Likely multifactorial This does not appear to be of an infectious etiology.  [name] also seems to be getting some progress   #2 multiple infarcts: Being worked up by neurology. Would repeat MRI with contrast add anything? [name] had one without contrast Caryl Pina in the context of [name] renal failure\\  #3  Rhabdomyolysis: Resolved along with [name] renal failure  #4 HIV disease:  Is now swallowing pills again and we have switching back to [place]  Will check HIV VL and CD4 again today   #5 Bullous skin lesions:   I wondered about porphyria cutanea tarda but also wonder but other possibilities such as live [name] is all induced vasculitis given [name] cocaine abuse.  Rash does appear to be evolving and seem to be no new ones cumulating  #7 Tongue lesion: Agree that it has an aphthous ulcer type appearance.  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 Would assume in the ensuing 6 years of untreated hepatitis C and continued alcohol abuse that it is likely worse  We have been trying to initiate therapy but [name] had not been good about coming to [name] appointments.  At [name] next outpatient therapy appointment I will initiate hepatitis C treatment and hope that [name] can complete a 2 to 3 months course  #9Problems with polysubstance abuse and depression:  Needs to reengage with counseling.  #10 Hx of rectal cancer and severe rectal pain with bleeding only on for well over 6 months.  [name] told me before in clinici that [name] had been seen by  [facility]  surgery that but that they "did nothing" I do not know if [name] had HRA done> Would make sure we have [facility] seem [name] while [name] is in the hospital once [name] hopefully recovers from [name] encephalopathy. At present talk to me today [name] does not recall this history.   #11  Homelessness: Chia called me yesterday  that [name] lives wherever [name]  can.  #12 Competency: [name] is clearly not alert enough to build to make decisions for himself unfortunately team and had a great deal of difficulty getting in touch with family.  [name] told me that [name] sister is the best person to get in touch with but I understand that she has been very difficult to get in touch with.     LOS: 8 days   Alcide Evener 11/24/2020, 3:41 PM

## 2020-11-24 NOTE — Progress Notes (Addendum)
  Speech Language Pathology Treatment: Dysphagia  Patient Details Name: [name] MRN: [id number] DOB: October 02, 1955 Today's Date: 11/24/2020 Time: 2778-2423 SLP Time Calculation (min) (ACUTE ONLY): 14.58 min  Assessment / Plan / Recommendation Clinical Impression  Pt was seen for dysphagia treatment. He was alert and cooperative during the session. Jason Mourning, RN reported that the pt has been tolerating the current diet without difficulty and that full supervision has been given to encourage p.o. intake. No s/sx of aspiration were noted with solids or liquids via cup even with large bolus sizes of solids. Mastication was functional with dysphagia 3 solids and oral clearance was adequate without need for a liquid wash. Regular texture solids was mildly prolonged with mild oral residue. Pt's diet will be upgraded to dysphagia 3 solids and thin liquids via cup. SLP will continue to follow pt.    HPI HPI: [name] is a 65 y.o. 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 He was found to be encephalopathic at attended with acute renal failure and rhabdomyolysis.  He also has new bullous skin lesions on hands and legs. Pt was found to have multiple infarcts and anoxic changes on MRI 4/12.  CXR 4/12 with no acute findings. MBS 4/16: mild oropharyngeal dysphagia c/b prolonged mastication, premature spillage, delayed swallow initiation, and reduced base of tongue retraction.  These deficits resulted in trace, transient penetration of thin liquid by straw prior to the swallow.      SLP Plan  Continue with current plan of care       Recommendations  Diet recommendations: Dysphagia 3 (mechanical soft);Thin liquid Liquids provided via: Cup;No straw Medication Administration: Crushed with puree Supervision: Staff to assist with self feeding Compensations: Slow  rate;Small sips/bites;Minimize environmental distractions Postural Changes and/or Swallow Maneuvers: Seated upright 90 degrees                Oral Care Recommendations: Oral care BID;Staff/trained caregiver to provide oral care Follow up Recommendations:  (TBD) SLP Visit Diagnosis: Dysphagia, oropharyngeal phase (R13.12) Plan: Continue with current plan of care       [name] I. [name], [place], [place] Office number [phone] Pager [place] 11/24/2020, 5:37 PM

## 2020-11-25 ENCOUNTER — Other Ambulatory Visit (HOSPITAL_COMMUNITY): Payer: Self-pay

## 2020-11-25 DIAGNOSIS — N179 Acute kidney failure, unspecified: Secondary | ICD-10-CM | POA: Diagnosis not present

## 2020-11-25 DIAGNOSIS — L139 Bullous disorder, unspecified: Secondary | ICD-10-CM | POA: Diagnosis not present

## 2020-11-25 DIAGNOSIS — I5041 Acute combined systolic (congestive) and diastolic (congestive) heart failure: Secondary | ICD-10-CM | POA: Diagnosis not present

## 2020-11-25 DIAGNOSIS — E875 Hyperkalemia: Secondary | ICD-10-CM | POA: Diagnosis not present

## 2020-11-25 LAB — BASIC METABOLIC PANEL
Anion gap: 7 (ref 5–15)
BUN: 12 mg/dL (ref 8–23)
CO2: 22 mmol/L (ref 22–32)
Calcium: 8 mg/dL — ABNORMAL LOW (ref 8.9–10.3)
Chloride: 106 mmol/L (ref 98–111)
Creatinine, Ser: 0.83 mg/dL (ref 0.61–1.24)
GFR, Estimated: >60 mL/min (ref >60)
Glucose, Bld: 84 mg/dL (ref 70–99)
Potassium: 3.9 mmol/L (ref 3.5–5.1)
Sodium: 135 mmol/L (ref 135–145)

## 2020-11-25 LAB — CD4/CD8 (T-HELPER/T-SUPPRESSOR CELL)
CD4 absolute: 337 /uL — ABNORMAL LOW (ref 400–1790)
CD4%: 24 % — ABNORMAL LOW (ref 33–65)
CD8 T Cell Abs: 674 /uL (ref 190–1000)
CD8tox: 48 % — ABNORMAL HIGH (ref 12–40)
Ratio: 0.5 — ABNORMAL LOW (ref 1.0–3.0)
Total lymphocyte count: 1393 /uL (ref 1000–4000)

## 2020-11-25 LAB — CBC WITH DIFFERENTIAL/PLATELET
Abs Immature Granulocytes: 0.2 10*3/uL — ABNORMAL HIGH (ref 0.00–0.07)
Basophils Absolute: 0.1 10*3/uL (ref 0.0–0.1)
Basophils Relative: 1 %
Eosinophils Absolute: 0.1 10*3/uL (ref 0.0–0.5)
Eosinophils Relative: 1 %
HCT: 38.7 % — ABNORMAL LOW (ref 39.0–52.0)
Hemoglobin: 12.4 g/dL — ABNORMAL LOW (ref 13.0–17.0)
Immature Granulocytes: 2 %
Lymphocytes Relative: 15 %
Lymphs Abs: 1.6 10*3/uL (ref 0.7–4.0)
MCH: 29.3 pg (ref 26.0–34.0)
MCHC: 32 g/dL (ref 30.0–36.0)
MCV: 91.5 fL (ref 80.0–100.0)
Monocytes Absolute: 0.9 10*3/uL (ref 0.1–1.0)
Monocytes Relative: 9 %
Neutro Abs: 7.6 10*3/uL (ref 1.7–7.7)
Neutrophils Relative %: 72 %
Platelets: 437 10*3/uL — ABNORMAL HIGH (ref 150–400)
RBC: 4.23 MIL/uL (ref 4.22–5.81)
RDW: 13.5 % (ref 11.5–15.5)
WBC: 10.6 10*3/uL — ABNORMAL HIGH (ref 4.0–10.5)
nRBC: 0 % (ref 0.0–0.2)

## 2020-11-25 LAB — HIV-1 RNA QUANT-NO REFLEX-BLD
HIV 1 RNA Quant: 2250 copies/mL
LOG10 HIV-1 RNA: 3.352 log10copy/mL

## 2020-11-25 LAB — GLUCOSE, CAPILLARY: Glucose-Capillary: 81 mg/dL (ref 70–99)

## 2020-11-25 MED ORDER — BICTEGRAVIR-EMTRICITAB-TENOFOV 50-200-25 MG PO TABS
1.0000 | ORAL_TABLET | Freq: Every day | ORAL | 0 refills | Status: DC
  Filled 2020-11-25: qty 30, 30d supply, fill #0

## 2020-11-25 NOTE — Progress Notes (Addendum)
Subjective:  No acute overnight events.  [name] reports feeling good today.  Having breakfast.  He does report having rectal pain.  He is a lot more interactive today.  RN notified team that patient had a bloody bowel movement this morning.  Attempted to contact brother but unsuccessful, left voicemail.  ADDENDUM: Spoke with patient's brother, [name]. Discussed if there would be anyone who could be patient's legal guardian in order to make decisions on his behalf as patient does not have decision making capacity at this time. He states that he will discuss this with his siblings and try to have an answer by tomorrow. Will contact [name] again tomorrow.  Objective: Vital signs in last 24 hours: Vitals:   11/25/20 0341 11/25/20 0755  BP: (!) 142/70 121/75  Pulse: 67 66  Resp: 18 17  Temp: 97.6 F (36.4 C) 98.6 F (37 C)  SpO2: 98% 100%   General: Chronically ill-appearing elderly male sitting in bed, able to track objects, NAD. Cardiovascular: Regular rate and normal rhythm, no murmurs rubs or gallops. Skin: Bullae unchanged from prior exam, no new lesions identified today. Neuro: Alert to self and place but unable to tell me why he is in the hospital and unable to recall events leading up to hospitalization.  Follows simple commands, symmetrical strength bilaterally.   Intake/Output Summary (Last 24 hours) at 11/25/2020 0839 Last data filed at 11/25/2020 0630 Gross per 24 hour  Intake --  Output 1020 ml  Net -1020 ml   Filed Weights   11/22/20 0333 11/24/20 0500 11/25/20 0341  Weight: 58.8 kg 59 kg 57.2 kg   Labs in last 24 hours: CBC Latest Ref Rng & Units 11/25/2020 11/24/2020 11/23/2020  WBC 4.0 - 10.5 K/uL 10.6(H) 11.4(H) 11.6(H)  Hemoglobin 13.0 - 17.0 g/dL 12.4(L) 11.6(L) 12.7(L)  Hematocrit 39.0 - 52.0 % 38.7(L) 35.8(L) 40.5  Platelets 150 - 400 K/uL 437(H) 380 376    CMP Latest Ref Rng & Units 11/25/2020 11/24/2020 11/23/2020  Glucose 70 - 99 mg/dL 84 78 91   BUN 8 - 23 mg/dL 12 13 13   Creatinine 0.61 - 1.24 mg/dL 0.83 0.84 0.93  Sodium 135 - 145 mmol/L 135 137 142  Potassium 3.5 - 5.1 mmol/L 3.9 3.6 3.6  Chloride 98 - 111 mmol/L 106 108 113(H)  CO2 22 - 32 mmol/L 22 21(L) 24  Calcium 8.9 - 10.3 mg/dL 8.0(L) 7.8(L) 8.4(L)  Total Protein 6.5 - 8.1 g/dL - - -  Total Bilirubin 0.3 - 1.2 mg/dL - - -  Alkaline Phos 38 - 126 U/L - - -  AST 15 - 41 U/L - - -  ALT 0 - 44 U/L - - -    Assessment/Plan:  Active Problems:   Acute encephalopathy   Pressure injury of skin   Acute renal failure (HCC)   Traumatic rhabdomyolysis (HCC)   Bullous skin disease   Multiple cerebral infarctions (HCC)   Rectal cancer (HCC)   Acute combined systolic and diastolic heart failure (HCC)   Hyperkalemia   Tongue lesion  [name] is a 65 year old man with a history of AIDS and substance use admitted with acute encephalopathy found to have numerous new acute/subacute ischemic infarcts on MRI Brain and severe rhabdomyolysis.  #Acute encephalopathy #Multiple acute/subacute ischemic infarcts #Hypoxic ischemic injury Patient's encephalopathy appears to be gradually improving although unsure what his new baseline mental status will be.  Unable to contact family today, will attempt again in order to determine if there is  any family who will be patient's legal guardian in case patient does not develop decision-making capacity.  Otherwise may require psychiatry evaluation for legal guardianship. - Continue DAPT x3 weeks then aspirin alone - Continue statin - Follow-up in stroke clinic with Dr. Leonie [facility] at [facility] in about 4 weeks - ID following, appreciate recommendations - PT OT recommending SNF placement but will need consent - Advanced to dysphagia 3 diet  #Bullous Lesions Bullous lesions unchanged from yesterday and no new lesions seen today.  ID following, appreciate recommendations.  Punch biopsy would be needed for further evaluation to  determine etiology.  Unfortunately still have been unable to contact family. -Follow-up GC  #History of rectal cancer Patient with history of rectal cancer.  Had a bloody bowel movement this morning and is complaining of rectal pain.  Consulted general surgery for further evaluation of possible recurrence of rectal cancer, greatly appreciate assistance.  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 Cardiology managing, appreciate assistance.  #Rhabdomyolysis - resolved  #Hypernatremia, resolved  #AIDS -ID managing -Continue Biktarvy   LOS: 9 days   [name], MD 11/25/2020, 8:39 AM

## 2020-11-25 NOTE — Progress Notes (Signed)
Patient had small to medium size bright red bowel movement at this time. Will notify MD

## 2020-11-25 NOTE — Consult Note (Signed)
[name] 01/02/1956  [id number].    Requesting MD: Dr. Kathleen Argue  Chief Complaint/Reason for Consult: rectal bleeding, hx of anal SCC  HPI: [name] is a 65 y.o. with a hx of HIV and AIDS, HCV, HTN, HLD, tobacco use and anal squamous cell carcinoma who we were asked to see for rectal pain and bleeding.   Patient anal squamous cell carcinoma originally presented as a mass versus hemorrhoid that was prolapsing 01/30/2011. He underwent excision of this by Dr. Rise Patience. His PET scan 03/2011 demonstrated hypermetabolism in the anus. There were enlarged/equivocal external iliac and bilateral inguinal lymph nodes that did have some hypermetabolism. He reports that he received chemoradiation. He had developed fecal incontinence, intermittent bloody stool and has had significant amounts of discomfort in the anal area for the last couple of years. He was seen in our office by Dr. Dema Severin 09/06/20 for anal pain. Dr. Dema Severin recommended MRI pelvis and referral to GI for a colonoscopy as he had never had one before.  Patient ended up having MRI pelvis done in the ED on 10/23/20 when he presented with worsening of his anal pain. The MRI pelvis was done without contrast and showed no acute findings, no evidence of rectal mass or pelvic metastatic disease, no evidence of perianal fistula or abscess. Patient has not yet had a colonoscopy.  Patient currently admitted for multiple acute/subacute ischemic infarcts since 4/12 after presenting with AMS. He has since been placed on ASA/Plavix and redeveloped rectal bleeding. We were asked to see for this.   ROS: Review of Systems  Unable to perform ROS: Mental status change  Gastrointestinal: Positive for blood in stool.       Fecal incontinence     Family History  Family history unknown: Yes    Past Medical History:  Diagnosis Date  . Acute kidney failure    unspec, secondary to medications  . AIDS ([place]) 06/07/2016  . Alcohol abuse  05/03/2016  . anal ca dx'd 02/2011  . Cachexia ([place])   . 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 Drug abuse ([place])   . GERD (gastroesophageal reflux disease) 08/09/2016  . Grieving 11/29/2017  . Hemorrhoid   . Hepatitis C   . History of radiation therapy 01/01/2011 -05/18/11   anal, pelvis, inguinal lymph nodes  . HIV (human immunodeficiency virus infection) ([place])   . HIV or AIDS   . Housing problems 10/22/2020  . Hypertension   . Loose stools 06/07/2016  . Mass of anus 01/26/2011   squamous cell cancer  . Meningitis   . Mood disorder ([place])   . Pain    right leg  . Pulmonary nodule   . Rash and nonspecific skin eruption 03/31/2015  . Rectal bleeding 08/09/2020  . Testicular hypofunction     Past Surgical History:  Procedure Laterality Date  . BUBBLE STUDY  11/19/2020   Procedure: BUBBLE STUDY;  Surgeon: [name], MD;  Location: [place] [facility];  Service: Cardiology;;  . MASS EXCISION  01/26/2011   squamous cell carcinoma  . TEE WITHOUT CARDIOVERSION N/A 11/19/2020   Procedure: TRANSESOPHAGEAL ECHOCARDIOGRAM (TEE);  Surgeon: [name], MD;  Location: [facility];  Service: Cardiology;  Laterality: N/A;    Social History:  reports that he has been smoking. He has never used smokeless tobacco. He reports current alcohol use of about 6.0 standard drinks of alcohol per week. He reports previous drug use.  Allergies: No Known Allergies  Medications Prior to Admission  Medication  Sig Dispense Refill  . clotrimazole (LOTRIMIN) 1 % cream Apply to affected area 2 times daily 15 g 0  . Darunavir-Cobicisctat-Emtricitabine-Tenofovir Alafenamide (SYMTUZA) 800-150-200-10 MG TABS Take 1 tablet by mouth daily with breakfast. 30 tablet 3  . omeprazole (PRILOSEC) 40 MG capsule Take 1 capsule (40 mg total) by mouth daily. 30 capsule 3  . oxyCODONE-acetaminophen (PERCOCET/ROXICET) 5-325 MG tablet Take 2 tablets by mouth every 6 (six) hours as needed for severe pain. 12 tablet 0     Physical  Exam: Blood pressure 129/75, pulse 88, temperature 98.8 F (37.1 C), temperature source Oral, resp. rate 16, height 5\' 7"  (1.702 m), weight 57.2 kg, SpO2 97 %. General: chronically ill appearing AA male who is laying in bed in NAD HEENT: head is normocephalic, atraumatic.  Sclera are noninjected.  Pupils equal and round.  Ears and nose without any masses or lesions.  Mouth is pink and moist. Dentition fair Heart: regular, rate, and rhythm.  Normal s1,s2. No obvious murmurs, gallops, or rubs noted.  Palpable pedal pulses bilaterally Lungs: CTAB, no wheezes, rhonchi, or rales noted.  Respiratory effort nonlabored Abd: Soft, NT/ND, +BS, no masses, hernias, or organomegaly GU: chronic thickening/scarring of the perianal tissue, poor rectal tone, no palpable mass on rectal exam MS: no BUE/BLE edema, calves soft and nontender Skin: Scattered bullous lesions as noted in ID's note, mostly covered with cdi dressings. Otherwise warm and dry with no masses, lesions, or rashes Psych: A&Ox0, would not answer questions or speak for me Neuro: Moves all extremities, follows one step commands, gait not assessed.   Results for orders placed or performed during the hospital encounter of 11/16/20 (from the past 48 hour(s))  Glucose, capillary     Status: Abnormal   Collection Time: 11/23/20  5:08 PM  Result Value Ref Range   Glucose-Capillary 205 (H) 70 - 99 mg/dL    Comment: Glucose reference range applies only to samples taken after fasting for at least 8 hours.  CBC with Differential/Platelet     Status: Abnormal   Collection Time: 11/24/20  2:36 AM  Result Value Ref Range   WBC 11.4 (H) 4.0 - 10.5 K/uL   RBC 3.92 (L) 4.22 - 5.81 MIL/uL   Hemoglobin 11.6 (L) 13.0 - 17.0 g/dL   HCT 35.8 (L) 39.0 - 52.0 %   MCV 91.3 80.0 - 100.0 fL   MCH 29.6 26.0 - 34.0 pg   MCHC 32.4 30.0 - 36.0 g/dL   RDW 13.9 11.5 - 15.5 %   Platelets 380 150 - 400 K/uL   nRBC 0.0 0.0 - 0.2 %   Neutrophils Relative % 77 %   Neutro  Abs 8.7 (H) 1.7 - 7.7 K/uL   Lymphocytes Relative 13 %   Lymphs Abs 1.5 0.7 - 4.0 K/uL   Monocytes Relative 7 %   Monocytes Absolute 0.8 0.1 - 1.0 K/uL   Eosinophils Relative 1 %   Eosinophils Absolute 0.1 0.0 - 0.5 K/uL   Basophils Relative 0 %   Basophils Absolute 0.1 0.0 - 0.1 K/uL   Immature Granulocytes 2 %   Abs Immature Granulocytes 0.24 (H) 0.00 - 0.07 K/uL    Comment: Performed at [facility], [address], [place], [place] [address]  Basic metabolic panel     Status: Abnormal   Collection Time: 11/24/20  2:36 AM  Result Value Ref Range   Sodium 137 135 - 145 mmol/L   Potassium 3.6 3.5 - 5.1 mmol/L   Chloride  108 98 - 111 mmol/L   CO2 21 (L) 22 - 32 mmol/L   Glucose, Bld 78 70 - 99 mg/dL    Comment: Glucose reference range applies only to samples taken after fasting for at least 8 hours.   BUN 13 8 - 23 mg/dL   Creatinine, Ser 0.84 0.61 - 1.24 mg/dL   Calcium 7.8 (L) 8.9 - 10.3 mg/dL   GFR, Estimated >60 >60 mL/min    Comment: (NOTE) Calculated using the CKD-EPI Creatinine Equation (2021)    Anion gap 8 5 - 15    Comment: Performed at [facility] [address] [place], [place] [address]  Glucose, capillary     Status: None   Collection Time: 11/24/20  8:03 AM  Result Value Ref Range   Glucose-Capillary 94 70 - 99 mg/dL    Comment: Glucose reference range applies only to samples taken after fasting for at least 8 hours.  HIV-1 RNA quant-no reflex-bld     Status: None   Collection Time: 11/24/20  4:48 PM  Result Value Ref Range   HIV 1 RNA Quant 2,250 copies/mL    Comment: (NOTE) The reportable range for this assay is 20 to 10,000,000 copies HIV-1 RNA/mL.    LOG10 HIV-1 RNA 3.352 log10copy/mL    Comment: (NOTE) Performed At: [facility] [place], [place] [id number] Rush Farmer MD JM:[id number]   CBC with Differential/Platelet     Status: Abnormal   Collection Time: 11/25/20 12:25 AM  Result Value Ref Range   WBC  10.6 (H) 4.0 - 10.5 K/uL   RBC 4.23 4.22 - 5.81 MIL/uL   Hemoglobin 12.4 (L) 13.0 - 17.0 g/dL   HCT 38.7 (L) 39.0 - 52.0 %   MCV 91.5 80.0 - 100.0 fL   MCH 29.3 26.0 - 34.0 pg   MCHC 32.0 30.0 - 36.0 g/dL   RDW 13.5 11.5 - 15.5 %   Platelets 437 (H) 150 - 400 K/uL   nRBC 0.0 0.0 - 0.2 %   Neutrophils Relative % 72 %   Neutro Abs 7.6 1.7 - 7.7 K/uL   Lymphocytes Relative 15 %   Lymphs Abs 1.6 0.7 - 4.0 K/uL   Monocytes Relative 9 %   Monocytes Absolute 0.9 0.1 - 1.0 K/uL   Eosinophils Relative 1 %   Eosinophils Absolute 0.1 0.0 - 0.5 K/uL   Basophils Relative 1 %   Basophils Absolute 0.1 0.0 - 0.1 K/uL   Immature Granulocytes 2 %   Abs Immature Granulocytes 0.20 (H) 0.00 - 0.07 K/uL    Comment: Performed at [facility], [place] [address], [place],  [address]  Basic metabolic panel     Status: Abnormal   Collection Time: 11/25/20 12:25 AM  Result Value Ref Range   Sodium 135 135 - 145 mmol/L   Potassium 3.9 3.5 - 5.1 mmol/L   Chloride 106 98 - 111 mmol/L   CO2 22 22 - 32 mmol/L   Glucose, Bld 84 70 - 99 mg/dL    Comment: Glucose reference range applies only to samples taken after fasting for at least 8 hours.   BUN 12 8 - 23 mg/dL   Creatinine, Ser 0.83 0.61 - 1.24 mg/dL   Calcium 8.0 (L) 8.9 - 10.3 mg/dL   GFR, Estimated >60 >60 mL/min    Comment: (NOTE) Calculated using the CKD-EPI Creatinine Equation (2021)    Anion gap 7 5 - 15    Comment: Performed at  [facility], [place] [address], [place], [place] [address]  Glucose, capillary     Status: None   Collection Time: 11/25/20  7:58 AM  Result Value Ref Range   Glucose-Capillary 81 70 - 99 mg/dL    Comment: Glucose reference range applies only to samples taken after fasting for at least 8 hours.  Cd4/cd8 (t-helper/t-suppressor cell)     Status: Abnormal   Collection Time: 11/25/20  9:58 AM  Result Value Ref Range   Total lymphocyte count 1,393 1,000 - 4,000 /uL   CD4% 24 (L) 33 - 65 %   CD4 absolute  337 (L) 400 - 1,790 /uL   CD8tox 48 (H) 12 - 40 %   CD8 T Cell Abs 674 190 - 1,000 /uL   Ratio 0.50 (L) 1.0 - 3.0    Comment: Performed at [facility], [place] [address], [place], [place] [address]   No results found.  Anti-infectives (From admission, onward)   Start     Dose/Rate Route Frequency Ordered Stop   11/25/20 0000  bictegravir-emtricitabine-tenofovir AF (BIKTARVY) 50-200-25 MG TABS tablet        1 tablet Oral Daily 11/25/20 0939     11/24/20 1000  dolutegravir (TIVICAY) tablet 50 mg  Status:  Discontinued        50 mg Oral Daily 11/23/20 1140 11/23/20 1237   11/24/20 1000  emtricitabine-tenofovir AF (DESCOVY) 200-25 MG per tablet 1 tablet  Status:  Discontinued        1 tablet Oral Daily 11/23/20 1140 11/23/20 1237   11/24/20 0800  bictegravir-emtricitabine-tenofovir AF (BIKTARVY) 50-200-25 MG per tablet 1 tablet        1 tablet Oral Daily 11/23/20 1237     11/23/20 1000  dolutegravir (TIVICAY) tablet 50 mg  Status:  Discontinued        50 mg Per Tube Daily 11/22/20 1233 11/23/20 1140   11/23/20 1000  emtricitabine-tenofovir AF (DESCOVY) 200-25 MG per tablet 1 tablet  Status:  Discontinued        1 tablet Per Tube Daily 11/22/20 1233 11/23/20 1140   11/19/20 1800  acyclovir (ZOVIRAX) 600 mg in dextrose 5 % 250 mL IVPB  Status:  Discontinued        600 mg 262 mL/hr over 60 Minutes Intravenous Every 8 hours 11/19/20 0945 11/19/20 1357   11/18/20 2200  acyclovir (ZOVIRAX) 600 mg in dextrose 5 % 250 mL IVPB  Status:  Discontinued        600 mg 262 mL/hr over 60 Minutes Intravenous Every 12 hours 11/18/20 1139 11/19/20 0945   11/18/20 1630  bictegravir-emtricitabine-tenofovir AF (BIKTARVY) 50-200-25 MG per tablet 1 tablet  Status:  Discontinued        1 tablet Oral Daily 11/18/20 1539 11/22/20 1233   11/18/20 1000  cefTRIAXone (ROCEPHIN) 2 g in sodium chloride 0.9 % 100 mL IVPB  Status:  Discontinued        2 g 200 mL/hr over 30 Minutes Intravenous Every 12  hours 11/18/20 0651 11/18/20 0846   11/18/20 0928  acyclovir (ZOVIRAX) 600 mg in dextrose 5 % 250 mL IVPB  Status:  Discontinued        600 mg 262 mL/hr over 60 Minutes Intravenous Every 24 hours 11/17/20 1448 11/18/20 1139   11/17/20 1530  cefTRIAXone (ROCEPHIN) 2 g in sodium chloride 0.9 % 100 mL IVPB  Status:  Discontinued        2 g 200 mL/hr over 30 Minutes  Intravenous Every 12 hours 11/17/20 1432 11/18/20 0651   11/17/20 1530  ampicillin (OMNIPEN) 2 g in sodium chloride 0.9 % 100 mL IVPB  Status:  Discontinued        2 g 300 mL/hr over 20 Minutes Intravenous Every 8 hours 11/17/20 1432 11/18/20 0651   11/17/20 1330  ceFEPIme (MAXIPIME) 2 g in sodium chloride 0.9 % 100 mL IVPB  Status:  Discontinued        2 g 200 mL/hr over 30 Minutes Intravenous Every 24 hours 11/16/20 1342 11/17/20 0647   11/17/20 1000  vancomycin (VANCOREADY) IVPB 750 mg/150 mL  Status:  Discontinued        750 mg 150 mL/hr over 60 Minutes Intravenous Every 24 hours 11/17/20 0654 11/17/20 0712   11/17/20 0800  ceFEPIme (MAXIPIME) 2 g in sodium chloride 0.9 % 100 mL IVPB  Status:  Discontinued        2 g 200 mL/hr over 30 Minutes Intravenous Every 24 hours 11/17/20 0647 11/17/20 1430   11/17/20 0745  vancomycin (VANCOREADY) IVPB 1000 mg/200 mL  Status:  Discontinued        1,000 mg 200 mL/hr over 60 Minutes Intravenous Every 8 hours 11/17/20 0647 11/17/20 0650   11/17/20 0745  vancomycin (VANCOREADY) IVPB 1250 mg/250 mL  Status:  Discontinued        1,250 mg 166.7 mL/hr over 90 Minutes Intravenous  Once 11/17/20 0654 11/17/20 0656   11/17/20 0730  acyclovir (ZOVIRAX) 600 mg in dextrose 5 % 250 mL IVPB  Status:  Discontinued        600 mg 262 mL/hr over 60 Minutes Intravenous Every 12 hours 11/17/20 0659 11/17/20 1448   11/17/20 0700  ampicillin (OMNIPEN) 2 g in sodium chloride 0.9 % 100 mL IVPB  Status:  Discontinued        2 g 300 mL/hr over 20 Minutes Intravenous Every 8 hours 11/17/20 0647 11/17/20 1430    11/17/20 0700  acyclovir (ZOVIRAX) 600 mg in dextrose 5 % 250 mL IVPB  Status:  Discontinued        600 mg 262 mL/hr over 60 Minutes Intravenous Every 24 hours 11/17/20 0647 11/17/20 0659   11/16/20 1345  vancomycin (VANCOREADY) IVPB 1250 mg/250 mL        1,250 mg 166.7 mL/hr over 90 Minutes Intravenous  Once 11/16/20 1331 11/16/20 1730   11/16/20 1335  vancomycin variable dose per unstable renal function (pharmacist dosing)  Status:  Discontinued         Does not apply See admin instructions 11/16/20 1335 11/16/20 2014   11/16/20 1330  ceFEPIme (MAXIPIME) 2 g in sodium chloride 0.9 % 100 mL IVPB        2 g 200 mL/hr over 30 Minutes Intravenous  Once 11/16/20 1322 11/16/20 1505   11/16/20 1330  metroNIDAZOLE (FLAGYL) IVPB 500 mg        500 mg 100 mL/hr over 60 Minutes Intravenous  Once 11/16/20 1322 11/16/20 1505   11/16/20 1330  vancomycin (VANCOREADY) IVPB 1000 mg/200 mL  Status:  Discontinued        1,000 mg 200 mL/hr over 60 Minutes Intravenous  Once 11/16/20 1322 11/16/20 1331     Assessment/Plan HIV/AIDS Hx HCV HTN HLD Tobacco abuse HFmrEF (EF 45-50%) Acute encephalopathy, multiple acute/subacute ischemic infarcts - on plavix x3 weeks, then aspirin alone  Hx anal squamous cell carcinoma - s/p excision 2012 followed by chemo/radiation Chronic rectal pain Hematochezia  Fecal incontinence  - MR  pelvis 10/23/20 showed no evidence of recurrent cancer. Patient has not yet undergone recommended colonoscopy. With his bloody stools would recommend reaching out to GI to see if this could be arranged, although I'm not sure it has to be done during this admission.  Ultimately if colonoscopy also negative for malignancy, bleeding could be coming from skin breakdown. Patient is not a good surgical candidate due to immunosuppression, and now with his acute strokes it would be ill-advised to undergo general anesthesia in the short term, but diverting ostomy could ultimately be considered to  allow tissue healing and to help with fecal incontinence.   Wellington Hampshire, [facility] 11/25/2020, 3:19 PM Please see Amion for pager number during day hours 7:00am-4:30pm

## 2020-11-25 NOTE — Plan of Care (Signed)
  Problem: Education: Goal: Knowledge of General Education information will improve Description: Including pain rating scale, medication(s)/side effects and non-pharmacologic comfort measures Outcome: Progressing   Problem: Health Behavior/Discharge Planning: Goal: Ability to manage health-related needs will improve Outcome: Progressing   Problem: Clinical Measurements: Goal: Ability to maintain clinical measurements within normal limits will improve Outcome: Progressing Goal: Will remain free from infection Outcome: Progressing Goal: Diagnostic test results will improve Outcome: Progressing Goal: Respiratory complications will improve Outcome: Progressing Goal: Cardiovascular complication will be avoided Outcome: Progressing   Problem: Activity: Goal: Risk for activity intolerance will decrease Outcome: Progressing   Problem: Nutrition: Goal: Adequate nutrition will be maintained Outcome: Progressing   Problem: Coping: Goal: Level of anxiety will decrease Outcome: Progressing   Problem: Elimination: Goal: Will not experience complications related to bowel motility Outcome: Progressing Goal: Will not experience complications related to urinary retention Outcome: Progressing   Problem: Safety: Goal: Ability to remain free from injury will improve Outcome: Progressing   Problem: Skin Integrity: Goal: Risk for impaired skin integrity will decrease Outcome: Progressing   Problem: Education: Goal: Knowledge of disease or condition will improve Outcome: Progressing Goal: Knowledge of secondary prevention will improve Outcome: Progressing Goal: Knowledge of patient specific risk factors addressed and post discharge goals established will improve Outcome: Progressing Goal: Individualized Educational Video(s) Outcome: Progressing   Problem: Coping: Goal: Will identify appropriate support needs Outcome: Progressing   Problem: Nutrition: Goal: Risk of aspiration  will decrease Outcome: Progressing Goal: Dietary intake will improve Outcome: Progressing   Problem: Ischemic Stroke/TIA Tissue Perfusion: Goal: Complications of ischemic stroke/TIA will be minimized Outcome: Progressing   Problem: Spontaneous Subarachnoid Hemorrhage Tissue Perfusion: Goal: Complications of Spontaneous Subarachnoid Hemorrhage will be minimized Outcome: Progressing

## 2020-11-25 NOTE — TOC Progression Note (Signed)
Transition of Care [facility]) - Progression Note    Patient Details  Name: [name] MRN: [id number] Date of Birth: November 18, 1955  [facility] of [facility]) CM/SW [place], LCSW Phone Number: 11/25/2020, 2:24 PM  Clinical Narrative:    9am-CSW contacted [facility] to see if they could accept patient as they offered in the Hub. Per Lorenso Courier, she will review patient to make sure they can accept but the patient would need to bring in his Biktarvy medication as it is too expensive for the SNF to fill. CSW spoke with patient's brother. He reported being unfamiliar with that medication and he is not sure what pharmacy normally sends him the medications. CSW appreciates ID pharmacy assistance; they are able to have [place] the first 30 day supply for patient to take with him and then have it mailed from [employer] from that point onwards. CSW awaiting response from [place].    2:25pm-CSW still waiting on response from [place].    Expected Discharge Plan: Skilled Nursing Facility Barriers to Discharge: Homeless with medical needs,Active Substance Use - Placement,SNF Pending bed offer  Expected Discharge Plan and Services Expected Discharge Plan: [place] In-house Referral: Clinical Social Work   Post Acute Care Choice: [place] Living arrangements for the past 2 months: No permanent address,Homeless                                       Social Determinants of Health (SDOH) Interventions    Readmission Risk Interventions No flowsheet data found.

## 2020-11-25 NOTE — Progress Notes (Addendum)
Subjective:  No new complaints  Antibiotics:  Anti-infectives (From admission, onward)   Start     Dose/Rate Route Frequency Ordered Stop   11/25/20 0000  bictegravir-emtricitabine-tenofovir AF (BIKTARVY) 50-200-25 MG TABS tablet        1 tablet Oral Daily 11/25/20 0939     11/24/20 1000  dolutegravir (TIVICAY) tablet 50 mg  Status:  Discontinued        50 mg Oral Daily 11/23/20 1140 11/23/20 1237   11/24/20 1000  emtricitabine-tenofovir AF (DESCOVY) 200-25 MG per tablet 1 tablet  Status:  Discontinued        1 tablet Oral Daily 11/23/20 1140 11/23/20 1237   11/24/20 0800  bictegravir-emtricitabine-tenofovir AF (BIKTARVY) 50-200-25 MG per tablet 1 tablet        1 tablet Oral Daily 11/23/20 1237     11/23/20 1000  dolutegravir (TIVICAY) tablet 50 mg  Status:  Discontinued        50 mg Per Tube Daily 11/22/20 1233 11/23/20 1140   11/23/20 1000  emtricitabine-tenofovir AF (DESCOVY) 200-25 MG per tablet 1 tablet  Status:  Discontinued        1 tablet Per Tube Daily 11/22/20 1233 11/23/20 1140   11/19/20 1800  acyclovir (ZOVIRAX) 600 mg in dextrose 5 % 250 mL IVPB  Status:  Discontinued        600 mg 262 mL/hr over 60 Minutes Intravenous Every 8 hours 11/19/20 0945 11/19/20 1357   11/18/20 2200  acyclovir (ZOVIRAX) 600 mg in dextrose 5 % 250 mL IVPB  Status:  Discontinued        600 mg 262 mL/hr over 60 Minutes Intravenous Every 12 hours 11/18/20 1139 11/19/20 0945   11/18/20 1630  bictegravir-emtricitabine-tenofovir AF (BIKTARVY) 50-200-25 MG per tablet 1 tablet  Status:  Discontinued        1 tablet Oral Daily 11/18/20 1539 11/22/20 1233   11/18/20 1000  cefTRIAXone (ROCEPHIN) 2 g in sodium chloride 0.9 % 100 mL IVPB  Status:  Discontinued        2 g 200 mL/hr over 30 Minutes Intravenous Every 12 hours 11/18/20 0651 11/18/20 0846   11/18/20 0928  acyclovir (ZOVIRAX) 600 mg in dextrose 5 % 250 mL IVPB  Status:  Discontinued        600 mg 262 mL/hr over 60 Minutes Intravenous  Every 24 hours 11/17/20 1448 11/18/20 1139   11/17/20 1530  cefTRIAXone (ROCEPHIN) 2 g in sodium chloride 0.9 % 100 mL IVPB  Status:  Discontinued        2 g 200 mL/hr over 30 Minutes Intravenous Every 12 hours 11/17/20 1432 11/18/20 0651   11/17/20 1530  ampicillin (OMNIPEN) 2 g in sodium chloride 0.9 % 100 mL IVPB  Status:  Discontinued        2 g 300 mL/hr over 20 Minutes Intravenous Every 8 hours 11/17/20 1432 11/18/20 0651   11/17/20 1330  ceFEPIme (MAXIPIME) 2 g in sodium chloride 0.9 % 100 mL IVPB  Status:  Discontinued        2 g 200 mL/hr over 30 Minutes Intravenous Every 24 hours 11/16/20 1342 11/17/20 0647   11/17/20 1000  vancomycin (VANCOREADY) IVPB 750 mg/150 mL  Status:  Discontinued        750 mg 150 mL/hr over 60 Minutes Intravenous Every 24 hours 11/17/20 0654 11/17/20 0712   11/17/20 0800  ceFEPIme (MAXIPIME) 2 g in sodium chloride 0.9 % 100 mL IVPB  Status:  Discontinued        2 g 200 mL/hr over 30 Minutes Intravenous Every 24 hours 11/17/20 0647 11/17/20 1430   11/17/20 0745  vancomycin (VANCOREADY) IVPB 1000 mg/200 mL  Status:  Discontinued        1,000 mg 200 mL/hr over 60 Minutes Intravenous Every 8 hours 11/17/20 0647 11/17/20 0650   11/17/20 0745  vancomycin (VANCOREADY) IVPB 1250 mg/250 mL  Status:  Discontinued        1,250 mg 166.7 mL/hr over 90 Minutes Intravenous  Once 11/17/20 0654 11/17/20 0656   11/17/20 0730  acyclovir (ZOVIRAX) 600 mg in dextrose 5 % 250 mL IVPB  Status:  Discontinued        600 mg 262 mL/hr over 60 Minutes Intravenous Every 12 hours 11/17/20 0659 11/17/20 1448   11/17/20 0700  ampicillin (OMNIPEN) 2 g in sodium chloride 0.9 % 100 mL IVPB  Status:  Discontinued        2 g 300 mL/hr over 20 Minutes Intravenous Every 8 hours 11/17/20 0647 11/17/20 1430   11/17/20 0700  acyclovir (ZOVIRAX) 600 mg in dextrose 5 % 250 mL IVPB  Status:  Discontinued        600 mg 262 mL/hr over 60 Minutes Intravenous Every 24 hours 11/17/20 0647 11/17/20  0659   11/16/20 1345  vancomycin (VANCOREADY) IVPB 1250 mg/250 mL        1,250 mg 166.7 mL/hr over 90 Minutes Intravenous  Once 11/16/20 1331 11/16/20 1730   11/16/20 1335  vancomycin variable dose per unstable renal function (pharmacist dosing)  Status:  Discontinued         Does not apply See admin instructions 11/16/20 1335 11/16/20 2014   11/16/20 1330  ceFEPIme (MAXIPIME) 2 g in sodium chloride 0.9 % 100 mL IVPB        2 g 200 mL/hr over 30 Minutes Intravenous  Once 11/16/20 1322 11/16/20 1505   11/16/20 1330  metroNIDAZOLE (FLAGYL) IVPB 500 mg        500 mg 100 mL/hr over 60 Minutes Intravenous  Once 11/16/20 1322 11/16/20 1505   11/16/20 1330  vancomycin (VANCOREADY) IVPB 1000 mg/200 mL  Status:  Discontinued        1,000 mg 200 mL/hr over 60 Minutes Intravenous  Once 11/16/20 1322 11/16/20 1331      Medications: Scheduled Meds: . aspirin  81 mg Oral Daily  . atorvastatin  40 mg Oral Daily  . bictegravir-emtricitabine-tenofovir AF  1 tablet Oral Daily  . carvedilol  3.125 mg Per Tube BID WC  . Chlorhexidine Gluconate Cloth  6 each Topical Daily  . clopidogrel  75 mg Oral Daily  . enoxaparin (LOVENOX) injection  40 mg Subcutaneous Q24H  . feeding supplement  237 mL Oral TID BM  . losartan  25 mg Oral Daily  . multivitamin with minerals  1 tablet Oral Daily  . sodium chloride flush  3 mL Intravenous Q12H   Continuous Infusions:  PRN Meds:.acetaminophen **OR** acetaminophen    Objective: Weight change: -1.8 kg  Intake/Output Summary (Last 24 hours) at 11/25/2020 1314 Last data filed at 11/25/2020 0630 Gross per 24 hour  Intake --  Output 1020 ml  Net -1020 ml   Blood pressure 129/75, pulse 88, temperature 98.8 F (37.1 C), temperature source Oral, resp. rate 16, height 5\' 7"  (1.702 m), weight 57.2 kg, SpO2 97 %. Temp:  [97.6 F (36.4 C)-98.8 F (37.1 C)] 98.8 F (37.1 C) (04/21 1219) Pulse Rate:  [65-88]  88 (04/21 1219) Resp:  [16-18] 16 (04/21 1219) BP:  (107-142)/(58-75) 129/75 (04/21 1219) SpO2:  [97 %-100 %] 97 % (04/21 1219) Weight:  [57.2 kg] 57.2 kg (04/21 0341)  Physical Exam: Physical Exam Constitutional:      Appearance: He is cachectic.  Eyes:     General:        Right eye: No discharge.        Left eye: No discharge.     Extraocular Movements: Extraocular movements intact.     Conjunctiva/sclera: Conjunctivae normal.  Cardiovascular:     Rate and Rhythm: Tachycardia present.     Heart sounds: No murmur heard. No friction rub. No gallop.   Pulmonary:     Effort: Pulmonary effort is normal. No respiratory distress.     Breath sounds: No stridor. No wheezing or rhonchi.  Abdominal:     General: Abdomen is flat. There is no distension.  Skin:    General: Skin is warm.     Findings: Rash present.  Neurological:     General: No focal deficit present.     Mental Status: He is alert.     Comments: [name] was less interactive today  Psychiatric:        Speech: Speech is delayed.        Behavior: Behavior is cooperative.        Cognition and Memory: Cognition is impaired. Memory is impaired.        CBC:    BMET Recent Labs    11/24/20 0236 11/25/20 0025  NA 137 135  K 3.6 3.9  CL 108 106  CO2 21* 22  GLUCOSE 78 84  BUN 13 12  CREATININE 0.84 0.83  CALCIUM 7.8* 8.0*     Liver Panel  No results for input(s): PROT, ALBUMIN, AST, ALT, ALKPHOS, BILITOT, BILIDIR, IBILI in the last 72 hours.   Oral lesion 11/22/2020:      OP lesion 11/19/2020:      Bullous lesions now with some more vasculitis changes    Perianal bullous changes      Sedimentation Rate No results for input(s): ESRSEDRATE in the last 72 hours. C-Reactive Protein No results for input(s): CRP in the last 72 hours.  Micro Results: Recent Results (from the past 720 hour(s))  Resp Panel by RT-PCR (Flu A&B, Covid) Nasopharyngeal Swab     Status: None   Collection Time: 11/16/20 11:28 AM   Specimen: Nasopharyngeal Swab;  Nasopharyngeal(NP) swabs in vial transport medium  Result Value Ref Range Status   SARS Coronavirus 2 by RT PCR NEGATIVE NEGATIVE Final    Comment: (NOTE) SARS-CoV-2 target nucleic acids are NOT DETECTED.  The SARS-CoV-2 RNA is generally detectable in upper respiratory specimens during the acute phase of infection. The lowest concentration of SARS-CoV-2 viral copies this assay can detect is 138 copies/mL. A negative result does not preclude SARS-Cov-2 infection and should not be used as the sole basis for treatment or other patient management decisions. A negative result may occur with  improper specimen collection/handling, submission of specimen other than nasopharyngeal swab, presence of viral mutation(s) within the areas targeted by this assay, and inadequate number of viral copies(<138 copies/mL). A negative result must be combined with clinical observations, patient history, and epidemiological information. The expected result is Negative.  Fact Sheet for Patients:  EntrepreneurPulse.com.au  Fact Sheet for Healthcare Providers:  IncredibleEmployment.be  This test is no t yet approved or cleared by the [place] and  has been authorized  for detection and/or diagnosis of SARS-CoV-2 by FDA under an Emergency Use Authorization (EUA). This EUA will remain  in effect (meaning this test can be used) for the duration of the COVID-19 declaration under Section 564(b)(1) of the Act, 21 U.S.C.section 360bbb-3(b)(1), unless the authorization is terminated  or revoked sooner.       Influenza A by PCR NEGATIVE NEGATIVE Final   Influenza B by PCR NEGATIVE NEGATIVE Final    Comment: (NOTE) The Xpert Xpress SARS-CoV-2/FLU/RSV plus assay is intended as an aid in the diagnosis of influenza from Nasopharyngeal swab specimens and should not be used as a sole basis for treatment. Nasal washings and aspirates are unacceptable for Xpert Xpress  SARS-CoV-2/FLU/RSV testing.  Fact Sheet for Patients: EntrepreneurPulse.com.au  Fact Sheet for Healthcare Providers: IncredibleEmployment.be  This test is not yet approved or cleared by the [place] FDA and has been authorized for detection and/or diagnosis of SARS-CoV-2 by FDA under an Emergency Use Authorization (EUA). This EUA will remain in effect (meaning this test can be used) for the duration of the COVID-19 declaration under Section 564(b)(1) of the Act, 21 U.S.C. section 360bbb-3(b)(1), unless the authorization is terminated or revoked.  Performed at [facility], [place] [address] [place], [place] [address]   Culture, blood (routine x 2)     Status: None   Collection Time: 11/16/20 11:33 AM   Specimen: BLOOD  Result Value Ref Range Status   Specimen Description BLOOD SITE NOT SPECIFIED  Final   Special Requests   Final    BOTTLES DRAWN AEROBIC AND ANAEROBIC Blood Culture results may not be optimal due to an inadequate volume of blood received in culture bottles   Culture   Final    NO GROWTH 5 DAYS Performed at [facility], [place] [address] [place], [place] [address]    Report Status 11/21/2020 FINAL  Final  Culture, blood (routine x 2)     Status: None   Collection Time: 11/16/20 11:33 AM   Specimen: BLOOD  Result Value Ref Range Status   Specimen Description BLOOD SITE NOT SPECIFIED  Final   Special Requests   Final    BOTTLES DRAWN AEROBIC AND ANAEROBIC Blood Culture results may not be optimal due to an inadequate volume of blood received in culture bottles   Culture   Final    NO GROWTH 5 DAYS Performed at [facility], [place] [address], [place], [place] [address]    Report Status 11/21/2020 FINAL  Final  Fungus Culture With Stain     Status: None (Preliminary result)   Collection Time: 11/17/20  4:31 PM  Result Value Ref Range Status   Fungus Stain Final report  Final    Comment: (NOTE) Performed At:  [facility] [place], [place] [id number] Rush Farmer MD IO:[id number]    Fungus (Mycology) Culture PENDING  Incomplete   Fungal Source FLUID  Final    Comment: Performed at [facility], [place] [address] [place] [address], [place], [place] [address]  CSF culture w Stat Gram Stain     Status: None   Collection Time: 11/17/20  4:31 PM   Specimen: CSF; Cerebrospinal Fluid  Result Value Ref Range Status   Specimen Description CSF  Final   Special Requests NONE  Final   Gram Stain NO WBC SEEN NO ORGANISMS SEEN CYTOSPIN SMEAR   Final   Culture   Final    NO GROWTH 3 DAYS Performed at [facility], [place] [address] [place], [place] [address]    Report Status 11/21/2020 FINAL  Final  Fungus Culture Result     Status: None   Collection Time: 11/17/20  4:31 PM  Result Value Ref Range Status   Result 1 Comment  Final    Comment: (NOTE) KOH/Calcofluor preparation:  no fungus observed. Performed At: [facility] [place], [place] [id number] Rush Farmer MD PX:[id number]     Studies/Results: No results found.    Assessment/Plan:  INTERVAL HISTORY   My understanding is arrangements were being made for him to go to skilled nursing facility but they would not take him if they had to pay for his BIKTARVY despite the fact that he has Medicare and SPAP  Active Problems:   Acute encephalopathy   Pressure injury of skin   Acute renal failure (HCC)   Traumatic rhabdomyolysis ([place])   Bullous skin disease   Multiple cerebral infarctions ([place])   Rectal cancer ([place])   Acute combined systolic and diastolic heart failure ([place])   Hyperkalemia   Tongue lesion    [name] is a 65 y.o. male with HIV, hepatitis C coinfection alcohol and cocaine abuse history of rectal cancer who was found down after not having been seen for 24 hours.  He was brought to the emergency department where he was found to be encephalopathic at attended with acute  renal failure and rhabdomyolysis.  He also has new bullous skin lesions on hands and legs.  MRI of the brain showed  mild multiple infarcts as well as some anoxic changes.  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  #1  Encephalopathy: Likely multifactorial This does not appear to be of an infectious etiology.  He also seems to be getting some progress   #2 multiple infarcts: Being worked up by neurology. Would repeat MRI with contrast add anything? He had one without contrast Caryl Pina in the context of his renal failure\\  #3  Rhabdomyolysis: Resolved along with his renal failure  #4 HIV disease:  Is now swallowing pills again and we have switching back to [place]  Will check HIV VL and CD4 again today   #5 Bullous skin lesions:   I wondered about porphyria cutanea tarda but also wonder but other possibilities such as live him is all induced vasculitis given his cocaine abuse.  Rash does appear to be evolving and seem to be no new ones cumulating  #7 Tongue lesion: Agree that it has an aphthous ulcer type appearance.  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 Would assume in the ensuing 6 years of untreated hepatitis C and continued alcohol abuse that it is likely worse  We have been trying to initiate therapy but he had not been good about coming to his appointments.  At his next outpatient therapy appointment I will initiate hepatitis C treatment and hope that he can complete a 2 to 3 months course  #9Problems with polysubstance abuse and depression:  Needs to reengage with counseling.  #10 Hx of rectal cancer and severe rectal pain with bleeding only on for well over 6 months.  Yesterday he told me that he did not remember this.  Apparently I just noticed the nursing notes/making note of bright red blood per rectum.   #11 Competency: [name] is clearly not alert  enough to build to make decisions for  himself unfortunately team and had a great deal of difficulty getting in touch with family.  He told me that his sister is the best person to get in touch with but I understand that she has been very difficult to get in touch with.   [name] has an appointment on  12/03/2020 at [place] with Dr. [name]  Should arrive 15 to 30 minutes prior to the appointment.  The [facility] is located in the [facility] at  [place] in [place].  [address], which is located to the left of the elevators.  Phone: [phone]  Fax: [phone]  [email or web address]  NOTE IF [name] CANNOT MAKE THIS APPT DUE TO STILL BEING IN THE HOSPITAL, PLEASE LET ME KNOW SO THAT WE CAN RESCHEDULE HIM  I will sign off for now as there does not appear to be any acute infectious disease needs.  Please call with further questions.    LOS: 9 days   Alcide Evener 11/25/2020, 1:14 PM

## 2020-11-25 NOTE — Care Management Important Message (Signed)
Important Message  Patient Details  Name: [name] MRN: [id number] Date of Birth: 01/13/1956   Medicare Important Message Given:  Yes - Important Message mailed due to current National Emergency  Verbal consent obtained due to current National Emergency  Relationship to patient: Self Contact Name: Andrw Call Date: 11/25/20  Time: 1435 Phone: [phone] Outcome: No Answer/Busy Important Message mailed to: Patient address on file    [name] 11/25/2020, 2:35 PM

## 2020-11-25 NOTE — Progress Notes (Signed)
Nutrition Follow-up  DOCUMENTATION CODES:   Severe malnutrition in context of chronic illness  INTERVENTION:    Continue Ensure Enlive po TID, each supplement provides 350 kcal and 20 grams of protein.  Continue MVI with minerals daily.  Continue Magic cup TID with meals, each supplement provides 290 kcal and 9 grams of protein  NUTRITION DIAGNOSIS:   Severe Malnutrition related to chronic illness (AIDS, hepatitis C, alcohol & drug abuse) as evidenced by severe muscle depletion,severe fat depletion.  Ongoing   GOAL:   Patient will meet greater than or equal to 90% of their needs  Progressing   MONITOR:   PO intake,Supplement acceptance,Labs  REASON FOR ASSESSMENT:   Consult Enteral/tube feeding initiation and management  ASSESSMENT:   Pt admitted with acute metabolic encephalopathy 2/2 multiple acute/subacute ischemic infarcts. 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   Spoke with patient at bedside, but he did not answer many of my questions. He was stirring his orange juice with a fork. Breakfast tray in front of him ~25% intake. He said that he likes the ensure supplements, one was sitting on his table.  SLP following for dysphagia. Diet advanced to dysphagia 3 with thin liquids yesterday. Meal intakes recorded at 25-75%.  Labs and medications reviewed.   Admission weight 60.8 kg, currently 57.2 kg.  Nutrition Focused Physical Exam:  Flowsheet Row Most Recent Value  Orbital Region Severe depletion  Upper Arm Region Moderate depletion  Thoracic and Lumbar Region Severe depletion  Buccal Region Moderate depletion  Temple Region Severe depletion  Clavicle Bone Region Severe depletion  Clavicle and Acromion Bone Region Severe depletion  Scapular Bone Region Severe depletion  Dorsal Hand Moderate depletion  Patellar Region Moderate depletion  Anterior Thigh Region Moderate  depletion  Posterior Calf Region Moderate depletion  Edema (RD Assessment) None  Hair Reviewed  Eyes Reviewed  Mouth Reviewed  Skin Reviewed  Nails Reviewed       Diet Order:   Diet Order            DIET DYS 3 Room service appropriate? Yes with Assist; Fluid consistency: Thin  Diet effective now                 EDUCATION NEEDS:   No education needs have been identified at this time  Skin:  Skin Assessment: Skin Integrity Issues: Skin Integrity Issues:: Stage II Stage II: medial cervical, medial  buttocks, L knee, L thigh  Last BM:  4/21 type 7  Height:   Ht Readings from Last 1 Encounters:  11/16/20 5\' 7"  (1.702 m)    Weight:   Wt Readings from Last 1 Encounters:  11/25/20 57.2 kg    Ideal Body Weight:     BMI:  Body mass index is 19.75 kg/m.  Estimated Nutritional Needs:   Kcal:  1900-2100  Protein:  95-105 grams  Fluid:  >1.9L/d    Lucas Mallow, RD, LDN, CNSC Please refer to Amion for contact information.

## 2020-11-26 DIAGNOSIS — R578 Other shock: Secondary | ICD-10-CM | POA: Diagnosis not present

## 2020-11-26 DIAGNOSIS — E43 Unspecified severe protein-calorie malnutrition: Secondary | ICD-10-CM | POA: Insufficient documentation

## 2020-11-26 DIAGNOSIS — K625 Hemorrhage of anus and rectum: Secondary | ICD-10-CM

## 2020-11-26 DIAGNOSIS — G931 Anoxic brain damage, not elsewhere classified: Secondary | ICD-10-CM

## 2020-11-26 DIAGNOSIS — C20 Malignant neoplasm of rectum: Secondary | ICD-10-CM | POA: Diagnosis not present

## 2020-11-26 LAB — CBC WITH DIFFERENTIAL/PLATELET
Abs Immature Granulocytes: 0.26 10*3/uL — ABNORMAL HIGH (ref 0.00–0.07)
Basophils Absolute: 0.1 10*3/uL (ref 0.0–0.1)
Basophils Relative: 1 %
Eosinophils Absolute: 0.1 10*3/uL (ref 0.0–0.5)
Eosinophils Relative: 1 %
HCT: 38.2 % — ABNORMAL LOW (ref 39.0–52.0)
Hemoglobin: 12.3 g/dL — ABNORMAL LOW (ref 13.0–17.0)
Immature Granulocytes: 2 %
Lymphocytes Relative: 15 %
Lymphs Abs: 1.7 10*3/uL (ref 0.7–4.0)
MCH: 29 pg (ref 26.0–34.0)
MCHC: 32.2 g/dL (ref 30.0–36.0)
MCV: 90.1 fL (ref 80.0–100.0)
Monocytes Absolute: 1.1 10*3/uL — ABNORMAL HIGH (ref 0.1–1.0)
Monocytes Relative: 10 %
Neutro Abs: 8.2 10*3/uL — ABNORMAL HIGH (ref 1.7–7.7)
Neutrophils Relative %: 71 %
Platelets: 565 10*3/uL — ABNORMAL HIGH (ref 150–400)
RBC: 4.24 MIL/uL (ref 4.22–5.81)
RDW: 13.5 % (ref 11.5–15.5)
WBC: 11.4 10*3/uL — ABNORMAL HIGH (ref 4.0–10.5)
nRBC: 0 % (ref 0.0–0.2)

## 2020-11-26 LAB — HEMOGLOBIN AND HEMATOCRIT, BLOOD
HCT: 34.8 % — ABNORMAL LOW (ref 39.0–52.0)
HCT: 30.3 % — ABNORMAL LOW (ref 39.0–52.0)
Hemoglobin: 11.2 g/dL — ABNORMAL LOW (ref 13.0–17.0)
Hemoglobin: 9.5 g/dL — ABNORMAL LOW (ref 13.0–17.0)

## 2020-11-26 LAB — PREPARE RBC (CROSSMATCH)

## 2020-11-26 LAB — BASIC METABOLIC PANEL
Anion gap: 10 (ref 5–15)
BUN: 13 mg/dL (ref 8–23)
CO2: 24 mmol/L (ref 22–32)
Calcium: 8.3 mg/dL — ABNORMAL LOW (ref 8.9–10.3)
Chloride: 104 mmol/L (ref 98–111)
Creatinine, Ser: 0.82 mg/dL (ref 0.61–1.24)
GFR, Estimated: >60 mL/min (ref >60)
Glucose, Bld: 87 mg/dL (ref 70–99)
Potassium: 3.8 mmol/L (ref 3.5–5.1)
Sodium: 138 mmol/L (ref 135–145)

## 2020-11-26 MED ORDER — MESALAMINE 1000 MG RE SUPP
1000.0000 mg | Freq: Every day | RECTAL | Status: DC
  Administered 2020-11-26 – 2020-12-08 (×12): 1000 mg via RECTAL
  Filled 2020-11-26 (×15): qty 1

## 2020-11-26 MED ORDER — LACTATED RINGERS IV BOLUS
1000.0000 mL | Freq: Once | INTRAVENOUS | Status: AC
  Administered 2020-11-26: 1000 mL via INTRAVENOUS

## 2020-11-26 MED ORDER — HYDROCORTISONE ACETATE 25 MG RE SUPP
25.0000 mg | Freq: Every day | RECTAL | Status: DC
  Administered 2020-11-28 – 2020-12-09 (×12): 25 mg via RECTAL
  Filled 2020-11-26 (×15): qty 1

## 2020-11-26 MED ORDER — SODIUM CHLORIDE 0.9% IV SOLUTION: Freq: Once | INTRAVENOUS | Status: AC

## 2020-11-26 NOTE — Progress Notes (Signed)
Physical Therapy Treatment Patient Details Name: [name] MRN: [id number] DOB: 05/27/1956 Today's Date: 11/26/2020    History of Present Illness [name] is a 65 y.o. 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  AAROM to BLE's in sitting then max assist back to bed.  Pt can control trunk but as he fatigues his head droops forward.  Will continue to work on sitting balance control and head control, and focus on transition to standing as pt can tolerate.  Focus on acute PT goals.     Follow Up Recommendations  SNF     Equipment Recommendations  None recommended by PT    Recommendations for Other Services       Precautions / Restrictions Precautions Precautions: Fall Restrictions Weight Bearing Restrictions: No    Mobility  Bed Mobility Overal bed mobility: Needs Assistance Bed Mobility: Supine to Sit;Sit to Supine     Supine to sit: Max assist Sit to supine: Max assist   General bed mobility comments: able to sit on side of bed but head control weakens the fastest    Transfers Overall transfer level: Needs assistance   Transfers: Lateral/Scoot Transfers          Lateral/Scoot Transfers: Max assist General transfer comment: assisted to bed with help but cannot scoot well  Ambulation/Gait                 Stairs             Wheelchair Mobility    Modified Rankin (Stroke Patients Only)       Balance   Sitting-balance support: Feet supported;Bilateral upper extremity supported Sitting balance-Leahy Scale: Fair                                      Cognition Arousal/Alertness: Awake/alert Behavior During Therapy: Flat affect Overall Cognitive Status: No family/caregiver  present to determine baseline cognitive functioning Area of Impairment: Following commands;Problem solving                       Following Commands: Follows one step commands inconsistently;Follows one step commands with increased time     Problem Solving: Slow processing;Requires verbal cues;Requires tactile cues General Comments: pt assisting sitting balance and has a better control of head posture until he fatigues on side of bed      Exercises General Exercises - Lower Extremity Ankle Circles/Pumps: AAROM;5 reps Long Arc Quad: AAROM;10 reps Hip ABduction/ADduction: AAROM;10 reps Hip Flexion/Marching: AAROM;10 reps    General Comments General comments (skin integrity, edema, etc.): Pt is assisted to get heels floating and to extend hips and knees to increase active ROM to legs and extend posture.      Pertinent Vitals/Pain Pain Assessment: No/denies pain    Home Living                      Prior Function            PT Goals (current goals can now be found in the care plan section) Acute Rehab PT Goals Patient Stated Goal: none stated Progress towards  PT goals: Progressing toward goals    Frequency    Min 3X/week      PT Plan Current plan remains appropriate    Co-evaluation              AM-PAC PT "6 Clicks" Mobility   Outcome Measure  Help needed turning from your back to your side while in a flat bed without using bedrails?: A Little Help needed moving from lying on your back to sitting on the side of a flat bed without using bedrails?: A Lot Help needed moving to and from a bed to a chair (including a wheelchair)?: A Lot Help needed standing up from a chair using your arms (e.g., wheelchair or bedside chair)?: Total Help needed to walk in hospital room?: Total Help needed climbing 3-5 steps with a railing? : Total 6 Click Score: 10    End of Session   Activity Tolerance: Patient limited by fatigue Patient left: in bed;with  call bell/phone within reach;with bed alarm set;Other (comment) Nurse Communication: Mobility status;Other (comment) PT Visit Diagnosis: Unsteadiness on feet (R26.81);Other abnormalities of gait and mobility (R26.89);Repeated falls (R29.6);Muscle weakness (generalized) (M62.81);History of falling (Z91.81)     Time: 0981-1914 PT Time Calculation (min) (ACUTE ONLY): 32 min  Charges:  $Therapeutic Exercise: 8-22 mins $Therapeutic Activity: 8-22 mins                 Ramond Dial 11/26/2020, 9:42 PM Mee Hives, PT MS Acute Rehab Dept. Number: [place] [facility] [place]

## 2020-11-26 NOTE — Consult Note (Signed)
NAME:  [name], MRN:  [id number], DOB:  09-May-1956, LOS: 15 ADMISSION DATE:  11/16/2020, CONSULTATION DATE:  11/26/20 REFERRING MD:  Graciella Freer, CHIEF COMPLAINT:  Hypotensive   History of Present Illness:  65 yo M PMH anal squamous cell carcinoma, HIV, HCV, HTN, HLD, polysubstance abuse who presented to ED 4/13 with AMS. Found to have scattered acute/subacute infarcts for which neurology was consulted. ID consulted 4/13 as well to evaluate for possible infectious causes of encephalopathy, without infectious etiology. Continuing to follow for bullous lesions, HCV, HIV. During this hospitalization it was found that the pt has new HFrEF (45%).   Developed GIB 4/21. CCS csonulted and rec GI consult --saw pt 4/22. Later 4/22 pt with continued bleeding and new hypotension.   PCCM consulted in this setting  Pertinent  Medical History  Anal squamous cell carcinoma  HIV, AIDS HCV HTN HLD Tobacco use    Significant Hospital Events: Including procedures, antibiotic start and stop dates in addition to other pertinent events   4/13 admitted to [facility]MT[facility] for AMS  4/14 cards consult for TEE  4/21- [facility] consult for GIB, rec GI 4/22 GI consulted. Later hypotensive, hgb stable. PCCM consulted   Interim History / Subjective:  Getting PRBC SBPs 70-100   Objective   Blood pressure 97/69, pulse (!) 110, temperature 99.8 F (37.7 C), temperature source Axillary, resp. rate (!) 31, height 5\' 7"  (1.702 m), weight 58.9 kg, SpO2 97 %.        Intake/Output Summary (Last 24 hours) at 11/26/2020 2336 Last data filed at 11/26/2020 1918 Gross per 24 hour  Intake 240 ml  Output 2500 ml  Net -2260 ml   Filed Weights   11/24/20 0500 11/25/20 0341 11/26/20 0500  Weight: 59 kg 57.2 kg 58.9 kg    Examination: General: Chronically and acutely ill appearing thin frail M, appears older than stated age.  HENT: [place]. Pink mm. Trachea midline Lungs: Symmetrical chest expansion, even, unlabored on RA.   Cardiovascular: tachycardic, regular rhythm s1s2 cap refill < 3 sec Abdomen: Mildly distended, soft, non-tender Extremities: No acute joint deformity, no cyanosis or clubbing.  Symmetrically decreased bulk  Neuro: Awake, lethargic. Moving arms spontaneously, does not follow commands GU: Yellow urine   Labs/imaging that I havepersonally reviewed  (right click and "Reselect all SmartList Selections" daily)  CBC -- hgb 12.3 -- 11.2 plt 565 BMP WNL except Ca 8.3  CD4 337, CD4% 24 CD8tox 48   Resolved Hospital Problem list     Assessment & Plan:   Acute encephalopathy in setting of stroke Stroke- multifocal bilateral infarcts P -will hold ASA/plavix in setting of bleeding  -follow neuro exam  Hemorrhagic Shock -Bleeding from rectum -- possibly r/t radiation from anal squamous cell carcinoma: radiation proctitis  P -transfer to ICU -repeat H/H pending -CTA pelvis  -receiving PRBC-- continue to trend  -GI following, appreciate recs (no scope for now, BID suppository, transfuse as needed, pressure)  -hold ASA/plavix, dc antihypertensives   HIV HCV Bullous skin lesions, aphthous ulcers  P -per ID - switching to biktarvy   Hx Anal squamous cell carcinoma s/p excision, chemo, radiation Fecal incontinence - post excision  P -supportive care   Best practice (right click and "Reselect all SmartList Selections" daily)  Diet:  NPO Pain/Anxiety/Delirium protocol (if indicated): No VAP protocol (if indicated): Not indicated DVT prophylaxis: SCD GI prophylaxis: PPI Glucose control:  SSI No Central venous access:  N/A Arterial line:  N/A Foley:  N/A Mobility:  bed  rest  PT consulted: Yes Last date of multidisciplinary goals of care discussion [pending] Code Status:  full code Disposition: ICU  Labs   CBC: Recent Labs  Lab 11/22/20 0126 11/23/20 0030 11/24/20 0236 11/25/20 0025 11/26/20 0345 11/26/20 2040  WBC 11.2* 11.6* 11.4* 10.6* 11.4*  --   NEUTROABS 8.5*  9.7* 8.7* 7.6 8.2*  --   HGB 12.9* 12.7* 11.6* 12.4* 12.3* 11.2*  HCT 41.0 40.5 35.8* 38.7* 38.2* 34.8*  MCV 95.3 92.3 91.3 91.5 90.1  --   PLT 314 376 380 437* 565*  --     Basic Metabolic Panel: Recent Labs  Lab 11/20/20 0454 11/20/20 1800 11/21/20 0332 11/22/20 0126 11/23/20 0030 11/24/20 0236 11/25/20 0025 11/26/20 0345  NA 146*  --    < > 144 142 137 135 138  K 3.3*  --    < > 3.3* 3.6 3.6 3.9 3.8  CL 115*  --    < > 113* 113* 108 106 104  CO2 22  --    < > 22 24 21* 22 24  GLUCOSE 88  --    < > 105* 91 78 84 87  BUN 18  --    < > 17 13 13 12 13   CREATININE 0.98  --    < > 1.02 0.93 0.84 0.83 0.82  CALCIUM 8.2*  --    < > 8.7* 8.4* 7.8* 8.0* 8.3*  MG 2.0 2.0  --   --   --   --   --   --   PHOS 2.8 3.0  --   --   --   --   --   --    < > = values in this interval not displayed.   GFR: Estimated Creatinine Clearance: 73.8 mL/min (by C-G formula based on SCr of 0.82 mg/dL). Recent Labs  Lab 11/23/20 0030 11/24/20 0236 11/25/20 0025 11/26/20 0345  WBC 11.6* 11.4* 10.6* 11.4*    Liver Function Tests: No results for input(s): AST, ALT, ALKPHOS, BILITOT, PROT, ALBUMIN in the last 168 hours. No results for input(s): LIPASE, AMYLASE in the last 168 hours. No results for input(s): AMMONIA in the last 168 hours.  ABG    Component Value Date/Time   PHART 7.450 10/20/2008 0112   PCO2ART 27.5 (L) 10/20/2008 0112   PO2ART 85.0 10/20/2008 0112   HCO3 19.1 (L) 10/20/2008 0112   TCO2 24 05/07/2018 0232   ACIDBASEDEF 4.0 (H) 10/20/2008 0112   O2SAT 97.0 10/20/2008 0112     Coagulation Profile: Recent Labs  Lab 11/20/20 0454 11/21/20 0332  INR 1.0 1.0    Cardiac Enzymes: Recent Labs  Lab 11/20/20 0454 11/21/20 0332 11/22/20 0126  CKTOTAL 1,553* 837* 556*    HbA1C: Hgb A1c MFr Bld  Date/Time Value Ref Range Status  11/18/2020 01:12 AM 5.5 4.8 - 5.6 % Final    Comment:    (NOTE) Pre diabetes:          5.7%-6.4%  Diabetes:               >6.4%  Glycemic control for   <7.0% adults with diabetes     CBG: Recent Labs  Lab 11/22/20 0824 11/23/20 0828 11/23/20 1708 11/24/20 0803 11/25/20 0758  GLUCAP 96 65* 205* 94 81    Review of Systems:   Unable to obtain, due to encephalopathy   Past Medical History:  He,  has a past medical history of Acute kidney failure, AIDS ([place]) (06/07/2016), Alcohol  abuse (05/03/2016), anal ca (dx'd 02/2011), Cachexia ([place]), 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   Surgical History:   Past Surgical History:  Procedure Laterality Date  . BUBBLE STUDY  11/19/2020   Procedure: BUBBLE STUDY;  Surgeon: [name], MD;  Location: [facility];  Service: Cardiology;;  . MASS EXCISION  01/26/2011   squamous cell carcinoma  . TEE WITHOUT CARDIOVERSION N/A 11/19/2020   Procedure: TRANSESOPHAGEAL ECHOCARDIOGRAM (TEE);  Surgeon: [name], MD;  Location: [facility];  Service: Cardiology;  Laterality: N/A;     Social History:   reports that he has been smoking. He has never used smokeless tobacco. He reports current alcohol use of about 6.0 standard drinks of alcohol per week. He reports previous drug use.   Family History:  His Family history is unknown by patient.   Allergies No Known Allergies   Home Medications  Prior to Admission medications   Medication Sig Start Date End Date Taking? Authorizing Provider  bictegravir-emtricitabine-tenofovir AF (BIKTARVY) 50-200-25 MG TABS tablet Take 1 tablet by mouth  daily. 11/25/20   Velna Ochs, MD  clotrimazole (LOTRIMIN) 1 % cream Apply to affected area 2 times daily 10/06/19   Faustino Congress, NP  Darunavir-Cobicisctat-Emtricitabine-Tenofovir Alafenamide [facility]) 800-150-200-10 MG TABS Take 1 tablet by mouth daily with breakfast. 04/21/20   [name], [name], MD  HYDROcodone-acetaminophen (NORCO/VICODIN) 5-325 MG tablet TAKE 1 TABLET BY MOUTH EVERY 8 HOURS AS NEEDED FOR UP TO 5 DAYS FOR SEVERE PAIN NOT IMPROVED BY YOUR SCHEDULED ACETAMINOPHEN -NO MORE THAN 4000MG  PER DAY 08/25/20 02/21/21  Cardama, Grayce Sessions, MD  mupirocin ointment (BACTROBAN) 2 % APPLY TOPICALLY 2 (TWO) TIMES DAILY FOR 10 DAYS. APPLY TO HEAD OF PENIS 08/25/20 08/25/21  Cardama, Grayce Sessions, MD  omeprazole (PRILOSEC) 40 MG capsule Take 1 capsule (40 mg total) by mouth daily. 08/04/19   Golden Circle, FNP  oxyCODONE-acetaminophen (PERCOCET/ROXICET) 5-325 MG tablet Take 2 tablets by mouth every 6 (six) hours as needed for severe pain. 10/23/20   Noemi Chapel, MD  oxyCODONE-acetaminophen (PERCOCET/ROXICET) 5-325 MG tablet TAKE 2 TABLETS BY MOUTH EVERY 6 HOURS AS NEEDED FOR SEVERE PAIN. 10/23/20 04/21/21  Domenic Moras, PA-C     Critical care time: 48 min      CRITICAL CARE Performed by: Cristal Generous   Total critical care time: 48 minutes  Critical care time was exclusive of separately billable procedures and treating other patients.  Critical care was necessary to treat or prevent imminent or life-threatening deterioration.  Critical care was time spent personally by me on the following activities: development of treatment plan with patient and/or surrogate as well as nursing, discussions with consultants, evaluation of patient's response to treatment, examination of patient, obtaining history from patient or surrogate, ordering and performing treatments and interventions, ordering and review of laboratory studies, ordering and review of radiographic studies, pulse  oximetry and re-evaluation of patient's condition.  Eliseo Gum MSN, AGACNP-BC [place] [id number] If no answer, [id number] 11/27/2020, 12:27 AM

## 2020-11-26 NOTE — Progress Notes (Addendum)
Paged by nursing staff, patient with additional episode of bright red bloody bowel movement with clots. BP decreased to 97/96, sinus tach at 109. Patient evaluated at bedside, he is in no acute distress. When asked if he is in any pain, shakes his head no. No abdominal tenderness present.   Additional IV placed in right arm. IV fluid bolus started. Hgb resulted, 11.2 from 12.3. Type and screen as well as crossmatch ordered. While at bedside, MAP improved to 81. Patient also had two additional bright red bloody bowel movements with clots. Attempted to contact patient's brother, [place] Hijazi, but he did not answer x 2. Called patient's sister Deboorah Rideout. Updated her on patient's current satus. Discussed with her that he has had multiple bloody bowel movements and there is a possibility that we will have to give blood.   Patient currently altered and not able to consent for himself. Deboorah gave consent for blood products if needed. Will keep her updated of his status.   Addendum 2253 BP improved to 825'R systolically, MAP ranged from 70-80. Patient does appear to have bright red blood from rectum, slowly oozing. Will continue IV fluids and will repeat Hgb stat. Also giving canasa suppository at this time per GI recommendations.

## 2020-11-26 NOTE — Progress Notes (Addendum)
  Speech Language Pathology Treatment: Dysphagia  Patient Details Name: [name] MRN: [id number] DOB: 12-06-1955 Today's Date: 11/26/2020 Time: 3887-1959 SLP Time Calculation (min) (ACUTE ONLY): 14.33 min  Assessment / Plan / Recommendation Clinical Impression  Pt was seen for dysphagia treatment and was cooperative throughout the session. Pt and nursing reported that the pt has been tolerating the current diet without overt s/sx of aspiration. Pt was seen during breakfast and he consumed a meal of an omelette, breakfast potatoes, sausage, a magic cup, and thin liquids via cup. Pt's swallow function appeared within functional limits with adequate mastication, oral clearance, and no s/sx of aspiration. Pt was reminded of his increased aspiration risk with thin liquids via cup. He stated that he does not use them frequently and he verbalized agreement with avoiding them. It is recommended that the current dysphagia 3 diet with thin liquids via cup be continued. Further skilled SLP services are not clinically indicated at this time.    HPI HPI: [name] is a 65 y.o. 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 He was found to be encephalopathic at attended with acute renal failure and rhabdomyolysis.  He also has new bullous skin lesions on hands and legs. Pt was found to have multiple infarcts and anoxic changes on MRI 4/12.  CXR 4/12 with no acute findings. MBS 4/16: mild oropharyngeal dysphagia c/b prolonged mastication, premature spillage, delayed swallow initiation, and reduced base of tongue retraction.  These deficits resulted in trace, transient penetration of thin liquid by straw prior to the swallow.      SLP Plan  Discharge SLP treatment due to (comment);All goals met       Recommendations  Diet recommendations: Dysphagia 3 (mechanical  soft);Thin liquid Liquids provided via: Cup;No straw Medication Administration: Whole meds with puree Supervision: Staff to assist with self feeding Compensations: Slow rate;Small sips/bites;Minimize environmental distractions Postural Changes and/or Swallow Maneuvers: Seated upright 90 degrees                Oral Care Recommendations: Oral care BID;Staff/trained caregiver to provide oral care Follow up Recommendations:  (TBD) SLP Visit Diagnosis: Dysphagia, oropharyngeal phase (R13.12) Plan: Discharge SLP treatment due to (comment);All goals met       [name], [place], [place] Office number [phone] Pager [place] 11/26/2020, 10:08 AM

## 2020-11-26 NOTE — TOC Progression Note (Addendum)
Transition of Care [facility]) - Progression Note    Patient Details  Name: [name] MRN: [id number] Date of Birth: September 17, 1955  Transition of Care [facility]) CM/SW [place], LCSW Phone Number: 11/26/2020, 9:32 AM  Clinical Narrative:    9:32am-CSW still unable to reach Admissions at [facility] (per coworkers they have also not been getting a response). Will continue to call them.   1:55pm-CSW heard back from [place] from [place]. They are able to accept patient Monday if medically stable. He will need a COVID test on Sunday. CSW appreciates pharmacy's assistance in obtaining patient's Biktarvy prior to his discharge.   CSW updated patient's brother of DC plan. He reported he can go to [place] on Monday to sign admission paperwork. He spoke with his sister, [name], and she has agreed to be patient's contact since brother is often out of town for work. Her contact info added to [place].    Expected Discharge Plan: Skilled Nursing Facility Barriers to Discharge: Homeless with medical needs,Active Substance Use - Placement,SNF Pending bed offer  Expected Discharge Plan and Services Expected Discharge Plan: [place] In-house Referral: Clinical Social Work   Post Acute Care Choice: [place] Living arrangements for the past 2 months: No permanent address,Homeless                                       Social Determinants of Health (SDOH) Interventions    Readmission Risk Interventions No flowsheet data found.

## 2020-11-26 NOTE — Significant Event (Signed)
Rapid Response Event Note   Reason for Call :  Called originally at 2200 d/t pt having bloody stools and hypotension. At that time, MDs were at bedside and pt was receiving 1L LR bolus, hgb-11.2.  Pt had large bloody BM after recent hgb level had been drawn so another h/h ordered.   Pt seen my RRT at approximately 2300.   Initial Focused Assessment:  Pt lying in bed with eyes closed. Pt very hard to arouse but will open eyes to painful stimulation. Pupils 4, equal, and sluggish. Lungs clear and diminished. Skin cool to touch. Pt just passed large bloody clot from rectum.  T-99.8, HR-107, BP-67/42, RR-24, SpO2-97% on RA.  Pt receiving 2nd L LR. 3rd L LR hung at this time as well. BP increased to 90s and pt's MS increased back to baseline(nodding/shaking head appropriately to questions).   Interventions:  3rd L LR hung in addition to 2nd L LR. Hgb-9.5(down from 11.2 drawn 3 hours ago) 1st of 2 units PRBCs hung GI called by IMTS: recommended transfusing blood and CTA PCCM consulted and came to bedside: CTA abd/pelvis Tx to ICU  Plan of Care:  Tx to 2M12   Event Summary:   MD Notified: Dr. Johnney Ou and [facility] at bedside on my arrival. Shirlee Limerick, [place] NP and Dr. Verlee Monte PCCM consulted and came to bedside.  Call Tina End Time:0130  Dillard Essex, RN

## 2020-11-26 NOTE — Progress Notes (Signed)
Texted GI Dr. Charleen Kirks and notified that the patient just had a very large congealed (large bllod clots) BM.  Logged vitals.  Dr. Charleen Kirks responded and said she is on her way to see the patient.

## 2020-11-26 NOTE — Progress Notes (Incomplete)
MD notified w/patient's VS and another episode of bloody stool (blood clots).

## 2020-11-26 NOTE — Progress Notes (Signed)
Dr. Charleen Kirks, IMTS resident, paged concerning bloody bowel movement with blood (images below).  Dr. Lynder Parents went to bedside to image as per below.  Patient was asymptomatic, without abdominal pain.  Resting comfortably.  Vital signs continue to be stable.  Patient did have q8H hemoglobins ordered for GI, but it appears as though this was accidentally canceled.  Uncertain as to why.  Discussed with lab and reordered every 8 hour hemoglobins.  We will continue to monitor vital signs as well as if any symptoms occur.  Continue with GI orders of alternating hydrocortisone and Canasa suppository every 12 hours.  No indication for transfusion at this time.     clots.

## 2020-11-26 NOTE — Progress Notes (Addendum)
Patient continues to have bright red blood per rectum with bowel movements, patient responding well to IV fluids, but once fluids stopped, blood pressure drops. Discussed with GI who do not believe patient is candidate for procedure at this time as suspect bleeding is 2/2 radiation proctitis/anal cancer. Recommend continuing with transfusion and direct pressure as needed to stop bleeding as well as CT angiogram. Also recommended holding aspirin/plavix.  Patient has next dose of aspirin/plavix due tomorrow morning.   Despite IV fluid, patient started to have persistent hypotension with MAP under 65. Ordered additional bolus of IV fluids and released blood products. Patient to receive 2 units. Patient's MAP remained below 65. PCCM consulted, will see patient. Appreciate their assistance in [name]'s case.   Addendum 0038 PCCM at bedside. Will transfer patient to ICU for closer monitoring. Sister [name] updated.

## 2020-11-26 NOTE — Consult Note (Addendum)
Consultation  Referring Provider: [facility] Primary Care Physician:  [name], [name], MD Primary Gastroenterologist: Althia Forts        Reason for Consultation: History of colon cancer, now with rectal pain and bleeding            HPI:   [name] is a 65 y.o. male with a past medical history of HIV and AIDS, HCV, hypertension, hyperlipidemia, tobacco use and anal squamous cell carcinoma, who presented to the hospital originally on 11/16/2020 with altered mental status.    Patient found to have multiple acute/subacute ischemic infarcts and is now on aspirin/Plavix and redeveloped rectal bleeding.  We are asked to see in regards to this.    Today, patient is unable to give his history as he is nonverbal after recent stroke, but does nod his head yes and no to certain questions.  Per nursing staff yesterday when wiping him multiple times after bowel movements they did see a small amount of bright red blood on the tissue.  This morning though patient had a very large amount of bright red blood per rectum which "smelled terrible" and included clots.  He has not had another movement since then.  They do describe some skin breakdown around his sacrum, but this was obviously from his rectum.    No prior history of colonoscopy.    Denies abdominal pain.     GI history: 10/23/2020 MRI of the pelvis no acute findings, no evidence of rectal mass or pelvic metastatic disease, no evidence of perianal fistula or abscess 09/06/2020 seen by Dr. Joya Gaskins of [facility]: For anal pain, patient had MRI of the pelvis and was referred to our clinic for a colonoscopy as he had never had one before 01/30/2011 presented to the surgeons with a mass versus hemorrhoid and underwent excision found to have anal squamous cell carcinoma  Past Medical History:  Diagnosis Date  . Acute kidney failure    unspec, secondary to medications  . AIDS ([place]) 06/07/2016  . Alcohol abuse 05/03/2016  . anal ca dx'd 02/2011  . Cachexia  ([place])   . 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 Drug abuse ([place])   . GERD (gastroesophageal reflux disease) 08/09/2016  . Grieving 11/29/2017  . Hemorrhoid   . Hepatitis C   . History of radiation therapy 01/01/2011 -05/18/11   anal, pelvis, inguinal lymph nodes  . HIV (human immunodeficiency virus infection) ([place])   . HIV or AIDS   . Housing problems 10/22/2020  . Hypertension   . Loose stools 06/07/2016  . Mass of anus 01/26/2011   squamous cell cancer  . Meningitis   . Mood disorder ([place])   . Pain    right leg  . Pulmonary nodule   . Rash and nonspecific skin eruption 03/31/2015  . Rectal bleeding 08/09/2020  . Testicular hypofunction     Past Surgical History:  Procedure Laterality Date  . BUBBLE STUDY  11/19/2020   Procedure: BUBBLE STUDY;  Surgeon: [name], MD;  Location: [place];  Service: Cardiology;;  . MASS EXCISION  01/26/2011   squamous cell carcinoma  . TEE WITHOUT CARDIOVERSION N/A 11/19/2020   Procedure: TRANSESOPHAGEAL ECHOCARDIOGRAM (TEE);  Surgeon: [name], MD;  Location: [place];  Service: Cardiology;  Laterality: N/A;    Family History  Family history unknown: Yes     Social History   Tobacco Use  . Smoking status: Current Every Day Smoker  . Smokeless tobacco: Never Used  .  Tobacco comment: 2-3 day  Vaping Use  . Vaping Use: Never used  Substance Use Topics  . Alcohol use: Yes    Alcohol/week: 6.0 standard drinks    Types: 6 Cans of beer per week  . Drug use: Not Currently    Prior to Admission medications   Medication Sig Start Date End Date Taking? Authorizing Provider  bictegravir-emtricitabine-tenofovir AF (BIKTARVY) 50-200-25 MG TABS tablet Take 1 tablet by mouth daily. 11/25/20   Velna Ochs, MD  clotrimazole (LOTRIMIN) 1 % cream Apply to affected area 2 times daily 10/06/19   Faustino Congress, NP  Darunavir-Cobicisctat-Emtricitabine-Tenofovir Alafenamide [facility]) 800-150-200-10 MG TABS Take 1 tablet by mouth daily  with breakfast. 04/21/20   [name], [name], MD  omeprazole (PRILOSEC) 40 MG capsule Take 1 capsule (40 mg total) by mouth daily. 08/04/19   Golden Circle, FNP  oxyCODONE-acetaminophen (PERCOCET/ROXICET) 5-325 MG tablet Take 2 tablets by mouth every 6 (six) hours as needed for severe pain. 10/23/20   Noemi Chapel, MD    Current Facility-Administered Medications  Medication Dose Route Frequency Provider Last Rate Last Admin  . acetaminophen (TYLENOL) tablet 650 mg  650 mg Oral Q6H PRN Velna Ochs, MD       Or  . acetaminophen (TYLENOL) suppository 650 mg  650 mg Rectal Q6H PRN Velna Ochs, MD      . aspirin chewable tablet 81 mg  81 mg Oral Daily Velna Ochs, MD   81 mg at 11/26/20 0839  . atorvastatin (LIPITOR) tablet 40 mg  40 mg Oral Daily Bailey-Modzik, Delila A, NP   40 mg at 11/26/20 0838  . bictegravir-emtricitabine-tenofovir AF (BIKTARVY) 50-200-25 MG per tablet 1 tablet  1 tablet Oral Daily [name], [name], MD   1 tablet at 11/26/20 [phone]  . carvedilol (COREG) tablet 3.125 mg  3.125 mg Per Tube BID WC Pierce, Dwayne A, RPH   3.125 mg at 11/26/20 0839  . Chlorhexidine Gluconate Cloth 2 % PADS 6 each  6 each Topical Daily Velna Ochs, MD   6 each at 11/26/20 [phone]  . clopidogrel (PLAVIX) tablet 75 mg  75 mg Oral Daily Bailey-Modzik, Delila A, NP   75 mg at 11/26/20 0842  . enoxaparin (LOVENOX) injection 40 mg  40 mg Subcutaneous Q24H Masoudi, Elhamalsadat, MD   40 mg at 11/25/20 2023  . feeding supplement (ENSURE ENLIVE / ENSURE PLUS) liquid 237 mL  237 mL Oral TID BM Velna Ochs, MD   237 mL at 11/26/20 1131  . losartan (COZAAR) tablet 25 mg  25 mg Oral Daily Velna Ochs, MD   25 mg at 11/26/20 0839  . multivitamin with minerals tablet 1 tablet  1 tablet Oral Daily Velna Ochs, MD   1 tablet at 11/26/20 [phone]  . sodium chloride flush (NS) 0.9 % injection 3 mL  3 mL Intravenous Q12H Jose Persia, MD   3 mL at 11/26/20 0854     Allergies as of 11/16/2020  . (No Known Allergies)     Review of Systems:    Unable to complete given recent stroke   Physical Exam:  Vital signs in last 24 hours: Temp:  [98.7 F (37.1 C)-99 F (37.2 C)] 98.7 F (37.1 C) (04/22 0500) Pulse Rate:  [79-88] 79 (04/22 0500) Resp:  [16-20] 18 (04/22 0500) BP: (105-144)/(60-75) 105/60 (04/22 0500) SpO2:  [97 %-100 %] 99 % (04/22 0500) Weight:  [58.9 kg] 58.9 kg (04/22 0500) Last BM Date: 11/26/20 General:   Pleasant chronically ill-appearing  AA male appears to be in NAD, Well developed, Well nourished, alert and cooperative Head:  Normocephalic and atraumatic. Eyes:   PEERL, EOMI. No icterus. Conjunctiva pink. Ears:  Normal auditory acuity. Neck:  Supple Throat: Oral cavity and pharynx without inflammation, swelling or lesion.  Lungs: Respirations even and unlabored. Lungs clear to auscultation bilaterally.   No wheezes, crackles, or rhonchi.  Heart: Normal S1, S2. No MRG. Regular rate and rhythm. No peripheral edema, cyanosis or pallor.  Abdomen:  Soft, nondistended, nontender. No rebound or guarding. Normal bowel sounds. No appreciable masses or hepatomegaly. Rectal:  Not performed.  (Performed by surgical team chronic thickening/scarring of the perianal tissue, poor sphincter tone, no palpable mass on rectal exam) Msk:  Symmetrical without gross deformities. Peripheral pulses intact.  Extremities:  Without edema, no deformity or joint abnormality. Neurologic:  Alert ;  Follows one step commands Skin:   Scattered bullous lesions as noted in IDs note, mostly covered with clean dressings Psychiatric: Pleasant, Calm  LAB RESULTS: Recent Labs    11/24/20 0236 11/25/20 0025 11/26/20 0345  WBC 11.4* 10.6* 11.4*  HGB 11.6* 12.4* 12.3*  HCT 35.8* 38.7* 38.2*  PLT 380 437* 565*   BMET Recent Labs    11/24/20 0236 11/25/20 0025 11/26/20 0345  NA 137 135 138  K 3.6 3.9 3.8  CL 108 106 104  CO2 21* 22 24  GLUCOSE 78 84  87  BUN 13 12 13   CREATININE 0.84 0.83 0.82  CALCIUM 7.8* 8.0* 8.3*    Impression / Plan:   Impression: 1.  Rectal bleeding and rectal pain: Previous anal cancer as below, evaluated by [facility] initially with an MRI with no acute cause found on 10/23/2020, they recommended a colonoscopy 2.  History of anal squamous cell carcinoma: Status post excision 2012 followed by chemo/radiation 3.  Fecal incontinence: Patient has no sphincter tone likely status post excision of rectal cancer and chemo/radiation  Plan: 1.  We will recheck hemoglobin today and continue to trend every 8 hours, pending these results we are not planning on any emergent GI procedures unless illustrated by increased bleeding or significant drop in hgb, do not think patient would be able to successfully prep, would need consent from brother. 2.  Possible cause of rectal bleeding could be from radiation proctitis given history.  We will start Canasa suppositories at bedtime and Hydrocortisone suppositories in the morning to see if this is helpful. 3.  Please await any further recommendations from Dr. Silverio Decamp later today.  Thank you for your kind consultation, we will continue to follow.  Lavone Nian [facility]  11/26/2020, 11:56 AM   Attending physician's note   I have taken a history, examined the patient and reviewed the chart. I agree with the Advanced Practitioner's note, impression and recommendations.  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  Denzil Magnuson , MD [phone]

## 2020-11-26 NOTE — Progress Notes (Signed)
Subjective:  Mr. Bodi denies any acute complaints at this time. He ate his breakfast.  Attempted to contact brother but unable to reach today.  Objective: Vital signs in last 24 hours: Vitals:   11/25/20 2322 11/26/20 0500  BP: 128/72 105/60  Pulse: 82 79  Resp: 20 18  Temp: 98.8 F (37.1 C) 98.7 F (37.1 C)  SpO2: 98% 99%   Chronically ill-appearing elderly male sitting in bed, NAD. Cardiovascular: Regular rate and normal rhythm, no murmurs rubs or gallops. Skin: Bullae unchanged from prior examination, no new lesions identified.  Lesions on knees wrapped in dressings. Neuro: Alert to self and more interactive during encounter.  Follows simple commands, symmetrical strength bilaterally.   Intake/Output Summary (Last 24 hours) at 11/26/2020 0644 Last data filed at 11/26/2020 0530 Gross per 24 hour  Intake 240 ml  Output 1050 ml  Net -810 ml   Filed Weights   11/24/20 0500 11/25/20 0341 11/26/20 0500  Weight: 59 kg 57.2 kg 58.9 kg   Labs in last 24 hours: CBC Latest Ref Rng & Units 11/26/2020 11/25/2020 11/24/2020  WBC 4.0 - 10.5 K/uL 11.4(H) 10.6(H) 11.4(H)  Hemoglobin 13.0 - 17.0 g/dL 12.3(L) 12.4(L) 11.6(L)  Hematocrit 39.0 - 52.0 % 38.2(L) 38.7(L) 35.8(L)  Platelets 150 - 400 K/uL 565(H) 437(H) 380    CMP Latest Ref Rng & Units 11/26/2020 11/25/2020 11/24/2020  Glucose 70 - 99 mg/dL 87 84 78  BUN 8 - 23 mg/dL 13 12 13   Creatinine 0.61 - 1.24 mg/dL 0.82 0.83 0.84  Sodium 135 - 145 mmol/L 138 135 137  Potassium 3.5 - 5.1 mmol/L 3.8 3.9 3.6  Chloride 98 - 111 mmol/L 104 106 108  CO2 22 - 32 mmol/L 24 22 21(L)  Calcium 8.9 - 10.3 mg/dL 8.3(L) 8.0(L) 7.8(L)  Total Protein 6.5 - 8.1 g/dL - - -  Total Bilirubin 0.3 - 1.2 mg/dL - - -  Alkaline Phos 38 - 126 U/L - - -  AST 15 - 41 U/L - - -  ALT 0 - 44 U/L - - -    Assessment/Plan:  Active Problems:   Acute encephalopathy   Pressure injury of skin   Acute renal failure (HCC)   Traumatic rhabdomyolysis (HCC)    Bullous skin disease   Multiple cerebral infarctions (HCC)   Rectal cancer (HCC)   Acute combined systolic and diastolic heart failure (HCC)   Hyperkalemia   Tongue lesion  Tung Pustejovsky is a 65 year old man with a history of AIDS and substance use admitted with acute encephalopathy found to have numerous new acute/subacute ischemic infarcts on MRI Brain and severe rhabdomyolysis.  #Acute encephalopathy #Multiple acute/subacute ischemic infarcts #Hypoxic ischemic injury Patient's encephalopathy still gradually improving, but he still does not have decision-making capacity.  Unsure what his new baseline mental status will be.  Unable to contact brother today.  Spoke with TOC, patient will have SNF bed availability at [facility] on Monday should he be medically cleared by then but will need brother to sign him into facility.  TOC will attempt to contact brother to see if this will be possible. - Continue DAPT x3 weeks then aspirin alone - Continue statin - Follow-up in stroke clinic with Dr. Leonie Man at [facility] in about 4 weeks - PT OT recommending SNF placement but will need consent - Advanced to dysphagia 3 diet, tolerating well  #Bullous Lesions Patient's bullous lesions unchanged and no new lesions seen.  Likely would not benefit from punch  biopsy as all of his lesions are either old or in very sensitive areas.  #History of rectal cancer Patient with history of rectal cancer.  General surgery recommended consulting GI for possible colonoscopy.  Consulted GI, greatly appreciate recommendations.  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 Cardiology managing, appreciate assistance.  #Rhabdomyolysis - resolved  #Hypernatremia, resolved  #AIDS -Continue Biktarvy -Follow-up with ID as outpatient on 12/03/2020 at 9 AM with Dr. [name]   LOS: 10 days   [name],  MD 11/26/2020, 6:44 AM

## 2020-11-27 ENCOUNTER — Inpatient Hospital Stay (HOSPITAL_COMMUNITY): Payer: Medicare Other

## 2020-11-27 ENCOUNTER — Encounter (HOSPITAL_COMMUNITY): Payer: Self-pay | Admitting: Internal Medicine

## 2020-11-27 ENCOUNTER — Encounter (HOSPITAL_COMMUNITY)
Admission: EM | Disposition: A | Discharge status: Skilled Nursing Facility | Payer: Self-pay | Source: Home / Self Care | Attending: Internal Medicine

## 2020-11-27 DIAGNOSIS — K626 Ulcer of anus and rectum: Secondary | ICD-10-CM

## 2020-11-27 DIAGNOSIS — G931 Anoxic brain damage, not elsewhere classified: Secondary | ICD-10-CM

## 2020-11-27 DIAGNOSIS — R578 Other shock: Secondary | ICD-10-CM

## 2020-11-27 DIAGNOSIS — K625 Hemorrhage of anus and rectum: Secondary | ICD-10-CM | POA: Diagnosis not present

## 2020-11-27 HISTORY — PX: IR US GUIDE VASC ACCESS RIGHT: IMG2390

## 2020-11-27 HISTORY — PX: IR ANGIOGRAM VISCERAL SELECTIVE: IMG657

## 2020-11-27 HISTORY — PX: IR ANGIOGRAM PELVIS SELECTIVE OR SUPRASELECTIVE: IMG661

## 2020-11-27 HISTORY — PX: BIOPSY: SHX5522

## 2020-11-27 HISTORY — PX: FLEXIBLE SIGMOIDOSCOPY: SHX5431

## 2020-11-27 LAB — CBC WITH DIFFERENTIAL/PLATELET
Abs Immature Granulocytes: 0.5 10*3/uL — ABNORMAL HIGH (ref 0.00–0.07)
Basophils Absolute: 0 10*3/uL (ref 0.0–0.1)
Basophils Relative: 0 %
Eosinophils Absolute: 0 10*3/uL (ref 0.0–0.5)
Eosinophils Relative: 0 %
HCT: 30.3 % — ABNORMAL LOW (ref 39.0–52.0)
Hemoglobin: 10 g/dL — ABNORMAL LOW (ref 13.0–17.0)
Lymphocytes Relative: 8 %
Lymphs Abs: 2.1 10*3/uL (ref 0.7–4.0)
MCH: 28.6 pg (ref 26.0–34.0)
MCHC: 33 g/dL (ref 30.0–36.0)
MCV: 86.6 fL (ref 80.0–100.0)
Metamyelocytes Relative: 2 %
Monocytes Absolute: 1.9 10*3/uL — ABNORMAL HIGH (ref 0.1–1.0)
Monocytes Relative: 7 %
Neutro Abs: 22.2 10*3/uL — ABNORMAL HIGH (ref 1.7–7.7)
Neutrophils Relative %: 83 %
Platelets: 441 10*3/uL — ABNORMAL HIGH (ref 150–400)
RBC: 3.5 MIL/uL — ABNORMAL LOW (ref 4.22–5.81)
RDW: 16.8 % — ABNORMAL HIGH (ref 11.5–15.5)
WBC: 26.8 10*3/uL — ABNORMAL HIGH (ref 4.0–10.5)
nRBC: 0 % (ref 0.0–0.2)
nRBC: 0 /100 WBC

## 2020-11-27 LAB — GLUCOSE, CAPILLARY
Glucose-Capillary: 104 mg/dL — ABNORMAL HIGH (ref 70–99)
Glucose-Capillary: 107 mg/dL — ABNORMAL HIGH (ref 70–99)
Glucose-Capillary: 107 mg/dL — ABNORMAL HIGH (ref 70–99)
Glucose-Capillary: 115 mg/dL — ABNORMAL HIGH (ref 70–99)
Glucose-Capillary: 91 mg/dL (ref 70–99)
Glucose-Capillary: 98 mg/dL (ref 70–99)
Glucose-Capillary: 98 mg/dL (ref 70–99)

## 2020-11-27 LAB — BASIC METABOLIC PANEL
Anion gap: 9 (ref 5–15)
BUN: 22 mg/dL (ref 8–23)
CO2: 20 mmol/L — ABNORMAL LOW (ref 22–32)
Calcium: 7.7 mg/dL — ABNORMAL LOW (ref 8.9–10.3)
Chloride: 105 mmol/L (ref 98–111)
Creatinine, Ser: 1.12 mg/dL (ref 0.61–1.24)
GFR, Estimated: >60 mL/min (ref >60)
Glucose, Bld: 98 mg/dL (ref 70–99)
Potassium: 4.7 mmol/L (ref 3.5–5.1)
Sodium: 134 mmol/L — ABNORMAL LOW (ref 135–145)

## 2020-11-27 LAB — TYPE AND SCREEN
ABO/RH(D): O NEG
Antibody Screen: NEGATIVE

## 2020-11-27 LAB — DIC (DISSEMINATED INTRAVASCULAR COAGULATION)PANEL
D-Dimer, Quant: 3.55 ug/mL-FEU — ABNORMAL HIGH (ref 0.00–0.50)
Fibrinogen: 635 mg/dL — ABNORMAL HIGH (ref 210–475)
INR: 1.1 (ref 0.8–1.2)
Platelets: 413 10*3/uL — ABNORMAL HIGH (ref 150–400)
Prothrombin Time: 14.7 seconds (ref 11.4–15.2)
Smear Review: NONE SEEN
aPTT: 28 seconds (ref 24–36)

## 2020-11-27 LAB — HEMOGLOBIN AND HEMATOCRIT, BLOOD
HCT: 26.5 % — ABNORMAL LOW (ref 39.0–52.0)
HCT: 26.9 % — ABNORMAL LOW (ref 39.0–52.0)
HCT: 28 % — ABNORMAL LOW (ref 39.0–52.0)
Hemoglobin: 8.8 g/dL — ABNORMAL LOW (ref 13.0–17.0)
Hemoglobin: 9.3 g/dL — ABNORMAL LOW (ref 13.0–17.0)
Hemoglobin: 9.4 g/dL — ABNORMAL LOW (ref 13.0–17.0)

## 2020-11-27 LAB — PREPARE RBC (CROSSMATCH)

## 2020-11-27 LAB — MAGNESIUM: Magnesium: 1.7 mg/dL (ref 1.7–2.4)

## 2020-11-27 IMAGING — XA IR ANGIO/VISCERAL SELECTIVE EA VESSEL WO/W FLUSH
10 series · 13 of 24 positions shown · IV contrast (IODINE)
Comparison: none

INDICATION: 65-year-old gentleman with acute lower GI bleed and hypotension
requiring pressors presents today measure radiology for angiogram
and possible embolization. CT angiography demonstrates acute
hemorrhage in the rectum.

[Series 1: ir angio/visceral selective ea vessel wo/w flush · 1 of 1 slices shown]
[im 1/1]
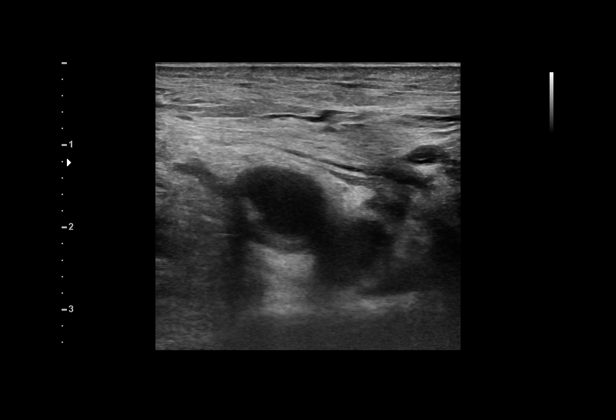

[Series 2: dsa overview · 2 acquisitions, 1 frame shown (1 of 9)]
[im 1/2]
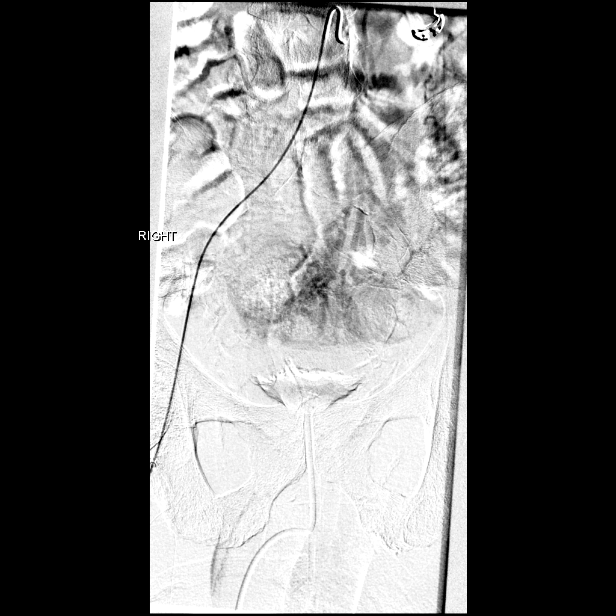

[Series 3: dsa overview · 2 acquisitions, 1 frame shown (2 of 9)]
[im 1/2]
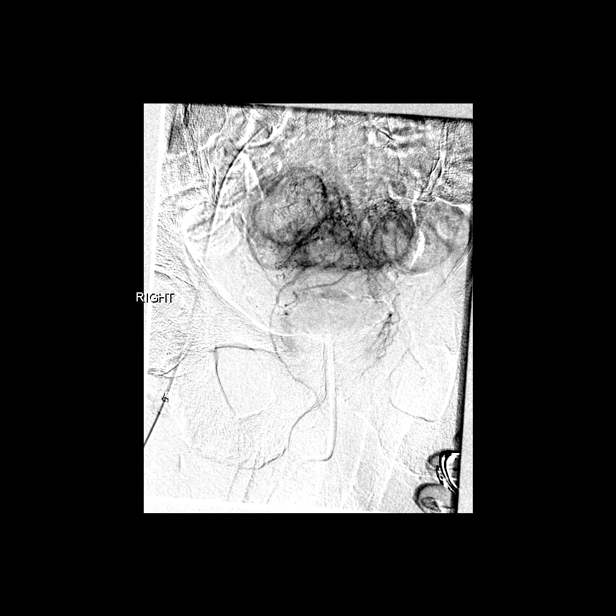

[Series 4: dsa overview · 2 acquisitions, 1 frame shown (3 of 9)]
[im 1/2]
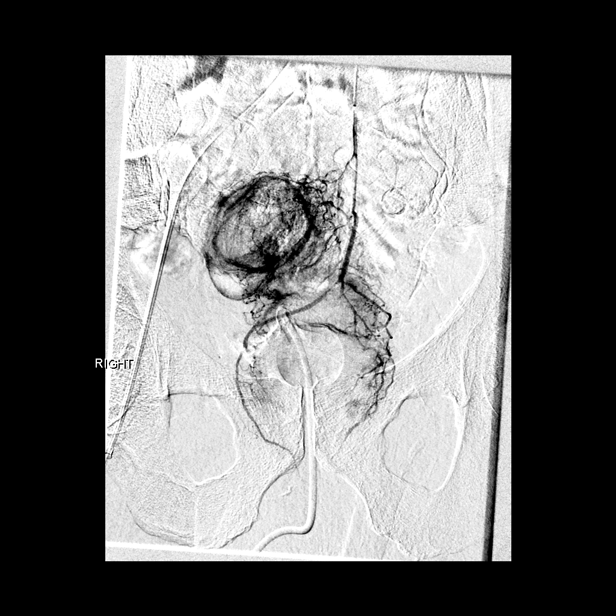

[Series 5: dsa overview · 2 acquisitions, 2 frames shown (4 of 9)]
[im 1/2]
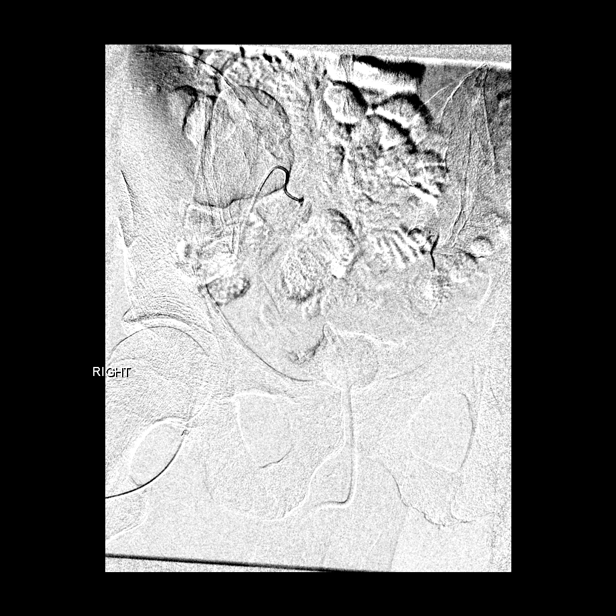
[im 2/2]
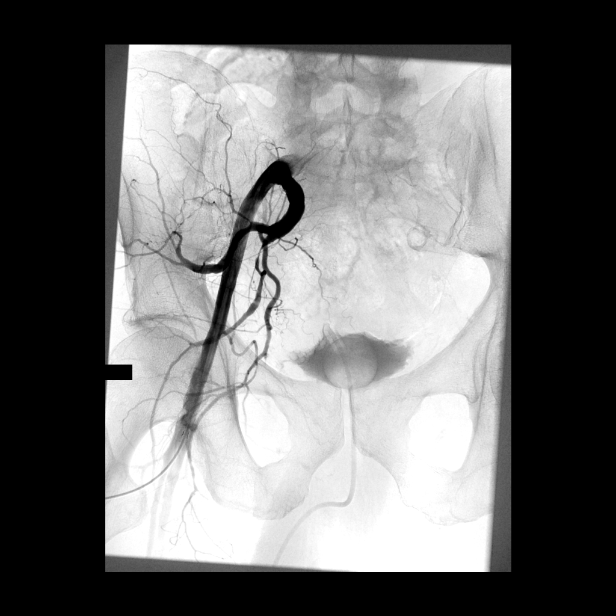

[Series 6: dsa overview · 2 acquisitions, 1 frame shown (5 of 9)]
[im 1/2]
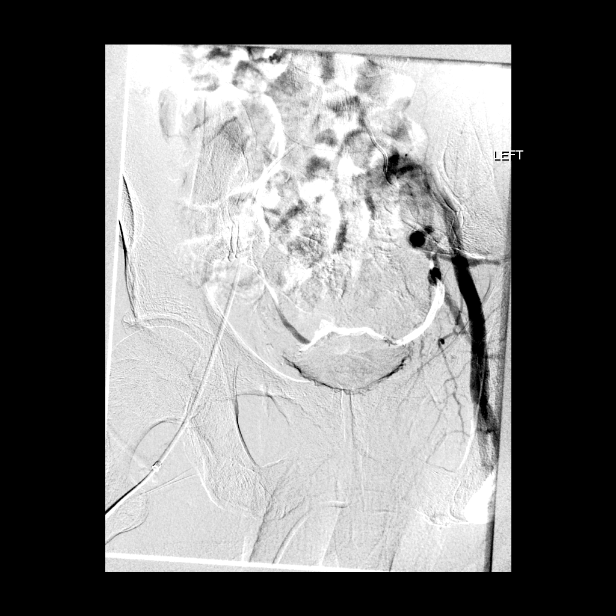

[Series 7: dsa overview · 2 acquisitions, 2 frames shown (6 of 9)]
[im 1/2]
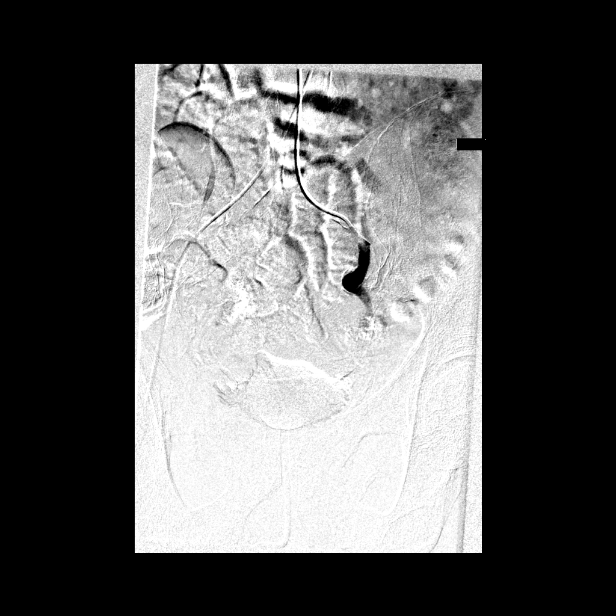
[im 1/2]
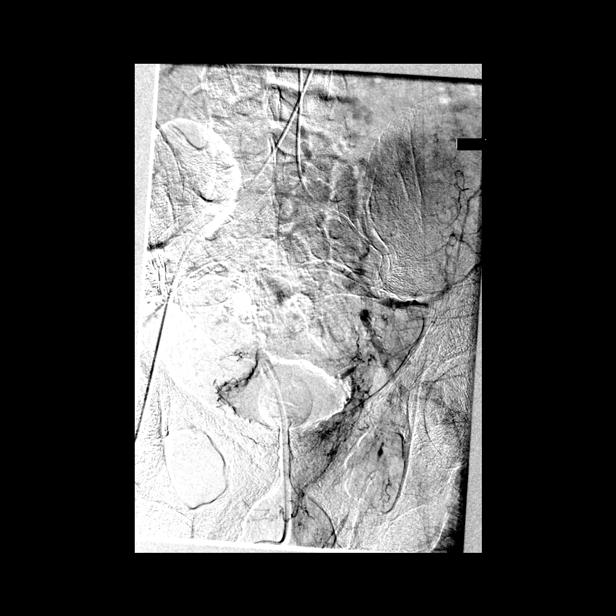

[Series 8: dsa overview · 1 of 26 frames shown (7 of 9)]
[frame 16/26]
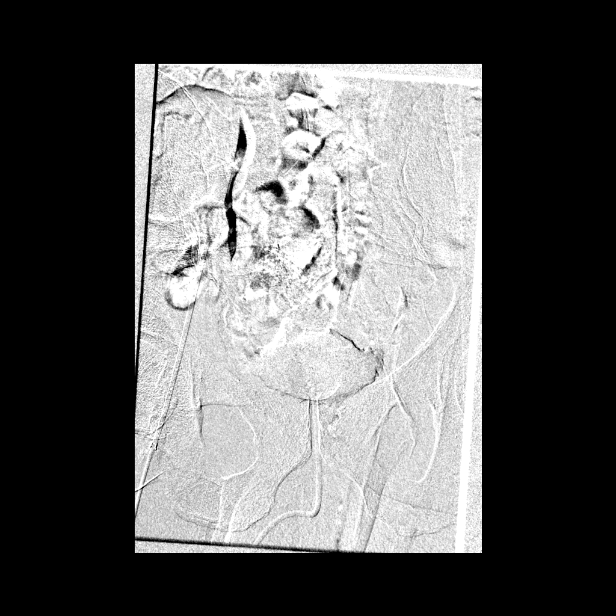

[Series 9: dsa overview · 2 acquisitions, 1 frame shown (8 of 9)]
[im 1/2]
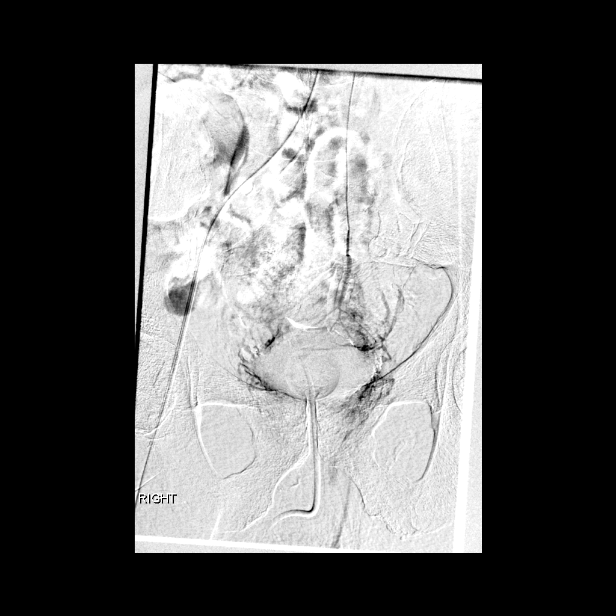

[Series 10: dsa overview · 2 acquisitions, 2 frames shown (9 of 9)]
[im 1/2]
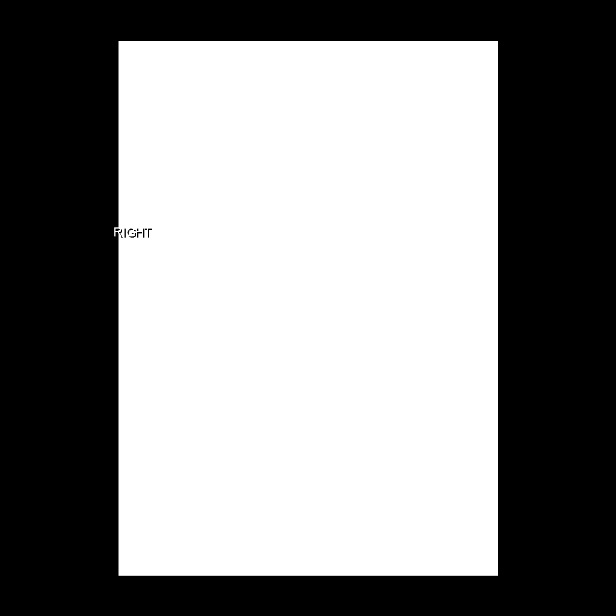
[im 2/2]
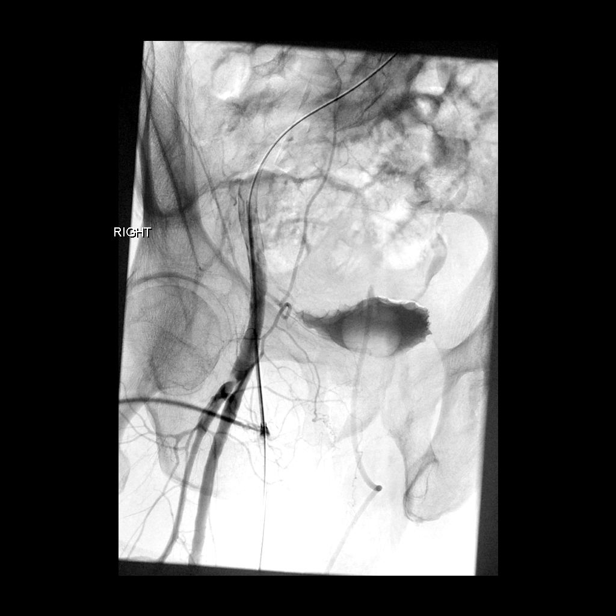

[13 of 24 positions shown; findings below may reference images not displayed]

EXAM:
1. Ultrasound-guided access of right common femoral artery
2. Bilateral internal iliac angiogram
3. Inferior mesenteric angiogram

MEDICATIONS:
Ancef 2 g IV. The antibiotic was administered within 1 hour of the
procedure

ANESTHESIA/SEDATION:
None

CONTRAST:  Under in 10 mL of Omnipaque 300

FLUOROSCOPY TIME:  Fluoroscopy Time: 17 minutes 24 seconds (or 327
mGy).

COMPLICATIONS:
None immediate.

PROCEDURE:
Informed consent was obtained from the patient's sister following
explanation of the procedure, risks, benefits and alternatives. The
patient understands, agrees and consents for the procedure. All
questions were addressed. A time out was performed prior to the
initiation of the procedure. Maximal barrier sterile technique
utilized including caps, mask, sterile gowns, sterile gloves, large
sterile drape, hand hygiene, and chlorhexidine prep.

Patient positioned supine on the procedure table. The right groin
was prepped and draped in the usual fashion. Sterile ultrasound
probe cover and sterile gel were utilized throughout the procedure.

Ultrasound image documenting patency of the right common femoral
artery was obtained and placed in permanent medical record.
Following local lidocaine administration, the right common femoral
artery was accessed with a 21 gauge needle utilizing continuous
ultrasound guidance. 21 gauge needle exchanged for a transitional
dilator [DATE] inch guidewire. Transitional dilator set
exchanged for 5 French sheath over 0.035 inch guidewire.

Sauce omni catheter utilized to select the inferior mesenteric
artery. Inferior mesenteric angiogram demonstrated no active
extravasation. Progreat microcatheter was advanced through the Sos
Omni more distally into the inferior mesenteric artery towards the
rectal branches and angiogram was repeated. No extravasation was
identified.

Sos omni catheter then utilized to select the right internal iliac
artery. Angiogram was performed which showed no active
extravasation.

Wall min loop formed in the abdominal aorta and C2 glide catheter
utilized to select the left internal iliac artery. Left internal
iliac artery angiogram demonstrated no active extravasation.

C2 glide catheter exchanged for a Sos Omni and inferior mesenteric
artery was again accessed. Progreat microcatheter advanced towards
the rectum and angiogram was repeated, however no extravasation was
identified.

Access removed and hemostasis achieved with 10 minutes of manual
compression.
IMPRESSION: Inferior mesenteric and bilateral internal iliac artery angiogram
demonstrates no active hemorrhage in the region of the rectum. No
embolization was performed. If the patient has recurrent hemorrhage,
recommend colonoscopy.

## 2020-11-27 IMAGING — CT CT CTA ABD/PEL W/CM AND/OR W/O CM
2 of 6 series · 15 of 46 positions shown, 17 images · IV contrast (APPLIED)
Comparison: None.

CLINICAL DATA: GI bleed

EXAM:
CTA ABDOMEN AND PELVIS WITHOUT AND WITH CONTRAST
TECHNIQUE: Multidetector CT imaging of the abdomen and pelvis was performed
using the standard protocol during bolus administration of
intravenous contrast. Multiplanar reconstructed images and MIPs were
obtained and reviewed to evaluate the vascular anatomy.
CONTRAST:  100mL OMNIPAQUE IOHEXOL 350 MG/ML SOLN

[Series 8: arterial · axial · arterial · 0.73mm/px · z∈[-466,-38]mm · 12 of 248 slices shown, 14 images]
[im 17/248  soft-tissue]
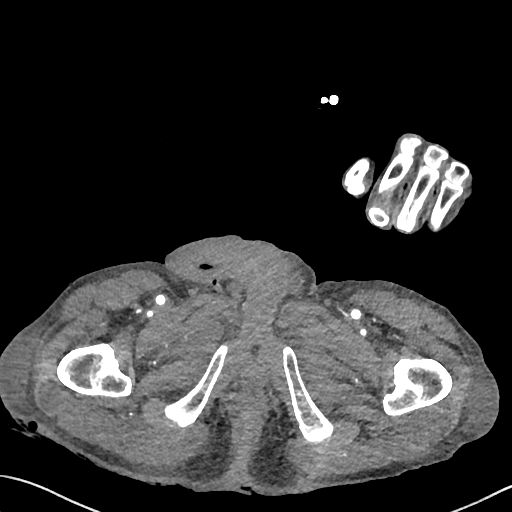
[im 17/248  bone]
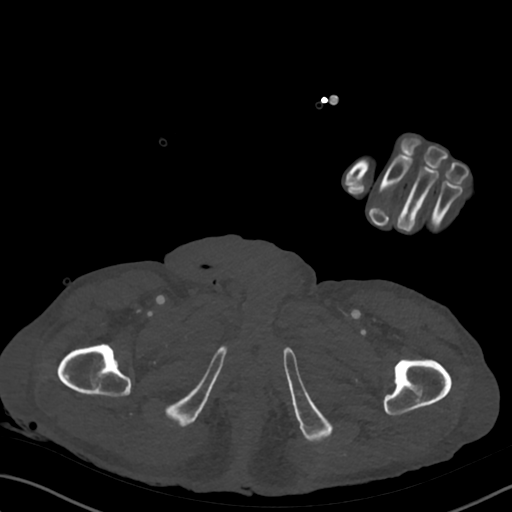
[im 33/248  soft-tissue]
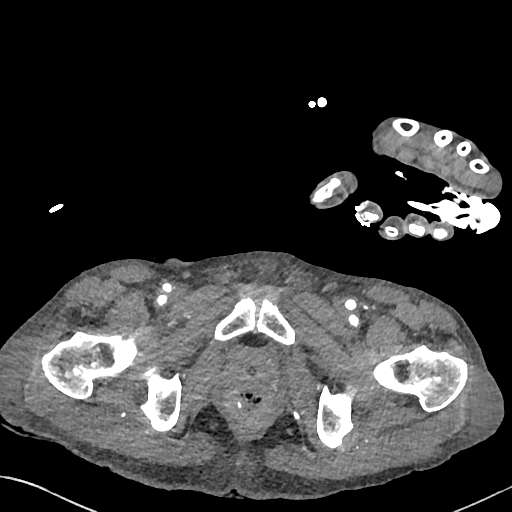
[im 50/248  soft-tissue]
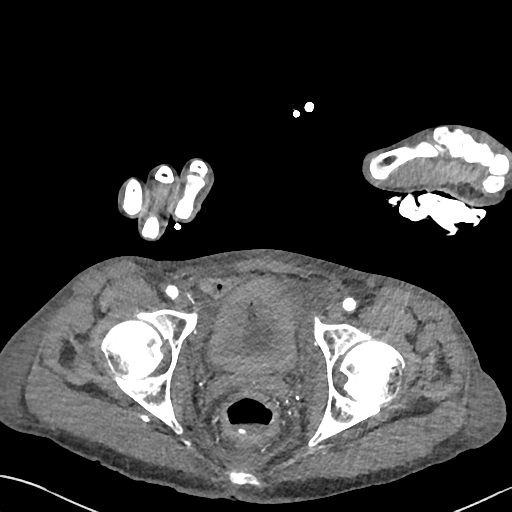
[im 83/248  soft-tissue]
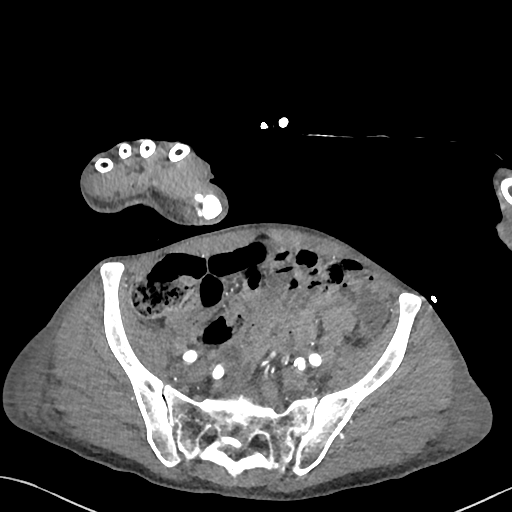
[im 99/248  soft-tissue]
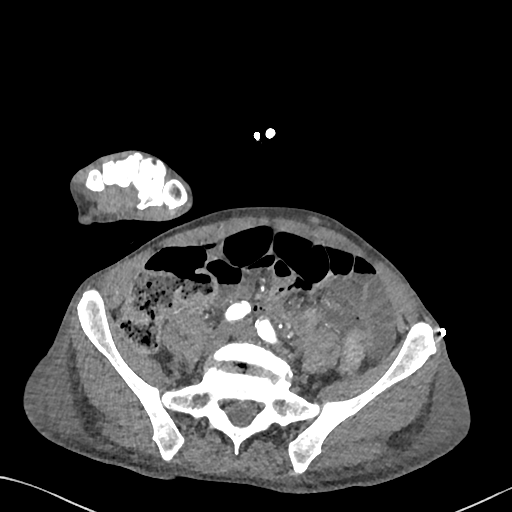
[im 116/248  soft-tissue]
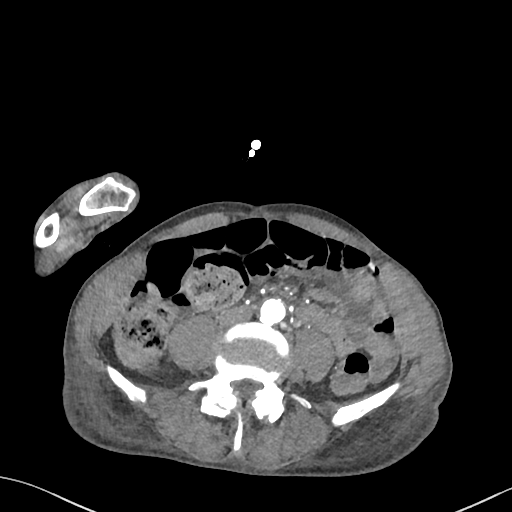
[im 132/248  soft-tissue]
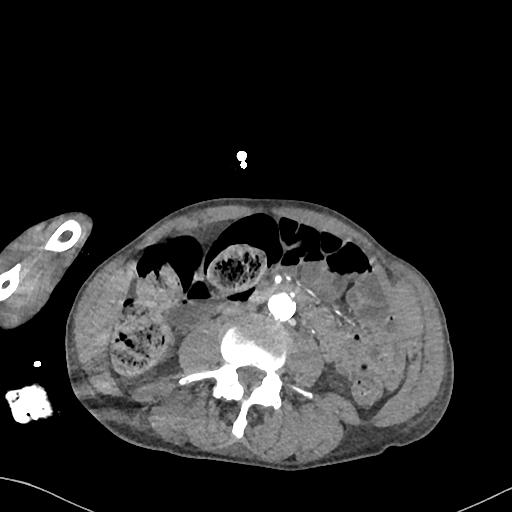
[im 149/248  soft-tissue]
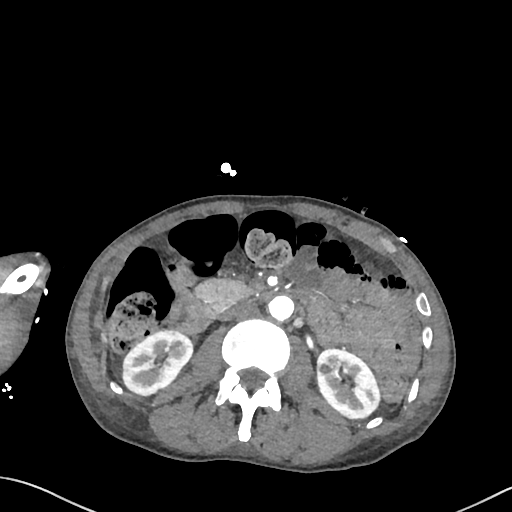
[im 165/248  soft-tissue]
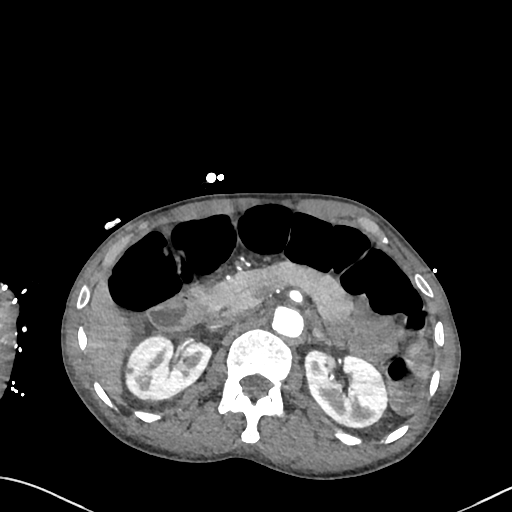
[im 165/248  bone]
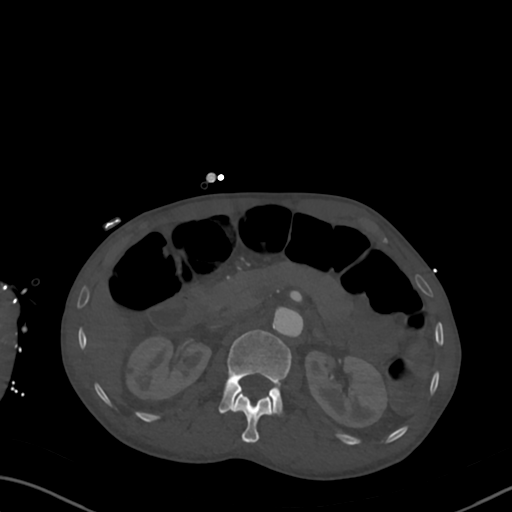
[im 198/248  soft-tissue]
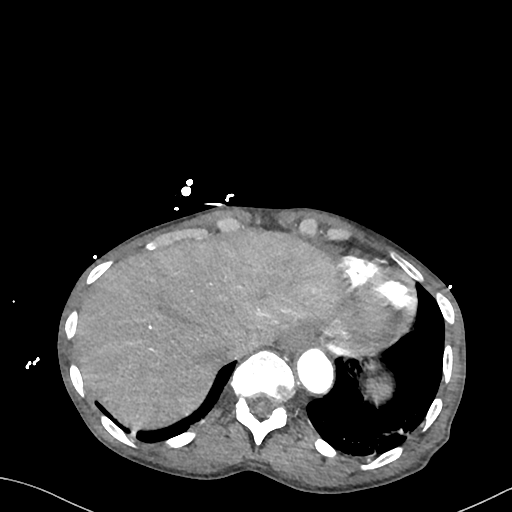
[im 215/248  soft-tissue]
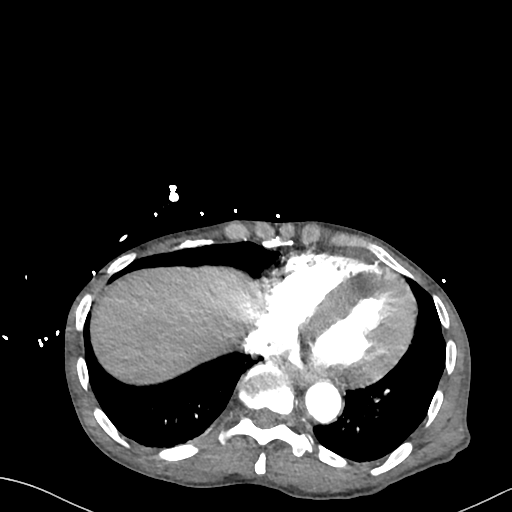
[im 231/248  soft-tissue]
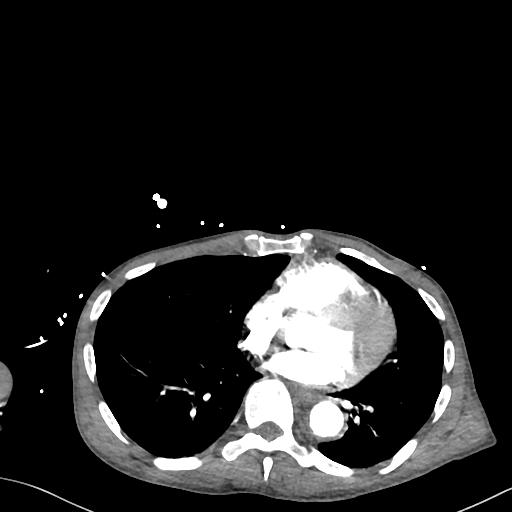

[Series 11: cor · coronal · 0.67mm/px · 3 of 149 slices shown]
[im 38/149  soft-tissue]
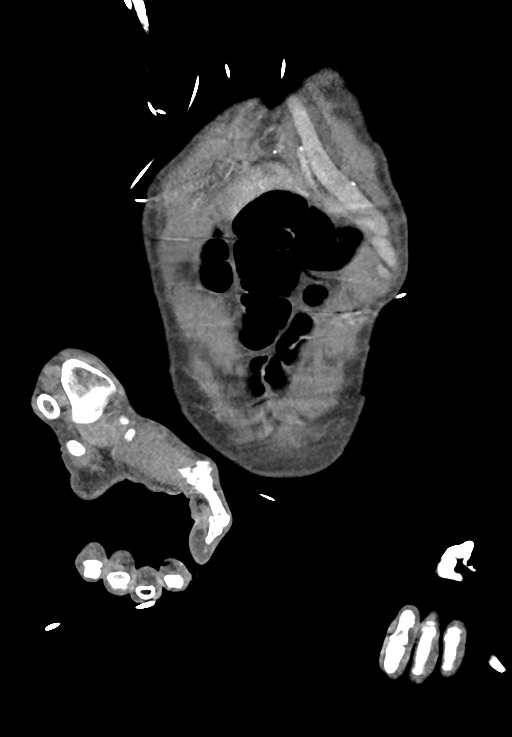
[im 75/149  soft-tissue]
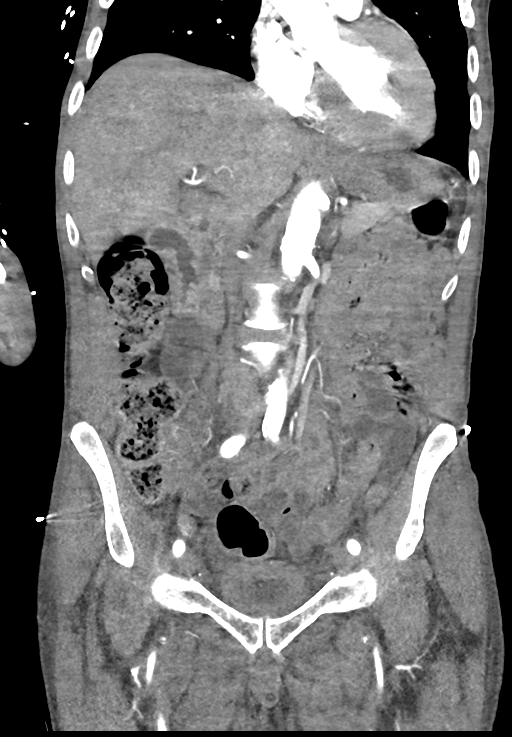
[im 112/149  soft-tissue]
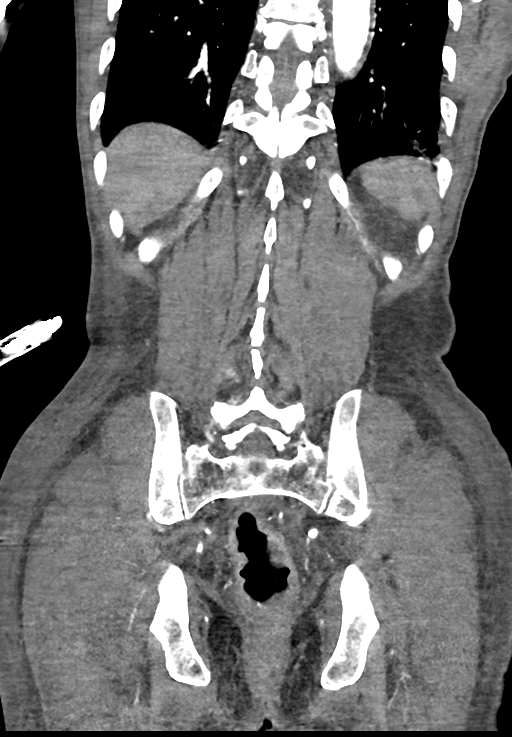

[15 of 46 positions shown; findings below may reference images not displayed]

FINDINGS: VASCULAR

Aorta: Mixed density aortic atherosclerosis without aneurysm,
stenosis or dissection.

Celiac: Patent without evidence of aneurysm, dissection, vasculitis
or significant stenosis.

SMA: Patent without evidence of aneurysm, dissection, vasculitis or
significant stenosis.

Renals: Both renal arteries are patent without evidence of aneurysm,
dissection, vasculitis, fibromuscular dysplasia or significant
stenosis.

IMA: Patent without evidence of aneurysm, dissection, vasculitis or
significant stenosis.

Inflow: Moderate mixed density atherosclerosis of the proximal left
internal iliac artery.

Proximal Outflow: Bilateral common femoral and visualized portions
of the superficial and profunda femoral arteries are patent without
evidence of aneurysm, dissection, vasculitis or significant
stenosis.

Veins: No obvious venous abnormality within the limitations of this
arterial phase study.

Review of the MIP images confirms the above findings.

NON-VASCULAR

Lower chest: Emphysema

Hepatobiliary: No focal liver abnormality is seen. No gallstones,
gallbladder wall thickening, or biliary dilatation.

Pancreas: Unremarkable. No pancreatic ductal dilatation or
surrounding inflammatory changes.

Spleen: Normal in size without focal abnormality.

Adrenals/Urinary Tract: Adrenal glands are unremarkable. Kidneys are
normal, without renal calculi, focal lesion, or hydronephrosis.
Thick-walled urinary bladder with Foley catheter.

Stomach/Bowel: There is intraluminal high density material
interspersed with fluid in the distal rectum. This may arise from 1
of the small perirectal perforating vessels. No other abnormal
density. No small bowel obstruction.

Lymphatic: No enlarged lymph nodes.

Reproductive: Prostate is unremarkable.

Other: No abdominal wall hernia or abnormality. No abdominopelvic
ascites.

Musculoskeletal: No acute or significant osseous findings.
IMPRESSION: 1. Intraluminal high density material interspersed with fluid in the
distal rectum, possibly arising from a small perirectal perforating
vessels. This could indicate active hemorrhage or angiodysplasia.
2. No other acute findings.

These results will be called to the ordering clinician or
representative by the Radiologist Assistant, and communication
documented in the PACS or [REDACTED].

Aortic Atherosclerosis ([V9]-[V9]) and Emphysema ([V9]-[V9]).

## 2020-11-27 SURGERY — SIGMOIDOSCOPY, FLEXIBLE
Anesthesia: Moderate Sedation

## 2020-11-27 MED ORDER — SODIUM CHLORIDE 0.9 % IV BOLUS: 500.0000 mL | Freq: Once | INTRAVENOUS | Status: AC

## 2020-11-27 MED ORDER — CEFAZOLIN SODIUM-DEXTROSE 2-4 GM/100ML-% IV SOLN
INTRAVENOUS | Status: AC
  Filled 2020-11-27: qty 100

## 2020-11-27 MED ORDER — LIDOCAINE HCL 1 % IJ SOLN
INTRAMUSCULAR | Status: AC
  Filled 2020-11-27: qty 20

## 2020-11-27 MED ORDER — SODIUM CHLORIDE 0.9% IV SOLUTION: Freq: Once | INTRAVENOUS | Status: DC

## 2020-11-27 MED ORDER — GELATIN ABSORBABLE 12-7 MM EX MISC
CUTANEOUS | Status: AC
  Filled 2020-11-27: qty 1

## 2020-11-27 MED ORDER — NOREPINEPHRINE 4 MG/250ML-% IV SOLN
2.0000 ug/min | INTRAVENOUS | Status: DC
  Administered 2020-11-27: 5 ug/min via INTRAVENOUS
  Administered 2020-11-28: 3 ug/min via INTRAVENOUS
  Filled 2020-11-27 (×2): qty 250

## 2020-11-27 MED ORDER — IOHEXOL 300 MG/ML  SOLN
100.0000 mL | Freq: Once | INTRAMUSCULAR | Status: AC | PRN
  Administered 2020-11-27: 60 mL via INTRA_ARTERIAL

## 2020-11-27 MED ORDER — FENTANYL CITRATE (PF) 100 MCG/2ML IJ SOLN
INTRAMUSCULAR | Status: AC
  Filled 2020-11-27: qty 2

## 2020-11-27 MED ORDER — CEFAZOLIN SODIUM-DEXTROSE 1-4 GM/50ML-% IV SOLN
INTRAVENOUS | Status: AC | PRN
  Administered 2020-11-27: 2 g via INTRAVENOUS

## 2020-11-27 MED ORDER — SODIUM CHLORIDE 0.9 % IV SOLN: INTRAVENOUS | Status: DC

## 2020-11-27 MED ORDER — MIDAZOLAM HCL 2 MG/2ML IJ SOLN
INTRAMUSCULAR | Status: AC
  Filled 2020-11-27: qty 2

## 2020-11-27 MED ORDER — MIDAZOLAM HCL (PF) 5 MG/ML IJ SOLN
INTRAMUSCULAR | Status: AC
  Filled 2020-11-27: qty 1

## 2020-11-27 MED ORDER — IOHEXOL 300 MG/ML  SOLN
150.0000 mL | Freq: Once | INTRAMUSCULAR | Status: AC | PRN
  Administered 2020-11-27: 50 mL via INTRA_ARTERIAL

## 2020-11-27 MED ORDER — PHENYLEPHRINE HCL-NACL 10-0.9 MG/250ML-% IV SOLN
25.0000 ug/min | INTRAVENOUS | Status: DC
  Administered 2020-11-27: 25 ug/min via INTRAVENOUS
  Filled 2020-11-27: qty 250

## 2020-11-27 MED ORDER — SODIUM CHLORIDE 0.9 % IV SOLN: 250.0000 mL | INTRAVENOUS | Status: DC

## 2020-11-27 MED ORDER — MAGNESIUM SULFATE 2 GM/50ML IV SOLN
2.0000 g | Freq: Once | INTRAVENOUS | Status: AC
  Administered 2020-11-27: 2 g via INTRAVENOUS
  Filled 2020-11-27: qty 50

## 2020-11-27 MED ORDER — IOHEXOL 350 MG/ML SOLN
100.0000 mL | Freq: Once | INTRAVENOUS | Status: AC | PRN
  Administered 2020-11-27: 100 mL via INTRAVENOUS

## 2020-11-27 MED ORDER — SODIUM CHLORIDE 0.9 % IV BOLUS
500.0000 mL | Freq: Once | INTRAVENOUS | Status: AC
  Administered 2020-11-27: 500 mL via INTRAVENOUS

## 2020-11-27 NOTE — Progress Notes (Signed)
Case d/w IR -- CTA a/p being reviewed to eval for possible intervention, discussed plans for supportive care in interim.   -Transfuse plt, PRBC as needed -place rectal tube for possible tamponade     Eliseo Gum MSN, AGACNP-BC [place] for pager details 11/27/2020, 4:52 AM

## 2020-11-27 NOTE — Interval H&P Note (Signed)
History and Physical Interval Note:  11/27/2020 11:37 AM  [name]  has presented today for surgery, with the diagnosis of Rectal bleeding.  The various methods of treatment have been discussed with the patient and family. After consideration of risks, benefits and other options for treatment, the patient has consented to  Procedure(s) with comments: FLEXIBLE SIGMOIDOSCOPY (N/A) - Bedside as a surgical intervention.  The patient's history has been reviewed, patient examined, no change in status, stable for surgery.  I have reviewed the patient's chart and labs.  Questions were answered to the patient's satisfaction.     [name]

## 2020-11-27 NOTE — Progress Notes (Incomplete)
Critical care attending attestation note:  Patient seen and examined and relevant ancillary tests reviewed.  I agree with the assessment and plan of care as outlined by Eliseo Gum, APP. This patient was not seen as a shared visit. The following reflects my independent critical care time.   Synopsis of assessment and plan:  # Hypotension # BRBPR: has had presumptive radiation related rectal bleeding # Multifocal acute/subacute ischemic stroke:recently started on DAPT for 3 week course # History of anal squamous cell cancer # CHF # HIV/AIDS  - transfusing 2U pRBC - hold antihypertensives, DAPT for now - check coags, trend CBC - CTA abdomen/pelvis    CRITICAL CARE Performed by: Maryjane Hurter   Total critical care time: 37 minutes  Critical care time was exclusive of separately billable procedures and treating other patients.  Critical care was necessary to treat or prevent imminent or life-threatening deterioration.  Critical care was time spent personally by me on the following activities: development of treatment plan with patient and/or surrogate as well as nursing, discussions with consultants, evaluation of patient's response to treatment, examination of patient, obtaining history from patient or surrogate, ordering and performing treatments and interventions, ordering and review of laboratory studies, ordering and review of radiographic studies, re-evaluation of patient's condition and participation in multidisciplinary rounds.  Letta Median, Pulmonary/Critical Care

## 2020-11-27 NOTE — Consult Note (Signed)
Chief Complaint: Patient was seen in consultation today for  Chief Complaint  Patient presents with  . Altered Mental Status   Patient Status: [name] - In-pt  History of Present Illness: [name] is a 65 y.o. male with past medical history of rectal carcinoma, HIV, HCV, HTN, HLD, polysubstance abuse, presented to the ED on 11/17/2020 with altered mental status.  He developed GI bleed on 11/25/2020 with significant hypotension. CTA shows active extravasation at the level of the rectum.  IR consulted for angiogram and possible embolization.  Direct patient interview could not be performed due to patient's condition.  Past Medical History:  Diagnosis Date  . Acute kidney failure    unspec, secondary to medications  . AIDS ([place]) 06/07/2016  . Alcohol abuse 05/03/2016  . anal ca dx'd 02/2011  . Cachexia ([place])   . 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 Drug abuse ([place])   . GERD (gastroesophageal reflux disease) 08/09/2016  . Grieving 11/29/2017  . Hemorrhoid   . Hepatitis C   . History of radiation therapy 01/01/2011 -05/18/11   anal, pelvis, inguinal lymph nodes  . HIV (human immunodeficiency virus infection) ([place])   . HIV or AIDS   . Housing problems 10/22/2020  . Hypertension   . Loose stools 06/07/2016  . Mass of anus 01/26/2011   squamous cell cancer  . Meningitis   . Mood disorder ([place])   . Pain    right leg  . Pulmonary nodule   . Rash and nonspecific skin eruption 03/31/2015  . Rectal bleeding 08/09/2020  . Testicular hypofunction     Past Surgical History:  Procedure Laterality Date  . BUBBLE STUDY  11/19/2020   Procedure: BUBBLE STUDY;  Surgeon: [name], MD;  Location: [place];  Service: Cardiology;;  . MASS EXCISION  01/26/2011   squamous cell carcinoma  . TEE WITHOUT CARDIOVERSION N/A 11/19/2020   Procedure: TRANSESOPHAGEAL ECHOCARDIOGRAM (TEE);  Surgeon: [name], MD;  Location: [place];  Service: Cardiology;  Laterality: N/A;     Allergies: Patient has no known allergies.  Medications: Prior to Admission medications   Medication Sig Start Date End Date Taking? Authorizing Provider  bictegravir-emtricitabine-tenofovir AF (BIKTARVY) 50-200-25 MG TABS tablet Take 1 tablet by mouth daily. 11/25/20   Velna Ochs, MD  clotrimazole (LOTRIMIN) 1 % cream Apply to affected area 2 times daily 10/06/19   Faustino Congress, NP  Darunavir-Cobicisctat-Emtricitabine-Tenofovir Alafenamide [facility]) 800-150-200-10 MG TABS Take 1 tablet by mouth daily with breakfast. 04/21/20   [name], [name], MD  HYDROcodone-acetaminophen (NORCO/VICODIN) 5-325 MG tablet TAKE 1 TABLET BY MOUTH EVERY 8 HOURS AS NEEDED FOR UP TO 5 DAYS FOR SEVERE PAIN NOT IMPROVED BY YOUR SCHEDULED ACETAMINOPHEN -NO MORE THAN 4000MG  PER DAY 08/25/20 02/21/21  Cardama, Grayce Sessions, MD  mupirocin ointment (BACTROBAN) 2 % APPLY TOPICALLY 2 (TWO) TIMES DAILY FOR 10 DAYS. APPLY TO HEAD OF PENIS 08/25/20 08/25/21  Cardama, Grayce Sessions, MD  omeprazole (PRILOSEC) 40 MG capsule Take 1 capsule (40 mg total) by mouth daily. 08/04/19   Golden Circle, FNP  oxyCODONE-acetaminophen (PERCOCET/ROXICET) 5-325 MG tablet Take 2 tablets by mouth every 6 (six) hours as needed for severe pain. 10/23/20   Noemi Chapel, MD  oxyCODONE-acetaminophen (PERCOCET/ROXICET) 5-325 MG tablet TAKE 2 TABLETS BY MOUTH EVERY 6 HOURS AS NEEDED FOR SEVERE PAIN. 10/23/20 04/21/21  Domenic Moras, PA-C     Family History  Family history unknown: Yes    Social History   Socioeconomic History  .  Marital status: Single    Spouse name: Not on file  . Number of children: Not on file  . Years of education: Not on file  . Highest education level: Not on file  Occupational History  . Not on file  Tobacco Use  . Smoking status: Current Every Day Smoker  . Smokeless tobacco: Never Used  . Tobacco comment: 2-3 day  Vaping Use  . Vaping Use: Never used  Substance and Sexual Activity  . Alcohol use:  Yes    Alcohol/week: 6.0 standard drinks    Types: 6 Cans of beer per week  . Drug use: Not Currently  . Sexual activity: Not Currently    Partners: Male    Birth control/protection: Condom    Comment: given condoms  Other Topics Concern  . Not on file  Social History Narrative   ** Merged History Encounter **       Social Determinants of Health   Financial Resource Strain: Not on file  Food Insecurity: Not on file  Transportation Needs: Not on file  Physical Activity: Not on file  Stress: Not on file  Social Connections: Not on file   Review of Systems:  Unable to perform due to patient condition.  Vital Signs: BP 94/67   Pulse (!) 108   Temp (!) 97.5 F (36.4 C) (Oral)   Resp (!) 33   Ht 5\' 7"  (1.702 m)   Wt 60.7 kg   SpO2 96%   BMI 20.96 kg/m   Physical exam: was not performed.  Imaging: CT Head Wo Contrast  Result Date: 11/16/2020 CLINICAL DATA:  Altered mental status. History of anorectal carcinoma. EXAM: CT HEAD WITHOUT CONTRAST TECHNIQUE: Contiguous axial images were obtained from the base of the skull through the vertex without intravenous contrast. COMPARISON:  Head CT 10/19/2008. FINDINGS: Brain: There is a rounded focus of hypoattenuation in the posterior right parietal lobe measuring 2.1 by 1.7 by 1.3 cm. Scattered hypoattenuation in the subcortical and periventricular deep white matter is noted. Remote lacunar infarctions in the basal ganglia are seen. No hemorrhage, hydrocephalus or midline shift. Vascular: Atherosclerosis noted. Skull: Intact.  No focal lesion. Sinuses/Orbits: Negative. Other: None. IMPRESSION: Rounded focus of hypoattenuation in the posterior right parietal lobe may be due to an infarct but cannot be definitively characterized. Brain MRI with and without contrast is recommended for further evaluation. Chronic microvascular ischemic change. Electronically Signed   By: Inge Rise M.D.   On: 11/16/2020 14:43   MR ANGIO NECK W WO  CONTRAST  Result Date: 11/19/2020 CLINICAL DATA:  Stroke, follow-up. EXAM: MRA NECK WITHOUT AND WITH CONTRAST TECHNIQUE: Multiplanar and multiecho pulse sequences of the neck were obtained without and with intravenous contrast. Angiographic images of the neck were obtained using MRA technique without and with intravenous contrast. CONTRAST:  60mL GADAVIST GADOBUTROL 1 MMOL/ML IV SOLN COMPARISON:  No pertinent prior exams available for comparison. FINDINGS: Mildly motion degraded exam. The visualized aortic arch is normal in caliber. Included portions of the innominate and proximal subclavian arteries have no hemodynamically significant stenosis. The bilateral common and internal carotid arteries are patent within the neck without hemodynamically significant stenosis. Mild atherosclerotic plaque within the distal right common carotid artery. The vertebral arteries are codominant and patent within the neck. Moderate/severe focal stenosis within the right vertebral artery V1 segment. IMPRESSION: The bilateral common carotid and internal carotid arteries are patent within the neck without hemodynamically significant stenosis. Mild atherosclerotic plaque within the distal right common carotid artery.  The vertebral arteries are codominant and patent within the neck with antegrade flow bilaterally. Moderate/severe focal stenosis within the right vertebral artery V1 segment. Electronically Signed   By: Kellie Simmering DO   On: 11/19/2020 10:28   MR BRAIN WO CONTRAST  Result Date: 11/16/2020 CLINICAL DATA:  Initial evaluation for acute mental status change, unknown cause. EXAM: MRI HEAD WITHOUT CONTRAST TECHNIQUE: Multiplanar, multiecho pulse sequences of the brain and surrounding structures were obtained without intravenous contrast. COMPARISON:  Prior head CT from earlier the same day. FINDINGS: Brain: Diffuse prominence of the CSF containing spaces compatible with generalized cerebral atrophy. Patchy T2/FLAIR  hyperintensity within the periventricular and deep white matter both cerebral hemispheres as well as the pons, consistent with chronic microvascular ischemic disease, moderate in nature. 2.6 area of restricted diffusion involving the cortical and subcortical aspect of the right occipital pole, consistent with acute to early subacute ischemic infarct (series 5, image 76). Finding corresponds with abnormality on prior CT. Associated mild petechial hemorrhage without frank hemorrhagic transformation or significant mass effect. Multiple additional scattered foci of restricted diffusion involving the cortical and subcortical aspect of the bilateral cerebral hemispheres are seen, also consistent with acute ischemic changes (series 5, images 91, 86, 79). Patchy involvement of the caudate and lentiform nuclei noted bilaterally. Few additional scattered punctate ischemic infarcts noted involving the bilateral cerebellar hemispheres. No associated hemorrhage or significant mass effect. Additionally, there is abnormal symmetric restricted diffusion and FLAIR signal abnormality involving the globus pallidi bilaterally (series 5, image 75). Appearance is most typical of a superimposed hypoxic ischemic injury versus toxic metabolic derangement. Additionally, there is abnormal restricted diffusion involving the mesial left temporal lobe with involvement of the left hippocampus (series 5, image 70). While this finding may be ischemic in nature, superimposed changes of seizure could also be considered. No mass lesion, significant mass effect, or midline shift. No hydrocephalus or extra-axial fluid collection. Pituitary gland and suprasellar region normal. Midline structures intact. Vascular: Major intracranial vascular flow voids are maintained. Skull and upper cervical spine: Craniocervical junction within normal limits. Signal intensity within the visualized bone marrow of the upper cervical spine is diffusely heterogeneous and  decreased on T1 weighted imaging, nonspecific, but most commonly related to anemia, smoking, or obesity. No focal marrow replacing lesion. No scalp soft tissue abnormality. Sinuses/Orbits: Globes and orbital soft tissues demonstrate no acute finding. Scattered mucosal thickening noted throughout the paranasal sinuses. No significant mastoid effusion. Inner ear structures grossly normal. Other: None. IMPRESSION: 1. 2.6 area of acute to early subacute ischemic infarct involving the right occipital pole, corresponding with abnormality on prior CT. Associated mild petechial hemorrhage without frank hemorrhagic transformation or significant mass effect. 2. Multiple additional scattered acute to early subacute ischemic infarcts involving the cortical and subcortical aspect of both cerebral and cerebellar hemispheres. Overall, a central thromboembolic etiology is suspected given the various vascular distributions involved. 3. Additional abnormal symmetric restricted diffusion involving the globus pallidi bilaterally. Appearance is most characteristic of a superimposed/concomitant hypoxic ischemic injury versus toxic metabolic derangement. 4. Restricted diffusion involving the mesial left temporal lobe with involvement of the left hippocampus. While this finding may be ischemic in nature as well, superimposed changes of seizure and/or limbic encephalitis could also be considered. Correlation with EEG recommended. 5. Underlying age-related cerebral atrophy with moderate chronic microvascular ischemic disease. Electronically Signed   By: Jeannine Boga M.D.   On: 11/16/2020 21:26   DG Chest Portable 1 View  Result Date: 11/16/2020 CLINICAL DATA:  Altered  mental status EXAM: PORTABLE CHEST 1 VIEW COMPARISON:  08/24/2020 FINDINGS: Cardiac shadow is within normal limits. Tortuous thoracic aorta is noted accentuated by patient rotation. The lungs are clear bilaterally. Old rib fractures on the left are seen and stable.  No new focal abnormality is noted. IMPRESSION: No acute abnormality seen. Electronically Signed   By: Inez Catalina M.D.   On: 11/16/2020 12:00   DG Abd Portable 1V  Result Date: 11/20/2020 CLINICAL DATA:  Assess for NG tube placement. EXAM: PORTABLE ABDOMEN - 1 VIEW COMPARISON:  June 21, 2021 FINDINGS: Feeding tube is identified slightly coiled in the body of stomach. Bowel gas pattern is normal. IMPRESSION: Feeding tube is identified slightly coiled in the body of stomach. Electronically Signed   By: Abelardo Diesel M.D.   On: 11/20/2020 11:41   DG Abd Portable 1V  Result Date: 11/19/2020 CLINICAL DATA:  NG tube placement EXAM: PORTABLE ABDOMEN - 1 VIEW COMPARISON:  None FINDINGS: Feeding tube is in place. This folds on itself in the distal stomach with the tip in the mid stomach. IMPRESSION: Feeding tube loops in the stomach with the tip in the mid stomach. Electronically Signed   By: Rolm Baptise M.D.   On: 11/19/2020 19:14   DG Swallowing Func-Speech Pathology  Result Date: 11/20/2020 Objective Swallowing Evaluation: Type of Study: MBS-Modified Barium Swallow Study  Patient Details Name: [name] MRN: [id number] Date of Birth: 1956-08-07 Today's Date: 11/20/2020 Time: SLP Start Time (ACUTE ONLY): 1502 -SLP Stop Time (ACUTE ONLY): 1512 SLP Time Calculation (min) (ACUTE ONLY): 10 min Past Medical History: Past Medical History: Diagnosis Date . Acute kidney failure   unspec, secondary to medications . AIDS ([place]) 06/07/2016 . Alcohol abuse 05/03/2016 . anal ca dx'd 02/2011 . Cachexia ([place])  . 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 Drug abuse ([place])  . GERD (gastroesophageal reflux disease) 08/09/2016 . Grieving 11/29/2017 . Hemorrhoid  . Hepatitis C  . History of radiation therapy 01/01/2011 -05/18/11  anal, pelvis, inguinal lymph nodes . HIV (human immunodeficiency virus infection) ([place])  . HIV or AIDS  . Housing problems 10/22/2020 . Hypertension  . Loose stools 06/07/2016 . Mass of anus 01/26/2011  squamous cell  cancer . Meningitis  . Mood disorder ([place])  . Pain   right leg . Pulmonary nodule  . Rash and nonspecific skin eruption 03/31/2015 . Rectal bleeding 08/09/2020 . Testicular hypofunction  Past Surgical History: Past Surgical History: Procedure Laterality Date . MASS EXCISION  01/26/2011  squamous cell carcinoma HPI: [name] is a 65 y.o. 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 He was found to be encephalopathic at attended with acute renal failure and rhabdomyolysis.  He also has new bullous skin lesions on hands and legs. Pt was found to have multiple infarcts and anoxic changes on MRI 4/12.  CXR 4/12 with no acute findings.  Subjective: Pt awake/alert, pleasant, participative Assessment / Plan / Recommendation CHL IP CLINICAL IMPRESSIONS 11/20/2020 Clinical Impression Pt presents with mild oropharyngeal dysphagia c/b prolonged mastication, premature spillage, delayed swallow initiation, and reduced base of tongue retraction.  These deficits resulted in trace, transient penetration of thin liquid by straw prior to the swallow.  Cup sips, including serial cup sips, prevented penetration of liquids.  There was no penetration or aspiration of any other consistencies trialed.  There was frequent premature spillage with all consistencies to the  level of the pyriform sinuses prior to the swallow with bolus remaining in pharynx for period of time increasing with advancing texture of bolus. There was mild vallecula residue with solids. With solids, frontal munching pattern noted with prolonged oral phase and decreased bolus cohesion. Recommend chopped/ground (dysphagia 2) diet with thin liuqids by cup only. SLP Visit Diagnosis -- Attention and concentration deficit following -- Frontal lobe and executive function deficit following -- Impact on safety and function Mild aspiration  risk   CHL IP TREATMENT RECOMMENDATION 11/20/2020 Treatment Recommendations Therapy as outlined in treatment plan below   Prognosis 11/20/2020 Prognosis for Safe Diet Advancement Good Barriers to Reach Goals -- Barriers/Prognosis Comment -- CHL IP DIET RECOMMENDATION 11/20/2020 SLP Diet Recommendations Dysphagia 2 (Fine chop) solids;Thin liquid Liquid Administration via Cup;No straw Medication Administration Crushed with puree Compensations Slow rate;Small sips/bites;Minimize environmental distractions Postural Changes Seated upright at 90 degrees   CHL IP OTHER RECOMMENDATIONS 11/20/2020 Recommended Consults -- Oral Care Recommendations Oral care BID Other Recommendations --   CHL IP FOLLOW UP RECOMMENDATIONS 11/20/2020 Follow up Recommendations (No Data)   CHL IP FREQUENCY AND DURATION 11/20/2020 Speech Therapy Frequency (ACUTE ONLY) min 2x/week Treatment Duration 2 weeks      CHL IP ORAL PHASE 11/20/2020 Oral Phase Impaired Oral - Pudding Teaspoon -- Oral - Pudding Cup -- Oral - Honey Teaspoon -- Oral - Honey Cup -- Oral - Nectar Teaspoon -- Oral - Nectar Cup Premature spillage Oral - Nectar Straw Premature spillage Oral - Thin Teaspoon -- Oral - Thin Cup Premature spillage Oral - Thin Straw Premature spillage Oral - Puree Lingual pumping;Premature spillage Oral - Mech Soft -- Oral - Regular Premature spillage;Impaired mastication;Lingual pumping;Reduced posterior propulsion Oral - Multi-Consistency -- Oral - Pill -- Oral Phase - Comment --  CHL IP PHARYNGEAL PHASE 11/20/2020 Pharyngeal Phase Impaired Pharyngeal- Pudding Teaspoon -- Pharyngeal -- Pharyngeal- Pudding Cup -- Pharyngeal -- Pharyngeal- Honey Teaspoon -- Pharyngeal -- Pharyngeal- Honey Cup -- Pharyngeal -- Pharyngeal- Nectar Teaspoon -- Pharyngeal -- Pharyngeal- Nectar Cup Delayed swallow initiation-pyriform sinuses Pharyngeal Material does not enter airway Pharyngeal- Nectar Straw Delayed swallow initiation-pyriform sinuses Pharyngeal Material does not enter  airway Pharyngeal- Thin Teaspoon -- Pharyngeal -- Pharyngeal- Thin Cup Delayed swallow initiation-pyriform sinuses Pharyngeal Material does not enter airway Pharyngeal- Thin Straw Delayed swallow initiation-pyriform sinuses;Penetration/Aspiration before swallow Pharyngeal Material enters airway, remains ABOVE vocal cords then ejected out Pharyngeal- Puree Delayed swallow initiation-pyriform sinuses Pharyngeal Material does not enter airway Pharyngeal- Mechanical Soft -- Pharyngeal -- Pharyngeal- Regular Delayed swallow initiation-pyriform sinuses Pharyngeal Material does not enter airway Pharyngeal- Multi-consistency -- Pharyngeal -- Pharyngeal- Pill -- Pharyngeal -- Pharyngeal Comment --  CHL IP CERVICAL ESOPHAGEAL PHASE 11/20/2020 Cervical Esophageal Phase WFL Pudding Teaspoon -- Pudding Cup -- Honey Teaspoon -- Honey Cup -- Nectar Teaspoon -- Nectar Cup -- Nectar Straw -- Thin Teaspoon -- Thin Cup -- Thin Straw -- Puree -- Mechanical Soft -- Regular -- Multi-consistency -- Pill -- Cervical Esophageal Comment -- Celedonio Savage, MA, CCC-SLP Acute Rehabilitation Services Office: [phone] 11/20/2020, 4:23 PM              EEG adult  Result Date: 11/17/2020 Derek Jack, MD     11/17/2020  8:00 AM Routine EEG Report [name] is a 65 y.o. male with a history of acute encephalopathy who is undergoing an EEG to evaluate for seizures. Report: This EEG was acquired with electrodes placed according to the International 10-20 electrode system (including Fp1, Fp2, F3, F4, C3,  C4, P3, P4, O1, O2, T3, T4, T5, T6, A1, A2, Fz, Cz, Pz). The following electrodes were missing or displaced: none. The best background activity was 5-6 Hz with intermittent delta slowing. This activity is reactive to stimulation. Sleep was characterized by vertex waves, sleep spindles (12 to 14 Hz). There was no focal slowing. There were no interictal epileptiform discharges. There were no electrographic seizures identified. Photic  stimulation and hyperventilation were not performed. Impression and clinical correlation: This EEG was obtained while obtunded and is abnormal due to moderate diffuse slowing suggestive of general cerebral dysfunction. Su Monks, MD [facility] [phone] If 7pm- 7am, please page neurology on call as listed in [place].     ECHO TEE  Result Date: 11/20/2020    TRANSESOPHOGEAL ECHO REPORT   Patient Name:   [name] Date of Exam: 11/19/2020 Medical Rec #:  [id number]          Height:       67.0 in Accession #:    [id number]         Weight:       137.3 lb Date of Birth:  1956/05/01          BSA:          1.724 m Patient Age:    59 years           BP:           154/90 mmHg Patient Gender: M                  HR:           77 bpm. Exam Location:  Inpatient Procedure: 3D Echo, Transesophageal Echo, Cardiac Doppler, Color Doppler and            Saline Contrast Bubble Study                               MODIFIED REPORT: This report was modified by Cherlynn Kaiser MD on 11/20/2020 due to revision.  Indications:     Stroke 434.91 / I63.9  History:         Patient has prior history of Echocardiogram examinations, most                  recent 11/17/2020. Risk Factors:Hypertension. HIV. Hepatitis C.                  Substance abuse.  Sonographer:     Darlina Sicilian RDCS Referring Phys:  [id number] [name] Diagnosing Phys: Cherlynn Kaiser MD PROCEDURE: After discussion of the risks and benefits of a TEE, an informed consent was obtained from a family member. TEE procedure time was 13 minutes. The transesophogeal probe was passed without difficulty through the esophogus of the patient. Imaged  were obtained with the patient in a left lateral decubitus position. Local oropharyngeal anesthetic was provided with Benzocaine spray. Sedation performed by different physician. The patient was monitored while under deep sedation. Anesthestetic sedation was provided intravenously by Anesthesiology: 80.74mg  of  Propofol. Image quality was good. The patient's vital signs; including heart rate, blood pressure, and oxygen saturation; remained stable throughout the procedure. The patient developed no  complications during the procedure. Phenylephrine 140 mcg, 27mcg dexmedetomidine. IMPRESSIONS  1. Left ventricular ejection fraction, by estimation, is 45 to 50%. The left ventricle has mildly decreased function.  2. Right ventricular systolic function is normal. The right ventricular size is normal.  3. No left atrial/left atrial appendage thrombus was detected.  4. The mitral valve is grossly normal. Trivial mitral valve regurgitation.  5. The aortic valve is normal in structure. Aortic valve regurgitation is mild. No aortic stenosis is present.  6. There is mild (Grade II) atheroma plaque involving the transverse aorta.  7. Agitated saline contrast bubble study was positive with shunting observed after >6 cardiac cycles suggestive of intrapulmonary shunting. Conclusion(s)/Recommendation(s): No LA/LAA thrombus identified. Negative bubble study for interatrial shunt. No intracardiac source of embolism detected on this on this transesophageal echocardiogram. FINDINGS  Left Ventricle: Left ventricular ejection fraction, by estimation, is 45 to 50%. The left ventricle has mildly decreased function. The left ventricular internal cavity size was normal in size. Right Ventricle: The right ventricular size is normal. No increase in right ventricular wall thickness. Right ventricular systolic function is normal. Left Atrium: Left atrial size was normal in size. No left atrial/left atrial appendage thrombus was detected. Right Atrium: Right atrial size was normal in size. Pericardium: There is no evidence of pericardial effusion. Mitral Valve: The mitral valve is grossly normal. Trivial mitral valve regurgitation. Tricuspid Valve: The tricuspid valve is normal in structure. Tricuspid valve regurgitation is trivial. Aortic Valve: The  aortic valve is normal in structure. Aortic valve regurgitation is mild. No aortic stenosis is present. Aortic valve mean gradient measures 3.0 mmHg. Aortic valve peak gradient measures 7.7 mmHg. Aortic valve area, by VTI measures 3.32 cm. Pulmonic Valve: The pulmonic valve was grossly normal. Pulmonic valve regurgitation is mild. Aorta: The aortic root and ascending aorta are structurally normal, with no evidence of dilitation. There is mild (Grade II) atheroma plaque involving the transverse aorta. IAS/Shunts: No atrial level shunt detected by color flow Doppler. Agitated saline contrast was given intravenously to evaluate for intracardiac shunting. Agitated saline contrast bubble study was positive with shunting observed after >6 cardiac cycles suggestive of intrapulmonary shunting.  LEFT VENTRICLE PLAX 2D LVOT diam:     2.30 cm LV SV:         65 LV SV Index:   38 LVOT Area:     4.15 cm  AORTIC VALVE AV Area (Vmax):    2.81 cm AV Area (Vmean):   3.55 cm AV Area (VTI):     3.32 cm AV Vmax:           139.00 cm/s AV Vmean:          78.000 cm/s AV VTI:            0.195 m AV Peak Grad:      7.7 mmHg AV Mean Grad:      3.0 mmHg LVOT Vmax:         94.10 cm/s LVOT Vmean:        66.700 cm/s LVOT VTI:          0.156 m LVOT/AV VTI ratio: 0.80  AORTA Ao Root diam: 3.20 cm Ao Asc diam:  3.60 cm  SHUNTS Systemic VTI:  0.16 m Systemic Diam: 2.30 cm Cherlynn Kaiser MD Electronically signed by Cherlynn Kaiser MD Signature Date/Time: 11/20/2020/5:03:27 AM    Final (Updated)    VAS [place] LOWER EXTREMITY VENOUS (DVT)  Result Date: 11/18/2020  Lower Venous DVT Study Indications: Stroke.  Comparison Study: no prior Performing Technologist: Abram Sander RVS  Examination Guidelines: A complete evaluation includes B-mode imaging, spectral Doppler, color Doppler, and power Doppler as needed of all accessible portions of each vessel. Bilateral testing is considered an integral part of  a complete examination. Limited examinations for  reoccurring indications may be performed as noted. The reflux portion of the exam is performed with the patient in reverse Trendelenburg.  +---------+---------------+---------+-----------+----------+--------------+ RIGHT    CompressibilityPhasicitySpontaneityPropertiesThrombus Aging +---------+---------------+---------+-----------+----------+--------------+ CFV      Full           Yes      Yes                                 +---------+---------------+---------+-----------+----------+--------------+ SFJ      Full                                                        +---------+---------------+---------+-----------+----------+--------------+ FV Prox  Full                                                        +---------+---------------+---------+-----------+----------+--------------+ FV Mid   Full                                                        +---------+---------------+---------+-----------+----------+--------------+ FV DistalFull                                                        +---------+---------------+---------+-----------+----------+--------------+ PFV      Full                                                        +---------+---------------+---------+-----------+----------+--------------+ POP      Full           Yes      Yes                                 +---------+---------------+---------+-----------+----------+--------------+ PTV      Full                                                        +---------+---------------+---------+-----------+----------+--------------+ PERO     Full                                                        +---------+---------------+---------+-----------+----------+--------------+   +---------+---------------+---------+-----------+----------+--------------+ LEFT     CompressibilityPhasicitySpontaneityPropertiesThrombus Aging  +---------+---------------+---------+-----------+----------+--------------+ CFV      Full  Yes      Yes                                 +---------+---------------+---------+-----------+----------+--------------+ SFJ      Full                                                        +---------+---------------+---------+-----------+----------+--------------+ FV Prox  Full                                                        +---------+---------------+---------+-----------+----------+--------------+ FV Mid   Full                                                        +---------+---------------+---------+-----------+----------+--------------+ FV DistalFull                                                        +---------+---------------+---------+-----------+----------+--------------+ PFV      Full                                                        +---------+---------------+---------+-----------+----------+--------------+ POP      Full           Yes      Yes                                 +---------+---------------+---------+-----------+----------+--------------+ PTV      Full                                                        +---------+---------------+---------+-----------+----------+--------------+ PERO     Full                                                        +---------+---------------+---------+-----------+----------+--------------+     Summary: BILATERAL: - No evidence of deep vein thrombosis seen in the lower extremities, bilaterally. - No evidence of superficial venous thrombosis in the lower extremities, bilaterally. -No evidence of popliteal cyst, bilaterally.   *See table(s) above for measurements and observations. Electronically signed by Jamelle Haring on 11/18/2020 at 5:16:27 PM.    Final    ECHOCARDIOGRAM LIMITED  Result Date: 11/17/2020    ECHOCARDIOGRAM  LIMITED REPORT   Patient Name:   [name] Date of  Exam: 11/17/2020 Medical Rec #:  [id number]          Height:       67.0 in Accession #:    [id number]         Weight:       134.0 lb Date of Birth:  [birth year]          BSA:          1.706 m Patient Age:    17 years           BP:           163/89 mmHg Patient Gender: M                  HR:           74 bpm. Exam Location:  Inpatient Procedure: Limited Echo, Limited Color Doppler and Cardiac Doppler Indications:    Elevated Troponin  History:        Patient has no prior history of Echocardiogram examinations.                 Risk Factors:Hypertension and ETOH.  Sonographer:    Bernadene Person RDCS Referring Phys: [id number] [place]  1. Left ventricular ejection fraction, by estimation, is 45 to 50%. The left ventricle has mildly decreased function. The left ventricle demonstrates regional wall motion abnormalities (see scoring diagram/findings for description). There is hypokinesis  of the anterior and anteroseptal LV wall segments. There is moderate concentric left ventricular hypertrophy. Left ventricular diastolic parameters are consistent with Grade I diastolic dysfunction (impaired relaxation).  2. Right ventricular systolic function is normal. The right ventricular size is normal. There is normal pulmonary artery systolic pressure.  3. Moderate pleural effusion in the left lateral region.  4. The mitral valve is normal in structure. Trivial mitral valve regurgitation.  5. The aortic valve is tricuspid. There is mild calcification of the aortic valve. There is mild thickening of the aortic valve. Aortic valve regurgitation is mild. Mild aortic valve sclerosis is present, with no evidence of aortic valve stenosis.  6. The inferior vena cava is normal in size with greater than 50% respiratory variability, suggesting right atrial pressure of 3 mmHg.  7. There is mildly reduced LVEF with anterior and anteroseptal wall motion abnormalities concerning for possible LAD lesion. . Comparison(s): No prior  Echocardiogram. FINDINGS  Left Ventricle: Left ventricular ejection fraction, by estimation, is 45 to 50%. The left ventricle has mildly decreased function. The left ventricle demonstrates regional wall motion abnormalities. There is hypokinesis of the anterior and anteroseptal LV wall segments. The left ventricular internal cavity size was normal in size. There is moderate concentric left ventricular hypertrophy. Left ventricular diastolic parameters are consistent with Grade I diastolic dysfunction (impaired relaxation). Normal left ventricular filling pressure. Right Ventricle: The right ventricular size is normal. No increase in right ventricular wall thickness. Right ventricular systolic function is normal. There is normal pulmonary artery systolic pressure. The tricuspid regurgitant velocity is 2.17 m/s, and  with an assumed right atrial pressure of 3 mmHg, the estimated right ventricular systolic pressure is 123XX123 mmHg. Pericardium: There is no evidence of pericardial effusion. Mitral Valve: The mitral valve is normal in structure. There is mild thickening of the mitral valve leaflet(s). Mild mitral annular calcification. Trivial mitral valve regurgitation. Tricuspid Valve: The tricuspid valve is normal in structure. Tricuspid valve regurgitation is trivial. Aortic Valve: The aortic  valve is tricuspid. There is mild calcification of the aortic valve. There is mild thickening of the aortic valve. Aortic valve regurgitation is mild. Aortic regurgitation PHT measures 569 msec. Mild aortic valve sclerosis is present, with no evidence of aortic valve stenosis. Pulmonic Valve: The pulmonic valve was not well visualized. Pulmonic valve regurgitation is not visualized. Venous: The inferior vena cava is normal in size with greater than 50% respiratory variability, suggesting right atrial pressure of 3 mmHg. Additional Comments: There is a moderate pleural effusion in the left lateral region. LEFT VENTRICLE PLAX 2D  LVIDd:         4.40 cm     Diastology LVIDs:         3.40 cm     LV e' medial:    7.76 cm/s LV PW:         1.30 cm     LV E/e' medial:  6.4 LV IVS:        1.10 cm     LV e' lateral:   10.00 cm/s LVOT diam:     2.20 cm     LV E/e' lateral: 4.9 LV SV:         63 LV SV Index:   37 LVOT Area:     3.80 cm  LV Volumes (MOD) LV vol d, MOD A2C: 64.6 ml LV vol d, MOD A4C: 71.3 ml LV vol s, MOD A2C: 31.4 ml LV vol s, MOD A4C: 36.3 ml LV SV MOD A2C:     33.2 ml LV SV MOD A4C:     71.3 ml LV SV MOD BP:      33.7 ml RIGHT VENTRICLE RV S prime:     11.70 cm/s TAPSE (M-mode): 2.1 cm LEFT ATRIUM         Index LA diam:    2.30 cm 1.35 cm/m  AORTIC VALVE LVOT Vmax:   90.10 cm/s LVOT Vmean:  54.100 cm/s LVOT VTI:    0.167 m AI PHT:      569 msec  AORTA Ao Root diam: 3.20 cm MITRAL VALVE               TRICUSPID VALVE MV Area (PHT): 3.27 cm    TR Peak grad:   18.8 mmHg MV Decel Time: 232 msec    TR Vmax:        217.00 cm/s MV E velocity: 49.30 cm/s MV A velocity: 53.60 cm/s  SHUNTS MV E/A ratio:  0.92        Systemic VTI:  0.17 m                            Systemic Diam: 2.20 cm Gwyndolyn Kaufman MD Electronically signed by Gwyndolyn Kaufman MD Signature Date/Time: 11/17/2020/10:24:55 AM    Final    MR ANGIO HEAD WO W CONTRAST  Result Date: 11/19/2020 CLINICAL DATA:  Stroke, follow-up. EXAM: MRA HEAD WITHOUT CONTRAST TECHNIQUE: Angiographic images of the Circle of Willis were obtained using MRA technique without intravenous contrast. COMPARISON:  Brain MRI 11/16/2020.  Head CT 11/16/2020. FINDINGS: Moderate to moderately severe motion degradation, significantly limiting evaluation. The intracranial internal carotid arteries are patent. The M1 middle cerebral arteries are patent. No M2 proximal branch occlusion is identified. The anterior cerebral arteries are patent. Hypoplastic right A1 segment. Within described limitations, no severe proximal anterior circulation arterial stenosis is identified. The intracranial vertebral  arteries are patent. The basilar artery is patent. The posterior  cerebral arteries are patent. Within described limitations, no severe proximal posterior circulation arterial stenosis is identified. 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 IMPRESSION: The examination is motion degraded to a moderate to moderately severe degree, significantly limiting evaluation for arterial stenoses. Within this limitation, no large vessel occlusion or severe proximal arterial stenosis is identified. 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 Electronically Signed   By: Kellie Simmering DO   On: 11/19/2020 10:19   CT Angio Abd/Pel w/ and/or w/o  Result Date: 11/27/2020 CLINICAL DATA:  GI bleed EXAM: CTA ABDOMEN AND PELVIS WITHOUT AND WITH CONTRAST TECHNIQUE: Multidetector CT imaging of the abdomen and pelvis was performed using the standard protocol during bolus administration of intravenous contrast. Multiplanar reconstructed images and MIPs were obtained and reviewed to evaluate the vascular anatomy. CONTRAST:  189mL OMNIPAQUE IOHEXOL 350 MG/ML SOLN COMPARISON:  None. FINDINGS: VASCULAR Aorta: Mixed density aortic atherosclerosis without aneurysm, stenosis or dissection. Celiac: Patent without evidence of aneurysm, dissection, vasculitis or significant stenosis. SMA: Patent without evidence of aneurysm, dissection, vasculitis or significant stenosis. Renals: Both renal arteries are patent without evidence of aneurysm, dissection, vasculitis, fibromuscular dysplasia or significant stenosis. IMA: Patent without evidence of aneurysm, dissection, vasculitis or significant stenosis. Inflow: Moderate mixed density atherosclerosis of the proximal left internal iliac artery. Proximal Outflow: Bilateral common femoral and visualized portions of the superficial and profunda femoral arteries are patent without evidence of aneurysm, dissection, vasculitis  or significant stenosis. Veins: No obvious venous abnormality within the limitations of this arterial phase study. Review of the MIP images confirms the above findings. NON-VASCULAR Lower chest: Emphysema Hepatobiliary: No focal liver abnormality is seen. No gallstones, gallbladder wall thickening, or biliary dilatation. Pancreas: Unremarkable. No pancreatic ductal dilatation or surrounding inflammatory changes. Spleen: Normal in size without focal abnormality. Adrenals/Urinary Tract: Adrenal glands are unremarkable. Kidneys are normal, without renal calculi, focal lesion, or hydronephrosis. Thick-walled urinary bladder with Foley catheter. Stomach/Bowel: There is intraluminal high density material interspersed with fluid in the distal rectum. This may arise from 1 of the small perirectal perforating vessels. No other abnormal density. No small bowel obstruction. Lymphatic: No enlarged lymph nodes. Reproductive: Prostate is unremarkable. Other: No abdominal wall hernia or abnormality. No abdominopelvic ascites. Musculoskeletal: No acute or significant osseous findings. IMPRESSION: 1. Intraluminal high density material interspersed with fluid in the distal rectum, possibly arising from a small perirectal perforating vessels. This could indicate active hemorrhage or angiodysplasia. 2. No other acute findings. These results will be called to the ordering clinician or representative by the Radiologist Assistant, and communication documented in the PACS or [employer]. Aortic Atherosclerosis (ICD10-I70.0) and Emphysema (ICD10-J43.9). Electronically Signed   By: Ulyses Jarred M.D.   On: 11/27/2020 02:01    Labs:  CBC: Recent Labs    11/24/20 0236 11/25/20 0025 11/26/20 0345 11/26/20 2040 11/26/20 2328 11/27/20 0149 11/27/20 0347  WBC 11.4* 10.6* 11.4*  --   --   --  26.8*  HGB 11.6* 12.4* 12.3* 11.2* 9.5*  --  10.0*  HCT 35.8* 38.7* 38.2* 34.8* 30.3*  --  30.3*  PLT 380 437* 565*  --   --  413* 441*     COAGS: Recent Labs    11/19/20 0236 11/20/20 0454 11/21/20 0332 11/27/20 0149  INR 1.1 1.0 1.0 1.1  APTT  --   --   --  28    BMP: Recent Labs    02/24/20 1114  02/24/20 1114 04/21/20 1029 06/02/20 1015 08/09/20 1029 08/24/20 1726 11/24/20 0236 11/25/20 0025 11/26/20 0345 11/27/20 0348  NA 141  --  139   < > 143   < > 137 135 138 134*  K 3.9  --  3.8   < > 3.9   < > 3.6 3.9 3.8 4.7  CL 106  --  105   < > 108   < > 108 106 104 105  CO2 23  --  27   < > 28   < > 21* 22 24 20*  GLUCOSE 103*  --  89   < > 96   < > 78 84 87 98  BUN 14  --  18   < > 12   < > 13 12 13 22   CALCIUM 9.1  --  9.0   < > 9.6   < > 7.8* 8.0* 8.3* 7.7*  CREATININE 1.05   < > 0.87   < > 1.02   < > 0.84 0.83 0.82 1.12  GFRNONAA >60   < > 91   < > 77   < > >60 >60 >60 >60  GFRAA >60  --  105  --  89  --   --   --   --   --    < > = values in this interval not displayed.    LIVER FUNCTION TESTS: Recent Labs    11/16/20 1135 11/17/20 0134 11/18/20 0112 11/19/20 0236  BILITOT 1.1 1.1 0.7 0.6  AST 254* 213* 141* 102*  ALT 113* 94* 74* 65*  ALKPHOS 75 51 48 50  PROT 8.6* 6.5 6.1* 5.9*  ALBUMIN 3.3* 2.4* 2.0* 1.9*    TUMOR MARKERS: No results for input(s): AFPTM, CEA, CA199, CHROMGRNA in the last 8760 hours.  Assessment and Plan:  65 year old gentleman with history of rectal malignancy and acute GI bleed resulting in hypotension.  IR consulted for angiogram and possible embolization.  CTA shows active extravasation at the level of the rectum.  We will perform mesenteric and possible internal iliac artery angiogram and embolize any active hemorrhage.  The plan of the procedure was discussed with the patient's sister, Deboorah Rideout and consent was obtained.  The patient is not currently consentable.   Risks and benefits of mesenteric angiogram and embolization were discussed with the patient's sister including, but not limited to bleeding, infection, bowel necrosis requiring surgical  intervention, vascular injury or contrast induced renal failure.  This interventional procedure involves the use of X-rays and because of the nature of the planned procedure, it is possible that we will have prolonged use of X-ray fluoroscopy.  Potential radiation risks to you include (but are not limited to) the following: - A slightly elevated risk for cancer  several years later in life. This risk is typically less than 0.5% percent. This risk is low in comparison to the normal incidence of human cancer, which is 33% for women and 50% for men according to the [employer].  - Radiation induced injury can include skin redness, resembling a rash, tissue breakdown / ulcers and hair loss (which can be temporary or permanent).   The likelihood of either of these occurring depends on the difficulty of the procedure and whether you are sensitive to radiation due to previous procedures, disease, or genetic conditions.   IF your procedure requires a prolonged use of radiation, you will be notified and given written instructions for further action.  It  is your responsibility to monitor the irradiated area for the 2 weeks following the procedure and to notify your physician if you are concerned that you have suffered a radiation induced injury.    All of the patient's questions were answered, patient is agreeable to proceed.  Consent signed and in chart.  Thank you for this interesting consult.  I greatly enjoyed meeting [name] and look forward to participating in their care.  A copy of this report was sent to the requesting provider on this date.  Electronically Signed: Paula Libra Chazlyn Cude, MD 11/27/2020, 6:11 AM   I spent a total of 20 Minutes in face to face in clinical consultation, greater than 50% of which was counseling/coordinating care for angiogram and possible embolization of rectal bleed.

## 2020-11-27 NOTE — Progress Notes (Signed)
eLink Physician-Brief Progress Note Patient Name: Tania Perrott DOB: 1956/01/16 MRN: [id number]   Date of Service  11/27/2020  HPI/Events of Note  Called by radiology with findings of Abdominal/Pelvis CTA: 1. Intraluminal high density material interspersed with fluid in the distal rectum, possibly arising from a small perirectal perforating vessels. This could indicate active hemorrhage or angiodysplasia. 2. No other acute findings.  eICU Interventions  The PCCM ground team is aware of these findings. Further management per PCCM ground team.      Intervention Category Major Interventions: Other:  Jalessa Peyser Cornelia Copa 11/27/2020, 2:20 AM

## 2020-11-27 NOTE — Progress Notes (Deleted)
Patient continues to have bright red blood per rectum with bowel movements, patient responding well to IV fluids, but once stopped, blood pressure drops. Discussed with GI who recommend transfusing and direct pressure as needed to stop bleeding as well as CT angiogram. Do not believe patient is candidate for procedure at this time after having received dose of plavix and aspirin today for his acute/sub-acute CVA  Despite IV fluid, patient started to have persistent hypotension with MAP under 65. Ordered additional bolus of IV fluids and released blood products. Patient to receive 2 units. Patient's MAP remained below 65. PCCM consulted, will see patient. Appreciate their assistance in [name]'s case.

## 2020-11-27 NOTE — Consult Note (Addendum)
NAME:  [name], MRN:  [id number], DOB:  Mar 12, 1956, LOS: 6 ADMISSION DATE:  11/16/2020, CONSULTATION DATE:  11/26/20 REFERRING MD:  Graciella Freer, CHIEF COMPLAINT:  Hypotensive   History of Present Illness:  65 yo M PMH anal squamous cell carcinoma, HIV, HCV, HTN, HLD, polysubstance abuse who presented to ED 4/13 with AMS. Found to have scattered acute/subacute infarcts for which neurology was consulted. ID consulted 4/13 as well to evaluate for possible infectious causes of encephalopathy, without infectious etiology. Continuing to follow for bullous lesions, HCV, HIV. During this hospitalization it was found that the pt has new HFrEF (45%).   Developed GIB 4/21. CCS csonulted and rec GI consult --saw pt 4/22. Later 4/22 pt with continued bleeding and new hypotension.   PCCM consulted in this setting  Pertinent  Medical History  Anal squamous cell carcinoma  HIV, AIDS HCV HTN HLD Tobacco use    Significant Hospital Events: Including procedures, antibiotic start and stop dates in addition to other pertinent events   4/13 admitted to IMTS for AMS  4/14 cards consult for TEE  4/21- [facility] consult for GIB, rec GI 4/22 GI consulted. Later hypotensive, hgb stable. PCCM consulted. 4/23 VIR angiogram without target, no intervention, GI considering flex sig  Interim History / Subjective:  Remains on low dose NE, VIR without target for embo. Discussed with GI, plan possible flex sig.  Objective   Blood pressure (!) 93/57, pulse 85, temperature (!) 97.5 F (36.4 C), temperature source Oral, resp. rate (!) 26, height 5\' 7"  (1.702 m), weight 60.7 kg, SpO2 100 %.        Intake/Output Summary (Last 24 hours) at 11/27/2020 1022 Last data filed at 11/27/2020 1016 Gross per 24 hour  Intake 2569.35 ml  Output 2135 ml  Net 434.35 ml   Filed Weights   11/25/20 0341 11/26/20 0500 11/27/20 0200  Weight: 57.2 kg 58.9 kg 60.7 kg    Examination: General: Chronically and acutely ill appearing  thin frail M, appears older than stated age.  HENT: [place]. Pink mm. Trachea midline Lungs: Symmetrical chest expansion, even, unlabored on RA.  Cardiovascular: tachycardic, regular rhythm s1s2 cap refill < 3 sec Abdomen: Mildly distended, soft, non-tender Extremities: No acute joint deformity, no cyanosis or clubbing.  Symmetrically decreased bulk  Neuro: Awake, lethargic. Moving arms spontaneously, does not follow commands GU: Yellow urine   Labs/imaging that I havepersonally reviewed  (right click and "Reselect all SmartList Selections" daily)  CBC, CXR, CT, chemietries  Resolved Hospital Problem list     Assessment & Plan:   Acute encephalopathy in setting of stroke Stroke- multifocal bilateral infarcts Now non-verbal, worry abotu exopansion/hemorrhagic conversion of larger infarct P -Hold ASA/plavix in setting of bleeding  -follow neuro exam -CT head, coordinate with flex sig  Hemorrhagic Shock Bleeding from rectum -- possibly r/t radiation from anal squamous cell carcinoma: radiation proctitis  P -s/p angiogram with VIR without target, no intervention -GI following, possible flex sig -NE, MAP > 65 -NS bolus 1L  HIV HCV Bullous skin lesions, aphthous ulcers  P -per ID -switching to biktarvy   Hx Anal squamous cell carcinoma s/p excision, chemo, radiation Fecal incontinence - post excision  P -supportive care   Best practice (right click and "Reselect all SmartList Selections" daily)  Diet:  NPO Pain/Anxiety/Delirium protocol (if indicated): No VAP protocol (if indicated): Not indicated DVT prophylaxis: SCD GI prophylaxis: PPI Glucose control:  SSI No Central venous access:  N/A Arterial line:  N/A Foley:  N/A Mobility:  bed rest  PT consulted: Yes Last date of multidisciplinary goals of care discussion [pending] Code Status:  full code Disposition: ICU   Critical care time:     CRITICAL CARE Performed by: Lanier Clam   Total critical care  time: 40 minutes  Critical care time was exclusive of separately billable procedures and treating other patients.  Critical care was necessary to treat or prevent imminent or life-threatening deterioration.  Critical care was time spent personally by me on the following activities: development of treatment plan with patient and/or surrogate as well as nursing, discussions with consultants, evaluation of patient's response to treatment, examination of patient, obtaining history from patient or surrogate, ordering and performing treatments and interventions, ordering and review of laboratory studies, ordering and review of radiographic studies, pulse oximetry and re-evaluation of patient's condition.  Lanier Clam MD See Amion for contact info 11/27/2020, 10:22 AM

## 2020-11-27 NOTE — Progress Notes (Signed)
Paged Dr. Silverio Decamp to verify ok to give Mesalamine supp... Per day shift RN, VO given not to insert anything via rectal d/t rectal bleed  Per Dr. Silverio Decamp, ok to give Mesalamine supp.

## 2020-11-27 NOTE — Progress Notes (Signed)
Transported to IR w/o complications.

## 2020-11-27 NOTE — H&P (View-Only) (Signed)
We are planning on a flexible sigmoidoscopy today. I was unable to reach the brother to get consent. I left a VM, will call him again in a little while.   9:15am Tried to reach brother [name] again on mobile phone. Also called the listed "home" number which is for [facility]. .  I have been in contact with patient's nurse on 51M. She is waiting to remove flexiseal and given enemas until we know for sure that flex sig will be done today.    10:50am Spoke with sister Neoma Laming and she gave consent for flex sig.   Attending physician's note   I have taken an interval history, reviewed the chart and examined the patient. I agree with the Advanced Practitioner's note, impression and recommendations.    Given significant rectal bleeding, causing hypotension and transfer to ICU, will have to proceed with flex sig urgently to intervene and stop the rectal bleeding. Tap water enema X2 Plan for flex sig with APC and possible intervention NPO     K. Denzil Magnuson , MD [phone]

## 2020-11-27 NOTE — Plan of Care (Signed)
  Problem: Education: Goal: Knowledge of General Education information will improve Description: Including pain rating scale, medication(s)/side effects and non-pharmacologic comfort measures Outcome: Progressing   Problem: Health Behavior/Discharge Planning: Goal: Ability to manage health-related needs will improve Outcome: Progressing   Problem: Clinical Measurements: Goal: Ability to maintain clinical measurements within normal limits will improve Outcome: Not Progressing Goal: Will remain free from infection Outcome: Progressing Goal: Diagnostic test results will improve Outcome: Progressing Goal: Respiratory complications will improve Outcome: Progressing Goal: Cardiovascular complication will be avoided Outcome: Not Progressing   Problem: Activity: Goal: Risk for activity intolerance will decrease Outcome: Not Progressing   Problem: Nutrition: Goal: Adequate nutrition will be maintained Outcome: Not Progressing

## 2020-11-27 NOTE — Op Note (Addendum)
[facility] Patient Name: [name] Procedure Date : 11/27/2020 MRN: [id number] Attending MD: Mauri Pole , MD Date of Birth: Jun 16, 1956 CSN: [id number] Age: 65 Admit Type: Inpatient Procedure:                Flexible Sigmoidoscopy Indications:              Rectal hemorrhage, Hematochezia Providers:                Mauri Pole, MD, Elmer Ramp. Tilden Dome, RN,                            Laverda Sorenson, Technician Referring MD:              Medicines:                None Complications:            No immediate complications. Estimated Blood Loss:     Estimated blood loss was minimal. Procedure:                Pre-Anesthesia Assessment:                           - Prior to the procedure, a History and Physical                            was performed, and patient medications and                            allergies were reviewed. The patient's tolerance of                            previous anesthesia was also reviewed. The risks                            and benefits of the procedure and the sedation                            options and risks were discussed with the patient.                            All questions were answered, and informed consent                            was obtained. Prior Anticoagulants: The patient                            last took Plavix (clopidogrel) 1 day prior to the                            procedure. ASA Grade Assessment: IV - A patient                            with severe systemic disease that is a constant  threat to life. After reviewing the risks and                            benefits, the patient was deemed in satisfactory                            condition to undergo the procedure.                           After obtaining informed consent, the scope was                            passed under direct vision. The GIF-H190 ([id number])                            Olympus gastroscope was  introduced through the anus                            and advanced to the the sigmoid colon. The flexible                            sigmoidoscopy was accomplished without difficulty.                            The patient tolerated the procedure well. The                            quality of the bowel preparation was adequate. Scope In: Scope Out: Findings:      The perianal exam findings include a decubitus ulceration and a perianal       rash.      Multiple large cratered ulcers with eschar, necrotic appearing were       found in the rectum and rectosigmoid. Adherent clot in the distal rectum       near anal verge, unable to dislodge with lavage or suction to visualize       whats underneath the clot. Stigmata of recent bleeding were present.       Biopsies were taken surrounding mucosa with a cold forceps for       histology, rule out recurrent anal cancer. No other lesion for       endoscopic intervention Impression:               - Decubitus ulceration and perianal rash found on                            perianal exam.                           - Multiple ulcers in the rectum. Biopsied. Recommendation:           - Await pathology results.                           - Consult colorectal surgery if has reurrent large  volume hematochezia                           - Continue supportive care                           - Avoid rectal tube or flexiseal                           - GI is available if needed, but no plan for any                            further endoscopic intervention. Please call with                            any questions.                           - Palliative care to discuss goals of care Procedure Code(s):        --- Professional ---                           [phone], Sigmoidoscopy, flexible; with biopsy, single                            or multiple Diagnosis Code(s):        --- Professional ---                           A21.308,  Pressure ulcer of other site, unspecified                            stage                           R21, Rash and other nonspecific skin eruption                           K62.6, Ulcer of anus and rectum                           K62.5, Hemorrhage of anus and rectum                           K92.1, Melena (includes Hematochezia) CPT copyright 2019 [employer]. All rights reserved. The codes documented in this report are preliminary and upon coder review may  be revised to meet current compliance requirements. Mauri Pole, MD 11/27/2020 12:50:34 PM This report has been signed electronically. Number of Addenda: 0

## 2020-11-27 NOTE — Progress Notes (Signed)
eLink Physician-Brief Progress Note Patient Name: [name] DOB: 1955/11/02 MRN: [id number]   Date of Service  11/27/2020  HPI/Events of Note  Hypotension - BP = 74/51. Nursing relates that the patient continues to GI bleed. Labs pending for re-evaluation of Hgb.   eICU Interventions  Plan: 1. Phenylephrine IV infusion via PIV. Titrate to MAP >= 65. 2. Await repeat Hgb. 3. Will request PCCM ground team to re-evaluate the patient at bedside.      Intervention Category Major Interventions: Hypotension - evaluation and management  Loletha Bertini Eugene 11/27/2020, 4:08 AM

## 2020-11-27 NOTE — Progress Notes (Addendum)
We are planning on a flexible sigmoidoscopy today. I was unable to reach the brother to get consent. I left a VM, will call him again in a little while.   9:15am Tried to reach brother Herman again on mobile phone. Also called the listed "home" number which is for [facility]. .  I have been in contact with patient's nurse on 2M. She is waiting to remove flexiseal and given enemas until we know for sure that flex sig will be done today.    10:50am Spoke with sister Deborah and she gave consent for flex sig.   Attending physician's note   I have taken an interval history, reviewed the chart and examined the patient. I agree with the Advanced Practitioner's note, impression and recommendations.    Given significant rectal bleeding, causing hypotension and transfer to ICU, will have to proceed with flex sig urgently to intervene and stop the rectal bleeding. Tap water enema X2 Plan for flex sig with APC and possible intervention NPO     K. Veena Nandigam , MD [phone]    

## 2020-11-27 NOTE — Procedures (Signed)
Interventional Radiology Procedure Note  Procedure: Inferior mesenteric and bilateral internal iliac angiogram.    Indication: Rectal bleed  Findings:  No active extravasation identified.   No embolization was performed.  Please refer to procedural dictation for full description.  Complications: None  EBL: < 10 mL  Miachel Roux, MD [phone]

## 2020-11-28 DIAGNOSIS — G931 Anoxic brain damage, not elsewhere classified: Secondary | ICD-10-CM | POA: Diagnosis not present

## 2020-11-28 LAB — CBC WITH DIFFERENTIAL/PLATELET
Abs Immature Granulocytes: 0.53 10*3/uL — ABNORMAL HIGH (ref 0.00–0.07)
Basophils Absolute: 0.1 10*3/uL (ref 0.0–0.1)
Basophils Relative: 1 %
Eosinophils Absolute: 0.2 10*3/uL (ref 0.0–0.5)
Eosinophils Relative: 1 %
HCT: 25.8 % — ABNORMAL LOW (ref 39.0–52.0)
Hemoglobin: 8.7 g/dL — ABNORMAL LOW (ref 13.0–17.0)
Immature Granulocytes: 4 %
Lymphocytes Relative: 12 %
Lymphs Abs: 1.8 10*3/uL (ref 0.7–4.0)
MCH: 29.2 pg (ref 26.0–34.0)
MCHC: 33.7 g/dL (ref 30.0–36.0)
MCV: 86.6 fL (ref 80.0–100.0)
Monocytes Absolute: 1.4 10*3/uL — ABNORMAL HIGH (ref 0.1–1.0)
Monocytes Relative: 10 %
Neutro Abs: 10.3 10*3/uL — ABNORMAL HIGH (ref 1.7–7.7)
Neutrophils Relative %: 72 %
Platelets: 469 10*3/uL — ABNORMAL HIGH (ref 150–400)
RBC: 2.98 MIL/uL — ABNORMAL LOW (ref 4.22–5.81)
RDW: 16 % — ABNORMAL HIGH (ref 11.5–15.5)
WBC: 14.3 10*3/uL — ABNORMAL HIGH (ref 4.0–10.5)
nRBC: 0 % (ref 0.0–0.2)

## 2020-11-28 LAB — HEMOGLOBIN AND HEMATOCRIT, BLOOD
HCT: 25 % — ABNORMAL LOW (ref 39.0–52.0)
HCT: 23.6 % — ABNORMAL LOW (ref 39.0–52.0)
Hemoglobin: 8.3 g/dL — ABNORMAL LOW (ref 13.0–17.0)
Hemoglobin: 7.9 g/dL — ABNORMAL LOW (ref 13.0–17.0)

## 2020-11-28 LAB — BASIC METABOLIC PANEL
Anion gap: 7 (ref 5–15)
BUN: 24 mg/dL — ABNORMAL HIGH (ref 8–23)
CO2: 23 mmol/L (ref 22–32)
Calcium: 7.8 mg/dL — ABNORMAL LOW (ref 8.9–10.3)
Chloride: 106 mmol/L (ref 98–111)
Creatinine, Ser: 1.02 mg/dL (ref 0.61–1.24)
GFR, Estimated: >60 mL/min (ref >60)
Glucose, Bld: 92 mg/dL (ref 70–99)
Potassium: 4 mmol/L (ref 3.5–5.1)
Sodium: 136 mmol/L (ref 135–145)

## 2020-11-28 LAB — GLUCOSE, CAPILLARY
Glucose-Capillary: 92 mg/dL (ref 70–99)
Glucose-Capillary: 82 mg/dL (ref 70–99)
Glucose-Capillary: 94 mg/dL (ref 70–99)
Glucose-Capillary: 113 mg/dL — ABNORMAL HIGH (ref 70–99)
Glucose-Capillary: 128 mg/dL — ABNORMAL HIGH (ref 70–99)

## 2020-11-28 LAB — MRSA PCR SCREENING: MRSA by PCR: NEGATIVE

## 2020-11-28 MED ORDER — LACTATED RINGERS IV BOLUS
1000.0000 mL | Freq: Once | INTRAVENOUS | Status: AC
  Administered 2020-11-28: 1000 mL via INTRAVENOUS

## 2020-11-28 NOTE — Progress Notes (Signed)
Supervising Physician: Arne Cleveland  Patient Status:  [facility] - In-pt  Chief Complaint: GI bleed  Subjective: Patient lethargic, not conversant during visit today.  Vital signs stable.  Hgb 8.7  Allergies: Patient has no known allergies.  Medications: Prior to Admission medications   Medication Sig Start Date End Date Taking? Authorizing Provider  bictegravir-emtricitabine-tenofovir AF (BIKTARVY) 50-200-25 MG TABS tablet Take 1 tablet by mouth daily. 11/25/20   Velna Ochs, MD  clotrimazole (LOTRIMIN) 1 % cream Apply to affected area 2 times daily 10/06/19   Faustino Congress, NP  Darunavir-Cobicisctat-Emtricitabine-Tenofovir Alafenamide [facility]) 800-150-200-10 MG TABS Take 1 tablet by mouth daily with breakfast. 04/21/20   [name], [name], MD  HYDROcodone-acetaminophen (NORCO/VICODIN) 5-325 MG tablet TAKE 1 TABLET BY MOUTH EVERY 8 HOURS AS NEEDED FOR UP TO 5 DAYS FOR SEVERE PAIN NOT IMPROVED BY YOUR SCHEDULED ACETAMINOPHEN -NO MORE THAN 4000MG  PER DAY 08/25/20 02/21/21  Cardama, Grayce Sessions, MD  mupirocin ointment (BACTROBAN) 2 % APPLY TOPICALLY 2 (TWO) TIMES DAILY FOR 10 DAYS. APPLY TO HEAD OF PENIS 08/25/20 08/25/21  Cardama, Grayce Sessions, MD  omeprazole (PRILOSEC) 40 MG capsule Take 1 capsule (40 mg total) by mouth daily. 08/04/19   Golden Circle, FNP  oxyCODONE-acetaminophen (PERCOCET/ROXICET) 5-325 MG tablet Take 2 tablets by mouth every 6 (six) hours as needed for severe pain. 10/23/20   Noemi Chapel, MD  oxyCODONE-acetaminophen (PERCOCET/ROXICET) 5-325 MG tablet TAKE 2 TABLETS BY MOUTH EVERY 6 HOURS AS NEEDED FOR SEVERE PAIN. 10/23/20 04/21/21  Domenic Moras, PA-C     Vital Signs: BP (!) 116/57   Pulse 95   Temp 98.3 F (36.8 C) (Axillary)   Resp (!) 28   Ht 5\' 7"  (1.702 m)   Wt 121 lb 0.5 oz (54.9 kg)   SpO2 96%   BMI 18.96 kg/m   Physical Exam  NAD, lethargic, non-conversant Groin: site intact.  No oozing, no evidence of hematoma or pseudoaneurysm.    Imaging: IR Angiogram Visceral Selective  Result Date: 11/27/2020 INDICATION: 65 year old gentleman with acute lower GI bleed and hypotension requiring pressors presents today measure radiology for angiogram and possible embolization. CT angiography demonstrates acute hemorrhage in the rectum. EXAM: 1. Ultrasound-guided access of right common femoral artery 2. Bilateral internal iliac angiogram 3. Inferior mesenteric angiogram MEDICATIONS: Ancef 2 g IV. The antibiotic was administered within 1 hour of the procedure ANESTHESIA/SEDATION: None CONTRAST:  Under in 10 mL of Omnipaque 300 FLUOROSCOPY TIME:  Fluoroscopy Time: 17 minutes 24 seconds (or 327 mGy). COMPLICATIONS: None immediate. PROCEDURE: Informed consent was obtained from the patient's sister following explanation of the procedure, risks, benefits and alternatives. The patient understands, agrees and consents for the procedure. All questions were addressed. A time out was performed prior to the initiation of the procedure. Maximal barrier sterile technique utilized including caps, mask, sterile gowns, sterile gloves, large sterile drape, hand hygiene, and chlorhexidine prep. Patient positioned supine on the procedure table. The right groin was prepped and draped in the usual fashion. Sterile ultrasound probe cover and sterile gel were utilized throughout the procedure. Ultrasound image documenting patency of the right common femoral artery was obtained and placed in permanent medical record. Following local lidocaine administration, the right common femoral artery was accessed with a 21 gauge needle utilizing continuous ultrasound guidance. 21 gauge needle exchanged for a transitional dilator set over 0.018 inch guidewire. Transitional dilator set exchanged for 5 French sheath over 0.035 inch guidewire. Sauce omni catheter utilized to select the inferior mesenteric  artery. Inferior mesenteric angiogram demonstrated no active extravasation. Progreat  microcatheter was advanced through the Sos Omni more distally into the inferior mesenteric artery towards the rectal branches and angiogram was repeated. No extravasation was identified. Sos omni catheter then utilized to select the right internal iliac artery. Angiogram was performed which showed no active extravasation. Wall min loop formed in the abdominal aorta and C2 glide catheter utilized to select the left internal iliac artery. Left internal iliac artery angiogram demonstrated no active extravasation. C2 glide catheter exchanged for a Sos Omni and inferior mesenteric artery was again accessed. Progreat microcatheter advanced towards the rectum and angiogram was repeated, however no extravasation was identified. Access removed and hemostasis achieved with 10 minutes of manual compression. IMPRESSION: Inferior mesenteric and bilateral internal iliac artery angiogram demonstrates no active hemorrhage in the region of the rectum. No embolization was performed. If the patient has recurrent hemorrhage, recommend colonoscopy. Electronically Signed   By: Miachel Roux M.D.   On: 11/27/2020 14:10   IR Angiogram Pelvis Selective Or Supraselective  Result Date: 11/27/2020 INDICATION: 65 year old gentleman with acute lower GI bleed and hypotension requiring pressors presents today measure radiology for angiogram and possible embolization. CT angiography demonstrates acute hemorrhage in the rectum. EXAM: 1. Ultrasound-guided access of right common femoral artery 2. Bilateral internal iliac angiogram 3. Inferior mesenteric angiogram MEDICATIONS: Ancef 2 g IV. The antibiotic was administered within 1 hour of the procedure ANESTHESIA/SEDATION: None CONTRAST:  Under in 10 mL of Omnipaque 300 FLUOROSCOPY TIME:  Fluoroscopy Time: 17 minutes 24 seconds (or 327 mGy). COMPLICATIONS: None immediate. PROCEDURE: Informed consent was obtained from the patient's sister following explanation of the procedure, risks, benefits and  alternatives. The patient understands, agrees and consents for the procedure. All questions were addressed. A time out was performed prior to the initiation of the procedure. Maximal barrier sterile technique utilized including caps, mask, sterile gowns, sterile gloves, large sterile drape, hand hygiene, and chlorhexidine prep. Patient positioned supine on the procedure table. The right groin was prepped and draped in the usual fashion. Sterile ultrasound probe cover and sterile gel were utilized throughout the procedure. Ultrasound image documenting patency of the right common femoral artery was obtained and placed in permanent medical record. Following local lidocaine administration, the right common femoral artery was accessed with a 21 gauge needle utilizing continuous ultrasound guidance. 21 gauge needle exchanged for a transitional dilator set over 0.018 inch guidewire. Transitional dilator set exchanged for 5 French sheath over 0.035 inch guidewire. Sauce omni catheter utilized to select the inferior mesenteric artery. Inferior mesenteric angiogram demonstrated no active extravasation. Progreat microcatheter was advanced through the Sos Omni more distally into the inferior mesenteric artery towards the rectal branches and angiogram was repeated. No extravasation was identified. Sos omni catheter then utilized to select the right internal iliac artery. Angiogram was performed which showed no active extravasation. Wall min loop formed in the abdominal aorta and C2 glide catheter utilized to select the left internal iliac artery. Left internal iliac artery angiogram demonstrated no active extravasation. C2 glide catheter exchanged for a Sos Omni and inferior mesenteric artery was again accessed. Progreat microcatheter advanced towards the rectum and angiogram was repeated, however no extravasation was identified. Access removed and hemostasis achieved with 10 minutes of manual compression. IMPRESSION: Inferior  mesenteric and bilateral internal iliac artery angiogram demonstrates no active hemorrhage in the region of the rectum. No embolization was performed. If the patient has recurrent hemorrhage, recommend colonoscopy. Electronically Signed   By: Sharen Heck  Mir M.D.   On: 11/27/2020 14:10   IR Angiogram Pelvis Selective Or Supraselective  Result Date: 11/27/2020 INDICATION: 65 year old gentleman with acute lower GI bleed and hypotension requiring pressors presents today measure radiology for angiogram and possible embolization. CT angiography demonstrates acute hemorrhage in the rectum. EXAM: 1. Ultrasound-guided access of right common femoral artery 2. Bilateral internal iliac angiogram 3. Inferior mesenteric angiogram MEDICATIONS: Ancef 2 g IV. The antibiotic was administered within 1 hour of the procedure ANESTHESIA/SEDATION: None CONTRAST:  Under in 10 mL of Omnipaque 300 FLUOROSCOPY TIME:  Fluoroscopy Time: 17 minutes 24 seconds (or 327 mGy). COMPLICATIONS: None immediate. PROCEDURE: Informed consent was obtained from the patient's sister following explanation of the procedure, risks, benefits and alternatives. The patient understands, agrees and consents for the procedure. All questions were addressed. A time out was performed prior to the initiation of the procedure. Maximal barrier sterile technique utilized including caps, mask, sterile gowns, sterile gloves, large sterile drape, hand hygiene, and chlorhexidine prep. Patient positioned supine on the procedure table. The right groin was prepped and draped in the usual fashion. Sterile ultrasound probe cover and sterile gel were utilized throughout the procedure. Ultrasound image documenting patency of the right common femoral artery was obtained and placed in permanent medical record. Following local lidocaine administration, the right common femoral artery was accessed with a 21 gauge needle utilizing continuous ultrasound guidance. 21 gauge needle exchanged  for a transitional dilator set over 0.018 inch guidewire. Transitional dilator set exchanged for 5 French sheath over 0.035 inch guidewire. Sauce omni catheter utilized to select the inferior mesenteric artery. Inferior mesenteric angiogram demonstrated no active extravasation. Progreat microcatheter was advanced through the Sos Omni more distally into the inferior mesenteric artery towards the rectal branches and angiogram was repeated. No extravasation was identified. Sos omni catheter then utilized to select the right internal iliac artery. Angiogram was performed which showed no active extravasation. Wall min loop formed in the abdominal aorta and C2 glide catheter utilized to select the left internal iliac artery. Left internal iliac artery angiogram demonstrated no active extravasation. C2 glide catheter exchanged for a Sos Omni and inferior mesenteric artery was again accessed. Progreat microcatheter advanced towards the rectum and angiogram was repeated, however no extravasation was identified. Access removed and hemostasis achieved with 10 minutes of manual compression. IMPRESSION: Inferior mesenteric and bilateral internal iliac artery angiogram demonstrates no active hemorrhage in the region of the rectum. No embolization was performed. If the patient has recurrent hemorrhage, recommend colonoscopy. Electronically Signed   By: Miachel Roux M.D.   On: 11/27/2020 14:10   IR US Guide Vasc Access Right  Result Date: 11/27/2020 INDICATION: 65 year old gentleman with acute lower GI bleed and hypotension requiring pressors presents today measure radiology for angiogram and possible embolization. CT angiography demonstrates acute hemorrhage in the rectum. EXAM: 1. Ultrasound-guided access of right common femoral artery 2. Bilateral internal iliac angiogram 3. Inferior mesenteric angiogram MEDICATIONS: Ancef 2 g IV. The antibiotic was administered within 1 hour of the procedure ANESTHESIA/SEDATION: None  CONTRAST:  Under in 10 mL of Omnipaque 300 FLUOROSCOPY TIME:  Fluoroscopy Time: 17 minutes 24 seconds (or 327 mGy). COMPLICATIONS: None immediate. PROCEDURE: Informed consent was obtained from the patient's sister following explanation of the procedure, risks, benefits and alternatives. The patient understands, agrees and consents for the procedure. All questions were addressed. A time out was performed prior to the initiation of the procedure. Maximal barrier sterile technique utilized including caps, mask, sterile gowns, sterile gloves, large  sterile drape, hand hygiene, and chlorhexidine prep. Patient positioned supine on the procedure table. The right groin was prepped and draped in the usual fashion. Sterile ultrasound probe cover and sterile gel were utilized throughout the procedure. Ultrasound image documenting patency of the right common femoral artery was obtained and placed in permanent medical record. Following local lidocaine administration, the right common femoral artery was accessed with a 21 gauge needle utilizing continuous ultrasound guidance. 21 gauge needle exchanged for a transitional dilator set over 0.018 inch guidewire. Transitional dilator set exchanged for 5 French sheath over 0.035 inch guidewire. Sauce omni catheter utilized to select the inferior mesenteric artery. Inferior mesenteric angiogram demonstrated no active extravasation. Progreat microcatheter was advanced through the Sos Omni more distally into the inferior mesenteric artery towards the rectal branches and angiogram was repeated. No extravasation was identified. Sos omni catheter then utilized to select the right internal iliac artery. Angiogram was performed which showed no active extravasation. Wall min loop formed in the abdominal aorta and C2 glide catheter utilized to select the left internal iliac artery. Left internal iliac artery angiogram demonstrated no active extravasation. C2 glide catheter exchanged for a Sos  Omni and inferior mesenteric artery was again accessed. Progreat microcatheter advanced towards the rectum and angiogram was repeated, however no extravasation was identified. Access removed and hemostasis achieved with 10 minutes of manual compression. IMPRESSION: Inferior mesenteric and bilateral internal iliac artery angiogram demonstrates no active hemorrhage in the region of the rectum. No embolization was performed. If the patient has recurrent hemorrhage, recommend colonoscopy. Electronically Signed   By: Miachel Roux M.D.   On: 11/27/2020 14:10   CT Angio Abd/Pel w/ and/or w/o  Result Date: 11/27/2020 CLINICAL DATA:  GI bleed EXAM: CTA ABDOMEN AND PELVIS WITHOUT AND WITH CONTRAST TECHNIQUE: Multidetector CT imaging of the abdomen and pelvis was performed using the standard protocol during bolus administration of intravenous contrast. Multiplanar reconstructed images and MIPs were obtained and reviewed to evaluate the vascular anatomy. CONTRAST:  147mL OMNIPAQUE IOHEXOL 350 MG/ML SOLN COMPARISON:  None. FINDINGS: VASCULAR Aorta: Mixed density aortic atherosclerosis without aneurysm, stenosis or dissection. Celiac: Patent without evidence of aneurysm, dissection, vasculitis or significant stenosis. SMA: Patent without evidence of aneurysm, dissection, vasculitis or significant stenosis. Renals: Both renal arteries are patent without evidence of aneurysm, dissection, vasculitis, fibromuscular dysplasia or significant stenosis. IMA: Patent without evidence of aneurysm, dissection, vasculitis or significant stenosis. Inflow: Moderate mixed density atherosclerosis of the proximal left internal iliac artery. Proximal Outflow: Bilateral common femoral and visualized portions of the superficial and profunda femoral arteries are patent without evidence of aneurysm, dissection, vasculitis or significant stenosis. Veins: No obvious venous abnormality within the limitations of this arterial phase study. Review of the  MIP images confirms the above findings. NON-VASCULAR Lower chest: Emphysema Hepatobiliary: No focal liver abnormality is seen. No gallstones, gallbladder wall thickening, or biliary dilatation. Pancreas: Unremarkable. No pancreatic ductal dilatation or surrounding inflammatory changes. Spleen: Normal in size without focal abnormality. Adrenals/Urinary Tract: Adrenal glands are unremarkable. Kidneys are normal, without renal calculi, focal lesion, or hydronephrosis. Thick-walled urinary bladder with Foley catheter. Stomach/Bowel: There is intraluminal high density material interspersed with fluid in the distal rectum. This may arise from 1 of the small perirectal perforating vessels. No other abnormal density. No small bowel obstruction. Lymphatic: No enlarged lymph nodes. Reproductive: Prostate is unremarkable. Other: No abdominal wall hernia or abnormality. No abdominopelvic ascites. Musculoskeletal: No acute or significant osseous findings. IMPRESSION: 1. Intraluminal high density material interspersed with  fluid in the distal rectum, possibly arising from a small perirectal perforating vessels. This could indicate active hemorrhage or angiodysplasia. 2. No other acute findings. These results will be called to the ordering clinician or representative by the Radiologist Assistant, and communication documented in the PACS or [employer]. Aortic Atherosclerosis (ICD10-I70.0) and Emphysema (ICD10-J43.9). Electronically Signed   By: Ulyses Jarred M.D.   On: 11/27/2020 02:01    Labs:  CBC: Recent Labs    11/25/20 0025 11/26/20 0345 11/26/20 2040 11/27/20 0149 11/27/20 0347 11/27/20 1202 11/27/20 1902 11/27/20 2028 11/28/20 0539  WBC 10.6* 11.4*  --   --  26.8*  --   --   --  14.3*  HGB 12.4* 12.3*   < >  --  10.0* 8.8* 9.3* 9.4* 8.7*  HCT 38.7* 38.2*   < >  --  30.3* 26.5* 26.9* 28.0* 25.8*  PLT 437* 565*  --  413* 441*  --   --   --  469*   < > = values in this interval not displayed.     COAGS: Recent Labs    11/19/20 0236 11/20/20 0454 11/21/20 0332 11/27/20 0149  INR 1.1 1.0 1.0 1.1  APTT  --   --   --  28    BMP: Recent Labs    02/24/20 1114 02/24/20 1114 04/21/20 1029 06/02/20 1015 08/09/20 1029 08/24/20 1726 11/25/20 0025 11/26/20 0345 11/27/20 0348 11/28/20 0543  NA 141  --  139   < > 143   < > 135 138 134* 136  K 3.9  --  3.8   < > 3.9   < > 3.9 3.8 4.7 4.0  CL 106  --  105   < > 108   < > 106 104 105 106  CO2 23  --  27   < > 28   < > 22 24 20* 23  GLUCOSE 103*  --  89   < > 96   < > 84 87 98 92  BUN 14  --  18   < > 12   < > 12 13 22  24*  CALCIUM 9.1  --  9.0   < > 9.6   < > 8.0* 8.3* 7.7* 7.8*  CREATININE 1.05   < > 0.87   < > 1.02   < > 0.83 0.82 1.12 1.02  GFRNONAA >60   < > 91   < > 77   < > >60 >60 >60 >60  GFRAA >60  --  105  --  89  --   --   --   --   --    < > = values in this interval not displayed.    LIVER FUNCTION TESTS: Recent Labs    11/16/20 1135 11/17/20 0134 11/18/20 0112 11/19/20 0236  BILITOT 1.1 1.1 0.7 0.6  AST 254* 213* 141* 102*  ALT 113* 94* 74* 65*  ALKPHOS 75 51 48 50  PROT 8.6* 6.5 6.1* 5.9*  ALBUMIN 3.3* 2.4* 2.0* 1.9*    Assessment and Plan: Rectal bleed s/p angiogram by Dr. Dwaine Gale 11/27/20 No bleeding identified during angiogram yesterday.  Groin site intact today. No evidence of hematoma/pseudoaneurysm S/p scope with GI today with multiple rectal ulcers identified.  IR remains available.   Electronically Signed: Docia Barrier, PA 11/28/2020, 1:22 PM   I spent a total of 15 Minutes at the the patient's bedside AND on the patient's hospital floor or unit, greater than 50% of  which was counseling/coordinating care for GI bleed.

## 2020-11-28 NOTE — Evaluation (Signed)
Clinical/Bedside Swallow Evaluation Patient Details  Name: [name] MRN: [id number] Date of Birth: [birth year]  Today's Date: 11/28/2020 Time: SLP Start Time (ACUTE ONLY): 8299 SLP Stop Time (ACUTE ONLY): 0942 SLP Time Calculation (min) (ACUTE ONLY): 19.95 min  Past Medical History:  Past Medical History:  Diagnosis Date  . Acute kidney failure    unspec, secondary to medications  . AIDS ([place]) 06/07/2016  . Alcohol abuse 05/03/2016  . anal ca dx'd 02/2011  . Cachexia ([place])   . 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 Drug abuse ([place])   . GERD (gastroesophageal reflux disease) 08/09/2016  . Grieving 11/29/2017  . Hemorrhoid   . Hepatitis C   . History of radiation therapy 01/01/2011 -05/18/11   anal, pelvis, inguinal lymph nodes  . HIV (human immunodeficiency virus infection) ([place])   . HIV or AIDS   . Housing problems 10/22/2020  . Hypertension   . Loose stools 06/07/2016  . Mass of anus 01/26/2011   squamous cell cancer  . Meningitis   . Mood disorder ([place])   . Pain    right leg  . Pulmonary nodule   . Rash and nonspecific skin eruption 03/31/2015  . Rectal bleeding 08/09/2020  . Testicular hypofunction    Past Surgical History:  Past Surgical History:  Procedure Laterality Date  . BUBBLE STUDY  11/19/2020   Procedure: BUBBLE STUDY;  Surgeon: [name], MD;  Location: [facility];  Service: Cardiology;;  . IR ANGIOGRAM PELVIS SELECTIVE OR SUPRASELECTIVE  11/27/2020  . IR ANGIOGRAM PELVIS SELECTIVE OR SUPRASELECTIVE  11/27/2020  . IR ANGIOGRAM VISCERAL SELECTIVE  11/27/2020  . IR US GUIDE VASC ACCESS RIGHT  11/27/2020  . MASS EXCISION  01/26/2011   squamous cell carcinoma  . TEE WITHOUT CARDIOVERSION N/A 11/19/2020   Procedure: TRANSESOPHAGEAL ECHOCARDIOGRAM (TEE);  Surgeon: [name], MD;  Location: [facility];  Service: Cardiology;  Laterality: N/A;   HPI:  [name] is a 65 y.o. 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 He was found to be encephalopathic at attended with acute renal failure and rhabdomyolysis.  He also has new bullous skin lesions on hands and legs. Pt was found to have multiple infarcts and anoxic changes on MRI 4/12.  CXR 4/12 with no acute findings. MBS 4/16: mild oropharyngeal dysphagia c/b prolonged mastication, premature spillage, delayed swallow initiation, and reduced base of tongue retraction.  These deficits resulted in trace, transient penetration of thin liquid by straw prior to the swallow. Pt was last seen by SLP on 4/22 and was tolerating a dysphagia 3 diet and thin liquids at that time. Rapid response on 4/22 due to hypotension and bloody stools. Pt was hard to arouse at that time and only opened his eyes to painful stimuli. GI was consulted 4/22 secondary to rectal pain and bleeding and rectal bleeding was thought to possibly be from radiation radiation proctitis given hx of anal cancer. Flexible sigmoidoscopy on 4/23; Decubitus ulceration, perianal rash,multiple ulcers in the rectum. Biopsies done and Palliative Care consult recommended for [name].   Assessment / Plan / Recommendation Clinical Impression  Pt was seen for bedside swallow evaluation. Pt's case was discussed with RN who indicated that pt was only NPO for procedure yesterday. GI was contacted and indicated that the pt may be started on diet with solids if so indicated based on the evaluation.  Pt demonstrated prolonged mastication with dysphagia 3 solids. Nasal regurgitation and significant coughing with desaturation to the 70s was noted with thin liquids via straw, suggesting aspiration. Pt recovered soon thereafter with return of SpO2 to 100%. Individual sips of thin liquids via cup were tolerated without overt s/sx of aspiration. A dysphagia 2 diet with thin liquids (no straws) is recommended at this time. Pt does exhibit impulsive  tendencies which is increase his aspiration risk and full supervision is recommended for observance of swallowing precautions. SLP will follow to assess diet tolerance and for possible diet advancement. SLP Visit Diagnosis: Dysphagia, oropharyngeal phase (R13.12)    Aspiration Risk  Mild aspiration risk    Diet Recommendation Dysphagia 2 (Fine chop);Thin liquid   Liquid Administration via: Cup;No straw Medication Administration: Whole meds with puree Supervision: Staff to assist with self feeding;Full supervision/cueing for compensatory strategies Compensations: Slow rate;Small sips/bites;Minimize environmental distractions Postural Changes: Seated upright at 90 degrees    Other  Recommendations Oral Care Recommendations: Oral care BID;Staff/trained caregiver to provide oral care   Follow up Recommendations  (TBD)      Frequency and Duration min 2x/week  2 weeks       Prognosis Prognosis for Safe Diet Advancement: Good Barriers to Reach Goals: Cognitive deficits      Swallow Study   General Date of Onset: 11/16/20 HPI: [name] is a 65 y.o. 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 He was found to be encephalopathic at attended with acute renal failure and rhabdomyolysis.  He also has new bullous skin lesions on hands and legs. Pt was found to have multiple infarcts and anoxic changes on MRI 4/12.  CXR 4/12 with no acute findings. MBS 4/16: mild oropharyngeal dysphagia c/b prolonged mastication, premature spillage, delayed swallow initiation, and reduced base of tongue retraction.  These deficits resulted in trace, transient penetration of thin liquid by straw prior to the swallow. Pt was last seen by SLP on 4/22 and was tolerating a dysphagia 3 diet and thin liquids at that time. Rapid response on 4/22 due to hypotension and bloody stools.  Pt was hard to arouse at that time and only opened his eyes to painful stimuli. GI was consulted 4/22 secondary to rectal pain and bleeding and rectal bleeding was thought to possibly be from radiation radiation proctitis given hx of anal cancer. Flexible sigmoidoscopy on 4/23; Decubitus ulceration, perianal rash,multiple ulcers in the rectum. Biopsies done and Palliative Care consult recommended for [name]. Type of Study: Bedside Swallow Evaluation Previous Swallow Assessment: See HPI Diet Prior to this Study: NPO Temperature Spikes Noted: No Respiratory Status: Nasal cannula History of Recent Intubation: No Behavior/Cognition: Alert;Cooperative Oral Cavity Assessment: Within Functional Limits Oral Care Completed by SLP: No Oral Cavity - Dentition: Missing dentition;Poor condition Vision: Functional for self-feeding Self-Feeding Abilities: Needs assist Patient Positioning: Upright in bed;Postural control adequate for testing Baseline Vocal Quality: Low vocal intensity Volitional Cough: Weak Volitional Swallow: Able to elicit    Oral/Motor/Sensory Function Overall Oral Motor/Sensory Function: Mild impairment Facial ROM: Reduced right;Reduced left Facial Symmetry: Within Functional Limits Lingual Symmetry: Within Functional Limits Lingual Strength: Reduced Velum: Within Functional Limits Mandible: Within Functional Limits   Ice Chips Ice chips: Within functional limits Presentation: Spoon   Thin Liquid Thin Liquid: Impaired Presentation: Cup;Straw Pharyngeal  Phase Impairments: Cough - Immediate    Nectar Thick Nectar Thick  Liquid: Not tested   Honey Thick Honey Thick Liquid: Not tested   Puree Puree: Within functional limits Presentation: Spoon   Solid     Solid: Impaired Oral Phase Impairments: Impaired mastication     [name], [place], [place] Office number [phone] Pager [phone]  Horton Marshall 11/28/2020,9:59  AM

## 2020-11-28 NOTE — Progress Notes (Signed)
NAME:  [name], MRN:  [id number], DOB:  1956/03/08, LOS: 49 ADMISSION DATE:  11/16/2020, CONSULTATION DATE:  11/26/20 REFERRING MD:  Graciella Freer, CHIEF COMPLAINT:  Hypotensive   History of Present Illness:  65 yo M PMH anal squamous cell carcinoma, HIV, HCV, HTN, HLD, polysubstance abuse who presented to ED 4/13 with AMS. Found to have scattered acute/subacute infarcts for which neurology was consulted. ID consulted 4/13 as well to evaluate for possible infectious causes of encephalopathy, without infectious etiology. Continuing to follow for bullous lesions, HCV, HIV. During this hospitalization it was found that the pt has new HFrEF (45%).   Developed GIB 4/21. CCS csonulted and rec GI consult --saw pt 4/22. Later 4/22 pt with continued bleeding and new hypotension.   PCCM consulted in this setting  Pertinent  Medical History  Anal squamous cell carcinoma  HIV, AIDS HCV HTN HLD Tobacco use    Significant Hospital Events: Including procedures, antibiotic start and stop dates in addition to other pertinent events   4/13 admitted to IMTS for AMS  4/14 cards consult for TEE  4/21- [facility] consult for GIB, rec GI 4/22 GI consulted. Later hypotensive, hgb stable. PCCM consulted. 4/23 VIR angiogram without target, no intervention, Flex sig with rectal ulcers, unable to intervene  Interim History / Subjective:  NE off, giving 1L LR, Hgb stable. Conversant, weak.  Objective   Blood pressure 116/67, pulse 87, temperature 98.5 F (36.9 C), temperature source Axillary, resp. rate (!) 24, height 5\' 7"  (1.702 m), weight 54.9 kg, SpO2 96 %.        Intake/Output Summary (Last 24 hours) at 11/28/2020 0920 Last data filed at 11/28/2020 0900 Gross per 24 hour  Intake 1378.57 ml  Output 1210 ml  Net 168.57 ml   Filed Weights   11/26/20 0500 11/27/20 0200 11/28/20 0600  Weight: 58.9 kg 60.7 kg 54.9 kg    Examination: General: Chronically and acutely ill appearing thin frail M, appears  older than stated age.  HENT: [place]. Pink mm. Trachea midline Lungs: Symmetrical chest expansion, even, unlabored on RA.  Cardiovascular: tachycardic, regular rhythm s1s2 cap refill < 3 sec Abdomen: Mildly distended, soft, non-tender Extremities: No acute joint deformity, no cyanosis or clubbing.  Symmetrically decreased bulk  Neuro: Awake, speaks, weak. Moving arms spontaneously  Labs/imaging that I havepersonally reviewed  (right click and "Reselect all SmartList Selections" daily)  CBC, CXR, CT, chemistries  Resolved Hospital Problem list     Assessment & Plan:   Acute encephalopathy in setting of stroke Stroke- multifocal bilateral infarcts Intermittently verbal, exam non-focal conversion of larger infarct P -Hold ASA/plavix in setting of bleeding  -follow neuro exam  Hemorrhagic Shock - improving Bleeding from rectum -- ulcers on flex sig, no target for therapy, grim prognosis P -NE, MAP > 65 -NS bolus 1L  HIV HCV Bullous skin lesions, aphthous ulcers  P -per ID -switching to biktarvy   Hx Anal squamous cell carcinoma s/p excision, chemo, radiation, recent MRI w/o evidence of disease Fecal incontinence - post excision  P -supportive care   Best practice (right click and "Reselect all SmartList Selections" daily)  Diet:  Oral Pain/Anxiety/Delirium protocol (if indicated): No VAP protocol (if indicated): Not indicated DVT prophylaxis: SCD GI prophylaxis: PPI Glucose control:  SSI No Central venous access:  N/A Arterial line:  N/A Foley:  N/A Mobility:  bed rest  PT consulted: Yes Last date of multidisciplinary goals of care discussion [pending] Code Status:  full code Disposition: ICU  Critical care time:     CRITICAL CARE Performed by: Lanier Clam   Total critical care time: 32 minutes  Critical care time was exclusive of separately billable procedures and treating other patients.  Critical care was necessary to treat or prevent imminent  or life-threatening deterioration.  Critical care was time spent personally by me on the following activities: development of treatment plan with patient and/or surrogate as well as nursing, discussions with consultants, evaluation of patient's response to treatment, examination of patient, obtaining history from patient or surrogate, ordering and performing treatments and interventions, ordering and review of laboratory studies, ordering and review of radiographic studies, pulse oximetry and re-evaluation of patient's condition.  Lanier Clam MD See Amion for contact info 11/28/2020, 9:20 AM

## 2020-11-28 NOTE — Progress Notes (Signed)
eLink Physician-Brief Progress Note Patient Name: [name] DOB: [birth year] MRN: [id number]   Date of Service  11/28/2020  HPI/Events of Note  Patient is about to be transferred to 5W and needs NIHSS orders discontinued prior to transfer.  eICU Interventions  Orders discontinued.        Kerry Kass Sair Faulcon 11/28/2020, 8:05 PM

## 2020-11-29 ENCOUNTER — Encounter (HOSPITAL_COMMUNITY): Payer: Self-pay | Admitting: Gastroenterology

## 2020-11-29 LAB — HEMOGLOBIN AND HEMATOCRIT, BLOOD
HCT: 22.8 % — ABNORMAL LOW (ref 39.0–52.0)
HCT: 22.5 % — ABNORMAL LOW (ref 39.0–52.0)
HCT: 22.6 % — ABNORMAL LOW (ref 39.0–52.0)
Hemoglobin: 7.5 g/dL — ABNORMAL LOW (ref 13.0–17.0)
Hemoglobin: 7.2 g/dL — ABNORMAL LOW (ref 13.0–17.0)
Hemoglobin: 7.2 g/dL — ABNORMAL LOW (ref 13.0–17.0)

## 2020-11-29 LAB — TYPE AND SCREEN
ABO/RH(D): O NEG
Antibody Screen: NEGATIVE
Unit division: 0
Unit division: 0
Unit division: 0

## 2020-11-29 LAB — PORPHYRINS, FRACTIONATION-PLASMA
Coproporphyrin.: <1 ug/dL (ref <1.0)
Heptacarboxyl Porphyrins: <1 ug/dL (ref <1.0)
Hexacarboxyl Porphyrins: <1.5 ug/dL (ref <1.5)
Pentacarboxyl Porphyrins: <1 ug/dL (ref <1.0)
Protoporphyrin: <1.5 ug/dL (ref <1.5)
Uroporphyrin: <1.5 ug/dL (ref <1.5)

## 2020-11-29 LAB — BPAM RBC
Blood Product Expiration Date: 202205032359
Blood Product Expiration Date: 202205052359
Blood Product Expiration Date: 202205162359
ISSUE DATE / TIME: 202204222335
ISSUE DATE / TIME: 202204230023
ISSUE DATE / TIME: 202204231547
Unit Type and Rh: 5100
Unit Type and Rh: 9500
Unit Type and Rh: 9500

## 2020-11-29 LAB — BPAM PLATELET PHERESIS
Blood Product Expiration Date: 202204232359
ISSUE DATE / TIME: 202204230443
Unit Type and Rh: 9500

## 2020-11-29 LAB — PREPARE PLATELET PHERESIS: Unit division: 0

## 2020-11-29 MED ORDER — COLLAGENASE 250 UNIT/GM EX OINT
TOPICAL_OINTMENT | Freq: Every day | CUTANEOUS | Status: DC
  Filled 2020-11-29 (×2): qty 30

## 2020-11-29 NOTE — Progress Notes (Addendum)
Subjective:   ICU transfer summary: [name] was admitted under the care of IMTS on 4/13 for encephalopathy secondary to multiple ischemic brain infarcts and evidence of hypoxic brain injury. His acute issues were stabilizing, and he was having some improvement in his mentation. On 4/22 he developed brisk lower GI bleed resulting in hemorrhagic shock requiring transfer to the ICU. He received 3 units pRBCs and 1 unit of platelets. IR was consulted for angiography and possible embolectomy, but there was no active extravasation identified. GI performed bedside flex sig which showed several ulcers, but nothing to actively intervene on. By 4/24, his blood counts and hemodynamics stabilized.  He was subsequently transferred back to IMTS on 4/25.   On evaluation this morning, he is answers some questions but is disoriented. He does not indicate that he is in any discomfort at this time. Continues to say he wants to cut me a slice of cake.  Nursing reports no further bloody bowel movements.   Objective:  Vital signs in last 24 hours: Vitals:   11/28/20 2031 11/28/20 2055 11/28/20 2113 11/29/20 0545  BP:   (!) 143/70 137/71  Pulse:   93 90  Resp:   (!) 24 (!) 23  Temp: 100.3 F (37.9 C) 99 F (37.2 C) 99.9 F (37.7 C) 99.1 F (37.3 C)  TempSrc: Axillary Axillary Oral Oral  SpO2:   100% 100%  Weight:   60.2 kg   Height:       General: awake, chronically ill appearing, lying in bed in NAD CV: RRR; no m/r/rg Pulm: normal work of breathing on room air; lungs CTA in anterior fields Abd: mildly distended, non-tender, BS+ Ext: warm, well-perfused; no edema Neuro: awake but disoriented. No focal deficits   Assessment/Plan:  Principal Problem:   Anoxic brain injury ([place]) Active Problems:   Acute encephalopathy   Pressure injury of skin   Acute renal failure (HCC)   Traumatic rhabdomyolysis (HCC)   Bullous skin disease   Multiple cerebral infarctions (HCC)   Rectal cancer (HCC)    Acute combined systolic and diastolic heart failure (HCC)   Hyperkalemia   Tongue lesion   Protein-calorie malnutrition, severe   Hemorrhagic shock (HCC)  [name] is a 65 year old man with a history of AIDS and substance use admitted with acute encephalopathy found to have numerous new acute/subacute ischemic infarcts on MRI Brain. Hospital course complicated by hemorrhagic shock secondary to LGIB requiring brief stay in ICU. He is now hemodynamically stable and transferred back to IMTS.   Acute encephalopathy Multiple acute/subacute ischemic infarcts Hypoxic ischemic injury  Had acute worsening secondary to hemorrhagic shock. Should hopefully improve over the next couple of days. It is unclear how much his mentation will recover based on multiple insults.  -DAPT has been on hold due to acute LGIB; if remains stable over the next 24 hours can resume -continue statin -continue PT/OT efforts while admitted  -dysphagia 2 diet   Acute LGIB complicated by hemorrhagic shock, resolved Multiple rectal ulcers  -bedside flex sig showed multiple large ulcers which were likely the source of bleeding, but there was nothing to actively intervene on. Pathology pending.  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

## 2020-11-29 NOTE — Progress Notes (Signed)
Occupational Therapy Treatment Patient Details Name: [name] MRN: [id number] DOB: 05-Nov-1955 Today's Date: 11/29/2020    History of present illness [name] is a 65 y.o. man with a medical history of AIDS, alcohol abuse, drug abuse, Hepatitis C, squamous cell cancer of the rectum s/p resection and radiation who presents with altered mental status   OT comments  Pt with slow progress towards OT goals. Pt lethargic on entry, but arouses with time and sitting EOB. Pt overall Total A x 2 for bed mobility, but able to maintain sitting balance with Min to Mod A. Guided pt in PROM stretching of B UE (increased tone noted) and truncal/head rotations EOB to encourage joint flexibility and prevent contractures. Pt with inconsistent following of commands, negative response to painful stimuli and +blink to threat. Pt requires Total A for all ADLs including washing face and peri care after bowel incontinence today. Continue to recommend SNF at DC.    Follow Up Recommendations  SNF;Supervision/Assistance - 24 hour    Equipment Recommendations  Wheelchair (measurements OT);Wheelchair cushion (measurements OT);Hospital bed    Recommendations for Other Services      Precautions / Restrictions Precautions Precautions: Fall Precaution Comments: found down, skin integrity Restrictions Weight Bearing Restrictions: No       Mobility Bed Mobility Overal bed mobility: Needs Assistance Bed Mobility: Supine to Sit;Sit to Supine     Supine to sit: Total assist;+2 for physical assistance Sit to supine: Total assist;+2 for physical assistance;+2 for safety/equipment   General bed mobility comments: Total A x 2 needed for all bed mobility with poor initiation noted    Transfers                      Balance Overall balance assessment: Needs assistance Sitting-balance support: Feet supported;Bilateral upper extremity supported Sitting balance-Leahy Scale: Poor Sitting balance -  Comments: Pt required Mod A to maintain upright posture on EOB, but able to help at times for short bursts requiring min A to maintain posture. Postural control: Left lateral lean                                 ADL either performed or assessed with clinical judgement   ADL Overall ADL's : Needs assistance/impaired     Grooming: Total assistance;Sitting;Wash/dry face Grooming Details (indicate cue type and reason): Total A, attempted hand over hand motions, increased time for cues, etc with minimal responses                     Toileting- Clothing Manipulation and Hygiene: Total assistance;Bed level Toileting - Clothing Manipulation Details (indicate cue type and reason): Total A for cleanup after bowel incontinence       General ADL Comments: Pt unable to consistently follow commands, guided pt in PROM stretching of BUE and truncal/head rotation exercises EOB.     Vision   Vision Assessment?: No apparent visual deficits   Perception     Praxis      Cognition Arousal/Alertness: Awake/alert Behavior During Therapy: Flat affect Overall Cognitive Status: No family/caregiver present to determine baseline cognitive functioning Area of Impairment: Following commands;Problem solving;Attention;Awareness                   Current Attention Level: Focused   Following Commands: Follows one step commands inconsistently;Follows one step commands with increased time   Awareness: Intellectual Problem Solving: Slow processing;Requires verbal  cues;Requires tactile cues;Difficulty sequencing;Decreased initiation General Comments: Eyes closed often during session, inconsistent following of commands, +blink to threat ut no response to painful stimuli today        Exercises Exercises: General Upper Extremity;Other exercises General Exercises - Upper Extremity Shoulder Flexion: PROM;Both;5 reps;Supine Shoulder Extension: PROM;Both;5 reps;Supine Shoulder  Horizontal ABduction: 5 reps;Both;Supine;PROM Shoulder Horizontal ADduction: PROM;Both;5 reps;Supine Elbow Flexion: PROM;Both;5 reps;Supine Elbow Extension: PROM;Both;5 reps;Supine Wrist Flexion: PROM;Both;5 reps;Supine Wrist Extension: PROM;Supine;Both;5 reps Digit Composite Flexion: PROM;Both;5 reps;Supine Composite Extension: PROM;Both;5 reps;Supine Other Exercises Other Exercises: cervical rotation/extension Other Exercises: truncal rotation/stretching   Shoulder Instructions       General Comments At EOB, PT stretch trunk and ankle DF intensively. Pt showed no signs of discomfort or responses to deep touch at the nail beds.    Pertinent Vitals/ Pain       Pain Assessment: Faces Faces Pain Scale: No hurt Pain Location: due to mental status, difficult to asses; No overt signs to discomfort/pain with intense stretching to trunk and LE and no withdrawl response upon deep touch to nail bed. Pain Intervention(s): Monitored during session  Home Living                                          Prior Functioning/Environment              Frequency  Min 2X/week        Progress Toward Goals  OT Goals(current goals can now be found in the care plan section)  Progress towards OT goals: OT to reassess next treatment  Acute Rehab OT Goals Patient Stated Goal: none stated OT Goal Formulation: Patient unable to participate in goal setting Time For Goal Achievement: 12/06/20 Potential to Achieve Goals: Good ADL Goals Pt Will Perform Grooming: with supervision;with set-up;sitting Pt Will Perform Upper Body Bathing: with mod assist;with min assist;sitting Pt Will Perform Upper Body Dressing: with mod assist;with min assist;sitting Pt Will Transfer to Toilet: with max assist;stand pivot transfer;bedside commode Additional ADL Goal #1: Pt will feed self with 1-2 verbal cues for safety to swallow before next bite and to not spill drinks Additional ADL Goal #2: Pt  will follow 1-2 commnads with min verbal and phsyical cues  Plan Discharge plan remains appropriate    Co-evaluation    PT/OT/SLP Co-Evaluation/Treatment: Yes Reason for Co-Treatment: Complexity of the patient's impairments (multi-system involvement);Necessary to address cognition/behavior during functional activity;For patient/therapist safety;To address functional/ADL transfers   OT goals addressed during session: ADL's and self-care;Strengthening/ROM      AM-PAC OT "6 Clicks" Daily Activity     Outcome Measure   Help from another person eating meals?: Total Help from another person taking care of personal grooming?: Total Help from another person toileting, which includes using toliet, bedpan, or urinal?: Total Help from another person bathing (including washing, rinsing, drying)?: Total Help from another person to put on and taking off regular upper body clothing?: Total Help from another person to put on and taking off regular lower body clothing?: Total 6 Click Score: 6    End of Session    OT Visit Diagnosis: Unsteadiness on feet (R26.81);Other abnormalities of gait and mobility (R26.89);Muscle weakness (generalized) (M62.81);History of falling (Z91.81);Other symptoms and signs involving cognitive function;Cognitive communication deficit (R41.841)   Activity Tolerance Patient limited by fatigue   Patient Left in bed;Other (comment) (with PT/PT student)   Nurse Communication Mobility status;Other (comment) (  wound uncovered)        Time: 0998-3382 OT Time Calculation (min): 38 min  Charges: OT General Charges $OT Visit: 1 Visit OT Treatments $Self Care/Home Management : 8-22 mins  Malachy Chamber, OTR/L Acute Rehab Services Office: [phone]   Layla Maw 11/29/2020, 3:05 PM

## 2020-11-29 NOTE — Consult Note (Signed)
WOC Nurse Consult Note: Patient receiving care in [facility] 5M16. Reason for Consult: buttocks wound Wound type: unstageable PIs to bilateral buttocks, gluteal cleft, coccyx area Pressure Injury POA: Yes Measurement: 13 cm x 13 cm Wound bed: yellow and tan Drainage (amount, consistency, odor) none Periwound: intact Dressing procedure/placement/frequency: Apply Santyl to bilateral buttocks wounds in a nickel thick layer. Cover with a saline moistened gauze, then dry gauze or ABD pad.  Change daily.   I have also ordered an air mattress and bilateral prevalon heel lift boots. Thank you for the consult.  [place] nurse will not follow at this time.  Please re-consult the [place] team if needed.  Val Riles, RN, MSN, CWOCN, CNS-BC, pager [phone]

## 2020-11-29 NOTE — Progress Notes (Signed)
Internal Medicine Attending Note:  I have seen and evaluated this patient and I have discussed the plan of care with the house staff. Please see their note for complete details. I concur with their findings.  Patient seems to be developing upper extremity contractures, increased tone in R>L upper extremity. I am able to completely extend his left upper extremity but with much resistance. PT and OT have been consulted, appreciate assistance.  Unfortunately I do not anticipate his mental status will improve much from here. We need to address goals of care, but are waiting on biopsy path report from his rectal ulcers. This will be important for further prognostication. Plan for family meeting this week.    Velna Ochs, MD 11/29/2020, 4:24 PM

## 2020-11-29 NOTE — Progress Notes (Incomplete)
Physical Therapy Treatment Patient Details Name: [name] MRN: [id number] DOB: September 01, 1955 Today's Date: 11/29/2020    History of Present Illness [name] is a 65 y.o. 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 Pericare performed prior to treatment. Noticed unstageable pressure wound at sacrum and bilateral buttocks. Discussed with RN who is awaiting proper orders and supplies before dressing the wound. Note an order has been placed for air bed, per RN. Pt required total assist today to get to EOB and back to bed. He responded to yes/no questions (I.e. "would you like a blanket") with a blink once for yes and twice for no, however would need to further test the reliability. He would benefit further from PT to increase flexibility, strength and functional independence. Continue to recommend SNF at d/c.   Follow Up Recommendations  SNF     Equipment Recommendations  None recommended by PT    Recommendations for Other Services Rehab consult     Precautions / Restrictions Precautions Precautions: Fall Restrictions Weight Bearing Restrictions: No    Mobility  Bed Mobility Overal bed mobility: Needs Assistance Bed Mobility: Supine to Sit;Sit to Supine     Supine to sit: Total assist;+2 for physical assistance Sit to supine: Max assist;+2 for physical assistance   General bed mobility comments: Able to sit EOB with min-Mod A to maintain upright posture. Pt maintained upright position on his own at times for short durations.    Transfers                    Ambulation/Gait                 Stairs             Wheelchair Mobility    Modified Rankin (Stroke Patients Only)       Balance Overall balance assessment: Needs assistance Sitting-balance support: Feet  supported;Bilateral upper extremity supported Sitting balance-Leahy Scale: Poor Sitting balance - Comments: Pt required Mod A to maintain upright posture on EOB, but able to help at times for short bursts requiring min A to maintain posture. Postural control: Left lateral lean                                  Cognition Arousal/Alertness: Awake/alert Behavior During Therapy: Flat affect Overall Cognitive Status: No family/caregiver present to determine baseline cognitive functioning                                        Exercises      General Comments General comments (skin integrity, edema, etc.): At EOB, PT stretch trunk and ankle DF intensively. Pt showed no signs of discomfort or responses to deep touch at the nail beds.      Pertinent Vitals/Pain Pain Location: due to mental status, difficult to asses; No overt signs to discomfort/pain with intense stretching to trunk and LE and no withdrawl response upon deep touch to nail bed.    Home Living                      Prior Function  PT Goals (current goals can now be found in the care plan section) Acute Rehab PT Goals Patient Stated Goal: none stated Potential to Achieve Goals: Fair Progress towards PT goals: Progressing toward goals    Frequency    Min 3X/week      PT Plan Current plan remains appropriate    Co-evaluation              AM-PAC PT "6 Clicks" Mobility   Outcome Measure  Help needed turning from your back to your side while in a flat bed without using bedrails?: Total Help needed moving from lying on your back to sitting on the side of a flat bed without using bedrails?: Total Help needed moving to and from a bed to a chair (including a wheelchair)?: Total Help needed standing up from a chair using your arms (e.g., wheelchair or bedside chair)?: Total Help needed to walk in hospital room?: Total Help needed climbing 3-5 steps with a railing?  : Total 6 Click Score: 6    End of Session   Activity Tolerance: Patient tolerated treatment well Patient left: in bed;with call bell/phone within reach;with bed alarm set Nurse Communication: Mobility status PT Visit Diagnosis: Unsteadiness on feet (R26.81);Other abnormalities of gait and mobility (R26.89);Repeated falls (R29.6);Muscle weakness (generalized) (M62.81);History of falling (Z91.81)     Time: 3159-4585 PT Time Calculation (min) (ACUTE ONLY): 44 min  Charges:                         Sandria Manly, Daymon Larsen 11/29/2020, 2:41 PM

## 2020-11-29 NOTE — Progress Notes (Signed)
Physical Therapy Treatment Patient Details Name: [name] MRN: [id number] DOB: [birth year] Today's Date: 11/29/2020    History of Present Illness [name] is a 65 y.o. 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 Pt overall Total A x 2 for bed mobility, but able to maintain sitting balance with Min to Mod A. Guided pt in  truncal/head rotations EOB to encourage joint flexibility and prevent contractures. Pt with inconsistent following of commands, negative response to painful stimuli and +blink to threat. Continue to recommend SNF for dc  Follow Up Recommendations  SNF     Equipment Recommendations  Wheelchair (measurements PT);Wheelchair cushion (measurements PT);Hospital bed (mechanical lift)    Recommendations for Other Services Rehab consult     Precautions / Restrictions Precautions Precautions: Fall Precaution Comments: found down, skin integrity Restrictions Weight Bearing Restrictions: No    Mobility  Bed Mobility Overal bed mobility: Needs Assistance Bed Mobility: Supine to Sit;Sit to Supine     Supine to sit: Total assist;+2 for physical assistance Sit to supine: Total assist;+2 for physical assistance;+2 for safety/equipment   General bed mobility comments: Total A x 2 needed for all bed mobility with poor initiation noted    Transfers                 General transfer comment: Opted to lay back to bed for dressing wounds when Santyl arrives on unit  Ambulation/Gait                 Stairs              Wheelchair Mobility    Modified Rankin (Stroke Patients Only)       Balance Overall balance assessment: Needs assistance Sitting-balance support: Feet supported;Bilateral upper extremity supported Sitting balance-Leahy Scale: Poor Sitting balance - Comments: Pt required Mod A to maintain upright posture on EOB, but able to help at times for short bursts requiring min A to maintain posture. Provided pt with trunk extension and rotation stretches while sitting EOB; eyes opened wider with stretching, but no appreciable grimace Postural control: Left lateral lean                                  Cognition Arousal/Alertness: Awake/alert Behavior During Therapy: Flat affect Overall Cognitive Status: No family/caregiver present to determine baseline cognitive functioning Area of Impairment: Following commands;Problem solving;Attention;Awareness                   Current Attention Level: Focused   Following Commands: Follows one step commands inconsistently;Follows one step commands with increased time   Awareness: Intellectual Problem Solving: Slow processing;Requires verbal cues;Requires tactile cues;Difficulty sequencing;Decreased initiation General Comments: Eyes closed often during session, inconsistent following of commands, +blink to threat ut no response to painful stimuli today      Exercises General Exercises - Upper Extremity Shoulder Flexion: PROM;Both;5 reps;Supine Shoulder Extension: PROM;Both;5 reps;Supine Shoulder Horizontal ABduction: 5 reps;Both;Supine;PROM Shoulder Horizontal ADduction: PROM;Both;5 reps;Supine Elbow Flexion: PROM;Both;5 reps;Supine Elbow Extension: PROM;Both;5 reps;Supine Wrist Flexion: PROM;Both;5  reps;Supine Wrist Extension: PROM;Supine;Both;5 reps Digit Composite Flexion: PROM;Both;5 reps;Supine Composite Extension: PROM;Both;5 reps;Supine Other Exercises Other Exercises: cervical rotation/extension Other  Exercises: truncal rotation/stretching Other Exercises: Dorsiflexion stretches bilaterally    General Comments General comments (skin integrity, edema, etc.): At EOB, PT stretch trunk and ankle DF intensively. Pt showed no signs of discomfort or responses to deep touch at the nail beds.      Pertinent Vitals/Pain Pain Assessment: Faces Faces Pain Scale: No hurt Pain Location: due to mental status, difficult to asses; No overt signs to discomfort/pain with intense stretching to trunk and LE and no withdrawl response upon deep touch to nail bed. Pain Intervention(s): Monitored during session    Home Living                      Prior Function            PT Goals (current goals can now be found in the care plan section) Acute Rehab PT Goals Patient Stated Goal: none stated PT Goal Formulation: Patient unable to participate in goal setting Time For Goal Achievement: 12/06/20 Potential to Achieve Goals: Fair Progress towards PT goals: Not progressing toward goals - comment (largely unchanged compared to previous session)    Frequency    Min 3X/week      PT Plan Current plan remains appropriate    Co-evaluation PT/OT/SLP Co-Evaluation/Treatment: Yes Reason for Co-Treatment: Complexity of the patient's impairments (multi-system involvement);Necessary to address cognition/behavior during functional activity;To address functional/ADL transfers PT goals addressed during session: Mobility/safety with mobility OT goals addressed during session: ADL's and self-care;Strengthening/ROM      AM-PAC PT "6 Clicks" Mobility   Outcome Measure  Help needed turning from your back to your side while in a flat bed without using bedrails?: Total Help needed moving from lying on your back to sitting on the side of a flat bed without using bedrails?: Total Help needed moving to and from a bed to a chair (including a wheelchair)?: Total Help needed standing up from a chair using your  arms (e.g., wheelchair or bedside chair)?: Total Help needed to walk in hospital room?: Total Help needed climbing 3-5 steps with a railing? : Total 6 Click Score: 6    End of Session   Activity Tolerance: Patient tolerated treatment well Patient left: in bed;with call bell/phone within reach;with bed alarm set;Other (comment) (Prevalon boots applied) Nurse Communication: Mobility status;Other (comment) (and assisted with cleaning after stool) PT Visit Diagnosis: Unsteadiness on feet (R26.81);Other abnormalities of gait and mobility (R26.89);Repeated falls (R29.6);Muscle weakness (generalized) (M62.81);History of falling (Z91.81)     Time: 2706-2376 PT Time Calculation (min) (ACUTE ONLY): 44 min  Charges:  $Therapeutic Activity: 8-22 mins                     Roney Marion, PT  Acute Rehabilitation Services Pager [phone] Office [place] 11/29/2020, 6:03 PM

## 2020-11-29 NOTE — Progress Notes (Addendum)
Pt in air mattress bed. Applied Santyl to buttocks.   Paulla Fore, RN, BSN

## 2020-11-29 NOTE — Progress Notes (Signed)
Chart reviewed. Patient previously in ICU for GI bleed and encephalopathy - critical care needs resolved.  PCCM will see as needed, please don't hesitate to contact if we can be of further assistance.   Lenice Llamas, MD Pulmonary and [place]

## 2020-11-30 ENCOUNTER — Inpatient Hospital Stay (HOSPITAL_COMMUNITY): Payer: Medicare Other

## 2020-11-30 LAB — CBC WITH DIFFERENTIAL/PLATELET
Abs Immature Granulocytes: 0.28 10*3/uL — ABNORMAL HIGH (ref 0.00–0.07)
Basophils Absolute: 0.1 10*3/uL (ref 0.0–0.1)
Basophils Relative: 1 %
Eosinophils Absolute: 0.1 10*3/uL (ref 0.0–0.5)
Eosinophils Relative: 1 %
HCT: 23.2 % — ABNORMAL LOW (ref 39.0–52.0)
Hemoglobin: 7.5 g/dL — ABNORMAL LOW (ref 13.0–17.0)
Immature Granulocytes: 3 %
Lymphocytes Relative: 15 %
Lymphs Abs: 1.5 10*3/uL (ref 0.7–4.0)
MCH: 29 pg (ref 26.0–34.0)
MCHC: 32.3 g/dL (ref 30.0–36.0)
MCV: 89.6 fL (ref 80.0–100.0)
Monocytes Absolute: 1 10*3/uL (ref 0.1–1.0)
Monocytes Relative: 10 %
Neutro Abs: 6.9 10*3/uL (ref 1.7–7.7)
Neutrophils Relative %: 70 %
Platelets: 485 10*3/uL — ABNORMAL HIGH (ref 150–400)
RBC: 2.59 MIL/uL — ABNORMAL LOW (ref 4.22–5.81)
RDW: 14.6 % (ref 11.5–15.5)
WBC: 9.9 10*3/uL (ref 4.0–10.5)
nRBC: 0 % (ref 0.0–0.2)

## 2020-11-30 LAB — BASIC METABOLIC PANEL
Anion gap: 6 (ref 5–15)
BUN: 10 mg/dL (ref 8–23)
CO2: 25 mmol/L (ref 22–32)
Calcium: 8 mg/dL — ABNORMAL LOW (ref 8.9–10.3)
Chloride: 106 mmol/L (ref 98–111)
Creatinine, Ser: 0.84 mg/dL (ref 0.61–1.24)
GFR, Estimated: >60 mL/min (ref >60)
Glucose, Bld: 92 mg/dL (ref 70–99)
Potassium: 3.5 mmol/L (ref 3.5–5.1)
Sodium: 137 mmol/L (ref 135–145)

## 2020-11-30 LAB — PORPHOBILINOGEN, RANDOM URINE: Quantitative Porphobilinogen: 1.6 mg/L (ref 0.0–2.0)

## 2020-11-30 LAB — HEMOGLOBIN AND HEMATOCRIT, BLOOD
HCT: 23.5 % — ABNORMAL LOW (ref 39.0–52.0)
Hemoglobin: 7.6 g/dL — ABNORMAL LOW (ref 13.0–17.0)

## 2020-11-30 LAB — SURGICAL PATHOLOGY

## 2020-11-30 IMAGING — MR MR HEAD W/O CM
11 of 12 series · 43 of 48 positions shown · non-contrast
Comparison: MRI head [DATE]

CLINICAL DATA: Stroke.  Mental status change.

EXAM:
MRI HEAD WITHOUT CONTRAST
TECHNIQUE: Multiplanar, multiecho pulse sequences of the brain and surrounding
structures were obtained without intravenous contrast.

[Series 5: DWI · axial · 3.0mm · 0.88mm/px · z∈[-57,+85]mm · 9 of 100 slices shown (1 of 4)]
[im 1/100]
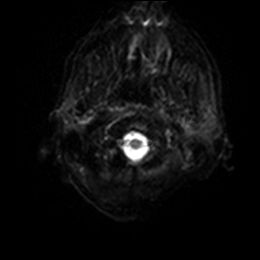
[im 13/100]
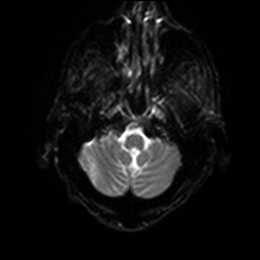
[im 25/100]
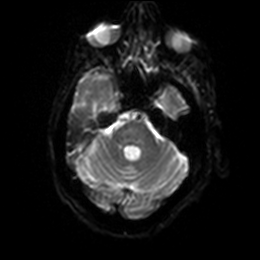
[im 38/100]
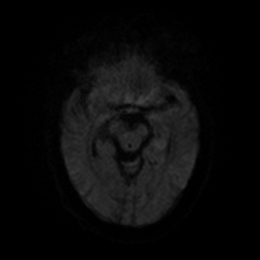
[im 50/100]
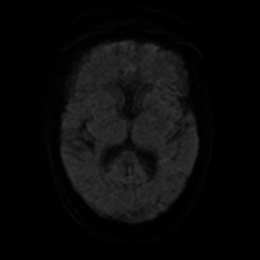
[im 62/100]
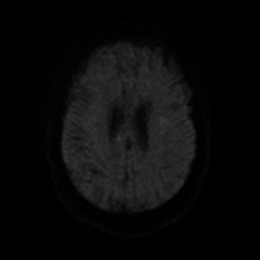
[im 75/100]
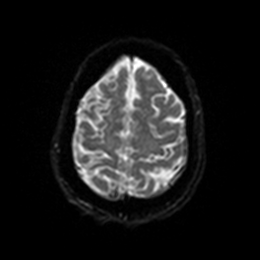
[im 87/100]
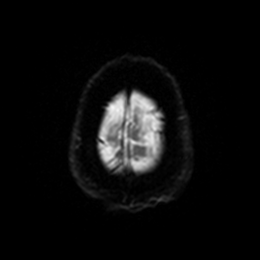
[im 100/100]
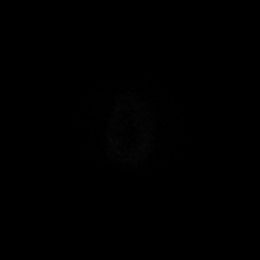

[Series 6: DWI · axial · 3.0mm · 0.88mm/px · z∈[-57,+85]mm · 4 of 50 slices shown (2 of 4)]
[im 1/50]
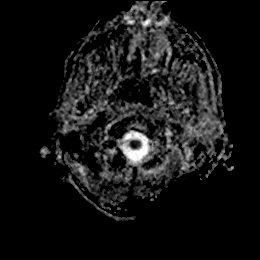
[im 17/50]
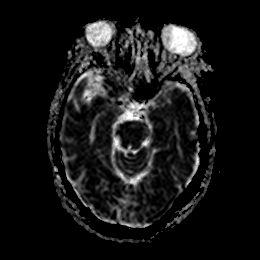
[im 33/50]
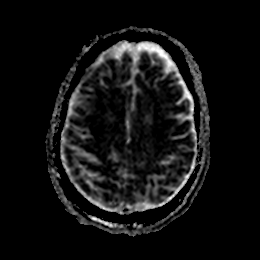
[im 50/50]
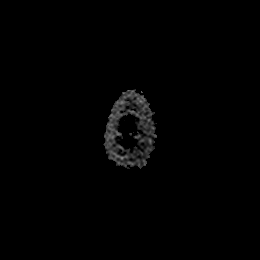

[Series 7: DWI · coronal · 4.0mm · 0.88mm/px · 6 of 66 slices shown (3 of 4)]
[im 1/66]
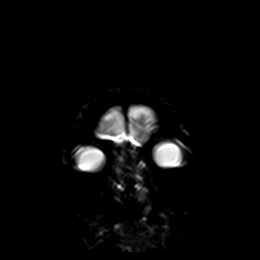
[im 14/66]
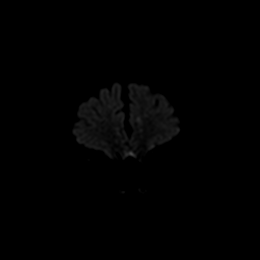
[im 27/66]
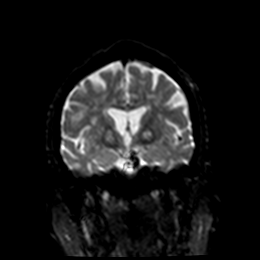
[im 40/66]
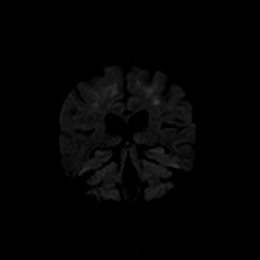
[im 53/66]
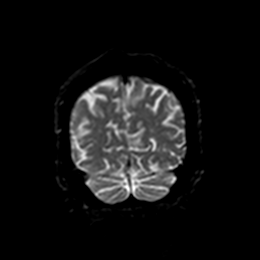
[im 66/66]
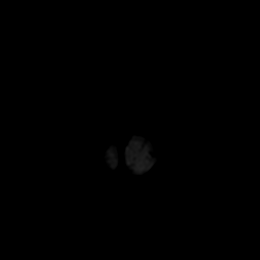

[Series 8: DWI · coronal · 4.0mm · 0.88mm/px · 3 of 33 slices shown (4 of 4)]
[im 1/33]
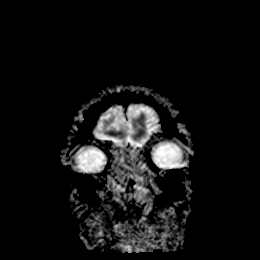
[im 17/33]
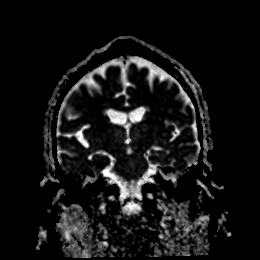
[im 33/33]
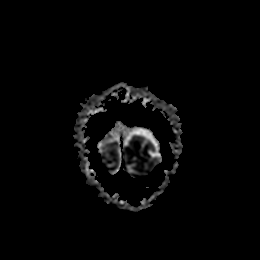

[Series 9: T1 · sagittal · 5.0mm · 0.75mm/px · 2 of 23 slices shown]
[im 1/23]
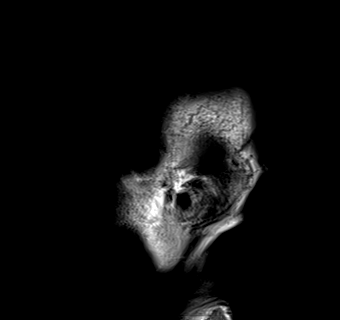
[im 23/23]
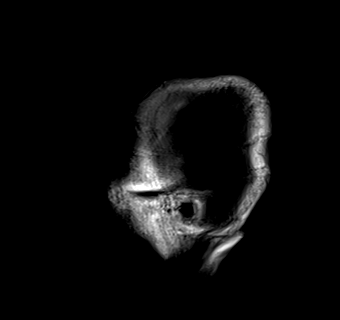

[Series 10: T2 · axial · 5.0mm · 0.72mm/px · z∈[-55,+84]mm · 2 of 25 slices shown (1 of 2)]
[im 1/25]
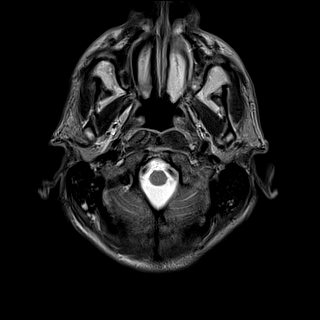
[im 25/25]
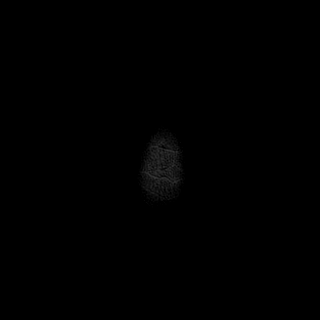

[Series 11: FLAIR · axial · 5.0mm · 0.45mm/px · z∈[-57,+82]mm · 2 of 25 slices shown (1 of 2)]
[im 1/25]
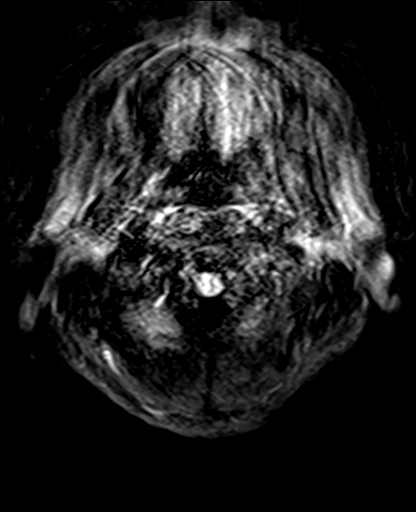
[im 25/25]
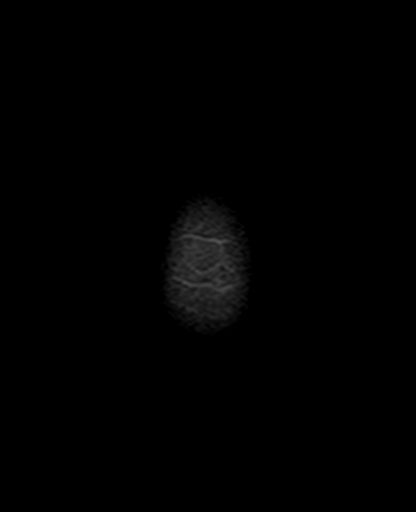

[Series 13: pha_images · axial · 3.0mm · 0.90mm/px · z∈[-73,+78]mm · 5 of 52 slices shown]
[im 1/52]
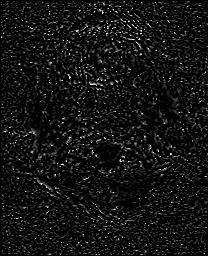
[im 13/52]
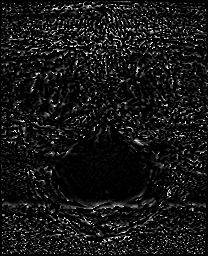
[im 26/52]
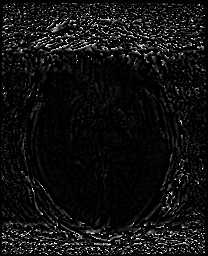
[im 39/52]
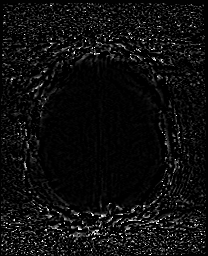
[im 52/52]
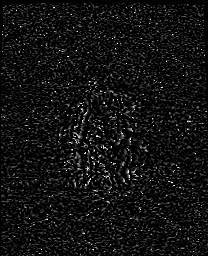

[Series 14: swi_images · axial · 3.0mm · 0.90mm/px · z∈[-76,+95]mm · 5 of 60 slices shown]
[im 1/60]
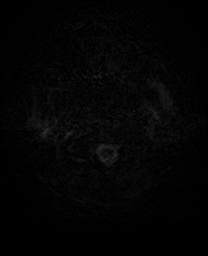
[im 15/60]
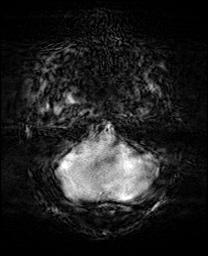
[im 30/60]
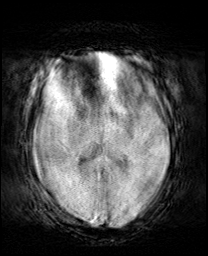
[im 45/60]
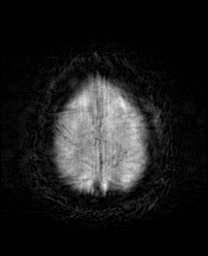
[im 60/60]
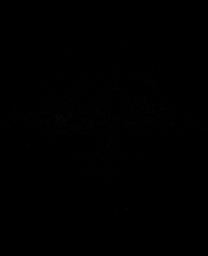

[Series 16: FLAIR · axial · 5.0mm · 0.45mm/px · z∈[-57,+82]mm · 2 of 25 slices shown (2 of 2)]
[im 1/25]
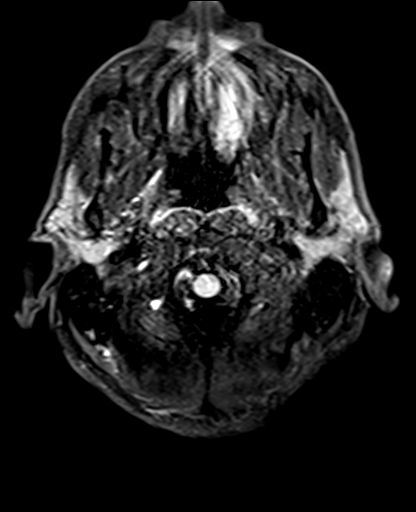
[im 25/25]
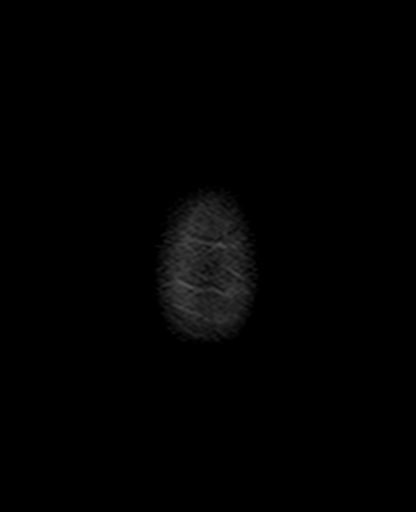

[Series 17: T2 · coronal · 5.0mm · 0.34mm/px · 3 of 29 slices shown (2 of 2)]
[im 1/29]
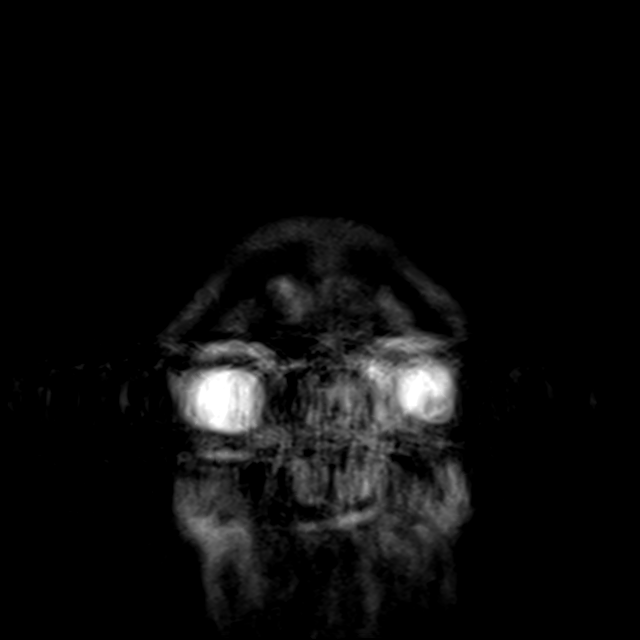
[im 15/29]
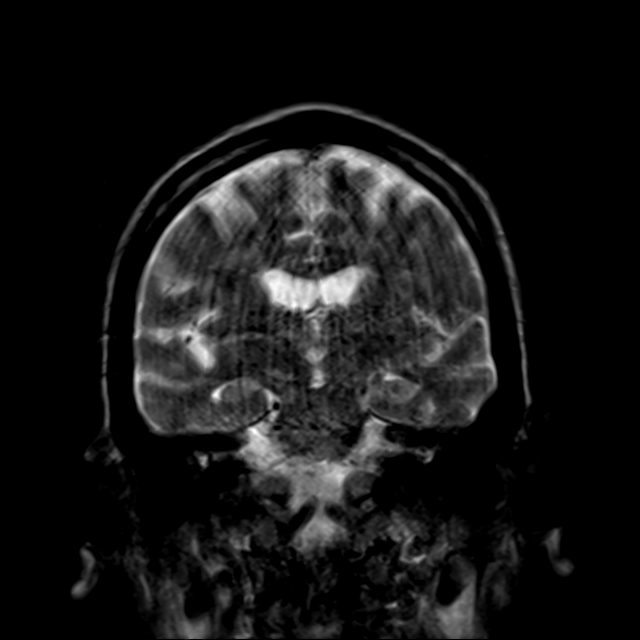
[im 29/29]
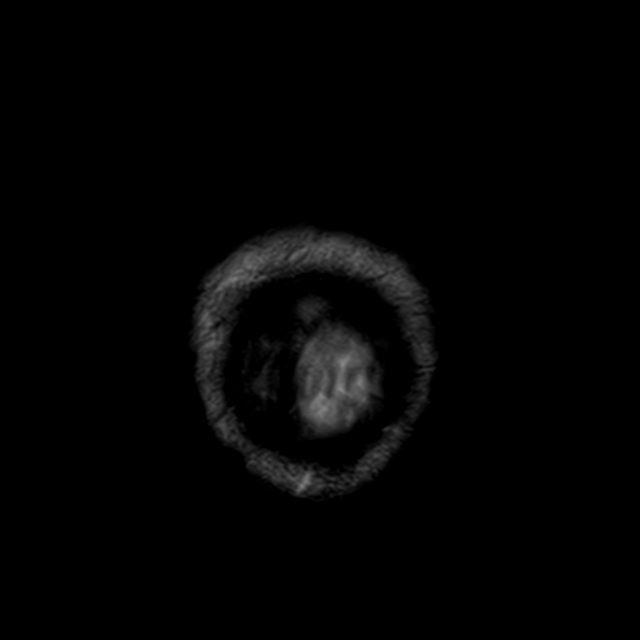

[43 of 48 positions shown; findings below may reference images not displayed]

FINDINGS: Brain: Prior study demonstrated multiple areas of acute infarction
which show interval improvement. No new area of acute infarct is
identified. Multiple diffusion hyperintensities in the deep white
matter [DATE] shine through from recent infarct. Recent
infarct also in the globus pallidus bilaterally and right occipital
lobe and left hippocampus.

Small amount of petechial hemorrhage in the right occipital infarct
best seen on the prior study. Ventricle size normal.

Image quality degraded by motion

Vascular: Normal arterial flow voids

Skull and upper cervical spine: Negative

Sinuses/Orbits: Mucosal edema paranasal sinuses.  Negative orbit

Other: None
IMPRESSION: Multiple areas of acute infarct on the MRI [DATE] show
improvement. No new area of acute infarct is identified.

Motion degraded study.

## 2020-11-30 NOTE — Progress Notes (Signed)
  Speech Language Pathology Treatment: Dysphagia  Patient Details Name: [name] MRN: [id number] DOB: 25-Apr-1956 Today's Date: 11/30/2020 Time: 7619-5093 SLP Time Calculation (min) (ACUTE ONLY): 9 min  Assessment / Plan / Recommendation Clinical Impression  Pt demonstrates stable swallowing ability. He was lethargic, but able to self feed with some verbal instruction. He does not have the ability to reposition himself, self feed a full meal or follow aspiration precautions without assist. When asked if he uses straws and shown a straw, he nodded yes. SLP reinforced precaution and rationale for avoid ing straws. No straws were seen around the room. Pt also tolerated his DYs 2 meal tray this am. SLP offered sips in a fully upright position from a cup with no signs of aspiration, also trialed sips in a partially reclined position, with pt managing his own rate. Again, no signs of aspiration; only some mild anterior spill on the left due to lean. Recommend pt continue current diet and precautions - dys 2/thin, no straws, full supervision, upright posture. No further SLP interventions are needed as diet and precautions are appropriate.   HPI HPI: [name] is a 65 y.o. 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 He was found to be encephalopathic at attended with acute renal failure and rhabdomyolysis.  He also has new bullous skin lesions on hands and legs. Pt was found to have multiple infarcts and anoxic changes on MRI 4/12.  CXR 4/12 with no acute findings. MBS 4/16: mild oropharyngeal dysphagia c/b prolonged mastication, premature spillage, delayed swallow initiation, and reduced base of tongue retraction.  These deficits resulted in trace, transient penetration of thin liquid by straw prior to the swallow. Pt was last seen by SLP on 4/22 and was  tolerating a dysphagia 3 diet and thin liquids at that time. Rapid response on 4/22 due to hypotension and bloody stools. Pt was hard to arouse at that time and only opened his eyes to painful stimuli. GI was consulted 4/22 secondary to rectal pain and bleeding and rectal bleeding was thought to possibly be from radiation radiation proctitis given hx of anal cancer. Flexible sigmoidoscopy on 4/23; Decubitus ulceration, perianal rash,multiple ulcers in the rectum. Biopsies done and Palliative Care consult recommended for [place].      SLP Plan  All goals met       Recommendations  Diet recommendations: Dysphagia 2 (fine chop);Thin liquid Liquids provided via: Cup;No straw Medication Administration: Whole meds with puree Supervision: Staff to assist with self feeding Compensations: Slow rate;Small sips/bites;Minimize environmental distractions Postural Changes and/or Swallow Maneuvers: Seated upright 90 degrees                Plan: All goals met       GO               Herbie Baltimore, MA CCC-SLP  Acute Rehabilitation Services Pager [phone] Office [phone]  Lynann Beaver 11/30/2020, 9:03 AM

## 2020-11-30 NOTE — Plan of Care (Signed)
  Problem: Activity: Goal: Risk for activity intolerance will decrease Outcome: Progressing   Problem: Coping: Goal: Level of anxiety will decrease Outcome: Progressing   

## 2020-11-30 NOTE — Progress Notes (Addendum)
Subjective:   No acute overnight events.  Mr. Pablo is difficult to awaken this AM and only opens eyes to noxious stimuli but quickly closes them again. He is not following commands today and is nonverbal. Pupils are equal and sluggishly reactive to light.   Went to see Mr. Ghee again this afternoon and his exam is unchanged from this morning.  Objective:  Vital signs in last 24 hours: Vitals:   11/29/20 0839 11/29/20 1718 11/29/20 2218 11/30/20 0506  BP: 123/65 (!) 127/56 (!) 141/71 136/68  Pulse: 91 (!) 103 94 87  Resp: 18 20 18 18   Temp: 98.4 F (36.9 C) 99.7 F (37.6 C) 99.9 F (37.7 C) 99 F (37.2 C)  TempSrc: Oral Oral Oral Oral  SpO2: 100% 98% 100% 100%  Weight:      Height:       General: elderly male, ill-appearing, lying in bed, NAD. Eyes: pupils equal and sluggishly reactive to light. No scleral icterus noted. Cardiovascular: normal rate and regular rhythm, no m/r/g. Pulmonary: clear to auscultation bilaterally, no adventitious sounds noted. Abdomen: soft, nondistended. No grimacing noted to superficial or deep palpation of abdomen. + bowel sounds. Neuro: opens eyes only to noxious stimuli. Unable to perform complete neurological exam as patient is not following commands. Upper extremities with increased tone (R>L) unchanged from yesterday.  Assessment/Plan:  Principal Problem:   Anoxic brain injury ([place]) Active Problems:   Acute encephalopathy   Pressure injury of skin   Acute renal failure (HCC)   Traumatic rhabdomyolysis (HCC)   Bullous skin disease   Multiple cerebral infarctions (HCC)   Rectal cancer (HCC)   Acute combined systolic and diastolic heart failure (HCC)   Hyperkalemia   Tongue lesion   Protein-calorie malnutrition, severe   Hemorrhagic shock (HCC)  Zackaria Burkey is a 65 year old man with a history of AIDS and substance use admitted with acute encephalopathy found to have numerous new acute/subacute ischemic infarcts on MRI Brain.  Hospital course complicated by hemorrhagic shock secondary to LGIB requiring brief stay in ICU. He is now hemodynamically stable and transferred back to IMTS.   Acute encephalopathy Multiple acute/subacute ischemic infarcts Hypoxic ischemic injury  Mr. Bonczek's encephalopathy appears to have worsened since prior to ICU admission, nonverbal now and opening eyes only to noxious stimuli. Upon SLP evaluation, patient was able to demonstrate stable swallowing ability and self-feed with verbal instruction so unsure if patient's encephalopathy has worsened or if he was uncooperative during my encounter. At this time, unable to determine how much improvement he will have in terms of mental status.  Spoke with brother, [name], in terms of planning for a family meeting this week for goals of care discussions and possibly initiating palliation. [name] will speak with other siblings to decide on a good time for everyone. -Will repeat MRI brain wo contrast to evaluate for evolving anoxic brain injury and for further prognostication -Continue holding DAPT in setting of acute lower GI bleed -Continue statin -Continue PT/OT -Dysphagia 2 diet per SLP -Plan for family meeting this week for [place] discussions  Acute LGIB complicated by hemorrhagic shock, resolved Multiple rectal ulcers  No further episodes of hematochezia noted by nursing. Bedside flex sig showed multiple large ulcers.  Biopsy pathology report from rectal ulcers with nonspecific findings although with focal changes suggestive of ischemia.  Spoke with Dr. Henrene Pastor, GI, about pathology findings.  Advised that treatment is supportive and that patient's rectal changes should heal over time as long as hemorrhagic shock has  resolved, greatly appreciate advice. 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

## 2020-12-01 DIAGNOSIS — E43 Unspecified severe protein-calorie malnutrition: Secondary | ICD-10-CM

## 2020-12-01 LAB — CBC WITH DIFFERENTIAL/PLATELET
Abs Immature Granulocytes: 0.29 10*3/uL — ABNORMAL HIGH (ref 0.00–0.07)
Basophils Absolute: 0.1 10*3/uL (ref 0.0–0.1)
Basophils Relative: 1 %
Eosinophils Absolute: 0.2 10*3/uL (ref 0.0–0.5)
Eosinophils Relative: 1 %
HCT: 24.3 % — ABNORMAL LOW (ref 39.0–52.0)
Hemoglobin: 7.9 g/dL — ABNORMAL LOW (ref 13.0–17.0)
Immature Granulocytes: 3 %
Lymphocytes Relative: 15 %
Lymphs Abs: 1.8 10*3/uL (ref 0.7–4.0)
MCH: 29 pg (ref 26.0–34.0)
MCHC: 32.5 g/dL (ref 30.0–36.0)
MCV: 89.3 fL (ref 80.0–100.0)
Monocytes Absolute: 1.2 10*3/uL — ABNORMAL HIGH (ref 0.1–1.0)
Monocytes Relative: 10 %
Neutro Abs: 8.3 10*3/uL — ABNORMAL HIGH (ref 1.7–7.7)
Neutrophils Relative %: 70 %
Platelets: 506 10*3/uL — ABNORMAL HIGH (ref 150–400)
RBC: 2.72 MIL/uL — ABNORMAL LOW (ref 4.22–5.81)
RDW: 14.5 % (ref 11.5–15.5)
WBC: 11.8 10*3/uL — ABNORMAL HIGH (ref 4.0–10.5)
nRBC: 0 % (ref 0.0–0.2)

## 2020-12-01 LAB — BASIC METABOLIC PANEL
Anion gap: 9 (ref 5–15)
BUN: 10 mg/dL (ref 8–23)
CO2: 23 mmol/L (ref 22–32)
Calcium: 8.2 mg/dL — ABNORMAL LOW (ref 8.9–10.3)
Chloride: 104 mmol/L (ref 98–111)
Creatinine, Ser: 0.84 mg/dL (ref 0.61–1.24)
GFR, Estimated: >60 mL/min (ref >60)
Glucose, Bld: 82 mg/dL (ref 70–99)
Potassium: 3.8 mmol/L (ref 3.5–5.1)
Sodium: 136 mmol/L (ref 135–145)

## 2020-12-01 NOTE — Progress Notes (Addendum)
   Subjective:   No acute overnight events.  More interactive this AM. Reports feeling so-so. Denies any pain or diarrhea. Able to tell us he is at [facility]. When asked if he wanted to go home, made an "okay" sign.  Spoke with brother, [name], and made plan for family meeting this Friday at [facility].  Objective:  Vital signs in last 24 hours: Vitals:   11/30/20 1043 11/30/20 1646 11/30/20 2016 12/01/20 0428  BP: 111/61 131/62 110/62 127/76  Pulse: 85 100 98 79  Resp: 16 18 20 18   Temp: 98.8 F (37.1 C) 99.7 F (37.6 C) 99.4 F (37.4 C) 98 F (36.7 C)  TempSrc: Oral Oral Oral Oral  SpO2: 99% 97% 98% 95%  Weight:      Height:       General: elderly male, ill-appearing, lying in bed, NAD. CV: normal rate and regular rhythm, no m/r/g. Pulmonary: CTABL, unlabored respiratory effort. Neuro: Alert and oriented to person and place, improved from yesterday. Moving extremities on command. Skin: warm and dry.  Assessment/Plan:  Principal Problem:   Anoxic brain injury ([place]) Active Problems:   Acute encephalopathy   Pressure injury of skin   Acute renal failure (HCC)   Traumatic rhabdomyolysis (HCC)   Bullous skin disease   Multiple cerebral infarctions (HCC)   Rectal cancer (HCC)   Acute combined systolic and diastolic heart failure (HCC)   Hyperkalemia   Tongue lesion   Protein-calorie malnutrition, severe   Hemorrhagic shock (HCC)  [name] is a 65 year old man with a history of AIDS and substance use admitted with acute encephalopathy found to have numerous new acute/subacute ischemic infarcts on MRI Brain. Hospital course complicated by hemorrhagic shock secondary to LGIB requiring brief stay in ICU. He is now hemodynamically stable and transferred back to IMTS.   Acute encephalopathy Multiple acute/subacute ischemic infarcts - improving on imaging Hypoxic ischemic injury  MRI brain yesterday showing no new infarcts and improvement in prior infarcts with no worsening  of anoxic brain injury noted. Improvement in cognition today. However, patient still does not have decision-making capacity. Family meeting planned for this Friday at [facility]. Palliative care consulted, greatly appreciate assistance. TOC consulted for SNF placement as recommended by PT/OT, greatly appreciate assistance. -continue holding DAPT today given recent hemorrhagic shock from LGIB -continue statin -continue PT/OT -consulted SLP again to reevaluate swallowing ability in attempts to liberalize diet  Acute LGIB complicated by hemorrhagic shock, resolved Multiple rectal ulcers 2/2 ischemia LGIB and rectal ulcerations likely 2/2 hypoperfusion/ischemia. Will continue supportive care with mesalamine and hydrocortisone suppositories per GI recommendation. No further episodes of hematochezia noted. 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

## 2020-12-01 NOTE — Progress Notes (Signed)
Physical Therapy Treatment Patient Details Name: [name] MRN: [id number] DOB: 02/16/1956 Today's Date: 12/01/2020    History of Present Illness [name] is a 65 y.o. male who reported to ED with AMS. Admitted on 11/16/20 with anoxic brain injury and acute encephalopathy. 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    PT Comments    Pt received in bed, slow to respond. Total Ax2 for bed mobility. Session focused on tolerance for sitting at EOB and completing tasks. Pt able to sit for 10-12 min with max Ax2 due to L lateral lean. Assistance needed to initiate most tasks, but pt noted to be reaching for his cup of water and was able to move objects around on his table with cueing. Increased truncal tone and in R UE noted. Plan to complete tone management prior to sitting on EOB to see if sitting balance/posture improves. Will continue to follow acutely.    Follow Up Recommendations  SNF     Equipment Recommendations  Wheelchair (measurements PT);Wheelchair cushion (measurements PT);Hospital bed (mechanical lift)    Recommendations for Other Services       Precautions / Restrictions Precautions Precautions: Fall Precaution Comments: found down, skin integrity Restrictions Weight Bearing Restrictions: No    Mobility  Bed Mobility Overal bed mobility: Needs Assistance Bed Mobility: Supine to Sit;Sit to Supine;Rolling Rolling: Total assist;+2 for physical assistance   Supine to sit: Total assist;+2 for physical assistance Sit to supine: Total assist;+2 for physical assistance;+2 for safety/equipment   General bed mobility comments: rolling for pericare    Transfers                 General transfer comment: unable to attempt 2/2 zero sitting balance  Ambulation/Gait                 Stairs             Wheelchair Mobility    Modified Rankin (Stroke Patients  Only)       Balance Overall balance assessment: Needs assistance Sitting-balance support: Feet supported;Bilateral upper extremity supported Sitting balance-Leahy Scale: Zero Sitting balance - Comments: max A +2 for sitting balance Postural control: Left lateral lean                                  Cognition Arousal/Alertness: Awake/alert Behavior During Therapy: Flat affect Overall Cognitive Status: Difficult to assess Area of Impairment: Following commands;Problem solving;Attention;Awareness                   Current Attention Level: Focused   Following Commands: Follows one step commands inconsistently;Follows one step commands with increased time   Awareness: Emergent Problem Solving: Slow processing;Requires verbal cues;Requires tactile cues;Difficulty sequencing;Decreased initiation General Comments: occassional one-word response in very soft tone, would move eyes in response to touch      Exercises Other Exercises Other Exercises: truncal rotation/stretching Other Exercises: reaching for objects on table while sitting at EOB Other Exercises: Lowering onto R forearm while sitting at EOB, x5    General Comments        Pertinent Vitals/Pain Pain Assessment: Faces Faces Pain Scale: Hurts a little bit Pain Location: difficult to assess, flat affect. nodded slightly when asked if he had pain, pointed to lower midsection (groin/rectum?) when asked where Pain Intervention(s): Monitored during session    Home Living  Prior Function            PT Goals (current goals can now be found in the care plan section) Acute Rehab PT Goals Patient Stated Goal: none stated    Frequency    Min 3X/week      PT Plan Current plan remains appropriate    Co-evaluation PT/OT/SLP Co-Evaluation/Treatment: Yes Reason for Co-Treatment: Complexity of the patient's impairments (multi-system involvement);For patient/therapist  safety;To address functional/ADL transfers PT goals addressed during session: Mobility/safety with mobility;Balance OT goals addressed during session: ADL's and self-care;Strengthening/ROM      AM-PAC PT "6 Clicks" Mobility   Outcome Measure  Help needed turning from your back to your side while in a flat bed without using bedrails?: Total Help needed moving from lying on your back to sitting on the side of a flat bed without using bedrails?: Total Help needed moving to and from a bed to a chair (including a wheelchair)?: Total Help needed standing up from a chair using your arms (e.g., wheelchair or bedside chair)?: Total Help needed to walk in hospital room?: Total Help needed climbing 3-5 steps with a railing? : Total 6 Click Score: 6    End of Session   Activity Tolerance: Patient tolerated treatment well Patient left: in bed;with call bell/phone within reach;with bed alarm set;Other (comment) (Prevalon boots applied) Nurse Communication: Mobility status;Need for lift equipment PT Visit Diagnosis: Unsteadiness on feet (R26.81);Other abnormalities of gait and mobility (R26.89);Repeated falls (R29.6);Muscle weakness (generalized) (M62.81);History of falling (Z91.81)     Time:  -     Charges:                        Rosita Kea, SPT

## 2020-12-01 NOTE — Progress Notes (Signed)
Occupational Therapy Treatment Patient Details Name: [name] MRN: [id number] DOB: 1956/07/05 Today's Date: 12/01/2020    History of present illness [name] is a 65 y.o. 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 Able to tolerate sitting EOB for about 15 minutes with +2 max A needed for sitting balance. Worked on trunk rotation while attempting ADL tasks seated EOB. Using left hand pt able to bring washcloth to face and wipe eyes. He also was able to pick up small condiment container with extra time/effort and modified grasp to place in small bowl. Pt with heavy L lateral lean and noted to keep RUE in forearm and wrist supine position seated EOB. He was able to complete a few AAROM/PROM RUE elbow and distal in gravity eliminated position. Difficulty coming into exstention from elbow flexed position. Increased tone noted throughout pt's body (cervical, trunk, RUE>LUE, BLE). Cough and additional throat clearing observed while drinking water from a cup. D/c plan remains appropriate.     Follow Up Recommendations  SNF;Supervision/Assistance - 24 hour    Equipment Recommendations  Wheelchair (measurements OT);Wheelchair cushion (measurements OT);Hospital bed;Other (comment) (defer to next venue)    Recommendations for Other Services      Precautions / Restrictions Precautions Precautions: Fall Precaution Comments: found down, skin integrity Restrictions Weight Bearing Restrictions: No       Mobility Bed Mobility Overal bed mobility: Needs Assistance Bed Mobility: Supine to Sit;Sit to Supine     Supine to sit: Total assist;+2 for physical assistance Sit to supine: Total assist;+2 for physical assistance;+2 for safety/equipment   General bed mobility comments: Total A x  2 needed for all bed mobility with poor initiation noted    Transfers                 General transfer comment: unable to attempt 2/2 zero sitting balance    Balance Overall balance assessment: Needs assistance Sitting-balance support: Feet supported;Bilateral upper extremity supported Sitting balance-Leahy Scale: Zero Sitting balance - Comments: max A +2 for sitting balance 2/2 left lateral lean. +2 for safety with static balance, +2 physical assist with dynamic tasks. Postural control: Left lateral lean                                 ADL either performed or assessed with clinical judgement   ADL Overall ADL's : Needs assistance/impaired Eating/Feeding: Set up;Supervision/ safety;Bed level Eating/Feeding Details (indicate cue type and reason): bed level vs full supported seating. Able to reach for cup with left hand, grasp and bring to mouth to drink. Pt attempted to drink from styrofoam cup with lid still on. OT drew pt's attention to this and OT removed lid. Grooming: Maximal assistance;Wash/dry face;Sitting Grooming Details (indicate cue type and reason): Max A for truncal support 2/2 left lateral lean sitting EOB. With extra time, verbal cueing and setup, pt able to bring wash cloth to face to clean eyes utilizing L hand. Would need supoprted sitting vs bed level to complete full grooming session                               General ADL Comments: Pt completed rolling to each  side with total A +2. Sat EOB about 15 min with max A +2 for safety and some physical assist during dynamic tasks. Worked on trunk rotation, passive scapular movement (PROM, significant tone noted) EOB. 1 brief grooming tasks while EOB. Left, lateral lean.     Vision   Additional Comments: unable to assess 2/2 AMS. Question if pt has some periphery deficits.   Perception     Praxis      Cognition Arousal/Alertness: Awake/alert Behavior During Therapy: Flat  affect Overall Cognitive Status: Difficult to assess Area of Impairment: Following commands;Problem solving;Attention;Awareness                   Current Attention Level: Focused   Following Commands: Follows one step commands inconsistently;Follows one step commands with increased time   Awareness: Intellectual Problem Solving: Slow processing;Requires verbal cues;Requires tactile cues;Difficulty sequencing;Decreased initiation General Comments: inconsistent one step command following. possible motor perseveration (vs volition to continue RUE AAROM exercises?). soft, one word response a couple of times otherwise responses given as nods or head turns. Difficult to isolate what is purely acute cognitive deficits especially since pt is currently largely nonverbal and inconsistent in reponses.        Exercises Other Exercises Other Exercises: truncal rotation/stretching Other Exercises: AAROM/PROM R elbow and distal, attempted PROM of scapula with very limited succes while seated EOB   Shoulder Instructions       General Comments      Pertinent Vitals/ Pain       Pain Assessment: Faces Faces Pain Scale: Hurts a little bit Pain Location: difficult to assess 2/2 AMS, flat affect. nodded slightly when asked if he had pain, pointed to lower midsection (groin/rectum?) when asked where Pain Intervention(s): Monitored during session;Repositioned  Home Living                                          Prior Functioning/Environment              Frequency  Min 2X/week        Progress Toward Goals  OT Goals(current goals can now be found in the care plan section)  Progress towards OT goals: Progressing toward goals  Acute Rehab OT Goals Patient Stated Goal: none stated OT Goal Formulation: Patient unable to participate in goal setting Time For Goal Achievement: 12/06/20 Potential to Achieve Goals: Good ADL Goals Pt Will Perform Grooming: with  supervision;with set-up;sitting Pt Will Perform Upper Body Bathing: with mod assist;with min assist;sitting Pt Will Perform Upper Body Dressing: with mod assist;with min assist;sitting Pt Will Transfer to Toilet: with max assist;stand pivot transfer;bedside commode Additional ADL Goal #1: Pt will feed self with 1-2 verbal cues for safety to swallow before next bite and to not spill drinks Additional ADL Goal #2: Pt will follow 1-2 commnads with min verbal and phsyical cues  Plan Discharge plan remains appropriate    Co-evaluation    PT/OT/SLP Co-Evaluation/Treatment: Yes Reason for Co-Treatment: Complexity of the patient's impairments (multi-system involvement);For patient/therapist safety;To address functional/ADL transfers   OT goals addressed during session: ADL's and self-care;Strengthening/ROM      AM-PAC OT "6 Clicks" Daily Activity     Outcome Measure   Help from another person eating meals?: A Lot Help from another person taking care of personal grooming?: A Lot Help from another person toileting, which includes using toliet, bedpan, or urinal?: Total Help  from another person bathing (including washing, rinsing, drying)?: Total Help from another person to put on and taking off regular upper body clothing?: Total Help from another person to put on and taking off regular lower body clothing?: Total 6 Click Score: 8    End of Session    OT Visit Diagnosis: Unsteadiness on feet (R26.81);Other abnormalities of gait and mobility (R26.89);Muscle weakness (generalized) (M62.81);History of falling (Z91.81);Other symptoms and signs involving cognitive function;Cognitive communication deficit (R41.841)   Activity Tolerance Patient limited by fatigue   Patient Left in bed;with call bell/phone within reach;Other (comment) (Prevalon boots back on.)   Nurse Communication          Time: [id number] OT Time Calculation (min): 40 min  Charges: OT General Charges $OT Visit: 1  Visit OT Treatments $Self Care/Home Management : 8-22 mins  Tyrone Schimke, OT Acute Rehabilitation Services Pager: [phone] Office: [phone]    Hortencia Pilar 12/01/2020, 12:21 PM

## 2020-12-02 ENCOUNTER — Other Ambulatory Visit (HOSPITAL_COMMUNITY): Payer: Self-pay

## 2020-12-02 DIAGNOSIS — K626 Ulcer of anus and rectum: Secondary | ICD-10-CM

## 2020-12-02 LAB — CBC WITH DIFFERENTIAL/PLATELET
Abs Immature Granulocytes: 0.19 10*3/uL — ABNORMAL HIGH (ref 0.00–0.07)
Basophils Absolute: 0.1 10*3/uL (ref 0.0–0.1)
Basophils Relative: 1 %
Eosinophils Absolute: 0.2 10*3/uL (ref 0.0–0.5)
Eosinophils Relative: 2 %
HCT: 24.8 % — ABNORMAL LOW (ref 39.0–52.0)
Hemoglobin: 8 g/dL — ABNORMAL LOW (ref 13.0–17.0)
Immature Granulocytes: 2 %
Lymphocytes Relative: 15 %
Lymphs Abs: 1.7 10*3/uL (ref 0.7–4.0)
MCH: 28.9 pg (ref 26.0–34.0)
MCHC: 32.3 g/dL (ref 30.0–36.0)
MCV: 89.5 fL (ref 80.0–100.0)
Monocytes Absolute: 1.1 10*3/uL — ABNORMAL HIGH (ref 0.1–1.0)
Monocytes Relative: 10 %
Neutro Abs: 8.1 10*3/uL — ABNORMAL HIGH (ref 1.7–7.7)
Neutrophils Relative %: 70 %
Platelets: 526 10*3/uL — ABNORMAL HIGH (ref 150–400)
RBC: 2.77 MIL/uL — ABNORMAL LOW (ref 4.22–5.81)
RDW: 14.1 % (ref 11.5–15.5)
WBC: 11.4 10*3/uL — ABNORMAL HIGH (ref 4.0–10.5)
nRBC: 0 % (ref 0.0–0.2)

## 2020-12-02 LAB — BASIC METABOLIC PANEL
Anion gap: 11 (ref 5–15)
BUN: 9 mg/dL (ref 8–23)
CO2: 22 mmol/L (ref 22–32)
Calcium: 8.3 mg/dL — ABNORMAL LOW (ref 8.9–10.3)
Chloride: 100 mmol/L (ref 98–111)
Creatinine, Ser: 0.8 mg/dL (ref 0.61–1.24)
GFR, Estimated: >60 mL/min (ref >60)
Glucose, Bld: 91 mg/dL (ref 70–99)
Potassium: 3.6 mmol/L (ref 3.5–5.1)
Sodium: 133 mmol/L — ABNORMAL LOW (ref 135–145)

## 2020-12-02 LAB — GLUCOSE, CAPILLARY: Glucose-Capillary: 100 mg/dL — ABNORMAL HIGH (ref 70–99)

## 2020-12-02 MED ORDER — LOPERAMIDE HCL 2 MG PO CAPS
2.0000 mg | ORAL_CAPSULE | Freq: Once | ORAL | Status: AC
  Administered 2020-12-02: 2 mg via ORAL
  Filled 2020-12-02: qty 1

## 2020-12-02 MED ORDER — LOPERAMIDE HCL 2 MG PO CAPS
2.0000 mg | ORAL_CAPSULE | ORAL | Status: DC | PRN
  Administered 2020-12-05: 2 mg via ORAL
  Filled 2020-12-02: qty 1

## 2020-12-02 MED ORDER — ASPIRIN EC 81 MG PO TBEC
81.0000 mg | DELAYED_RELEASE_TABLET | Freq: Every day | ORAL | Status: DC
  Administered 2020-12-02 – 2020-12-09 (×8): 81 mg via ORAL
  Filled 2020-12-02 (×8): qty 1

## 2020-12-02 NOTE — Plan of Care (Signed)
  Problem: Education: Goal: Knowledge of General Education information will improve Description: Including pain rating scale, medication(s)/side effects and non-pharmacologic comfort measures Outcome: Progressing   Problem: Health Behavior/Discharge Planning: Goal: Ability to manage health-related needs will improve Outcome: Progressing   Problem: Education: Goal: Knowledge of General Education information will improve Description: Including pain rating scale, medication(s)/side effects and non-pharmacologic comfort measures 12/02/2020 0122 by Kaylyn Lim, RN Outcome: Not Progressing 12/02/2020 0122 by Kaylyn Lim, RN Outcome: Progressing   Problem: Health Behavior/Discharge Planning: Goal: Ability to manage health-related needs will improve Outcome: Progressing   Problem: Clinical Measurements: Goal: Ability to maintain clinical measurements within normal limits will improve Outcome: Progressing Goal: Will remain free from infection 12/02/2020 0122 by Kaylyn Lim, RN Outcome: Not Progressing 12/02/2020 0122 by Kaylyn Lim, RN Outcome: Progressing Goal: Diagnostic test results will improve 12/02/2020 0122 by Kaylyn Lim, RN Outcome: Not Progressing 12/02/2020 0122 by Kaylyn Lim, RN Outcome: Progressing Goal: Respiratory complications will improve Outcome: Progressing Goal: Cardiovascular complication will be avoided Outcome: Progressing   Problem: Activity: Goal: Risk for activity intolerance will decrease Outcome: Progressing   Problem: Nutrition: Goal: Adequate nutrition will be maintained Outcome: Progressing   Problem: Coping: Goal: Level of anxiety will decrease Outcome: Progressing   Problem: Elimination: Goal: Will not experience complications related to bowel motility 12/02/2020 0122 by Kaylyn Lim, RN Outcome: Not Progressing 12/02/2020 0122 by Kaylyn Lim, RN Outcome: Progressing Goal: Will not experience complications related to urinary  retention Outcome: Progressing   Problem: Pain Managment: Goal: General experience of comfort will improve 12/02/2020 0122 by Kaylyn Lim, RN Outcome: Not Progressing 12/02/2020 0122 by Kaylyn Lim, RN Outcome: Progressing   Problem: Safety: Goal: Ability to remain free from injury will improve Outcome: Progressing   Problem: Skin Integrity: Goal: Risk for impaired skin integrity will decrease 12/02/2020 0122 by Kaylyn Lim, RN Outcome: Not Progressing 12/02/2020 0122 by Kaylyn Lim, RN Outcome: Progressing   Problem: Education: Goal: Knowledge of disease or condition will improve 12/02/2020 0122 by Kaylyn Lim, RN Outcome: Not Progressing 12/02/2020 0122 by Kaylyn Lim, RN Outcome: Progressing Goal: Knowledge of secondary prevention will improve Outcome: Progressing Goal: Knowledge of patient specific risk factors addressed and post discharge goals established will improve Outcome: Progressing Goal: Individualized Educational Video(s) Outcome: Progressing

## 2020-12-02 NOTE — Progress Notes (Signed)
Nutrition Follow-up  DOCUMENTATION CODES:   Severe malnutrition in context of chronic illness  INTERVENTION:    Continue Ensure Enlive po TID, each supplement provides 350 kcal and 20 grams of protein.  Continue MVI with minerals daily.  Continue Magic cup TID with meals, each supplement provides 290 kcal and 9 grams of protein  NUTRITION DIAGNOSIS:   Severe Malnutrition related to chronic illness (AIDS, hepatitis C, alcohol & drug abuse) as evidenced by severe muscle depletion,severe fat depletion.  Ongoing   GOAL:   Patient will meet greater than or equal to 90% of their needs  Progressing   MONITOR:   PO intake,Supplement acceptance,Labs  REASON FOR ASSESSMENT:   Consult Enteral/tube feeding initiation and management  ASSESSMENT:   Pt admitted with acute metabolic encephalopathy 2/2 multiple acute/subacute ischemic infarcts. 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   4/13 - admitted to IMTS for AMS 4/14 - cards consult for TEE 4/15 - s/p TEE; cortrak placed 4/18 - cortrak removed 4/20 - diet advanced to dysphagia 3 with thin liquids s/p BSE 4/21 - CCS consult for GIB, rec GI consult 4/22 - GI assessed, continued bleeding and new hypotension, PCCM consulted 4/23 - VIR angiogram without target, no intervention, s/p flex sigmoidoscopy with rectal ulcers, unable to intervene 4/24 - diet downgraded to dysphagia 2 with thin liquids s/p BSE  Pt sleeping at time of RD visit. Per MD, pt is stable and ready for discharge to SNF pending family approval given pt does not have decision-making capacity.  PMT consult pending.  Pt's diet advanced to dysphagia 3 with thin liquids today s/p BSE. Per RN, pt has been doing fairly well with meals and supplements. Receiving Ensure and Magic Cup TID. Last 8 meal completions charted as 50-85% (66% average meal intake). Continue current nutrition plan of  care.   Admit wt 60.8 kg Current wt 60.2 kg  UOP: 1657ml x24 hours  Labs: Na 133 (L) Medications: imodium, mvi with minerals   Diet Order:   Diet Order            DIET DYS 3 Room service appropriate? Yes with Assist; Fluid consistency: Thin  Diet effective now                 EDUCATION NEEDS:   No education needs have been identified at this time  Skin:  Skin Assessment: Skin Integrity Issues: Skin Integrity Issues:: Stage II Stage II: medial cervical, medial  buttocks, L knee, L thigh  Last BM:  4/28 type 6  Height:   Ht Readings from Last 1 Encounters:  11/16/20 5\' 7"  (1.702 m)    Weight:   Wt Readings from Last 1 Encounters:  11/28/20 60.2 kg    BMI:  Body mass index is 20.79 kg/m.  Estimated Nutritional Needs:   Kcal:  1900-2100  Protein:  95-105 grams  Fluid:  >1.9L/d  Larkin Ina, MS, RD, LDN RD pager number and weekend/on-call pager number located in [place].

## 2020-12-02 NOTE — Evaluation (Signed)
Clinical/Bedside Swallow Evaluation Patient Details  Name: [name] MRN: [id number] Date of Birth: Jan 03, 1956  Today's Date: 12/02/2020 Time: SLP Start Time (ACUTE ONLY): 1109 SLP Stop Time (ACUTE ONLY): 1130 SLP Time Calculation (min) (ACUTE ONLY): 21.4 min  Past Medical History:  Past Medical History:  Diagnosis Date  . Acute kidney failure    unspec, secondary to medications  . AIDS ([place]) 06/07/2016  . Alcohol abuse 05/03/2016  . anal ca dx'd 02/2011  . Cachexia ([place])   . 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 Drug abuse ([place])   . GERD (gastroesophageal reflux disease) 08/09/2016  . Grieving 11/29/2017  . Hemorrhoid   . Hepatitis C   . History of radiation therapy 01/01/2011 -05/18/11   anal, pelvis, inguinal lymph nodes  . HIV (human immunodeficiency virus infection) ([place])   . HIV or AIDS   . Housing problems 10/22/2020  . Hypertension   . Loose stools 06/07/2016  . Mass of anus 01/26/2011   squamous cell cancer  . Meningitis   . Mood disorder ([place])   . Pain    right leg  . Pulmonary nodule   . Rash and nonspecific skin eruption 03/31/2015  . Rectal bleeding 08/09/2020  . Testicular hypofunction    Past Surgical History:  Past Surgical History:  Procedure Laterality Date  . BIOPSY  11/27/2020   Procedure: BIOPSY;  Surgeon: Mauri Pole, MD;  Location: [facility] ENDOSCOPY;  Service: Endoscopy;;  . BUBBLE STUDY  11/19/2020   Procedure: BUBBLE STUDY;  Surgeon: [name], MD;  Location: [facility];  Service: Cardiology;;  . Otho Darner SIGMOIDOSCOPY N/A 11/27/2020   Procedure: FLEXIBLE SIGMOIDOSCOPY;  Surgeon: Mauri Pole, MD;  Location: [facility] ENDOSCOPY;  Service: Endoscopy;  Laterality: N/A;  Bedside  . IR ANGIOGRAM PELVIS SELECTIVE OR SUPRASELECTIVE  11/27/2020  . IR ANGIOGRAM PELVIS SELECTIVE OR SUPRASELECTIVE  11/27/2020  . IR ANGIOGRAM VISCERAL SELECTIVE  11/27/2020  . IR US GUIDE VASC ACCESS RIGHT  11/27/2020  . MASS EXCISION  01/26/2011   squamous cell carcinoma  .  TEE WITHOUT CARDIOVERSION N/A 11/19/2020   Procedure: TRANSESOPHAGEAL ECHOCARDIOGRAM (TEE);  Surgeon: [name], MD;  [facility]: [facility];  Service: Cardiology;  Laterality: N/A;   HPI:  [name] is a 65 y.o. 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 He was found to be encephalopathic at attended with acute renal failure and rhabdomyolysis.  He also has new bullous skin lesions on hands and legs. [name] was found to have multiple infarcts and anoxic changes on MRI 4/12.  CXR 4/12 with no acute findings. MBS 4/16: mild oropharyngeal dysphagia c/b prolonged mastication, premature spillage, delayed swallow initiation, and reduced base of tongue retraction.  These deficits resulted in trace, transient penetration of thin liquid by straw prior to the swallow. [name] was last seen by SLP on 4/22 and was tolerating a dysphagia 3 diet and thin liquids at that time. Rapid response on 4/22 due to hypotension and bloody stools. [name] was hard to arouse at that time and only opened his eyes to painful stimuli. GI was consulted 4/22 secondary to rectal pain and bleeding and rectal bleeding was thought to possibly be from radiation radiation proctitis given hx of anal cancer. Flexible sigmoidoscopy on 4/23; Decubitus ulceration, perianal rash,multiple ulcers in the rectum. Biopsies done and Palliative Care consult recommended for [place]. BSE 4/24 recommended dyspahgia 2  and thin liquids. [name] was discharged from SLP services on 4/26 on that diet. SLP was reconsulted on 4/27 for possible liberalization of [name]'s diet.   Assessment / Plan / Recommendation Clinical Impression  [name] was seen for bedside swallow evaluation. [name] is known to this SLP and [name]'s swallow function was improved compared to 4/24. Mastication was prolonged, but functional, with dysphagia 3 solids and oral clearance was  adequate. No s/sx of aspiration were noted with solids or with thin liquids via cup. Coughing was observed with one of three trials of thin liquids via straw and with a dual consistency bolus of dysphagia 3 solids and liquids, but this aligns which his impairments noted during the modified barium swallow study on 4/16. Diet advancement is clinically indicated at this time. A dysphagia 3 diet with thin liquids (no straws) is recommended at this time. Full supervision is still recommended for observance of swallowing precautions. SLP will follow to assess diet tolerance. SLP Visit Diagnosis: Dysphagia, oropharyngeal phase (R13.12)    Aspiration Risk  Mild aspiration risk    Diet Recommendation Thin liquid;Dysphagia 3 (Mech soft)   Liquid Administration via: Cup;No straw Medication Administration: Whole meds with puree Supervision: Staff to assist with self feeding;Full supervision/cueing for compensatory strategies Compensations: Slow rate;Small sips/bites;Minimize environmental distractions Postural Changes: Seated upright at 90 degrees    Other  Recommendations Oral Care Recommendations: Oral care BID;Staff/trained caregiver to provide oral care   Follow up Recommendations Skilled Nursing facility      Frequency and Duration min 2x/week  1 week       Prognosis Prognosis for Safe Diet Advancement: Good Barriers to Reach Goals: Cognitive deficits      Swallow Study   General Date of Onset: 11/16/20 HPI: [name] is a 65 y.o. 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 He was found to be encephalopathic at attended with acute renal failure and rhabdomyolysis.  He also has new bullous skin lesions on hands and legs. [name] was found to have multiple infarcts and anoxic changes on MRI 4/12.  CXR 4/12 with no acute findings. MBS 4/16: mild oropharyngeal  dysphagia c/b prolonged mastication, premature spillage, delayed swallow initiation, and reduced base of tongue retraction.  These deficits resulted in trace, transient penetration of thin liquid by straw prior to the swallow. [name] was last seen by SLP on 4/22 and was tolerating a dysphagia 3 diet and thin liquids at that time. Rapid response on 4/22 due to hypotension and bloody stools. [name] was hard to arouse at that time and only opened his eyes to painful stimuli. GI was consulted 4/22 secondary to rectal pain and bleeding and rectal bleeding was thought to possibly be from radiation radiation proctitis given hx of anal cancer. Flexible sigmoidoscopy on 4/23; Decubitus ulceration, perianal rash,multiple ulcers in the rectum. Biopsies done and Palliative Care consult recommended for [place]. BSE 4/24 recommended dyspahgia 2 and thin liquids. [name] was discharged from SLP services on 4/26 on that diet. SLP was reconsulted on 4/27 for possible liberalization of [name]'s diet. Type of Study: Bedside Swallow Evaluation Previous Swallow Assessment: See HPI Diet Prior to this Study: Dysphagia 2 (chopped);Thin liquids Temperature Spikes Noted: No Respiratory Status: Nasal cannula History of Recent Intubation: No Behavior/Cognition: Alert;Cooperative Oral Cavity Assessment: Within Functional Limits Oral Care Completed by SLP: No Oral Cavity - Dentition: Missing dentition;Poor condition Vision: Functional for  self-feeding Self-Feeding Abilities: Needs assist Patient Positioning: Upright in bed;Postural control adequate for testing Baseline Vocal Quality: Low vocal intensity Volitional Cough: Weak Volitional Swallow: Able to elicit    Oral/Motor/Sensory Function Overall Oral Motor/Sensory Function: Mild impairment Facial ROM: Reduced right;Reduced left Facial Symmetry: Within Functional Limits Lingual Symmetry: Within Functional Limits Lingual Strength: Reduced Velum: Within Functional Limits Mandible: Within  Functional Limits   Ice Chips Ice chips: Within functional limits Presentation: Spoon   Thin Liquid Thin Liquid: Impaired Presentation: Cup;Straw Pharyngeal  Phase Impairments: Cough - Immediate    Nectar Thick Nectar Thick Liquid: Not tested   Honey Thick Honey Thick Liquid: Not tested   Puree Puree: Within functional limits Presentation: Spoon   Solid     Solid: Impaired Oral Phase Impairments: Impaired mastication Other Comments: mildly prolonged but functional for dysphagia 3 solids     Maury Groninger I. Hardin Negus, [place], [place] Office number [phone] Pager [phone]  Horton Marshall 12/02/2020,11:34 AM

## 2020-12-02 NOTE — Progress Notes (Addendum)
   Subjective:   No acute overnight events.  Patient is sleeping comfortably.  Objective:  Vital signs in last 24 hours: Vitals:   12/01/20 1026 12/01/20 1700 12/01/20 2101 12/02/20 0412  BP: 134/67 137/62 (!) 155/69 124/69  Pulse: 94 88 95 79  Resp: 17 18 20 19   Temp: 98.1 F (36.7 C) 98 F (36.7 C) 99.9 F (37.7 C) 98.1 F (36.7 C)  TempSrc:   Oral Axillary  SpO2: 97% 98% 98% 96%  Weight:      Height:       General: elderly, ill-appearing male, sleeping comfortably, NAD. CV: 2+ radial pulses bilaterally. Pulm: unlabored respiratory effort, satting well on room air. Skin: warm and dry  Assessment/Plan:  Principal Problem:   Anoxic brain injury ([place]) Active Problems:   Acute encephalopathy   Pressure injury of skin   Acute renal failure ([facility])   Traumatic rhabdomyolysis (H[facility])   Bullous skin disease   Multiple cerebral infarctions (HCC)   Rectal cancer (HCC)   Acute combined systolic and diastolic heart failure (HCC)   Hyperkalemia   Tongue lesion   Protein-calorie malnutrition, severe   Hemorrhagic shock (HCC)  [name] is a 65 year old man with a history of AIDS and substance use admitted with acute encephalopathy found to have numerous new acute/subacute ischemic infarcts on MRI Brain. Hospital course complicated by hemorrhagic shock secondary to LGIB requiring brief stay in ICU. He is now hemodynamically stable and medically stable for discharge to SNF, however will need family consent as patient does not have decision-making capacity.  Acute encephalopathy Multiple acute/subacute ischemic infarcts - improving on imaging Hypoxic ischemic injury  Spoke with Neurology about MRI findings, anoxic brain injury appears unchanged and felt to be permanent. Prior embolic infarcts seen but no new infarcts noted.  From a neurology standpoint, can restart aspirin 81 mg but will hold off on Plavix at this time.  Greatly appreciate neurology assistance.  Patient still does  not have decision-making capacity.  Family meeting planned for tomorrow morning at 9 AM.  Palliative care consulted, greatly appreciate assistance.  TOC consulted for SNF placement as patient is otherwise medically stable for discharge. -restart ASA 81mg  -continue statin -SLP reevaluated, recommended dysphagia 3 diet  Acute LGIB complicated by hemorrhagic shock, resolved Multiple rectal ulcers 2/2 ischemia Continue supportive care with mesalamine and hydrocortisone suppositories per GI recommendation.  No further episodes of hematochezia noted.  Current stools are [place] stool type VI (mushy).  Patient has remained hemodynamically stable since return from ICU. 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 Will hold off on restarting beta blocker and ARB at this time.  HIV -Continue Biktarvy  Prior to Admission Living Arrangement: Home Anticipated Discharge Location: SNF Barriers to Discharge: Continued medical management   [name], MD 12/02/2020, 6:52 AM Pager: [phone] After 5pm on weekdays and 1pm on weekends: On Call pager [phone]

## 2020-12-02 NOTE — Plan of Care (Signed)

## 2020-12-03 ENCOUNTER — Ambulatory Visit: Payer: Medicare Other | Admitting: Infectious Disease

## 2020-12-03 DIAGNOSIS — Z7189 Other specified counseling: Secondary | ICD-10-CM

## 2020-12-03 LAB — CBC WITH DIFFERENTIAL/PLATELET
Abs Immature Granulocytes: 0.17 10*3/uL — ABNORMAL HIGH (ref 0.00–0.07)
Basophils Absolute: 0.1 10*3/uL (ref 0.0–0.1)
Basophils Relative: 1 %
Eosinophils Absolute: 0.1 10*3/uL (ref 0.0–0.5)
Eosinophils Relative: 1 %
HCT: 24.8 % — ABNORMAL LOW (ref 39.0–52.0)
Hemoglobin: 8 g/dL — ABNORMAL LOW (ref 13.0–17.0)
Immature Granulocytes: 2 %
Lymphocytes Relative: 13 %
Lymphs Abs: 1.3 10*3/uL (ref 0.7–4.0)
MCH: 28.6 pg (ref 26.0–34.0)
MCHC: 32.3 g/dL (ref 30.0–36.0)
MCV: 88.6 fL (ref 80.0–100.0)
Monocytes Absolute: 1.1 10*3/uL — ABNORMAL HIGH (ref 0.1–1.0)
Monocytes Relative: 11 %
Neutro Abs: 7.1 10*3/uL (ref 1.7–7.7)
Neutrophils Relative %: 72 %
Platelets: 562 10*3/uL — ABNORMAL HIGH (ref 150–400)
RBC: 2.8 MIL/uL — ABNORMAL LOW (ref 4.22–5.81)
RDW: 13.9 % (ref 11.5–15.5)
WBC: 9.8 10*3/uL (ref 4.0–10.5)
nRBC: 0 % (ref 0.0–0.2)

## 2020-12-03 LAB — BASIC METABOLIC PANEL
Anion gap: 8 (ref 5–15)
BUN: 8 mg/dL (ref 8–23)
CO2: 25 mmol/L (ref 22–32)
Calcium: 8.4 mg/dL — ABNORMAL LOW (ref 8.9–10.3)
Chloride: 98 mmol/L (ref 98–111)
Creatinine, Ser: 0.8 mg/dL (ref 0.61–1.24)
GFR, Estimated: >60 mL/min (ref >60)
Glucose, Bld: 81 mg/dL (ref 70–99)
Potassium: 3.5 mmol/L (ref 3.5–5.1)
Sodium: 131 mmol/L — ABNORMAL LOW (ref 135–145)

## 2020-12-03 LAB — VITAMIN B1: Vitamin B1 (Thiamine): 126.2 nmol/L (ref 66.5–200.0)

## 2020-12-03 LAB — GLUCOSE, CAPILLARY: Glucose-Capillary: 82 mg/dL (ref 70–99)

## 2020-12-03 MED ORDER — MORPHINE SULFATE (CONCENTRATE) 10 MG/0.5ML PO SOLN
10.0000 mg | ORAL | Status: DC | PRN
  Administered 2020-12-03 – 2020-12-08 (×4): 10 mg via SUBLINGUAL
  Filled 2020-12-03 (×4): qty 0.5

## 2020-12-03 NOTE — Progress Notes (Signed)
Internal Medicine Attending Note:  O: No overnight events   Physical Exam   Blood pressure 117/71, pulse 98, temperature 99.9 F (37.7 C), temperature source Oral, resp. rate 14, height '5\' 7"'  (1.702 m), weight 60.2 kg, SpO2 99 %.  Patient seen this morning prior to family meeting. He was alert, able to answer some questions with one word answers. Being cleaned by nursing staff. Remains bed bound. Increased upper extremity tone.   Assessment and Plan:   [name] is a 65 yo M with a pmhx of uncontrolled HIV, severe protein calorie malnutrition, polysubstance use, chronic untreated hepatitis C, and squamous cell carcinoma of his rectum s/p resection and radiation therapy in 2012. He presented on 4/12 with encephalopathy / unresponsiveness after being found down by his sister for an unknown amount of time. He was hypoxic on EMS arrival to the 70s. Found to be in acute renal failure 2/2 to rhabdomyolysis with acute liver injury. Also found to have diffuse, multifocal, acute cardio embolic infarcts and evidence of superimposed anoxic brain injury. His rhabdomyolysis and acute renal failure improved with aggressive IV hydration. Mental status improved slightly. He was able to answer some yes / no questions, however remained significantly altered from his baseline. Neurology was consulted for his acute CVAs. He was started on DAPT and underwent TEE which did not show any evidence of cardio embolic source. There was concern for infection with his poorly controlled HIV and initially started on broad spectrum antibiotics and IV acyclovir for possible meningitis / encephalitis, however infectious work up including LP was negative and antibiotics were discontinued. He also underwent EEG to rule out subclinical status epilepticus, which showed no evidence of seizure activity. We also empirically treated him for Wernicke's encephalopathy given his history of alcohol abuse with high dose IV thiamine for 5 days.  Despite this, his encephalopathy persisted.   Unfortunately his course was further complicated by hemorrhagic shocked from a lower GI bleed while on DAPT, requiring pressor support and a brief stay in the ICU. He underwent a bedside flexible sigmoidoscopy per GI, found to have multiple large ischemic ulcers with active bleeding and blood clots. Biopsies were taken, negative for evidence of malignancy. Unfortunately GI felt his prognosis was extremely grim. Recommended rectal mesalamine and hydrocortisone suppositories, but no further intervention. Colorectal surgery was also consulted this admission and did not recommend surgical intervention. Due to persistent encephalopathy, MRI brain was repeated on 4/26 for prognostic purposes. I personally reviewed the images with Dr. Leonie Man. MRI demonstrated multifocal subacute infarcts consistent with his admission MRI, no new infarcts, but persistent anoxic changes suggesting irreversibility.   He has been bed bound throughout this hospitalization, unable to ambulate. He is alert but minimally verbal and unable to make medical decisions.  At this point, patient's deficits are felt to be permanent and I do not anticipate his functional status or mentation will improve. His clinical status remains very tenuous and he is high risk for decompensation and further GI bleeding. Additionally, he has severe protein calorie malnutrition and unable to maintain adequate caloric intake by mouth. His prognosis is incredibly poor. Palliative care was consulted and assisted with family goals of care meeting today. Greatly appreciate assistance. We met with two of [name]'s brothers and his sister. One sibling who lives in [place] could not make it. We discussed [name]'s life prior to hospitalization. According to family, he valued his independence over everything.  They were all in agreement that he would not want to live this  way. They acknowledged that this is not the [place] they know. And  while they are hopeful he will improve, they understand he is actively dying and would not wish to prolong his suffering. He has been transitioned to DNR. Remains full scope. Will continue current level of care, however will not escalate if he were to decompensate further. Plan is for discharge to inpatient hospice (hopefully) vs. SNF with hospice. PRN morphine has been added for treatment of his rectal pain. I have also informed his PCP / ID doctor, Dr. Romilda Garret, MD 12/03/2020, 12:36 PM
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
## 2020-12-03 NOTE — Care Management Important Message (Signed)
Important Message  Patient Details  Name: [name] MRN: [id number] Date of Birth: 1955/11/18   Medicare Important Message Given:  Yes - Important Message mailed due to current National Emergency   Verbal consent obtained due to current National Emergency  Relationship to patient: Self Contact Name: Tyran Call Date: 12/03/20  Time: 22 Phone: [phone] Outcome: No Answer/Busy Important Message mailed to: Patient address on file    [name] 12/03/2020, 10:15 AM

## 2020-12-03 NOTE — Discharge Summary (Signed)
Name: [name] MRN: [id number] DOB: 08-Feb-1956 65 y.o. PCP: [name], [name], MD  Date of Admission: 11/16/2020 11:01 AM Date of Discharge:  12/06/20 Attending Physician: Lucious Groves, DO  Discharge Diagnosis: 1. Acute Encephalopathy 2/2 Diffuse ischemic infarcts and hypoxic brain injury  2. AKI  3. Rhabdomyolysis  4. Hypertensive Emergency 5. Acute Liver Injury  6. Chronic hepatitis C  7. NSTEMI  8. Hemorrhagic Shock 2/2 Rectal Ulcers  Discharge Medications: Allergies as of 12/06/2020   No Known Allergies     Medication List    STOP taking these medications   clotrimazole 1 % cream Commonly known as: LOTRIMIN   HYDROcodone-acetaminophen 5-325 MG tablet Commonly known as: NORCO/VICODIN   mupirocin ointment 2 % Commonly known as: BACTROBAN   omeprazole 40 MG capsule Commonly known as: PRILOSEC   oxyCODONE-acetaminophen 5-325 MG tablet Commonly known as: PERCOCET/ROXICET   Symtuza 800-150-200-10 MG Tabs Generic drug: Darunavir-Cobicisctat-Emtricitabine-Tenofovir Alafenamide     TAKE these medications   acetaminophen 325 MG tablet Commonly known as: TYLENOL Take 2 tablets (650 mg total) by mouth every 6 (six) hours as needed for mild pain (or Fever >/= 101).   aspirin 81 MG EC tablet Take 1 tablet (81 mg total) by mouth daily. Swallow whole. Start taking on: Dec 07, 2020   bictegravir-emtricitabine-tenofovir AF 50-200-25 MG Tabs tablet Commonly known as: BIKTARVY Take 1 tablet by mouth daily.   collagenase ointment Commonly known as: SANTYL Apply topically daily. Start taking on: Dec 07, 2020   feeding supplement Liqd Take 237 mLs by mouth 3 (three) times daily between meals.   hydrocortisone 25 MG suppository Commonly known as: ANUSOL-HC Place 1 suppository (25 mg total) rectally daily. Start taking on: Dec 07, 2020   loperamide 2 MG capsule Commonly known as: IMODIUM Take 1 capsule (2 mg total) by mouth as needed for diarrhea or  loose stools.   mesalamine 1000 MG suppository Commonly known as: CANASA Place 1 suppository (1,000 mg total) rectally at bedtime.   morphine CONCENTRATE 10 MG/0.5ML Soln concentrated solution Place 0.5 mLs (10 mg total) under the tongue every 2 (two) hours as needed for moderate pain, severe pain, anxiety or shortness of breath.            Discharge Care Instructions  (From admission, onward)         Start     Ordered   12/06/20 0000  Discharge wound care:       Comments: Redress wound withTransparent dressing. For areas with intact or ruptured blisters, place Xeroform gauze over these sites. If possible, secure with a few turns of kerlex.  If securing with kerlex is not possible, cover with a foam dressing.  Change daily.   12/06/20 1719          Disposition and follow-up:   Mr.[name] was discharged from [facility] in Stable condition.  At the hospital follow up visit please address:  1. Acute encephalopathy Multiple acute/subacute ischemic infarcts Hypoxic ischemic injury: Patient remains minimally responsive to voice and only intermittently interactive. Expect he will continue to decline. Continue on Asa 81 mg and dysphagia diet.  Discharge to hospice at [facility].  Hemorrhagic Shock 2/2 Rectal Ulcers: Continue on mesalamine and hydrocortisone suppositories and morphine as needed for pain  HIV: Continue Biktarvy  Hypertension Holding BB and ARB  2.  Labs / imaging needed at time of follow-up: None  3.  Pending labs/ test needing follow-up: None  Follow-up Appointments:  Hospital Course by problem list: 1.Acute Encephalopathy  Patient presents with altered mental status and was unresponsive during our encounter. Initially multiple etiologies suspected including metabolic encephalopathy secondary to uremia in setting of AKI, hepatic encephalopathy, infectious etiology given  immunocompromised state, and Wernicke encephalopathy, CT  Head w/o contrast demonstrated area of possible new infarction in the parietal lobe .Follow-up MRI brain later showed multiple abnormalities including a 2.6 area of ischemic infarct in the right occipital lobe corresponding with prior CT findings, multiple subacute ischemic infarcts involving multiple vascular territories likely from embolic CVA, additional findings concerning for hypoxic ischemic injury. No evidence of seizure on EEG. Treated with high dose thiamine given alcohol history. Mental decline likely in the setting of multiple ischemic brain infarcts and hypoxic brain injury. Initially with some improvement in mental status but remained below baseline. However this worsened after developing acute GI bleeding and subsequent hemorrhagic shock. Mental status continued to decline following this as patient became less interactive, nonverbal, and minimal responsiveness to noxious stimuli.  MRI repeated showing multi focal subacute embolic infarcts consistent with findings on the admission MRI, no new acute infarcts, and changes in the bilateral basal ganglia suggestive of anoxic injury, which are likely permanent given persistent findings on repeat MRI. Goals of care discussions started with palliative care given lack of improvment. Per family discussion with palliative on 4/29 patient transitioned to DNR but remains full scope. Family ultimately would like for patient to go to hospice at [facility].  2. Lower GI bleeding On 4/22 he developed brisk lower GI bleed resulting in hemorrhagic shock requiring transfer to the ICU. He received 3 units pRBCs and 1 unit of platelets. IR was consulted for angiography and possible embolectomy, but there was no active extravasation identified. GI performed bedside flex sig which showed several ulcers, but nothing to actively intervene on. By 4/24, his blood counts an hemodynamics stabilized and was transferred out of the ICU. DAPT was held due to LGIB intitially and  subsequently restarted on ASA give multiple embolic infarcts on brain imaging. Biopsy pathology report from rectal ulcers with nonspecific findings although with focal changes suggestive of ischemia. Started on supportive care withmesalamine and hydrocortisone suppositories and avoidance of rectal tube to prevent rebleeding. No further bleeding during admission and patient Hgb remained stable following this.  3. AKI Hyperkalemia Rhabdomyolysis  Patient's presented  With Cr 5.21, GFR 11. Baseline Cr ~1.00, GFR>60. Patient's AKI is likely multifactorial secondary to pre-renal and intrinsic etiologies. Concern for hypotension leading to ATN. U/A demonstrated granular casts which is consistent with ATN as well as elevated CK, AST, ALT, and hyperkalemia with urine studies c/w rhabdomyolysis. EKG findings noted are consistent with acute hyperkalemia. Treated with IV fluids with improvement in potassium, kidney function, and CK.   4. AIDS Presented with low CD4 count of 197, down from 238 on 08/09/20. Patient has a history of not taking home Symtuza according to ID team. Home symtuza due to concerns for possible SJS/TEN given bullae on physical exam. Started on Biktarvy during admission.  5. 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 Likely related to cocaine use. This improved without intervention during admission and BP remained normotensive off medications. No signs of fluid overload.  6. Bullous Lesions Patient presented with tense, bullous lesions on extensor surfaces of hands, knees bilaterally. 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 Bullous pemphigoid is also  possible given tense nature of bullae. On home Symtuza, which can cause stevens-johnson syndrome and this can present with fluid-filled bullae. SJS is unlikely as patient has taken this medication for extended period of  time without complications and no skin sloughing noted. However he was switched to [facility] during admission. Workup delayed due to difficultly reaching family, given mental decline and ultimate decision to pursue hospice care patient was continued on supportive care with wound care and pain management.   Discharge Exam:   Blood pressure 104/62, pulse (!) 102, temperature 99.9 F (37.7 C), temperature source Oral, resp. rate 18, height 5\' 7"  (1.702 m), weight 60.2 kg, SpO2 100 %. Constitutional:      Appearance: He is ill-appearing. He is not toxic-appearing.     Comments: Chronically ill appearing  Cardiovascular:     Rate and Rhythm: Normal rate and regular rhythm.  Pulmonary:     Effort: Pulmonary effort is normal. No respiratory distress.     Breath sounds: Normal breath sounds. No stridor. No wheezing, rhonchi or rales.  Abdominal:     General: Abdomen is flat. Bowel sounds are normal.     Tenderness: There is no abdominal tenderness. There is no guarding.  Neurological:     Mental Status: He is alert.   Pertinent Labs, Studies, and Procedures:  CT Head Wo Contrast  Result Date: 11/16/2020 CLINICAL DATA:  Altered mental status. History of anorectal carcinoma. EXAM: CT HEAD WITHOUT CONTRAST TECHNIQUE: Contiguous axial images were obtained from the base of the skull through the vertex without intravenous contrast. COMPARISON:  Head CT 10/19/2008. FINDINGS: Brain: There is a rounded focus of hypoattenuation in the posterior right parietal lobe measuring 2.1 by 1.7 by 1.3 cm. Scattered hypoattenuation in the subcortical and periventricular deep white matter is noted. Remote lacunar infarctions in the basal ganglia are seen. No hemorrhage, hydrocephalus or midline shift. Vascular: Atherosclerosis noted. Skull: Intact.  No focal lesion. Sinuses/Orbits: Negative. Other: None. IMPRESSION: Rounded focus of hypoattenuation in the posterior right parietal lobe may be due to an infarct but cannot be  definitively characterized. Brain MRI with and without contrast is recommended for further evaluation. Chronic microvascular ischemic change. Electronically Signed   By: Inge Rise M.D.   On: 11/16/2020 14:43   MR BRAIN WO CONTRAST  Result Date: 11/16/2020 CLINICAL DATA:  Initial evaluation for acute mental status change, unknown cause. EXAM: MRI HEAD WITHOUT CONTRAST TECHNIQUE: Multiplanar, multiecho pulse sequences of the brain and surrounding structures were obtained without intravenous contrast. COMPARISON:  Prior head CT from earlier the same day. FINDINGS: Brain: Diffuse prominence of the CSF containing spaces compatible with generalized cerebral atrophy. Patchy T2/FLAIR hyperintensity within the periventricular and deep white matter both cerebral hemispheres as well as the pons, consistent with chronic microvascular ischemic disease, moderate in nature. 2.6 area of restricted diffusion involving the cortical and subcortical aspect of the right occipital pole, consistent with acute to early subacute ischemic infarct (series 5, image 76). Finding corresponds with abnormality on prior CT. Associated mild petechial hemorrhage without frank hemorrhagic transformation or significant mass effect. Multiple additional scattered foci of restricted diffusion involving the cortical and subcortical aspect of the bilateral cerebral hemispheres are seen, also consistent with acute ischemic changes (series 5, images 91, 86, 79). Patchy involvement of the caudate and lentiform nuclei noted bilaterally. Few additional scattered punctate ischemic infarcts noted involving the bilateral cerebellar hemispheres. No associated hemorrhage or significant mass effect. Additionally, there is abnormal symmetric restricted diffusion and FLAIR signal abnormality  involving the globus pallidi bilaterally (series 5, image 75). Appearance is most typical of a superimposed hypoxic ischemic injury versus toxic metabolic derangement.  Additionally, there is abnormal restricted diffusion involving the mesial left temporal lobe with involvement of the left hippocampus (series 5, image 70). While this finding may be ischemic in nature, superimposed changes of seizure could also be considered. No mass lesion, significant mass effect, or midline shift. No hydrocephalus or extra-axial fluid collection. Pituitary gland and suprasellar region normal. Midline structures intact. Vascular: Major intracranial vascular flow voids are maintained. Skull and upper cervical spine: Craniocervical junction within normal limits. Signal intensity within the visualized bone marrow of the upper cervical spine is diffusely heterogeneous and decreased on T1 weighted imaging, nonspecific, but most commonly related to anemia, smoking, or obesity. No focal marrow replacing lesion. No scalp soft tissue abnormality. Sinuses/Orbits: Globes and orbital soft tissues demonstrate no acute finding. Scattered mucosal thickening noted throughout the paranasal sinuses. No significant mastoid effusion. Inner ear structures grossly normal. Other: None. IMPRESSION: 1. 2.6 area of acute to early subacute ischemic infarct involving the right occipital pole, corresponding with abnormality on prior CT. Associated mild petechial hemorrhage without frank hemorrhagic transformation or significant mass effect. 2. Multiple additional scattered acute to early subacute ischemic infarcts involving the cortical and subcortical aspect of both cerebral and cerebellar hemispheres. Overall, a central thromboembolic etiology is suspected given the various vascular distributions involved. 3. Additional abnormal symmetric restricted diffusion involving the globus pallidi bilaterally. Appearance is most characteristic of a superimposed/concomitant hypoxic ischemic injury versus toxic metabolic derangement. 4. Restricted diffusion involving the mesial left temporal lobe with involvement of the left  hippocampus. While this finding may be ischemic in nature as well, superimposed changes of seizure and/or limbic encephalitis could also be considered. Correlation with EEG recommended. 5. Underlying age-related cerebral atrophy with moderate chronic microvascular ischemic disease. Electronically Signed   By: Jeannine Boga M.D.   On: 11/16/2020 21:26   DG Chest Portable 1 View  Result Date: 11/16/2020 CLINICAL DATA:  Altered mental status EXAM: PORTABLE CHEST 1 VIEW COMPARISON:  08/24/2020 FINDINGS: Cardiac shadow is within normal limits. Tortuous thoracic aorta is noted accentuated by patient rotation. The lungs are clear bilaterally. Old rib fractures on the left are seen and stable. No new focal abnormality is noted. IMPRESSION: No acute abnormality seen. Electronically Signed   By: Inez Catalina M.D.   On: 11/16/2020 12:00   EEG adult  Result Date: 11/17/2020 Derek Jack, MD     11/17/2020  8:00 AM Routine EEG Report Osiah Dell Santellan is a 65 y.o. male with a history of acute encephalopathy who is undergoing an EEG to evaluate for seizures. Report: This EEG was acquired with electrodes placed according to the International 10-20 electrode system (including Fp1, Fp2, F3, F4, C3, C4, P3, P4, O1, O2, T3, T4, T5, T6, A1, A2, Fz, Cz, Pz). The following electrodes were missing or displaced: none. The best background activity was 5-6 Hz with intermittent delta slowing. This activity is reactive to stimulation. Sleep was characterized by vertex waves, sleep spindles (12 to 14 Hz). There was no focal slowing. There were no interictal epileptiform discharges. There were no electrographic seizures identified. Photic stimulation and hyperventilation were not performed. Impression and clinical correlation: This EEG was obtained while obtunded and is abnormal due to moderate diffuse slowing suggestive of general cerebral dysfunction. Su Monks, MD [facility] [phone] If 7pm- 7am, please  page neurology on call as listed in [place].  ECHOCARDIOGRAM LIMITED  Result Date: 11/17/2020    ECHOCARDIOGRAM LIMITED REPORT   Patient Name:   ANDRANIK JEUNE Gass Date of Exam: 11/17/2020 Medical Rec #:  [id number]          Height:       67.0 in Accession #:    [id number]         Weight:       134.0 lb Date of Birth:  08-Jun-1956          BSA:          1.706 m Patient Age:    41 years           BP:           163/89 mmHg Patient Gender: M                  HR:           74 bpm. Exam Location:  Inpatient Procedure: Limited Echo, Limited Color Doppler and Cardiac Doppler Indications:    Elevated Troponin  History:        Patient has no prior history of Echocardiogram examinations.                 Risk Factors:Hypertension and ETOH.  Sonographer:    Bernadene Person RDCS Referring Phys: [id number] [place]  1. Left ventricular ejection fraction, by estimation, is 45 to 50%. The left ventricle has mildly decreased function. The left ventricle demonstrates regional wall motion abnormalities (see scoring diagram/findings for description). There is hypokinesis  of the anterior and anteroseptal LV wall segments. There is moderate concentric left ventricular hypertrophy. Left ventricular diastolic parameters are consistent with Grade I diastolic dysfunction (impaired relaxation).  2. Right ventricular systolic function is normal. The right ventricular size is normal. There is normal pulmonary artery systolic pressure.  3. Moderate pleural effusion in the left lateral region.  4. The mitral valve is normal in structure. Trivial mitral valve regurgitation.  5. The aortic valve is tricuspid. There is mild calcification of the aortic valve. There is mild thickening of the aortic valve. Aortic valve regurgitation is mild. Mild aortic valve sclerosis is present, with no evidence of aortic valve stenosis.  6. The inferior vena cava is normal in size with greater than 50% respiratory variability, suggesting right  atrial pressure of 3 mmHg.  7. There is mildly reduced LVEF with anterior and anteroseptal wall motion abnormalities concerning for possible LAD lesion. . Comparison(s): No prior Echocardiogram. FINDINGS  Left Ventricle: Left ventricular ejection fraction, by estimation, is 45 to 50%. The left ventricle has mildly decreased function. The left ventricle demonstrates regional wall motion abnormalities. There is hypokinesis of the anterior and anteroseptal LV wall segments. The left ventricular internal cavity size was normal in size. There is moderate concentric left ventricular hypertrophy. Left ventricular diastolic parameters are consistent with Grade I diastolic dysfunction (impaired relaxation). Normal left ventricular filling pressure. Right Ventricle: The right ventricular size is normal. No increase in right ventricular wall thickness. Right ventricular systolic function is normal. There is normal pulmonary artery systolic pressure. The tricuspid regurgitant velocity is 2.17 m/s, and  with an assumed right atrial pressure of 3 mmHg, the estimated right ventricular systolic pressure is 96.2 mmHg. Pericardium: There is no evidence of pericardial effusion. Mitral Valve: The mitral valve is normal in structure. There is mild thickening of the mitral valve leaflet(s). Mild mitral annular calcification. Trivial mitral valve regurgitation. Tricuspid Valve: The tricuspid valve is normal in structure.  Tricuspid valve regurgitation is trivial. Aortic Valve: The aortic valve is tricuspid. There is mild calcification of the aortic valve. There is mild thickening of the aortic valve. Aortic valve regurgitation is mild. Aortic regurgitation PHT measures 569 msec. Mild aortic valve sclerosis is present, with no evidence of aortic valve stenosis. Pulmonic Valve: The pulmonic valve was not well visualized. Pulmonic valve regurgitation is not visualized. Venous: The inferior vena cava is normal in size with greater than 50%  respiratory variability, suggesting right atrial pressure of 3 mmHg. Additional Comments: There is a moderate pleural effusion in the left lateral region. LEFT VENTRICLE PLAX 2D LVIDd:         4.40 cm     Diastology LVIDs:         3.40 cm     LV e' medial:    7.76 cm/s LV PW:         1.30 cm     LV E/e' medial:  6.4 LV IVS:        1.10 cm     LV e' lateral:   10.00 cm/s LVOT diam:     2.20 cm     LV E/e' lateral: 4.9 LV SV:         63 LV SV Index:   37 LVOT Area:     3.80 cm  LV Volumes (MOD) LV vol d, MOD A2C: 64.6 ml LV vol d, MOD A4C: 71.3 ml LV vol s, MOD A2C: 31.4 ml LV vol s, MOD A4C: 36.3 ml LV SV MOD A2C:     33.2 ml LV SV MOD A4C:     71.3 ml LV SV MOD BP:      33.7 ml RIGHT VENTRICLE RV S prime:     11.70 cm/s TAPSE (M-mode): 2.1 cm LEFT ATRIUM         Index LA diam:    2.30 cm 1.35 cm/m  AORTIC VALVE LVOT Vmax:   90.10 cm/s LVOT Vmean:  54.100 cm/s LVOT VTI:    0.167 m AI PHT:      569 msec  AORTA Ao Root diam: 3.20 cm MITRAL VALVE               TRICUSPID VALVE MV Area (PHT): 3.27 cm    TR Peak grad:   18.8 mmHg MV Decel Time: 232 msec    TR Vmax:        217.00 cm/s MV E velocity: 49.30 cm/s MV A velocity: 53.60 cm/s  SHUNTS MV E/A ratio:  0.92        Systemic VTI:  0.17 m                            Systemic Diam: 2.20 cm Gwyndolyn Kaufman MD Electronically signed by Gwyndolyn Kaufman MD Signature Date/Time: 11/17/2020/10:24:55 AM    Final     Discharge Instructions: Discharge Instructions    Ambulatory referral to Neurology   Complete by: As directed    Follow up with Dr. Leonie Man at [facility] in 4-6 weeks. Too complicated for NP to follow. Thanks.   Diet - low sodium heart healthy   Complete by: As directed    Discharge instructions   Complete by: As directed    You were hospitalized for altered mental status due to multiple ischemic brain infarcts and anoxic brain injury worsened and lower GI bleeding. Mental status has not improved and patient becoming less interactive and responsive. Will be  discharged to hospice to [facility]. Thank you for allowing [place] to be part of your care.  Coninue on comfort mesures including feeding supplment, morphine, and loperimide. Continue aspiring, biktarvy, mesalamine, and hydrocortisone suppositories.   Discharge wound care:   Complete by: As directed    Redress wound withTransparent dressing. For areas with intact or ruptured blisters, place Xeroform gauze over these sites. If possible, secure with a few turns of kerlex.  If securing with kerlex is not possible, cover with a foam dressing.  Change daily.   Increase activity slowly   Complete by: As directed       Signed: [name], MD Internal Medicine PGY-1 12/06/20 5:19 PM

## 2020-12-03 NOTE — Progress Notes (Signed)
Physical Therapy Treatment Patient Details Name: [name] MRN: [id number] DOB: [birth year] Today's Date: 12/03/2020    History of Present Illness Shuaib Corsino is a 65 y.o. male who reported to ED with AMS. Admitted on 11/16/20 with anoxic brain injury and acute encephalopathy. 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    PT Comments    Pt received in bed. Total Ax2 for mobility. Upon rolling, pt found to have BM. Time spent providing pericare. Due to active BM, further mobility deferred. Pt appear somewhat more aware today. Inconsistently following simple commands and noted to be spontaneously moving UEs. Left in bed with all needs met, call bell within reach, and nursing aware of status. Plan to establish [place] with family. Will continue to follow acutely.    Follow Up Recommendations  SNF     Equipment Recommendations  Wheelchair (measurements PT);Wheelchair cushion (measurements PT);Hospital bed (mechanical lift)    Recommendations for Other Services       Precautions / Restrictions Precautions Precautions: Fall Precaution Comments: found down, skin integrity Restrictions Weight Bearing Restrictions: No    Mobility  Bed Mobility Overal bed mobility: Needs Assistance Bed Mobility: Rolling Rolling: Total assist;+2 for physical assistance         General bed mobility comments: rolling for pericare    Transfers                 General transfer comment: unable  Ambulation/Gait             General Gait Details: unable   Stairs             Wheelchair Mobility    Modified Rankin (Stroke Patients Only)       Balance Overall balance assessment: Needs assistance Sitting-balance support: Feet supported;Bilateral upper extremity supported Sitting balance-Leahy Scale: Zero Sitting balance - Comments: max A +2 for sitting balance Postural control: Left lateral  lean                                  Cognition Arousal/Alertness: Awake/alert Behavior During Therapy: Flat affect Overall Cognitive Status: Difficult to assess Area of Impairment: Following commands;Problem solving;Attention;Awareness                   Current Attention Level: Focused   Following Commands: Follows one step commands inconsistently;Follows one step commands with increased time   Awareness: Emergent Problem Solving: Slow processing;Requires verbal cues;Requires tactile cues;Difficulty sequencing;Decreased initiation General Comments: no verbal responses but actively moving hands and head. Would look towards sound. Was able to initiate movement at legs when asked to lift them off the bed      Exercises      General Comments        Pertinent Vitals/Pain Pain Assessment: Faces Faces Pain Scale: No hurt Pain Intervention(s): Monitored during session    Home Living                      Prior Function            PT Goals (current goals can now be found in the care plan section) Acute Rehab PT Goals Patient Stated Goal: none stated PT Goal Formulation: Patient unable to participate in goal setting    Frequency    Min 3X/week      PT Plan Current plan remains appropriate  Co-evaluation              AM-PAC PT "6 Clicks" Mobility   Outcome Measure  Help needed turning from your back to your side while in a flat bed without using bedrails?: Total Help needed moving from lying on your back to sitting on the side of a flat bed without using bedrails?: Total Help needed moving to and from a bed to a chair (including a wheelchair)?: Total Help needed standing up from a chair using your arms (e.g., wheelchair or bedside chair)?: Total Help needed to walk in hospital room?: Total Help needed climbing 3-5 steps with a railing? : Total 6 Click Score: 6    End of Session   Activity Tolerance: Other (comment) (Limited  by active BM) Patient left: in bed;with call bell/phone within reach;with bed alarm set;Other (comment) (Prevalon boots applied) Nurse Communication: Mobility status;Need for lift equipment PT Visit Diagnosis: Unsteadiness on feet (R26.81);Other abnormalities of gait and mobility (R26.89);Repeated falls (R29.6);Muscle weakness (generalized) (M62.81);History of falling (Z91.81)     Time:  -     Charges:                        Rosita Kea, SPT

## 2020-12-04 LAB — GLUCOSE, CAPILLARY: Glucose-Capillary: 94 mg/dL (ref 70–99)

## 2020-12-04 NOTE — Progress Notes (Signed)
   12/04/20 2104  Assess: MEWS Score  Temp (!) 101.4 F (38.6 C) (Tylenol was given)  BP 127/70  Pulse Rate (!) 111  Resp 18  Level of Consciousness Alert  SpO2 98 %  O2 Device Room Air  Patient Activity (if Appropriate) In bed  Assess: MEWS Score  MEWS Temp 1  MEWS Systolic 0  MEWS Pulse 2  MEWS RR 0  MEWS LOC 0  MEWS Score 3  MEWS Score Color Yellow  Assess: if the MEWS score is Yellow or Red  Were vital signs taken at a resting state? Yes  Focused Assessment Change from prior assessment (see assessment flowsheet)  Early Detection of Sepsis Score *See Row Information* Medium  MEWS guidelines implemented *See Row Information* Yes  Treat  MEWS Interventions Administered prn meds/treatments  Take Vital Signs  Increase Vital Sign Frequency  Yellow: Q 2hr X 2 then Q 4hr X 2, if remains yellow, continue Q 4hrs  Escalate  MEWS: Escalate Yellow: discuss with charge nurse/RN and consider discussing with provider and RRT  Notify: Charge Nurse/RN  Name of Charge Nurse/RN Notified Joelene Millin RN  Date Charge Nurse/RN Notified 12/04/20  Time Charge Nurse/RN Notified 2104  Document  Patient Outcome Not stable and remains on department  Progress note created (see row info) Yes

## 2020-12-04 NOTE — Plan of Care (Signed)
  Problem: Clinical Measurements: Goal: Respiratory complications will improve Outcome: Adequate for Discharge   

## 2020-12-04 NOTE — Progress Notes (Signed)
Subjective:   O/N Events: None  Mr. [name] was seen at bedside this morning. He opened his eyes to voice, but did not participate during the interview.   Objective:  Vital signs in last 24 hours: Vitals:   12/03/20 2110 12/03/20 2111 12/04/20 0429 12/04/20 0939  BP: 136/73 136/73 (!) 84/49 105/62  Pulse: 88 90 88 80  Resp: 16  20 18   Temp: (!) 100.5 F (38.1 C)  98 F (36.7 C) 98.2 F (36.8 C)  TempSrc: Oral   Oral  SpO2: 100% 100% 99% 93%  Weight:      Height:       Physical Exam Constitutional:      General: He is not in acute distress.    Appearance: He is ill-appearing. He is not toxic-appearing or diaphoretic.  HENT:     Head: Normocephalic.  Cardiovascular:     Rate and Rhythm: Normal rate and regular rhythm.     Pulses: Normal pulses.     Heart sounds: Normal heart sounds. No murmur heard. No friction rub. No gallop.   Pulmonary:     Effort: Pulmonary effort is normal.  Abdominal:     General: Abdomen is flat. Bowel sounds are normal.     Palpations: Abdomen is soft.     Tenderness: There is no abdominal tenderness. There is no guarding.    CBC Latest Ref Rng & Units 12/03/2020 12/02/2020 12/01/2020  WBC 4.0 - 10.5 K/uL 9.8 11.4(H) 11.8(H)  Hemoglobin 13.0 - 17.0 g/dL 8.0(L) 8.0(L) 7.9(L)  Hematocrit 39.0 - 52.0 % 24.8(L) 24.8(L) 24.3(L)  Platelets 150 - 400 K/uL 562(H) 526(H) 506(H)    BMP Latest Ref Rng & Units 12/03/2020 12/02/2020 12/01/2020  Glucose 70 - 99 mg/dL 81 91 82  BUN 8 - 23 mg/dL 8 9 10   Creatinine 0.61 - 1.24 mg/dL 0.80 0.80 0.84  BUN/Creat Ratio 6 - 22 (calc) - - -  Sodium 135 - 145 mmol/L 131(L) 133(L) 136  Potassium 3.5 - 5.1 mmol/L 3.5 3.6 3.8  Chloride 98 - 111 mmol/L 98 100 104  CO2 22 - 32 mmol/L 25 22 23   Calcium 8.9 - 10.3 mg/dL 8.4(L) 8.3(L) 8.2(L)    Assessment/Plan:  Principal Problem:   Anoxic brain injury (Early) Active Problems:   Goals of care, counseling/discussion   Acute encephalopathy   Pressure injury of skin    Acute renal failure (HCC)   Traumatic rhabdomyolysis (HCC)   Bullous skin disease   Multiple cerebral infarctions (HCC)   Rectal cancer (HCC)   Acute combined systolic and diastolic heart failure (HCC)   Hyperkalemia   Tongue lesion   Protein-calorie malnutrition, severe   Hemorrhagic shock ([place])   Rectal ulceration  [name] is a 65 year old man with a history of AIDS and substance use admitted with acute encephalopathy found to have numerous new acute/subacute ischemic infarcts on MRI Brain. Hospital course complicated by hemorrhagic shock secondary to LGIB requiring brief stay in ICU. He is now hemodynamically stable and medically stable for discharge to SNF.  Acute encephalopathy Multiple acute/subacute ischemic infarcts - improving on imaging Hypoxic ischemic injury  Spoke with Neurology about MRI findings, anoxic brain injury appears unchanged and felt to be permanent. Per family discussion on 4/29 patient transitioned to DNR but remains full scope. Will not escalate if he were to decompensate.  - DC to Inpatient hospice vs SNF with hospice pending be availability - ASA 81mg  - Dysphagia 3 diet  Acute LGIB complicated by hemorrhagic shock,  resolved Multiple rectal ulcers 2/2 ischemia Continue supportive care with mesalamine and hydrocortisone suppositories per GI recommendation.  No further escalation of care given new status changes.  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

## 2020-12-04 NOTE — Progress Notes (Signed)
Pt is now a red mews d/t temp of 101.8 and pulse of 118. RN already gave Tylenol when pt temp was 101.4 and now it has went up more. RN messaged MD on call to make aware and for further instructions.   Eleanora Neighbor, RN

## 2020-12-05 NOTE — Progress Notes (Signed)
Subjective:   O/N Events: Fever 101.8 F  [name] was seen at bedside this morning. He states that he is doing well this AM. He did have a fever last night, but denies any other symptoms.   Objective:  Vital signs in last 24 hours: Vitals:   12/04/20 2257 12/05/20 0011 12/05/20 0346 12/05/20 0444  BP: 128/77 117/73 135/69 119/68  Pulse: (!) 117 (!) 108 95 90  Resp: 20 16 16 17   Temp: (!) 101.8 F (38.8 C) 99.2 F (37.3 C) 99.1 F (37.3 C) 98.2 F (36.8 C)  TempSrc: Oral Oral Oral Oral  SpO2: 97% 98% 100% 100%  Weight:      Height:       Physical Exam Constitutional:      Appearance: He is ill-appearing. He is not toxic-appearing or diaphoretic.     Comments: Chronically ill appearing  Cardiovascular:     Rate and Rhythm: Normal rate and regular rhythm.  Pulmonary:     Effort: Pulmonary effort is normal. No respiratory distress.     Breath sounds: Normal breath sounds. No stridor. No wheezing, rhonchi or rales.  Chest:     Chest wall: No tenderness.  Abdominal:     General: Abdomen is flat. Bowel sounds are normal.     Tenderness: There is no abdominal tenderness. There is no guarding.  Neurological:     Mental Status: He is alert.    CBC Latest Ref Rng & Units 12/03/2020 12/02/2020 12/01/2020  WBC 4.0 - 10.5 K/uL 9.8 11.4(H) 11.8(H)  Hemoglobin 13.0 - 17.0 g/dL 8.0(L) 8.0(L) 7.9(L)  Hematocrit 39.0 - 52.0 % 24.8(L) 24.8(L) 24.3(L)  Platelets 150 - 400 K/uL 562(H) 526(H) 506(H)    BMP Latest Ref Rng & Units 12/03/2020 12/02/2020 12/01/2020  Glucose 70 - 99 mg/dL 81 91 82  BUN 8 - 23 mg/dL 8 9 10   Creatinine 0.61 - 1.24 mg/dL 0.80 0.80 0.84  BUN/Creat Ratio 6 - 22 (calc) - - -  Sodium 135 - 145 mmol/L 131(L) 133(L) 136  Potassium 3.5 - 5.1 mmol/L 3.5 3.6 3.8  Chloride 98 - 111 mmol/L 98 100 104  CO2 22 - 32 mmol/L 25 22 23   Calcium 8.9 - 10.3 mg/dL 8.4(L) 8.3(L) 8.2(L)    Assessment/Plan:  Principal Problem:   Anoxic brain injury ([place]) Active Problems:    Goals of care, counseling/discussion   Acute encephalopathy   Pressure injury of skin   Acute renal failure (HCC)   Traumatic rhabdomyolysis (HCC)   Bullous skin disease   Multiple cerebral infarctions (HCC)   Rectal cancer (HCC)   Acute combined systolic and diastolic heart failure (HCC)   Hyperkalemia   Tongue lesion   Protein-calorie malnutrition, severe   Hemorrhagic shock ([place])   Rectal ulceration  [name] is a 65 year old man with a history of AIDS and substance use admitted with acute encephalopathy found to have numerous new acute/subacute ischemic infarcts on MRI Brain. Hospital course complicated by hemorrhagic shock secondary to LGIB requiring brief stay in ICU. He is now hemodynamically stable and medically stable for discharge to SNF.  Acute encephalopathy Multiple acute/subacute ischemic infarcts - improving on imaging Hypoxic ischemic injury  Per family discussion on 4/29 patient transitioned to DNR but remains full scope. Will not escalate if he were to decompensate.  - DC to Inpatient hospice vs SNF with hospice pending be availability - ASA 81mg  - Dysphagia 3 diet  Acute LGIB complicated by hemorrhagic shock, resolved Multiple rectal ulcers 2/2  ischemia Continue supportive care with mesalamine and hydrocortisone suppositories per GI recommendation.  No further escalation of care given new status changes.  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

## 2020-12-05 NOTE — TOC Progression Note (Signed)
Transition of Care [facility]) - Progression Note    Patient Details  Name: [name] MRN: [id number] Date of Birth: 08-19-1955  Transition of [facility] CM/SW [place], [place] Phone Number: 12/05/2020, 11:27 AM  Clinical Narrative:     CSW confirmed with MD that pt currently has a prognosis of less than two weeks. CSW reached out to sister who noted that her and her brother had discussed Optometrist with Palliative, and were interested. CSW made referral to [place] with [facility]. SW will continue to follow for any DC needs.  Expected Discharge Plan: Skilled Nursing Facility Barriers to Discharge: Homeless with medical needs,Active Substance Use - Placement,SNF Pending bed offer  Expected Discharge Plan and Services Expected Discharge Plan: [place] In-house Referral: Clinical Social Work   Post Acute Care Choice: [place] Living arrangements for the past 2 months: No permanent address,Homeless                                       Social Determinants of Health (SDOH) Interventions    Readmission Risk Interventions No flowsheet data found.

## 2020-12-05 NOTE — Progress Notes (Signed)
Pt's stool is dark red and has large clots. RN messaged MD on call to make aware.   Eleanora Neighbor, RN

## 2020-12-05 NOTE — Progress Notes (Signed)

## 2020-12-05 NOTE — Progress Notes (Signed)
   12/04/20 2257  Assess: MEWS Score  Temp (!) 101.8 F (38.8 C)  BP 128/77  Pulse Rate (!) 117  Resp 20  Level of Consciousness Alert  SpO2 97 %  O2 Device Room Air  Patient Activity (if Appropriate) In bed  Assess: MEWS Score  MEWS Temp 2  MEWS Systolic 0  MEWS Pulse 2  MEWS RR 0  MEWS LOC 0  MEWS Score 4  MEWS Score Color Red  Assess: if the MEWS score is Yellow or Red  Were vital signs taken at a resting state? Yes  Focused Assessment Change from prior assessment (see assessment flowsheet)  Early Detection of Sepsis Score *See Row Information* Medium  MEWS guidelines implemented *See Row Information* Yes  Treat  MEWS Interventions Escalated (See documentation below)  Take Vital Signs  Increase Vital Sign Frequency  Red: Q 1hr X 4 then Q 4hr X 4, if remains red, continue Q 4hrs  Escalate  MEWS: Escalate Red: discuss with charge nurse/RN and provider, consider discussing with RRT  Notify: Charge Nurse/RN  Name of Charge Nurse/RN Notified Joelene Millin RN  Date Charge Nurse/RN Notified 12/04/20  Time Charge Nurse/RN Notified 2257  Notify: Provider  Provider Name/Title Wynetta Emery  Date Provider Notified 12/04/20  Time Provider Notified 2300  Notification Type Page  Notification Reason Change in status  Provider response No new orders  Document  Patient Outcome Other (Comment) (temp and pulse have went down, pt is now a green mews)  Progress note created (see row info) Yes

## 2020-12-05 NOTE — Progress Notes (Signed)
Dr. Coy Saunas came to the unit and was made aware of pt's MEWS status. Dr. Wynetta Emery that was paged is not here tonight. No new orders d/t pt being a green mews now. RN will continue to monitor pt.   Jason Neighbor, RN

## 2020-12-06 LAB — BASIC METABOLIC PANEL
Anion gap: 10 (ref 5–15)
BUN: 14 mg/dL (ref 8–23)
CO2: 25 mmol/L (ref 22–32)
Calcium: 8.4 mg/dL — ABNORMAL LOW (ref 8.9–10.3)
Chloride: 100 mmol/L (ref 98–111)
Creatinine, Ser: 1.04 mg/dL (ref 0.61–1.24)
GFR, Estimated: >60 mL/min (ref >60)
Glucose, Bld: 109 mg/dL — ABNORMAL HIGH (ref 70–99)
Potassium: 4.2 mmol/L (ref 3.5–5.1)
Sodium: 135 mmol/L (ref 135–145)

## 2020-12-06 LAB — CBC
HCT: 25 % — ABNORMAL LOW (ref 39.0–52.0)
Hemoglobin: 8 g/dL — ABNORMAL LOW (ref 13.0–17.0)
MCH: 28.1 pg (ref 26.0–34.0)
MCHC: 32 g/dL (ref 30.0–36.0)
MCV: 87.7 fL (ref 80.0–100.0)
Platelets: 660 10*3/uL — ABNORMAL HIGH (ref 150–400)
RBC: 2.85 MIL/uL — ABNORMAL LOW (ref 4.22–5.81)
RDW: 14.2 % (ref 11.5–15.5)
WBC: 15.9 10*3/uL — ABNORMAL HIGH (ref 4.0–10.5)
nRBC: 0 % (ref 0.0–0.2)

## 2020-12-06 LAB — GLUCOSE, CAPILLARY: Glucose-Capillary: 91 mg/dL (ref 70–99)

## 2020-12-06 MED ORDER — LOPERAMIDE HCL 2 MG PO CAPS: 2.0000 mg | ORAL_CAPSULE | ORAL | 0 refills | Status: AC | PRN

## 2020-12-06 MED ORDER — ACETAMINOPHEN 325 MG PO TABS
650.0000 mg | ORAL_TABLET | Freq: Four times a day (QID) | ORAL | 0 refills | Status: AC | PRN
Start: 2020-12-06 — End: ?

## 2020-12-06 MED ORDER — ASPIRIN 81 MG PO TBEC: 81.0000 mg | DELAYED_RELEASE_TABLET | Freq: Every day | ORAL | 11 refills | Status: AC

## 2020-12-06 MED ORDER — MESALAMINE 1000 MG RE SUPP: 1000.0000 mg | Freq: Every day | RECTAL | 12 refills | Status: AC

## 2020-12-06 MED ORDER — COLLAGENASE 250 UNIT/GM EX OINT: TOPICAL_OINTMENT | Freq: Every day | CUTANEOUS | 0 refills | Status: AC

## 2020-12-06 MED ORDER — HYDROCORTISONE ACETATE 25 MG RE SUPP: 25.0000 mg | Freq: Every day | RECTAL | 0 refills | Status: AC

## 2020-12-06 MED ORDER — ENSURE ENLIVE PO LIQD: 237.0000 mL | Freq: Three times a day (TID) | ORAL | 12 refills | Status: AC

## 2020-12-06 MED ORDER — BICTEGRAVIR-EMTRICITAB-TENOFOV 50-200-25 MG PO TABS: 1.0000 | ORAL_TABLET | Freq: Every day | ORAL | 0 refills | Status: AC

## 2020-12-06 MED ORDER — MORPHINE SULFATE (CONCENTRATE) 10 MG/0.5ML PO SOLN: 10.0000 mg | ORAL | 0 refills | Status: AC | PRN

## 2020-12-06 NOTE — Progress Notes (Signed)
Subjective:   O/N Events: Fever 100.4 F  Patient laying in bed this morning. Eyes reactive to voice. Not speaking or following commands. Appears in no acute distress.  Objective:  Vital signs in last 24 hours: Vitals:   12/05/20 0944 12/05/20 1744 12/05/20 1952 12/06/20 0445  BP: 132/64 121/67 132/83 137/66  Pulse: (!) 105 99 (!) 124 (!) 107  Resp: 18 16 17 18   Temp: 99 F (37.2 C) 98.8 F (37.1 C) 98.6 F (37 C) (!) 100.4 F (38 C)  TempSrc:  Oral Oral Oral  SpO2: 97% 98% 99% 97%  Weight:      Height:       Physical Exam Constitutional:      Appearance: He is ill-appearing. He is not toxic-appearing.     Comments: Chronically ill appearing  Cardiovascular:     Rate and Rhythm: Normal rate and regular rhythm.  Pulmonary:     Effort: Pulmonary effort is normal. No respiratory distress.     Breath sounds: Normal breath sounds. No stridor. No wheezing, rhonchi or rales.  Abdominal:     General: Abdomen is flat. Bowel sounds are normal.     Tenderness: There is no abdominal tenderness. There is no guarding.  Neurological:     Mental Status: He is alert.    CBC Latest Ref Rng & Units 12/06/2020 12/03/2020 12/02/2020  WBC 4.0 - 10.5 K/uL 15.9(H) 9.8 11.4(H)  Hemoglobin 13.0 - 17.0 g/dL 8.0(L) 8.0(L) 8.0(L)  Hematocrit 39.0 - 52.0 % 25.0(L) 24.8(L) 24.8(L)  Platelets 150 - 400 K/uL 660(H) 562(H) 526(H)    BMP Latest Ref Rng & Units 12/06/2020 12/03/2020 12/02/2020  Glucose 70 - 99 mg/dL 109(H) 81 91  BUN 8 - 23 mg/dL 14 8 9   Creatinine 0.61 - 1.24 mg/dL 1.04 0.80 0.80  BUN/Creat Ratio 6 - 22 (calc) - - -  Sodium 135 - 145 mmol/L 135 131(L) 133(L)  Potassium 3.5 - 5.1 mmol/L 4.2 3.5 3.6  Chloride 98 - 111 mmol/L 100 98 100  CO2 22 - 32 mmol/L 25 25 22   Calcium 8.9 - 10.3 mg/dL 8.4(L) 8.4(L) 8.3(L)   Assessment/Plan:  Principal Problem:   Anoxic brain injury ([place]) Active Problems:   Goals of care, counseling/discussion   Acute encephalopathy   Pressure injury of  skin   Acute renal failure (HCC)   Traumatic rhabdomyolysis (HCC)   Bullous skin disease   Multiple cerebral infarctions (HCC)   Rectal cancer (HCC)   Acute combined systolic and diastolic heart failure (HCC)   Hyperkalemia   Tongue lesion   Protein-calorie malnutrition, severe   Hemorrhagic shock ([place])   Rectal ulceration  [name] is a 65 year old man with a history of AIDS and substance use admitted with acute encephalopathy found to have numerous new acute/subacute ischemic infarcts on MRI Brain. Hospital course complicated by hemorrhagic shock secondary to LGIB requiring brief stay in ICU. He is now hemodynamically stable and medically stable for discharge to SNF.  Acute encephalopathy Multiple acute/subacute ischemic infarcts - improving on imaging Hypoxic ischemic injury  Per family discussion on 4/29 patient transitioned to DNR and pending placement to [facility]. Will not escalate if he were to decompensate.  - DC to Inpatient hospice vs SNF with hospice pending be availability - ASA 81mg  - Dysphagia 3 diet  Acute LGIB complicated by hemorrhagic shock, resolved Multiple rectal ulcers 2/2 ischemia Continue supportive care with mesalamine and hydrocortisone suppositories per GI recommendation.  No further escalation of care given new  status changes.  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

## 2020-12-06 NOTE — Plan of Care (Signed)
  Problem: Activity: Goal: Risk for activity intolerance will decrease Outcome: Progressing   Problem: Nutrition: Goal: Adequate nutrition will be maintained Outcome: Progressing   

## 2020-12-06 NOTE — Progress Notes (Signed)
Physical Therapy Discharge Patient Details Name: [name] MRN: [id number] DOB: Sep 29, 1955 Today's Date: 12/06/2020 Time:  -     Patient discharged from PT services secondary to decreased level of arousal and Pt is awaiting placement at [facility]..  Please see latest therapy progress note for current level of functioning and progress toward goals.    Progress and discharge plan discussed with patient and/or caregiver: Patient unable to participate in discharge planning and no caregivers available  If pt becomes more responsive and would benefit from PT please reorder.  [name] B. [name] PT, DPT Acute Rehabilitation Services Pager [phone] Office [phone]       [name] 12/06/2020, 12:21 PM

## 2020-12-07 LAB — GLUCOSE, CAPILLARY: Glucose-Capillary: 131 mg/dL — ABNORMAL HIGH (ref 70–99)

## 2020-12-07 NOTE — Progress Notes (Signed)
Manufacturing engineer [facility]) Hospital Liaison note.    Received request from [place] for family interest in [facility] Chart and pt information under review by [facility] physician.  Hospice eligibility has been approved. Patient was offered a bed this morning and spoke with brother. He would like to do consents via docusign, but never heard back after the offer was made and confirmed. Still need consents done.  [facility] is unable to offer a room today. Hospital Liaison will follow up tomorrow or sooner if a room becomes available. Please do not hesitate to call with questions.    Thank you for the opportunity to participate in this patient's care.  Clementeen Hoof, BSN, [employer] [phone]

## 2020-12-07 NOTE — Progress Notes (Signed)
Occupational Therapy Discharge Patient Details Name: [name] MRN: [id number] DOB: Dec 27, 1955 Today's Date: 12/07/2020 Time:  -     Patient discharged from OT services secondary to pt awaiting hospice home d/c per chart, pt has less than 2 weeks for prognosis.  Please see latest therapy progress note for current level of functioning and progress toward goals.       Jefferey Pica, OTR/L Acute Rehabilitation Services Pager: [phone] Office: [place] 12/07/2020, 8:44 AM

## 2020-12-07 NOTE — Progress Notes (Signed)
Subjective:   O/N Events: Fever 102.6 F  Patient laying in bed this morning states he is fine when questioned. Otherwise is not interactive.   Objective:  Vital signs in last 24 hours: Vitals:   12/06/20 2237 12/07/20 0017 12/07/20 0434 12/07/20 0947  BP:   117/61 (!) 141/73  Pulse:   96 99  Resp:   18 18  Temp: (!) 102.6 F (39.2 C) 100 F (37.8 C) 99.4 F (37.4 C) 98.8 F (37.1 C)  TempSrc: Oral Oral Oral Oral  SpO2:   95% 98%  Weight:      Height:       Physical Exam Constitutional:      Appearance: He is ill-appearing. He is not toxic-appearing.     Comments: Chronically ill appearing  Cardiovascular:     Rate and Rhythm: Normal rate and regular rhythm.  Pulmonary:     Effort: Pulmonary effort is normal. No respiratory distress.     Breath sounds: Normal breath sounds. No stridor. No wheezing, rhonchi or rales.  Abdominal:     General: Abdomen is flat. Bowel sounds are normal.     Tenderness: There is no abdominal tenderness. There is no guarding.  Neurological:     Mental Status: He is alert.    CBC Latest Ref Rng & Units 12/06/2020 12/03/2020 12/02/2020  WBC 4.0 - 10.5 K/uL 15.9(H) 9.8 11.4(H)  Hemoglobin 13.0 - 17.0 g/dL 8.0(L) 8.0(L) 8.0(L)  Hematocrit 39.0 - 52.0 % 25.0(L) 24.8(L) 24.8(L)  Platelets 150 - 400 K/uL 660(H) 562(H) 526(H)    BMP Latest Ref Rng & Units 12/06/2020 12/03/2020 12/02/2020  Glucose 70 - 99 mg/dL 109(H) 81 91  BUN 8 - 23 mg/dL 14 8 9   Creatinine 0.61 - 1.24 mg/dL 1.04 0.80 0.80  BUN/Creat Ratio 6 - 22 (calc) - - -  Sodium 135 - 145 mmol/L 135 131(L) 133(L)  Potassium 3.5 - 5.1 mmol/L 4.2 3.5 3.6  Chloride 98 - 111 mmol/L 100 98 100  CO2 22 - 32 mmol/L 25 25 22   Calcium 8.9 - 10.3 mg/dL 8.4(L) 8.4(L) 8.3(L)   Assessment/Plan:  Principal Problem:   Anoxic brain injury ([place]) Active Problems:   Goals of care, counseling/discussion   Acute encephalopathy   Pressure injury of skin   Acute renal failure (HCC)   Traumatic  rhabdomyolysis (HCC)   Bullous skin disease   Multiple cerebral infarctions (HCC)   Rectal cancer (HCC)   Acute combined systolic and diastolic heart failure (HCC)   Hyperkalemia   Tongue lesion   Protein-calorie malnutrition, severe   Hemorrhagic shock ([place])   Rectal ulceration  [name] is a 65 year old man with a history of AIDS and substance use admitted with acute encephalopathy found to have numerous new acute/subacute ischemic infarcts on MRI Brain. Hospital course complicated by hemorrhagic shock secondary to LGIB requiring brief stay in ICU. He is now hemodynamically stable and medically stable for discharge to SNF.  Acute encephalopathy Multiple acute/subacute ischemic infarcts Hypoxic ischemic injury  Patient was offered bed at [facility] yesterday, unfortunately was unable to reach family for consent. Was able to reach family today but unfortunately no bed availability. Currently awaiting bed availability.   - DC to Inpatient hospice vs SNF with hospice pending bed availability - ASA 81mg  - Dysphagia 3 diet  Acute LGIB complicated by hemorrhagic shock, resolved Multiple rectal ulcers 2/2 ischemia Continue supportive care with mesalamine and hydrocortisone suppositories per GI recommendation.  No further escalation of care given new status  changes.  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

## 2020-12-07 NOTE — Plan of Care (Signed)
  Problem: Clinical Measurements: Goal: Ability to maintain clinical measurements within normal limits will improve Outcome: Progressing   Problem: Nutrition: Goal: Adequate nutrition will be maintained Outcome: Progressing   

## 2020-12-07 NOTE — Progress Notes (Signed)
SLP Cancellation Note  Patient Details Name: [name] MRN: [id number] DOB: 08-18-1955   Cancelled treatment:       Reason Eval/Treat Not Completed: Other (comment) Since SLP evaluation, pt/family have decided to pursue full comfort care. He is now awaiting bed availability at a hospice center. Will hold additional therapy at this time, continuing current diet with precautions and assistance (noting that with comfort feeds, diet can be adjusted per pt/family request). Please reorder if we can be of assistance.     Osie Bond., M.A. [facility] Pager [phone] Office [phone]  12/07/2020, 10:10 AM

## 2020-12-08 LAB — SARS CORONAVIRUS 2 (TAT 6-24 HRS): SARS Coronavirus 2: NEGATIVE

## 2020-12-08 NOTE — Progress Notes (Signed)
Manufacturing engineer [facility]) Hospital Liaison note.     This patient is approved to transfer to [facility] today pending a negative Covid test.   Family is scheduled to come to [facility] to complete consents at 3:30 today. [facility] will notify [facility] when registration paperwork has been completed to arrange transport.    RN please call report to [phone].   Thank you,     Farrel Gordon, RN, [facility]       [place] (listed on [facility] under Hospice[facility])     [phone]

## 2020-12-08 NOTE — Progress Notes (Signed)
Subjective:   O/N Events: Fever 102.9 F  Patient laying in bed continue to be non interactive. Does track with eyes and alert to voice. Appears mildly uncomfortable.   Objective:  Vital signs in last 24 hours: Vitals:   12/08/20 0738 12/08/20 0800 12/08/20 0900 12/08/20 1300  BP: 115/72 125/78 122/72 118/71  Pulse: (!) 110 (!) 107 (!) 107 (!) 101  Resp: 18 18 20 18   Temp: 99.5 F (37.5 C) 99 F (37.2 C) 98.2 F (36.8 C) 98 F (36.7 C)  TempSrc:   Oral Oral  SpO2: 97% 98% 99% 99%  Weight:      Height:       Physical Exam Constitutional:      Appearance: He is ill-appearing. He is not toxic-appearing.     Comments: Chronically ill appearing  Cardiovascular:     Rate and Rhythm: Normal rate and regular rhythm.  Pulmonary:     Effort: Pulmonary effort is normal. No respiratory distress.     Breath sounds: Normal breath sounds. No stridor. No wheezing, rhonchi or rales.  Abdominal:     General: Abdomen is flat. Bowel sounds are normal.     Tenderness: There is no abdominal tenderness. There is no guarding.  Neurological:     Mental Status: He is alert.    CBC Latest Ref Rng & Units 12/06/2020 12/03/2020 12/02/2020  WBC 4.0 - 10.5 K/uL 15.9(H) 9.8 11.4(H)  Hemoglobin 13.0 - 17.0 g/dL 8.0(L) 8.0(L) 8.0(L)  Hematocrit 39.0 - 52.0 % 25.0(L) 24.8(L) 24.8(L)  Platelets 150 - 400 K/uL 660(H) 562(H) 526(H)    BMP Latest Ref Rng & Units 12/06/2020 12/03/2020 12/02/2020  Glucose 70 - 99 mg/dL 109(H) 81 91  BUN 8 - 23 mg/dL 14 8 9   Creatinine 0.61 - 1.24 mg/dL 1.04 0.80 0.80  BUN/Creat Ratio 6 - 22 (calc) - - -  Sodium 135 - 145 mmol/L 135 131(L) 133(L)  Potassium 3.5 - 5.1 mmol/L 4.2 3.5 3.6  Chloride 98 - 111 mmol/L 100 98 100  CO2 22 - 32 mmol/L 25 25 22   Calcium 8.9 - 10.3 mg/dL 8.4(L) 8.4(L) 8.3(L)   Assessment/Plan:  Principal Problem:   Anoxic brain injury ([place]) Active Problems:   Goals of care, counseling/discussion   Acute encephalopathy   Pressure injury of  skin   Acute renal failure (HCC)   Traumatic rhabdomyolysis (HCC)   Bullous skin disease   Multiple cerebral infarctions (HCC)   Rectal cancer (HCC)   Acute combined systolic and diastolic heart failure (HCC)   Hyperkalemia   Tongue lesion   Protein-calorie malnutrition, severe   Hemorrhagic shock ([place])   Rectal ulceration  [name] is a 65 year old man with a history of AIDS and substance use admitted with acute encephalopathy found to have numerous new acute/subacute ischemic infarcts on MRI Brain. Hospital course complicated by hemorrhagic shock secondary to LGIB requiring brief stay in ICU. He is now hemodynamically stable and medically stable for discharge to SNF.  Acute encephalopathy Multiple acute/subacute ischemic infarcts Hypoxic ischemic injury  Bed available tomorrow morning pending COVID test. Hospice liaison will continue reach out to family for consent. Remains stable for discharge to hospice. - DC to Inpatient hospice vs SNF with hospice pending bed availability - ASA 81mg  - Dysphagia 3 diet  Acute LGIB complicated by hemorrhagic shock, resolved Multiple rectal ulcers 2/2 ischemia Continue supportive care with mesalamine and hydrocortisone suppositories per GI recommendation.  No further escalation of care given new status changes.  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

## 2020-12-09 DIAGNOSIS — S36119A Unspecified injury of liver, initial encounter: Secondary | ICD-10-CM

## 2020-12-09 DIAGNOSIS — I214 Non-ST elevation (NSTEMI) myocardial infarction: Secondary | ICD-10-CM

## 2020-12-09 DIAGNOSIS — I161 Hypertensive emergency: Secondary | ICD-10-CM

## 2020-12-09 NOTE — Discharge Summary (Signed)
Name: [name] MRN: [id number] DOB: 19-Jan-1956 65 y.o. PCP: [name], [name], MD  Date of Admission: 11/16/2020 11:01 AM Date of Discharge:  12/09/20 Attending Physician: Lucious Groves, DO  Discharge Diagnosis: 1. Acute Encephalopathy 2/2 Diffuse ischemic infarcts and hypoxic brain injury  2. AKI  3. Rhabdomyolysis  4. Hypertensive Emergency 5. Acute Liver Injury  6. Chronic hepatitis C  7. NSTEMI  8. Hemorrhagic Shock 2/2 Rectal Ulcers  Discharge Medications: Allergies as of 12/09/2020   No Known Allergies     Medication List    STOP taking these medications   clotrimazole 1 % cream Commonly known as: LOTRIMIN   HYDROcodone-acetaminophen 5-325 MG tablet Commonly known as: NORCO/VICODIN   mupirocin ointment 2 % Commonly known as: BACTROBAN   omeprazole 40 MG capsule Commonly known as: PRILOSEC   oxyCODONE-acetaminophen 5-325 MG tablet Commonly known as: PERCOCET/ROXICET   Symtuza 800-150-200-10 MG Tabs Generic drug: Darunavir-Cobicisctat-Emtricitabine-Tenofovir Alafenamide     TAKE these medications   acetaminophen 325 MG tablet Commonly known as: TYLENOL Take 2 tablets (650 mg total) by mouth every 6 (six) hours as needed for mild pain (or Fever >/= 101).   aspirin 81 MG EC tablet Take 1 tablet (81 mg total) by mouth daily. Swallow whole.   bictegravir-emtricitabine-tenofovir AF 50-200-25 MG Tabs tablet Commonly known as: BIKTARVY Take 1 tablet by mouth daily.   collagenase ointment Commonly known as: SANTYL Apply topically daily.   feeding supplement Liqd Take 237 mLs by mouth 3 (three) times daily between meals.   hydrocortisone 25 MG suppository Commonly known as: ANUSOL-HC Place 1 suppository (25 mg total) rectally daily.   loperamide 2 MG capsule Commonly known as: IMODIUM Take 1 capsule (2 mg total) by mouth as needed for diarrhea or loose stools.   mesalamine 1000 MG suppository Commonly known as: CANASA Place 1  suppository (1,000 mg total) rectally at bedtime.   morphine CONCENTRATE 10 MG/0.5ML Soln concentrated solution Place 0.5 mLs (10 mg total) under the tongue every 2 (two) hours as needed for moderate pain, severe pain, anxiety or shortness of breath.            Discharge Care Instructions  (From admission, onward)         Start     Ordered   12/09/20 0000  Discharge wound care:       Comments: For areas with intact or ruptured blisters, place Xeroform gauze Kellie Simmering [phone]) over these sites. If possible, secure with a few turns of kerlex.  If securing with kerlex is not possible, cover with a foam dressing.  Change daily.   12/09/20 0902   12/06/20 0000  Discharge wound care:       Comments: Redress wound withTransparent dressing. For areas with intact or ruptured blisters, place Xeroform gauze over these sites. If possible, secure with a few turns of kerlex.  If securing with kerlex is not possible, cover with a foam dressing.  Change daily.   12/06/20 1719          Disposition and follow-up:   Mr.[name] was discharged from [facility] in Stable condition.  At the hospital follow up visit please address:  1. Acute encephalopathy Multiple acute/subacute ischemic infarcts Hypoxic ischemic injury: Patient remains minimally responsive to voice and only intermittently interactive. Expect he will continue to decline. Continue on Asa 81 mg and dysphagia diet.  Discharge to hospice at [facility].  Hemorrhagic Shock 2/2 Rectal Ulcers: Continue on mesalamine  and hydrocortisone suppositories and morphine as needed for pain  HIV: Continue Biktarvy  Hypertension Holding BB and ARB  2.  Labs / imaging needed at time of follow-up: None  3.  Pending labs/ test needing follow-up: None  Follow-up Appointments:  Hospital Course by problem list: 1.Acute Encephalopathy  Patient presents with altered mental status and was unresponsive during our encounter.  Initially multiple etiologies suspected including metabolic encephalopathy secondary to uremia in setting of AKI, hepatic encephalopathy, infectious etiology given  immunocompromised state, and Wernicke encephalopathy, CT Head w/o contrast demonstrated area of possible new infarction in the parietal lobe .Follow-up MRI brain later showed multiple abnormalities including a 2.6 area of ischemic infarct in the right occipital lobe corresponding with prior CT findings, multiple subacute ischemic infarcts involving multiple vascular territories likely from embolic CVA, additional findings concerning for hypoxic ischemic injury. No evidence of seizure on EEG. Treated with high dose thiamine given alcohol history. Mental decline likely in the setting of multiple ischemic brain infarcts and hypoxic brain injury. Initially with some improvement in mental status but remained below baseline. However this worsened after developing acute GI bleeding and subsequent hemorrhagic shock. Mental status continued to decline following this as patient became less interactive, nonverbal, and minimal responsiveness to noxious stimuli.  MRI repeated showing multi focal subacute embolic infarcts consistent with findings on the admission MRI, no new acute infarcts, and changes in the bilateral basal ganglia suggestive of anoxic injury, which are likely permanent given persistent findings on repeat MRI. Goals of care discussions started with palliative care given lack of improvment. Per family discussion with palliative on 4/29 patient transitioned to DNR but remains full scope. Family ultimately would like for patient to go to hospice at [facility].  2. Lower GI bleeding On 4/22 he developed brisk lower GI bleed resulting in hemorrhagic shock requiring transfer to the ICU. He received 3 units pRBCs and 1 unit of platelets. IR was consulted for angiography and possible embolectomy, but there was no active extravasation identified. GI  performed bedside flex sig which showed several ulcers, but nothing to actively intervene on. By 4/24, his blood counts an hemodynamics stabilized and was transferred out of the ICU. DAPT was held due to LGIB intitially and subsequently restarted on ASA give multiple embolic infarcts on brain imaging. Biopsy pathology report from rectal ulcers with nonspecific findings although with focal changes suggestive of ischemia. Started on supportive care withmesalamine and hydrocortisone suppositories and avoidance of rectal tube to prevent rebleeding. No further bleeding during admission and patient Hgb remained stable following this.  3. AKI Hyperkalemia Rhabdomyolysis  Patient's presented  With Cr 5.21, GFR 11. Baseline Cr ~1.00, GFR>60. Patient's AKI is likely multifactorial secondary to pre-renal and intrinsic etiologies. Concern for hypotension leading to ATN. U/A demonstrated granular casts which is consistent with ATN as well as elevated CK, AST, ALT, and hyperkalemia with urine studies c/w rhabdomyolysis. EKG findings noted are consistent with acute hyperkalemia. Treated with IV fluids with improvement in potassium, kidney function, and CK.   4. AIDS Presented with low CD4 count of 197, down from 238 on 08/09/20. Patient has a history of not taking home Symtuza according to [place] due to concerns for possible SJS/TEN given bullae on physical exam. Started on [place] during admission.  5. 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 Likely related to cocaine use. This improved without intervention during admission and BP remained normotensive off medications. No signs  of fluid overload.  6. Bullous Lesions Patient presented with tense, bullous lesions on extensor surfaces of hands, knees bilaterally. 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 Bullous pemphigoid is  also possible given tense nature of bullae. On home Symtuza, which can cause stevens-johnson syndrome and this can present with fluid-filled bullae. SJS is unlikely as patient has taken this medication for extended period of time without complications and no skin sloughing noted. However he was switched to [facility] during admission. Workup delayed due to difficultly reaching family, given mental decline and ultimate decision to pursue hospice care patient was continued on supportive care with wound care and pain management.  Subjective: Patient laying in bed sleeping in no acute distress. No improvement in mental status, intermittently eating when fed per RN.   Discharge Exam:   Blood pressure 104/62, pulse (!) 102, temperature 99.9 F (37.7 C), temperature source Oral, resp. rate 18, height 5\' 7"  (1.702 m), weight 60.2 kg, SpO2 100 %. Constitutional:      Appearance: He is ill-appearing. He is not toxic-appearing.  Pulmonary:     Effort: Pulmonary effort is normal. No respiratory distress. .  Abdominal:     General: Abdomen is flat..  Neurological:     Mental Status: He is sleeping in bed. Generally tracks with eyes but not interactive.   Pertinent Labs, Studies, and Procedures:  CT Head Wo Contrast  Result Date: 11/16/2020 CLINICAL DATA:  Altered mental status. History of anorectal carcinoma. EXAM: CT HEAD WITHOUT CONTRAST TECHNIQUE: Contiguous axial images were obtained from the base of the skull through the vertex without intravenous contrast. COMPARISON:  Head CT 10/19/2008. FINDINGS: Brain: There is a rounded focus of hypoattenuation in the posterior right parietal lobe measuring 2.1 by 1.7 by 1.3 cm. Scattered hypoattenuation in the subcortical and periventricular deep white matter is noted. Remote lacunar infarctions in the basal ganglia are seen. No hemorrhage, hydrocephalus or midline shift. Vascular: Atherosclerosis noted. Skull: Intact.  No focal lesion. Sinuses/Orbits: Negative.  Other: None. IMPRESSION: Rounded focus of hypoattenuation in the posterior right parietal lobe may be due to an infarct but cannot be definitively characterized. Brain MRI with and without contrast is recommended for further evaluation. Chronic microvascular ischemic change. Electronically Signed   By: Inge Rise M.D.   On: 11/16/2020 14:43   MR BRAIN WO CONTRAST  Result Date: 11/16/2020 CLINICAL DATA:  Initial evaluation for acute mental status change, unknown cause. EXAM: MRI HEAD WITHOUT CONTRAST TECHNIQUE: Multiplanar, multiecho pulse sequences of the brain and surrounding structures were obtained without intravenous contrast. COMPARISON:  Prior head CT from earlier the same day. FINDINGS: Brain: Diffuse prominence of the CSF containing spaces compatible with generalized cerebral atrophy. Patchy T2/FLAIR hyperintensity within the periventricular and deep white matter both cerebral hemispheres as well as the pons, consistent with chronic microvascular ischemic disease, moderate in nature. 2.6 area of restricted diffusion involving the cortical and subcortical aspect of the right occipital pole, consistent with acute to early subacute ischemic infarct (series 5, image 76). Finding corresponds with abnormality on prior CT. Associated mild petechial hemorrhage without frank hemorrhagic transformation or significant mass effect. Multiple additional scattered foci of restricted diffusion involving the cortical and subcortical aspect of the bilateral cerebral hemispheres are seen, also consistent with acute ischemic changes (series 5, images 91, 86, 79). Patchy involvement of the caudate and lentiform nuclei noted bilaterally. Few additional scattered punctate ischemic infarcts noted involving the bilateral cerebellar hemispheres. No  associated hemorrhage or significant mass effect. Additionally, there is abnormal symmetric restricted diffusion and FLAIR signal abnormality involving the globus pallidi  bilaterally (series 5, image 75). Appearance is most typical of a superimposed hypoxic ischemic injury versus toxic metabolic derangement. Additionally, there is abnormal restricted diffusion involving the mesial left temporal lobe with involvement of the left hippocampus (series 5, image 70). While this finding may be ischemic in nature, superimposed changes of seizure could also be considered. No mass lesion, significant mass effect, or midline shift. No hydrocephalus or extra-axial fluid collection. Pituitary gland and suprasellar region normal. Midline structures intact. Vascular: Major intracranial vascular flow voids are maintained. Skull and upper cervical spine: Craniocervical junction within normal limits. Signal intensity within the visualized bone marrow of the upper cervical spine is diffusely heterogeneous and decreased on T1 weighted imaging, nonspecific, but most commonly related to anemia, smoking, or obesity. No focal marrow replacing lesion. No scalp soft tissue abnormality. Sinuses/Orbits: Globes and orbital soft tissues demonstrate no acute finding. Scattered mucosal thickening noted throughout the paranasal sinuses. No significant mastoid effusion. Inner ear structures grossly normal. Other: None. IMPRESSION: 1. 2.6 area of acute to early subacute ischemic infarct involving the right occipital pole, corresponding with abnormality on prior CT. Associated mild petechial hemorrhage without frank hemorrhagic transformation or significant mass effect. 2. Multiple additional scattered acute to early subacute ischemic infarcts involving the cortical and subcortical aspect of both cerebral and cerebellar hemispheres. Overall, a central thromboembolic etiology is suspected given the various vascular distributions involved. 3. Additional abnormal symmetric restricted diffusion involving the globus pallidi bilaterally. Appearance is most characteristic of a superimposed/concomitant hypoxic ischemic injury  versus toxic metabolic derangement. 4. Restricted diffusion involving the mesial left temporal lobe with involvement of the left hippocampus. While this finding may be ischemic in nature as well, superimposed changes of seizure and/or limbic encephalitis could also be considered. Correlation with EEG recommended. 5. Underlying age-related cerebral atrophy with moderate chronic microvascular ischemic disease. Electronically Signed   By: Jeannine Boga M.D.   On: 11/16/2020 21:26   DG Chest Portable 1 View  Result Date: 11/16/2020 CLINICAL DATA:  Altered mental status EXAM: PORTABLE CHEST 1 VIEW COMPARISON:  08/24/2020 FINDINGS: Cardiac shadow is within normal limits. Tortuous thoracic aorta is noted accentuated by patient rotation. The lungs are clear bilaterally. Old rib fractures on the left are seen and stable. No new focal abnormality is noted. IMPRESSION: No acute abnormality seen. Electronically Signed   By: Inez Catalina M.D.   On: 11/16/2020 12:00   EEG adult  Result Date: 11/17/2020 Derek Jack, MD     11/17/2020  8:00 AM Routine EEG Report Aadit Satvik Parco is a 65 y.o. male with a history of acute encephalopathy who is undergoing an EEG to evaluate for seizures. Report: This EEG was acquired with electrodes placed according to the International 10-20 electrode system (including Fp1, Fp2, F3, F4, C3, C4, P3, P4, O1, O2, T3, T4, T5, T6, A1, A2, Fz, Cz, Pz). The following electrodes were missing or displaced: none. The best background activity was 5-6 Hz with intermittent delta slowing. This activity is reactive to stimulation. Sleep was characterized by vertex waves, sleep spindles (12 to 14 Hz). There was no focal slowing. There were no interictal epileptiform discharges. There were no electrographic seizures identified. Photic stimulation and hyperventilation were not performed. Impression and clinical correlation: This EEG was obtained while obtunded and is abnormal due to moderate  diffuse slowing suggestive of general cerebral dysfunction. Su Monks,  MD [facility] [phone] If 7pm- 7am, please page neurology on call as listed in [place].     ECHOCARDIOGRAM LIMITED  Result Date: 11/17/2020    ECHOCARDIOGRAM LIMITED REPORT   Patient Name:   AKINTUNDE BAYLEY Barrientes Date of Exam: 11/17/2020 Medical Rec #:  [id number]          Height:       67.0 in Accession #:    [id number]         Weight:       134.0 lb Date of Birth:  1956/07/07          BSA:          1.706 m Patient Age:    78 years           BP:           163/89 mmHg Patient Gender: M                  HR:           74 bpm. Exam Location:  Inpatient Procedure: Limited Echo, Limited Color Doppler and Cardiac Doppler Indications:    Elevated Troponin  History:        Patient has no prior history of Echocardiogram examinations.                 Risk Factors:Hypertension and ETOH.  Sonographer:    Bernadene Person RDCS Referring Phys: [id number] [place]  1. Left ventricular ejection fraction, by estimation, is 45 to 50%. The left ventricle has mildly decreased function. The left ventricle demonstrates regional wall motion abnormalities (see scoring diagram/findings for description). There is hypokinesis  of the anterior and anteroseptal LV wall segments. There is moderate concentric left ventricular hypertrophy. Left ventricular diastolic parameters are consistent with Grade I diastolic dysfunction (impaired relaxation).  2. Right ventricular systolic function is normal. The right ventricular size is normal. There is normal pulmonary artery systolic pressure.  3. Moderate pleural effusion in the left lateral region.  4. The mitral valve is normal in structure. Trivial mitral valve regurgitation.  5. The aortic valve is tricuspid. There is mild calcification of the aortic valve. There is mild thickening of the aortic valve. Aortic valve regurgitation is mild. Mild aortic valve sclerosis is present, with no evidence of  aortic valve stenosis.  6. The inferior vena cava is normal in size with greater than 50% respiratory variability, suggesting right atrial pressure of 3 mmHg.  7. There is mildly reduced LVEF with anterior and anteroseptal wall motion abnormalities concerning for possible LAD lesion. . Comparison(s): No prior Echocardiogram. FINDINGS  Left Ventricle: Left ventricular ejection fraction, by estimation, is 45 to 50%. The left ventricle has mildly decreased function. The left ventricle demonstrates regional wall motion abnormalities. There is hypokinesis of the anterior and anteroseptal LV wall segments. The left ventricular internal cavity size was normal in size. There is moderate concentric left ventricular hypertrophy. Left ventricular diastolic parameters are consistent with Grade I diastolic dysfunction (impaired relaxation). Normal left ventricular filling pressure. Right Ventricle: The right ventricular size is normal. No increase in right ventricular wall thickness. Right ventricular systolic function is normal. There is normal pulmonary artery systolic pressure. The tricuspid regurgitant velocity is 2.17 m/s, and  with an assumed right atrial pressure of 3 mmHg, the estimated right ventricular systolic pressure is 123XX123 mmHg. Pericardium: There is no evidence of pericardial effusion. Mitral Valve: The mitral valve is normal in structure. There is mild thickening of the  mitral valve leaflet(s). Mild mitral annular calcification. Trivial mitral valve regurgitation. Tricuspid Valve: The tricuspid valve is normal in structure. Tricuspid valve regurgitation is trivial. Aortic Valve: The aortic valve is tricuspid. There is mild calcification of the aortic valve. There is mild thickening of the aortic valve. Aortic valve regurgitation is mild. Aortic regurgitation PHT measures 569 msec. Mild aortic valve sclerosis is present, with no evidence of aortic valve stenosis. Pulmonic Valve: The pulmonic valve was not well  visualized. Pulmonic valve regurgitation is not visualized. Venous: The inferior vena cava is normal in size with greater than 50% respiratory variability, suggesting right atrial pressure of 3 mmHg. Additional Comments: There is a moderate pleural effusion in the left lateral region. LEFT VENTRICLE PLAX 2D LVIDd:         4.40 cm     Diastology LVIDs:         3.40 cm     LV e' medial:    7.76 cm/s LV PW:         1.30 cm     LV E/e' medial:  6.4 LV IVS:        1.10 cm     LV e' lateral:   10.00 cm/s LVOT diam:     2.20 cm     LV E/e' lateral: 4.9 LV SV:         63 LV SV Index:   37 LVOT Area:     3.80 cm  LV Volumes (MOD) LV vol d, MOD A2C: 64.6 ml LV vol d, MOD A4C: 71.3 ml LV vol s, MOD A2C: 31.4 ml LV vol s, MOD A4C: 36.3 ml LV SV MOD A2C:     33.2 ml LV SV MOD A4C:     71.3 ml LV SV MOD BP:      33.7 ml RIGHT VENTRICLE RV S prime:     11.70 cm/s TAPSE (M-mode): 2.1 cm LEFT ATRIUM         Index LA diam:    2.30 cm 1.35 cm/m  AORTIC VALVE LVOT Vmax:   90.10 cm/s LVOT Vmean:  54.100 cm/s LVOT VTI:    0.167 m AI PHT:      569 msec  AORTA Ao Root diam: 3.20 cm MITRAL VALVE               TRICUSPID VALVE MV Area (PHT): 3.27 cm    TR Peak grad:   18.8 mmHg MV Decel Time: 232 msec    TR Vmax:        217.00 cm/s MV E velocity: 49.30 cm/s MV A velocity: 53.60 cm/s  SHUNTS MV E/A ratio:  0.92        Systemic VTI:  0.17 m                            Systemic Diam: 2.20 cm Gwyndolyn Kaufman MD Electronically signed by Gwyndolyn Kaufman MD Signature Date/Time: 11/17/2020/10:24:55 AM    Final     Discharge Instructions: Discharge Instructions    Ambulatory referral to Neurology   Complete by: As directed    Follow up with Dr. Leonie Man at [facility] in 4-6 weeks. Too complicated for NP to follow. Thanks.   Diet - low sodium heart healthy   Complete by: As directed    Discharge instructions   Complete by: As directed    You were hospitalized for altered mental status due to multiple ischemic brain infarcts and anoxic brain  injury worsened and lower GI bleeding. Mental status has not improved and patient becoming less interactive and responsive. Will be discharged to hospice to [facility]. Thank you for allowing [place] to be part of your care.  Coninue on comfort mesures including feeding supplment, morphine, and loperimide. Continue aspiring, biktarvy, mesalamine, and hydrocortisone suppositories.   Discharge instructions   Complete by: As directed    You were hospitalized for altered mental status due to multiple ischemic brain infarcts and anoxic brain injury worsened and lower GI bleeding. Mental status has not improved and patient becoming less interactive and responsive. Will be discharged to hospice to [facility]. Thank you for allowing [place] to be part of your care.  Coninue on comfort mesures including feeding supplment, morphine, and loperimide. Continue aspiring, biktarvy, mesalamine, and hydrocortisone suppositories.   Discharge wound care:   Complete by: As directed    Redress wound withTransparent dressing. For areas with intact or ruptured blisters, place Xeroform gauze over these sites. If possible, secure with a few turns of kerlex.  If securing with kerlex is not possible, cover with a foam dressing.  Change daily.   Discharge wound care:   Complete by: As directed    For areas with intact or ruptured blisters, place Xeroform gauze Kellie Simmering [phone]) over these sites. If possible, secure with a few turns of kerlex.  If securing with kerlex is not possible, cover with a foam dressing.  Change daily.   Increase activity slowly   Complete by: As directed       Signed: [name], MD Internal Medicine PGY-1 12/09/20 1:15 PM

## 2020-12-09 NOTE — Progress Notes (Signed)
Nutrition Brief Note  Chart reviewed. Pt now transitioning to comfort care.  No further nutrition interventions planned at this time.  Please re-consult as needed.   Brendaly Townsel, MS, RD, LDN RD pager number and weekend/on-call pager number located in Amion.    

## 2020-12-09 NOTE — Progress Notes (Signed)
DISCHARGE NOTE [facility] Don[facility] Radziewicz to be discharged to [facility] per MD order.   Skin clean, dry no evidence of skin tears noted. No complaints noted.  Patient discharged with internal urinary foley catether.   Discharge packet assembled. An After Visit Summary (AVS) was printed and given to the EMS personnel. Patient escorted via stretcher and discharged to [facility] via ambulance. Report called to accepting facility; all questions and concerns addressed.   Mikki Santee, RN

## 2020-12-09 NOTE — TOC Transition Note (Signed)
Transition of Care [facility]) - CM/SW Discharge Note *Discharged to [facility]   Patient Details  Name: [name] MRN: [id number] Date of Birth: 10/26/1955  Transition of Care [facility]) CM/SW Contact:  Sable Feil, LCSW Phone Number: 12/09/2020, 1:49 PM   Clinical Narrative: Patient discharging to [facility] today, transported by non-emergency ambulance transport. Judeen Hammans with [facility] informed that discharge clinicals completed by MD and she is able to access this information. Nurse called report to facility.  Call made to brother [name] [phone] home #) and HIPPA compliant message left. Call made to sister [name] [phone]) and informed her of patient's discharge and awaiting ambulance transport.     Final next level of care: [place] ([place]) Barriers to Discharge: Barriers Resolved   Patient Goals and CMS Choice Patient states their goals for this hospitalization and ongoing recovery are:: Family in agreement with patient discharge to a hospice faciity CMS Medicare.gov Compare Post Acute Care list provided to:: Other (Comment Required) (Family informed regarding [facility] in [place]) Choice offered to / list presented to : Sibling  Discharge Placement              Patient chooses bed at: Other - please specify in the comment section below: ([place]) Patient to be transferred to facility by: Non-emergency ambulance transport Name of family member notified: Sister [name] - [phone] Patient and family notified of of transfer: 12/09/20  Discharge Plan and Services In-house Referral: Clinical Social Work   Post Acute Care Choice: [place]                              Social Determinants of Health (SDOH) Interventions  No SDOH interventions requested or needed at discharge   Readmission Risk Interventions No flowsheet data  found.

## 2020-12-09 NOTE — Plan of Care (Signed)
  Problem: Education: Goal: Knowledge of General Education information will improve Description: Including pain rating scale, medication(s)/side effects and non-pharmacologic comfort measures Outcome: Progressing   Problem: Health Behavior/Discharge Planning: Goal: Ability to manage health-related needs will improve Outcome: Progressing   Problem: Clinical Measurements: Goal: Ability to maintain clinical measurements within normal limits will improve Outcome: Progressing Goal: Will remain free from infection Outcome: Progressing Goal: Diagnostic test results will improve Outcome: Progressing Goal: Respiratory complications will improve Outcome: Progressing Goal: Cardiovascular complication will be avoided Outcome: Progressing   Problem: Activity: Goal: Risk for activity intolerance will decrease Outcome: Progressing   Problem: Nutrition: Goal: Adequate nutrition will be maintained Outcome: Progressing   Problem: Coping: Goal: Level of anxiety will decrease Outcome: Progressing   Problem: Elimination: Goal: Will not experience complications related to bowel motility Outcome: Progressing Goal: Will not experience complications related to urinary retention Outcome: Progressing   Problem: Pain Managment: Goal: General experience of comfort will improve Outcome: Progressing   Problem: Safety: Goal: Ability to remain free from injury will improve Outcome: Progressing   Problem: Skin Integrity: Goal: Risk for impaired skin integrity will decrease Outcome: Progressing   Problem: Education: Goal: Knowledge of disease or condition will improve Outcome: Progressing Goal: Knowledge of secondary prevention will improve Outcome: Progressing Goal: Knowledge of patient specific risk factors addressed and post discharge goals established will improve Outcome: Progressing Goal: Individualized Educational Video(s) Outcome: Progressing   Problem: Coping: Goal: Will identify  appropriate support needs Outcome: Progressing   Problem: Self-Care: Goal: Verbalization of feelings and concerns over difficulty with self-care will improve Outcome: Progressing Goal: Ability to communicate needs accurately will improve Outcome: Progressing   Problem: Nutrition: Goal: Risk of aspiration will decrease Outcome: Progressing Goal: Dietary intake will improve Outcome: Progressing   Problem: Intracerebral Hemorrhage Tissue Perfusion: Goal: Complications of Intracerebral Hemorrhage will be minimized Outcome: Progressing   Problem: Ischemic Stroke/TIA Tissue Perfusion: Goal: Complications of ischemic stroke/TIA will be minimized Outcome: Progressing   Problem: Spontaneous Subarachnoid Hemorrhage Tissue Perfusion: Goal: Complications of Spontaneous Subarachnoid Hemorrhage will be minimized Outcome: Progressing

## 2020-12-09 NOTE — Progress Notes (Signed)
Manufacturing engineer [facility]) Hospital Liaison Note:  Update on [place] Referral:  Patient family has completed [facility] and with Covid negative test result - [facility] is ready for patient to transfer as soon as TOC can arrange transportation to facility.  Please have bedside RN call report to [phone].  Thank you for allowing [place] to participate in this patient's care,  Gar Ponto, RN [facility] HLT [phone]

## 2020-12-10 ENCOUNTER — Ambulatory Visit: Payer: Medicare Other | Admitting: General Practice

## 2020-12-15 ENCOUNTER — Other Ambulatory Visit (HOSPITAL_COMMUNITY): Payer: Self-pay

## 2020-12-16 LAB — FUNGUS CULTURE RESULT

## 2020-12-16 LAB — FUNGUS CULTURE WITH STAIN

## 2020-12-16 LAB — FUNGAL ORGANISM REFLEX

## 2021-01-05 NOTE — Progress Notes (Deleted)
Cardiology Clinic Note   Patient Name: [name] Date of Encounter: 12/09/2020  Primary Care Provider:  Tommy Garrison, Jason Islam, MD Primary Cardiologist:  Donato Heinz, MD  Patient Profile    [name] 65 year old male presents the clinic today for follow-up evaluation of his cardiomyopathy with mildly reduced EF.  Past Medical History    Past Medical History:  Diagnosis Date  . Acute kidney failure    unspec, secondary to medications  . AIDS ([place]) 06/07/2016  . Alcohol abuse 05/03/2016  . anal ca dx'd 02/2011  . Cachexia ([place])   . 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 Drug abuse ([place])   . GERD (gastroesophageal reflux disease) 08/09/2016  . Grieving 11/29/2017  . Hemorrhoid   . Hepatitis C   . History of radiation therapy 01/01/2011 -05/18/11   anal, pelvis, inguinal lymph nodes  . HIV (human immunodeficiency virus infection) ([place])   . HIV or AIDS   . Housing problems 10/22/2020  . Hypertension   . Loose stools 06/07/2016  . Mass of anus 01/26/2011   squamous cell cancer  . Meningitis   . Mood disorder ([place])   . Pain    right leg  . Pulmonary nodule   . Rash and nonspecific skin eruption 03/31/2015  . Rectal bleeding 08/09/2020  . Testicular hypofunction    Past Surgical History:  Procedure Laterality Date  . BIOPSY  11/27/2020   Procedure: BIOPSY;  Surgeon: Mauri Pole, MD;  Location: [facility] ENDOSCOPY;  Service: Endoscopy;;  . BUBBLE STUDY  11/19/2020   Procedure: BUBBLE STUDY;  Surgeon: [name], MD;  Location: [facility];  Service: Cardiology;;  . Otho Darner SIGMOIDOSCOPY N/A 11/27/2020   Procedure: FLEXIBLE SIGMOIDOSCOPY;  Surgeon: Mauri Pole, MD;  Location: [facility] ENDOSCOPY;  Service: Endoscopy;  Laterality: N/A;  Bedside  . IR ANGIOGRAM PELVIS SELECTIVE OR SUPRASELECTIVE  11/27/2020  . IR ANGIOGRAM PELVIS SELECTIVE OR SUPRASELECTIVE  11/27/2020  . IR ANGIOGRAM VISCERAL SELECTIVE  11/27/2020  . IR US GUIDE VASC ACCESS RIGHT  11/27/2020  . MASS  EXCISION  01/26/2011   squamous cell carcinoma  . TEE WITHOUT CARDIOVERSION N/A 11/19/2020   Procedure: TRANSESOPHAGEAL ECHOCARDIOGRAM (TEE);  Surgeon: [name], MD;  Location: [facility];  Service: Cardiology;  Laterality: N/A;    Allergies  No Known Allergies  History of Present Illness    [name] has a PMH of AIDS, hep C, polysubstance use, squamous cell carcinoma of the rectum, altered mental status, and cardiomyopathy with mildly reduced EF.  He was admitted to the hospital 11/16/20 and discharged on 11/28/2020.  He was admitted with altered mental status after being found down at his home next to a can of beer and snacks.  An MRI showed extensive acute cerebral vascular ischemia.  He underwent a lumbar puncture and infectious disease was consulted.  For concerns of possible HSV encephalitis.  Cardiology was consulted for further evaluation of his abnormal echocardiogram that showed LVEF 45-50% G1 DD, trivial mitral valve regurgitation, mild aortic valve calcification, mild aortic regurgitation.  His TEE showed LVEF 45-50%, trivial mitral valve regurgitation, mild aortic valve regurgitation, no left atrial appendage and no intra cardiac source for embolism.  He was also noted to have a single 5 beat run of NSVT.  His electrolytes were evaluated carvedilol was recommended.  On admission he was noted to have elevated blood pressure.  During admission his blood pressure was allowed to be elevated due to the acute setting of  CVA.  He presents the clinic today for follow-up evaluation states***  *** denies chest pain, shortness of breath, lower extremity edema, fatigue, palpitations, melena, hematuria, hemoptysis, diaphoresis, weakness, presyncope, syncope, orthopnea, and PND.   Home Medications    Prior to Admission medications   Medication Sig Start Date End Date Taking? Authorizing Provider  acetaminophen (TYLENOL) 325 MG tablet Take 2 tablets (650 mg total) by mouth every 6  (six) hours as needed for mild pain (or Fever >/= 101). 12/06/20   [name], MD  aspirin EC 81 MG EC tablet Take 1 tablet (81 mg total) by mouth daily. Swallow whole. 12/07/20   [name], MD  bictegravir-emtricitabine-tenofovir AF (BIKTARVY) 50-200-25 MG TABS tablet Take 1 tablet by mouth daily. 12/06/20   [name], MD  clotrimazole (LOTRIMIN) 1 % cream Apply to affected area 2 times daily 10/06/19   Faustino Congress, NP  collagenase (SANTYL) ointment Apply topically daily. 12/07/20   [name], MD  Darunavir-Cobicisctat-Emtricitabine-Tenofovir Alafenamide [facility]) 800-150-200-10 MG TABS Take 1 tablet by mouth daily with breakfast. 04/21/20   [name], Jason Islam, MD  feeding supplement (ENSURE ENLIVE / ENSURE PLUS) LIQD Take 237 mLs by mouth 3 (three) times daily between meals. 12/06/20   [name], MD  HYDROcodone-acetaminophen (NORCO/VICODIN) 5-325 MG tablet TAKE 1 TABLET BY MOUTH EVERY 8 HOURS AS NEEDED FOR UP TO 5 DAYS FOR SEVERE PAIN NOT IMPROVED BY YOUR SCHEDULED ACETAMINOPHEN -NO MORE THAN 4000MG  PER DAY 08/25/20 02/21/21  Cardama, Grayce Sessions, MD  hydrocortisone (ANUSOL-HC) 25 MG suppository Place 1 suppository (25 mg total) rectally daily. 12/07/20   [name], MD  loperamide (IMODIUM) 2 MG capsule Take 1 capsule (2 mg total) by mouth as needed for diarrhea or loose stools. 12/06/20   [name], MD  mesalamine (CANASA) 1000 MG suppository Place 1 suppository (1,000 mg total) rectally at bedtime. 12/06/20   [name], MD  Morphine Sulfate (MORPHINE CONCENTRATE) 10 MG/0.5ML SOLN concentrated solution Place 0.5 mLs (10 mg total) under the tongue every 2 (two) hours as needed for moderate pain, severe pain, anxiety or shortness of breath. 12/06/20   [name], MD  mupirocin ointment (BACTROBAN) 2 % APPLY TOPICALLY 2 (TWO) TIMES DAILY FOR 10 DAYS. APPLY TO HEAD OF PENIS 08/25/20 08/25/21  Cardama, Grayce Sessions, MD  omeprazole (PRILOSEC) 40 MG capsule Take 1 capsule (40  mg total) by mouth daily. 08/04/19   Golden Circle, FNP  oxyCODONE-acetaminophen (PERCOCET/ROXICET) 5-325 MG tablet Take 2 tablets by mouth every 6 (six) hours as needed for severe pain. 10/23/20   Noemi Chapel, MD  oxyCODONE-acetaminophen (PERCOCET/ROXICET) 5-325 MG tablet TAKE 2 TABLETS BY MOUTH EVERY 6 HOURS AS NEEDED FOR SEVERE PAIN. 10/23/20 04/21/21  Domenic Moras, PA-C    Family History    Family History  Family history unknown: Yes   He indicated that his mother is deceased. He indicated that his father is alive. He indicated that both of his sisters are deceased. He indicated that all of his five brothers are alive.  Social History    Social History   Socioeconomic History  . Marital status: Single    Spouse name: Not on file  . Number of children: Not on file  . Years of education: Not on file  . Highest education level: Not on file  Occupational History  . Not on file  Tobacco Use  . Smoking status: Current Every Day Smoker  . Smokeless tobacco: Never Used  . Tobacco comment: 2-3 day  Vaping  Use  . Vaping Use: Never used  Substance and Sexual Activity  . Alcohol use: Yes    Alcohol/week: 6.0 standard drinks    Types: 6 Cans of beer per week  . Drug use: Not Currently  . Sexual activity: Not Currently    Partners: Male    Birth control/protection: Condom    Comment: given condoms  Other Topics Concern  . Not on file  Social History Narrative   ** Merged History Encounter **       Social Determinants of Health   Financial Resource Strain: Not on file  Food Insecurity: Not on file  Transportation Needs: Not on file  Physical Activity: Not on file  Stress: Not on file  Social Connections: Not on file  Intimate Partner Violence: Not on file     Review of Systems    General:  No chills, fever, night sweats or weight changes.  Cardiovascular:  No chest pain, dyspnea on exertion, edema, orthopnea, palpitations, paroxysmal nocturnal  dyspnea. Dermatological: No rash, lesions/masses Respiratory: No cough, dyspnea Urologic: No hematuria, dysuria Abdominal:   No nausea, vomiting, diarrhea, bright red blood per rectum, melena, or hematemesis Neurologic:  No visual changes, wkns, changes in mental status. All other systems reviewed and are otherwise negative except as noted above.  Physical Exam    VS:  There were no vitals taken for this visit. , BMI There is no height or weight on file to calculate BMI. GEN: Well nourished, well developed, in no acute distress. HEENT: normal. Neck: Supple, no JVD, carotid bruits, or masses. Cardiac: RRR, no murmurs, rubs, or gallops. No clubbing, cyanosis, edema.  Radials/DP/PT 2+ and equal bilaterally.  Respiratory:  Respirations regular and unlabored, clear to auscultation bilaterally. GI: Soft, nontender, nondistended, BS + x 4. MS: no deformity or atrophy. Skin: warm and dry, no rash. Neuro:  Strength and sensation are intact. Psych: Normal affect.  Accessory Clinical Findings    Recent Labs: 11/19/2020: ALT 65 11/27/2020: Magnesium 1.7 12/06/2020: BUN 14; Creatinine, Ser 1.04; Hemoglobin 8.0; Platelets 660; Potassium 4.2; Sodium 135   Recent Lipid Panel    Component Value Date/Time   CHOL 123 11/18/2020 0112   TRIG 115 11/18/2020 0112   HDL 53 11/18/2020 0112   CHOLHDL 2.3 11/18/2020 0112   VLDL 23 11/18/2020 0112   LDLCALC 47 11/18/2020 0112   LDLCALC 75 07/21/2019 1110    ECG personally reviewed by me today- *** - No acute changes  TTE 11/17/2020 IMPRESSIONS    1. Left ventricular ejection fraction, by estimation, is 45 to 50%. The  left ventricle has mildly decreased function. The left ventricle  demonstrates regional wall motion abnormalities (see scoring  diagram/findings for description). There is hypokinesis  of the anterior and anteroseptal LV wall segments. There is moderate  concentric left ventricular hypertrophy. Left ventricular diastolic   parameters are consistent with Grade I diastolic dysfunction (impaired  relaxation).  2. Right ventricular systolic function is normal. The right ventricular  size is normal. There is normal pulmonary artery systolic pressure.  3. Moderate pleural effusion in the left lateral region.  4. The mitral valve is normal in structure. Trivial mitral valve  regurgitation.  5. The aortic valve is tricuspid. There is mild calcification of the  aortic valve. There is mild thickening of the aortic valve. Aortic valve  regurgitation is mild. Mild aortic valve sclerosis is present, with no  evidence of aortic valve stenosis.  6. The inferior vena cava is normal in size  with greater than 50%  respiratory variability, suggesting right atrial pressure of 3 mmHg.  7. There is mildly reduced LVEF with anterior and anteroseptal wall  motion abnormalities concerning for possible LAD lesion.   Transesophageal echocardiogram 11/19/2020 IMPRESSIONS    1. Left ventricular ejection fraction, by estimation, is 45 to 50%. The  left ventricle has mildly decreased function.  2. Right ventricular systolic function is normal. The right ventricular  size is normal.  3. No left atrial/left atrial appendage thrombus was detected.  4. The mitral valve is grossly normal. Trivial mitral valve  regurgitation.  5. The aortic valve is normal in structure. Aortic valve regurgitation is  mild. No aortic stenosis is present.  6. There is mild (Grade II) atheroma plaque involving the transverse  aorta.  7. Agitated saline contrast bubble study was positive with shunting  observed after >6 cardiac cycles suggestive of intrapulmonary shunting.   Conclusion(s)/Recommendation(s): No LA/LAA thrombus identified. Negative  bubble study for interatrial shunt. No intracardiac source of embolism  detected on this on this transesophageal echocardiogram.   Assessment & Plan   1.  Cardiomyopathy with mildly reduced EF-  euvolemic today.  No increased DOE or activity intolerance.  Admitted to the hospital 11/18/2020 with altered mental status.  Positive for cocaine.  No plans for ischemic evaluation.  Acute CVA. Continue aspirin Heart healthy low-sodium diet-salty 6 given Increase physical activity as tolerated  NSVT-heart rate today***.  Recent admission with positive UDS for cocaine.  Also known alcohol abuse. Continue to monitor Heart healthy low-sodium diet-salty 6 given Increase physical activity as tolerated  Essential hypertension-BP today Continue to monitor. Heart healthy low-sodium diet-salty 6 given Increase physical activity as tolerated Maintain blood pressure log  AKI-1.04 on 12/06/2020.  Felt to be secondary to prerenal and intrinsic etiologies with concern for hypotension leading to ATN. Follows with PCP  Disposition: Follow-up with Dr. Gardiner Rhyme in 3 months.  Jossie Ng. Aylana Hirschfeld NP-C    12/09/2020, 6:50 AM [place] [address] Office [phone] Fax [phone]  Notice: This dictation was prepared with Dragon dictation along with smaller phrase technology. Any transcriptional errors that result from this process are unintentional and may not be corrected upon review.  I spent***minutes examining this patient, reviewing medications, and using patient centered shared decision making involving her cardiac care.  Prior to her visit I spent greater than 20 minutes reviewing her past medical history,  medications, and prior cardiac tests.

## 2021-01-05 DEATH — deceased

## 2021-03-03 ENCOUNTER — Other Ambulatory Visit (HOSPITAL_BASED_OUTPATIENT_CLINIC_OR_DEPARTMENT_OTHER): Payer: Self-pay
